# Patient Record
Sex: Female | Born: 1945 | Race: White | Hispanic: No | Smoking: Former smoker
Health system: Southern US, Community
[De-identification: ages and names within clinical notes are randomized; demographics above are authoritative.]

## PROBLEM LIST (undated history)

## (undated) DIAGNOSIS — I219 Acute myocardial infarction, unspecified: Secondary | ICD-10-CM

## (undated) DIAGNOSIS — I4891 Unspecified atrial fibrillation: Secondary | ICD-10-CM

## (undated) DIAGNOSIS — I255 Ischemic cardiomyopathy: Secondary | ICD-10-CM

## (undated) DIAGNOSIS — I739 Peripheral vascular disease, unspecified: Secondary | ICD-10-CM

## (undated) DIAGNOSIS — Z950 Presence of cardiac pacemaker: Secondary | ICD-10-CM

## (undated) DIAGNOSIS — E119 Type 2 diabetes mellitus without complications: Secondary | ICD-10-CM

## (undated) DIAGNOSIS — I5022 Chronic systolic (congestive) heart failure: Secondary | ICD-10-CM

## (undated) DIAGNOSIS — IMO0002 Reserved for concepts with insufficient information to code with codable children: Secondary | ICD-10-CM

## (undated) DIAGNOSIS — I2510 Atherosclerotic heart disease of native coronary artery without angina pectoris: Secondary | ICD-10-CM

## (undated) DIAGNOSIS — I10 Essential (primary) hypertension: Secondary | ICD-10-CM

## (undated) DIAGNOSIS — E785 Hyperlipidemia, unspecified: Secondary | ICD-10-CM

## (undated) DIAGNOSIS — E039 Hypothyroidism, unspecified: Secondary | ICD-10-CM

## (undated) DIAGNOSIS — K635 Polyp of colon: Secondary | ICD-10-CM

## (undated) HISTORY — PX: APPENDECTOMY: SHX54

## (undated) HISTORY — DX: Hypothyroidism, unspecified: E03.9

## (undated) HISTORY — DX: Presence of cardiac pacemaker: Z95.0

## (undated) HISTORY — PX: TONSILLECTOMY: SUR1361

## (undated) HISTORY — DX: Unspecified atrial fibrillation: I48.91

## (undated) HISTORY — PX: CARDIAC DEFIBRILLATOR PLACEMENT: SHX171

## (undated) HISTORY — DX: Atherosclerotic heart disease of native coronary artery without angina pectoris: I25.10

## (undated) HISTORY — DX: Type 2 diabetes mellitus without complications: E11.9

## (undated) HISTORY — DX: Polyp of colon: K63.5

## (undated) HISTORY — DX: Acute myocardial infarction, unspecified: I21.9

## (undated) HISTORY — PX: OTHER SURGICAL HISTORY: SHX169

## (undated) HISTORY — DX: Peripheral vascular disease, unspecified: I73.9

## (undated) HISTORY — DX: Hyperlipidemia, unspecified: E78.5

## (undated) HISTORY — DX: Reserved for concepts with insufficient information to code with codable children: IMO0002

## (undated) HISTORY — DX: Essential (primary) hypertension: I10

## (undated) HISTORY — DX: Chronic systolic (congestive) heart failure: I50.22

## (undated) HISTORY — DX: Ischemic cardiomyopathy: I25.5

---

## 1998-07-19 HISTORY — PX: FLEXIBLE SIGMOIDOSCOPY: SHX1649

## 1999-01-21 ENCOUNTER — Ambulatory Visit (HOSPITAL_COMMUNITY): Admission: RE | Admit: 1999-01-21 | Discharge: 1999-01-22 | Payer: Self-pay | Admitting: *Deleted

## 1999-04-14 HISTORY — PX: CORONARY ARTERY BYPASS GRAFT: SHX141

## 2000-07-26 ENCOUNTER — Encounter: Payer: Self-pay | Admitting: Family Medicine

## 2000-07-26 ENCOUNTER — Ambulatory Visit (HOSPITAL_COMMUNITY): Admission: RE | Admit: 2000-07-26 | Discharge: 2000-07-26 | Payer: Self-pay | Admitting: Family Medicine

## 2000-10-13 ENCOUNTER — Encounter: Payer: Self-pay | Admitting: Family Medicine

## 2000-10-13 ENCOUNTER — Ambulatory Visit (HOSPITAL_COMMUNITY): Admission: RE | Admit: 2000-10-13 | Discharge: 2000-10-13 | Payer: Self-pay | Admitting: Family Medicine

## 2001-09-16 ENCOUNTER — Observation Stay (HOSPITAL_COMMUNITY): Admission: EM | Admit: 2001-09-16 | Discharge: 2001-09-17 | Payer: Self-pay | Admitting: Family Medicine

## 2001-09-16 ENCOUNTER — Encounter: Payer: Self-pay | Admitting: Emergency Medicine

## 2005-02-25 ENCOUNTER — Ambulatory Visit: Payer: Self-pay | Admitting: Orthopedic Surgery

## 2005-03-18 ENCOUNTER — Ambulatory Visit: Payer: Self-pay | Admitting: Orthopedic Surgery

## 2005-05-06 ENCOUNTER — Encounter (HOSPITAL_COMMUNITY): Admission: RE | Admit: 2005-05-06 | Discharge: 2005-06-05 | Payer: Self-pay | Admitting: Endocrinology

## 2006-01-12 ENCOUNTER — Ambulatory Visit (HOSPITAL_COMMUNITY): Admission: RE | Admit: 2006-01-12 | Discharge: 2006-01-12 | Payer: Self-pay | Admitting: Family Medicine

## 2006-02-20 ENCOUNTER — Emergency Department (HOSPITAL_COMMUNITY): Admission: EM | Admit: 2006-02-20 | Discharge: 2006-02-20 | Payer: Self-pay | Admitting: Emergency Medicine

## 2006-03-17 ENCOUNTER — Ambulatory Visit: Payer: Self-pay | Admitting: Cardiology

## 2006-03-17 ENCOUNTER — Inpatient Hospital Stay (HOSPITAL_COMMUNITY): Admission: EM | Admit: 2006-03-17 | Discharge: 2006-03-19 | Payer: Self-pay | Admitting: Emergency Medicine

## 2006-03-23 ENCOUNTER — Ambulatory Visit: Payer: Self-pay | Admitting: Cardiology

## 2006-03-23 ENCOUNTER — Inpatient Hospital Stay (HOSPITAL_BASED_OUTPATIENT_CLINIC_OR_DEPARTMENT_OTHER): Admission: RE | Admit: 2006-03-23 | Discharge: 2006-03-23 | Payer: Self-pay | Admitting: Cardiology

## 2006-03-26 ENCOUNTER — Ambulatory Visit: Payer: Self-pay | Admitting: Cardiology

## 2006-04-01 ENCOUNTER — Ambulatory Visit: Payer: Self-pay | Admitting: Internal Medicine

## 2006-04-27 ENCOUNTER — Ambulatory Visit: Payer: Self-pay | Admitting: Cardiology

## 2006-06-07 ENCOUNTER — Ambulatory Visit: Payer: Self-pay | Admitting: Cardiology

## 2006-06-07 LAB — CONVERTED CEMR LAB
Calcium: 9.7 mg/dL (ref 8.4–10.5)
Chloride: 101 meq/L (ref 96–112)
Creatinine, Ser: 0.6 mg/dL (ref 0.4–1.2)
GFR calc Af Amer: 132 mL/min
Glucose, Bld: 191 mg/dL — ABNORMAL HIGH (ref 70–99)
Potassium: 4.5 meq/L (ref 3.5–5.1)
Sodium: 142 meq/L (ref 135–145)

## 2006-07-01 ENCOUNTER — Encounter: Payer: Self-pay | Admitting: Cardiology

## 2006-07-01 ENCOUNTER — Ambulatory Visit: Payer: Self-pay

## 2006-07-01 ENCOUNTER — Ambulatory Visit: Payer: Self-pay | Admitting: Cardiology

## 2006-07-01 LAB — CONVERTED CEMR LAB
CO2: 30 meq/L (ref 19–32)
Chloride: 100 meq/L (ref 96–112)
GFR calc Af Amer: 110 mL/min
GFR calc non Af Amer: 91 mL/min
Potassium: 4.1 meq/L (ref 3.5–5.1)
Sodium: 137 meq/L (ref 135–145)

## 2006-07-08 ENCOUNTER — Ambulatory Visit: Payer: Self-pay | Admitting: Internal Medicine

## 2006-10-14 ENCOUNTER — Ambulatory Visit: Payer: Self-pay | Admitting: Cardiology

## 2007-05-15 ENCOUNTER — Encounter: Payer: Self-pay | Admitting: Emergency Medicine

## 2007-05-16 ENCOUNTER — Ambulatory Visit: Payer: Self-pay | Admitting: Cardiovascular Disease

## 2007-05-16 ENCOUNTER — Encounter: Payer: Self-pay | Admitting: Cardiology

## 2007-05-16 ENCOUNTER — Inpatient Hospital Stay (HOSPITAL_COMMUNITY): Admission: EM | Admit: 2007-05-16 | Discharge: 2007-05-18 | Payer: Self-pay | Admitting: Cardiology

## 2007-05-25 ENCOUNTER — Ambulatory Visit: Payer: Self-pay

## 2007-06-21 ENCOUNTER — Ambulatory Visit: Payer: Self-pay | Admitting: Cardiology

## 2007-06-22 LAB — CONVERTED CEMR LAB
Basophils Relative: 1.7 % — ABNORMAL HIGH (ref 0.0–1.0)
CO2: 29 meq/L (ref 19–32)
Calcium: 9.5 mg/dL (ref 8.4–10.5)
Chloride: 105 meq/L (ref 96–112)
Eosinophils Absolute: 0.4 10*3/uL (ref 0.0–0.6)
MCV: 90.6 fL (ref 78.0–100.0)
Neutro Abs: 3.5 10*3/uL (ref 1.4–7.7)
Neutrophils Relative %: 53.4 % (ref 43.0–77.0)
Potassium: 4.3 meq/L (ref 3.5–5.1)
RDW: 12.7 % (ref 11.5–14.6)
WBC: 6.7 10*3/uL (ref 4.5–10.5)

## 2007-10-04 ENCOUNTER — Ambulatory Visit: Payer: Self-pay | Admitting: Internal Medicine

## 2007-10-25 ENCOUNTER — Ambulatory Visit: Payer: Self-pay | Admitting: Cardiology

## 2007-10-25 LAB — CONVERTED CEMR LAB
Basophils Relative: 0 % (ref 0.0–1.0)
Calcium: 9.7 mg/dL (ref 8.4–10.5)
Eosinophils Relative: 3.3 % (ref 0.0–5.0)
GFR calc Af Amer: 94 mL/min
HCT: 37.1 % (ref 36.0–46.0)
Lymphocytes Relative: 22 % (ref 12.0–46.0)
MCHC: 34.6 g/dL (ref 30.0–36.0)
MCV: 90.5 fL (ref 78.0–100.0)
Monocytes Relative: 4.7 % (ref 3.0–12.0)
Neutro Abs: 6.2 10*3/uL (ref 1.4–7.7)
Platelets: 279 10*3/uL (ref 150–400)
Pro B Natriuretic peptide (BNP): 124 pg/mL — ABNORMAL HIGH (ref 0.0–100.0)
RDW: 12.8 % (ref 11.5–14.6)

## 2008-01-19 ENCOUNTER — Ambulatory Visit: Payer: Self-pay | Admitting: Cardiology

## 2008-04-04 DIAGNOSIS — I10 Essential (primary) hypertension: Secondary | ICD-10-CM | POA: Insufficient documentation

## 2008-04-04 DIAGNOSIS — E785 Hyperlipidemia, unspecified: Secondary | ICD-10-CM

## 2008-04-04 DIAGNOSIS — E039 Hypothyroidism, unspecified: Secondary | ICD-10-CM

## 2008-04-04 DIAGNOSIS — E1159 Type 2 diabetes mellitus with other circulatory complications: Secondary | ICD-10-CM | POA: Insufficient documentation

## 2008-05-03 ENCOUNTER — Ambulatory Visit: Payer: Self-pay | Admitting: Cardiology

## 2008-05-03 ENCOUNTER — Encounter: Payer: Self-pay | Admitting: Internal Medicine

## 2008-05-14 ENCOUNTER — Ambulatory Visit: Payer: Self-pay | Admitting: Cardiology

## 2008-05-14 ENCOUNTER — Ambulatory Visit: Payer: Self-pay

## 2008-05-14 ENCOUNTER — Encounter: Payer: Self-pay | Admitting: Cardiology

## 2008-05-14 LAB — CONVERTED CEMR LAB
ALT: 17 units/L (ref 0–35)
Albumin: 3.9 g/dL (ref 3.5–5.2)
Basophils Absolute: 0.1 10*3/uL (ref 0.0–0.1)
Basophils Relative: 1.4 % (ref 0.0–3.0)
Bilirubin, Direct: 0.1 mg/dL (ref 0.0–0.3)
CO2: 31 meq/L (ref 19–32)
Cholesterol: 143 mg/dL (ref 0–200)
Creatinine, Ser: 0.7 mg/dL (ref 0.4–1.2)
Eosinophils Relative: 5 % (ref 0.0–5.0)
Glucose, Bld: 143 mg/dL — ABNORMAL HIGH (ref 70–99)
Hgb A1c MFr Bld: 8.8 % — ABNORMAL HIGH (ref 4.6–6.0)
LDL Cholesterol: 68 mg/dL (ref 0–99)
MCV: 91.2 fL (ref 78.0–100.0)
Monocytes Absolute: 0.5 10*3/uL (ref 0.1–1.0)
Platelets: 295 10*3/uL (ref 150–400)
Potassium: 4 meq/L (ref 3.5–5.1)
Pro B Natriuretic peptide (BNP): 193 pg/mL — ABNORMAL HIGH (ref 0.0–100.0)
RDW: 13.7 % (ref 11.5–14.6)
Sodium: 139 meq/L (ref 135–145)
Total CHOL/HDL Ratio: 3.7
Triglycerides: 178 mg/dL — ABNORMAL HIGH (ref 0–149)
WBC: 6.5 10*3/uL (ref 4.5–10.5)

## 2008-05-29 ENCOUNTER — Ambulatory Visit: Payer: Self-pay | Admitting: Endocrinology

## 2008-05-29 DIAGNOSIS — M79609 Pain in unspecified limb: Secondary | ICD-10-CM | POA: Insufficient documentation

## 2008-06-28 ENCOUNTER — Ambulatory Visit: Payer: Self-pay | Admitting: Endocrinology

## 2008-07-17 ENCOUNTER — Encounter (INDEPENDENT_AMBULATORY_CARE_PROVIDER_SITE_OTHER): Payer: Self-pay | Admitting: *Deleted

## 2008-07-31 ENCOUNTER — Ambulatory Visit: Payer: Self-pay | Admitting: Endocrinology

## 2008-07-31 LAB — CONVERTED CEMR LAB
Cholesterol: 142 mg/dL (ref 0–200)
Direct LDL: 60.5 mg/dL
HDL: 31.6 mg/dL — ABNORMAL LOW (ref >39.00)

## 2008-09-17 ENCOUNTER — Ambulatory Visit: Payer: Self-pay | Admitting: Endocrinology

## 2008-10-17 ENCOUNTER — Encounter: Payer: Self-pay | Admitting: Internal Medicine

## 2008-10-17 ENCOUNTER — Ambulatory Visit: Payer: Self-pay | Admitting: Internal Medicine

## 2008-10-17 DIAGNOSIS — I429 Cardiomyopathy, unspecified: Secondary | ICD-10-CM | POA: Insufficient documentation

## 2008-10-20 ENCOUNTER — Emergency Department (HOSPITAL_COMMUNITY): Admission: EM | Admit: 2008-10-20 | Discharge: 2008-10-21 | Payer: Self-pay | Admitting: Emergency Medicine

## 2008-10-23 ENCOUNTER — Encounter: Payer: Self-pay | Admitting: Endocrinology

## 2008-10-25 ENCOUNTER — Encounter (INDEPENDENT_AMBULATORY_CARE_PROVIDER_SITE_OTHER): Payer: Self-pay | Admitting: *Deleted

## 2008-10-26 ENCOUNTER — Encounter: Payer: Self-pay | Admitting: Endocrinology

## 2008-11-13 ENCOUNTER — Encounter: Payer: Self-pay | Admitting: Endocrinology

## 2008-12-05 DIAGNOSIS — I2510 Atherosclerotic heart disease of native coronary artery without angina pectoris: Secondary | ICD-10-CM

## 2008-12-06 ENCOUNTER — Ambulatory Visit: Payer: Self-pay | Admitting: Cardiology

## 2008-12-06 DIAGNOSIS — Z6835 Body mass index (BMI) 35.0-35.9, adult: Secondary | ICD-10-CM

## 2008-12-23 ENCOUNTER — Encounter: Payer: Self-pay | Admitting: Endocrinology

## 2008-12-24 ENCOUNTER — Telehealth (INDEPENDENT_AMBULATORY_CARE_PROVIDER_SITE_OTHER): Payer: Self-pay | Admitting: *Deleted

## 2008-12-24 ENCOUNTER — Encounter: Payer: Self-pay | Admitting: Endocrinology

## 2008-12-27 ENCOUNTER — Ambulatory Visit (HOSPITAL_COMMUNITY): Admission: RE | Admit: 2008-12-27 | Discharge: 2008-12-27 | Payer: Self-pay | Admitting: Family Medicine

## 2009-01-23 ENCOUNTER — Ambulatory Visit: Payer: Self-pay | Admitting: Vascular Surgery

## 2009-03-21 ENCOUNTER — Ambulatory Visit: Payer: Self-pay | Admitting: Cardiology

## 2009-03-21 ENCOUNTER — Encounter: Payer: Self-pay | Admitting: Internal Medicine

## 2009-04-10 ENCOUNTER — Telehealth (INDEPENDENT_AMBULATORY_CARE_PROVIDER_SITE_OTHER): Payer: Self-pay | Admitting: *Deleted

## 2009-06-05 ENCOUNTER — Ambulatory Visit: Payer: Self-pay | Admitting: Cardiology

## 2009-06-05 DIAGNOSIS — Z950 Presence of cardiac pacemaker: Secondary | ICD-10-CM

## 2009-06-11 ENCOUNTER — Telehealth: Payer: Self-pay | Admitting: Cardiology

## 2009-06-24 ENCOUNTER — Ambulatory Visit: Payer: Self-pay | Admitting: Endocrinology

## 2009-06-24 LAB — CONVERTED CEMR LAB: Hgb A1c MFr Bld: 7.5 % — ABNORMAL HIGH (ref 4.6–6.5)

## 2009-06-25 ENCOUNTER — Telehealth: Payer: Self-pay | Admitting: Endocrinology

## 2009-07-02 LAB — CONVERTED CEMR LAB
CO2: 23 meq/L (ref 19–32)
Calcium: 9.7 mg/dL (ref 8.4–10.5)
Creatinine, Ser: 0.86 mg/dL (ref 0.40–1.20)
Glucose, Bld: 220 mg/dL — ABNORMAL HIGH (ref 70–99)
MCV: 94.2 fL (ref 78.0–100.0)
Platelets: 327 10*3/uL (ref 150–400)
RBC: 4.11 M/uL (ref 3.87–5.11)

## 2009-07-04 ENCOUNTER — Encounter (INDEPENDENT_AMBULATORY_CARE_PROVIDER_SITE_OTHER): Payer: Self-pay | Admitting: *Deleted

## 2009-07-29 ENCOUNTER — Telehealth: Payer: Self-pay | Admitting: Endocrinology

## 2009-08-07 ENCOUNTER — Encounter: Payer: Self-pay | Admitting: Internal Medicine

## 2009-08-07 ENCOUNTER — Ambulatory Visit: Payer: Self-pay | Admitting: Cardiology

## 2009-09-15 ENCOUNTER — Encounter: Payer: Self-pay | Admitting: Endocrinology

## 2009-10-08 ENCOUNTER — Telehealth: Payer: Self-pay | Admitting: Endocrinology

## 2009-10-29 ENCOUNTER — Ambulatory Visit: Payer: Self-pay | Admitting: Internal Medicine

## 2009-11-26 ENCOUNTER — Encounter: Payer: Self-pay | Admitting: Endocrinology

## 2009-12-02 ENCOUNTER — Telehealth: Payer: Self-pay | Admitting: Endocrinology

## 2009-12-09 ENCOUNTER — Ambulatory Visit: Payer: Self-pay | Admitting: Endocrinology

## 2010-02-13 ENCOUNTER — Telehealth: Payer: Self-pay | Admitting: Endocrinology

## 2010-05-04 ENCOUNTER — Encounter: Payer: Self-pay | Admitting: Family Medicine

## 2010-05-05 ENCOUNTER — Encounter: Payer: Self-pay | Admitting: Family Medicine

## 2010-05-11 LAB — CONVERTED CEMR LAB
Basophils Absolute: 0.1 10*3/uL (ref 0.0–0.1)
Basophils Relative: 0.9 % (ref 0.0–3.0)
CO2: 31 meq/L (ref 19–32)
Creatinine, Ser: 0.7 mg/dL (ref 0.4–1.2)
Eosinophils Absolute: 0.2 10*3/uL (ref 0.0–0.7)
Eosinophils Relative: 2.8 % (ref 0.0–5.0)
GFR calc non Af Amer: 90.27 mL/min (ref >60)
Lymphocytes Relative: 26.3 % (ref 12.0–46.0)
MCV: 91.2 fL (ref 78.0–100.0)
Potassium: 4.5 meq/L (ref 3.5–5.1)
RBC: 3.92 M/uL (ref 3.87–5.11)
RDW: 13 % (ref 11.5–14.6)

## 2010-05-13 NOTE — Assessment & Plan Note (Signed)
Summary: f59m  Medications Added FENOFIBRATE 160 MG TABS (FENOFIBRATE) take one tablet at bedtime MULTIVITAMINS  TABS (MULTIPLE VITAMIN) take one tablet once daily      Allergies Added: NKDA  Referring Provider:  Dr. Gerda Diss Primary Provider:  Dr. Lubertha South  CC:  follow up 6 months.  Pt feeling pretty well.  Has questions about Naproxen Sodium.  Wants to know if this is safe for her to take.  [name] [name]  History of Present Illness: The patient is 65 years old and return for management of CAD and CHF. She had bypass surgery in 2001 at [facility]. In 2009 she presented with congestive heart failure and was found to have an ejection fraction of 25%. She had catheterization which showed patent grafts. She then underwent implantation of an ICD and biventricular pacemaker. Her ejection fraction improved from 25% to 40-45%.  She has done well over the last 6 months. She's had no chest pain shortness of breath or palpitations.  Her past medical history is significant for insulin insulin-dependent diabetes, hyperlipidemia and excess weight. Her weight was 255 pounds today.    Current Medications (verified): 1)  Furosemide 40 Mg Tabs (Furosemide) .... Take 1 Two Times A Day 2)  Lantus 100 Unit/ml Soln (Insulin Glargine) .... Take 30 Units Qhs 3)  Humalog 100 Unit/ml Soln (Insulin Lispro (Human)) .... As Directed 4)  Synthroid 100 Mcg Tabs (Levothyroxine Sodium) .... Take 1 By Mouth Qd 5)  Coreg 25 Mg Tabs (Carvedilol) .... Take 1 Two Times A Day 6)  Klor-Con M20 20 Meq Cr-Tabs (Potassium Chloride Crys Cr) .... Take 1 Two Times A Day 7)  Enalapril Maleate 10 Mg Tabs (Enalapril Maleate) .... Take 1 Two Times A Day 8)  Simvastatin 80 Mg Tabs (Simvastatin) .... Qhs 9)  Aspirin 81 Mg Tbec (Aspirin) .... Take One Tablet By Mouth Daily 10)  Fish Oil   Oil (Fish Oil) .... Once Daily 11)  Vitamin E 600 Unit  Caps (Vitamin E) .... Once Daily 12)  Flax   Oil (Flaxseed (Linseed)) .... Once Daily 13)   Fenofibrate 160 Mg Tabs (Fenofibrate) .... Take One Tablet At Bedtime 14)  Multivitamins  Tabs (Multiple Vitamin) .... Take One Tablet Once Daily  Allergies (verified): No Known Drug Allergies  Past History:  Past Medical History: Reviewed history from 06/05/2009 and no changes required. Congestive heart failure Diabetes mellitus, type II Hyperlipidemia Hypertension Hypothyroidism Peripheral vascular disease 1. Coronary artery disease, status post coronary artery bypass graft     surgery in 2001 with patent grafts in 2007. 2. Ischemic cardiomyopathy, ejection fraction of 25% improved to 45-     50% following biventricular pacing. 3. Class I-II congestive heart failure, currently euvolemic. 4. Status post biventricular dual-chamber pacemaker and implantable     cardioverter-defibrillator implantation.-[employer] Livian H22 5. Insulin-dependent diabetes. 6. Hyperlipidemia.   Review of Systems       ROS is negative except as outlined in HPI.   Vital Signs:  Patient profile:   65 year old female Height:      65 inches Weight:      257 pounds BMI:     42.92 Pulse rate:   79 / minute Pulse rhythm:   irregular BP sitting:   119 / 57  (left arm) Cuff size:   large  Vitals Entered By: Judithe Modest CMA (June 05, 2009 4:20 PM)  Physical Exam  Additional Exam:  Gen. Well-nourished, in no distress   Neck: JVP 1 cm above the  clavicle, thyroid not enlarged, no carotid bruits Lungs: No tachypnea, clear without rales, rhonchi or wheezes Cardiovascular: Rhythm regular, PMI not displaced,  heart sounds  normal, grade 2/6 systolic murmur at the left sternal edge, trace to 1+ lower extremity edema, pulses normal in all 4 extremities. Abdomen: BS normal, abdomen soft and non-tender without masses or organomegaly, no hepatosplenomegaly. MS: No deformities, no cyanosis or clubbing   Neuro:  No focal sns   Skin:  no lesions     ICD Specifications Following MD:  Lewayne Bunting, MD     ICD Vendor:  [facility] [employer]     ICD Model Number:  H220     ICD Serial Number:  [id number] ICD DOI:  05/16/2007     ICD Implanting MD:  Lewayne Bunting, MD  Lead 1:    Location: RA     DOI: 05/16/2007     Model #: [id number]     Serial #: [id number]     Status: active Lead 2:    Location: RV     DOI: 05/16/2007     Model #: [id number]     Serial #: [id number]     Status: active Lead 3:    Location: LV     DOI: 05/16/2007     Model #: [id number]     Serial #: [id number]     Status: active  Indications::  CHF, ICM   ICD Follow Up ICD Dependent:  Yes      Episodes Coumadin:  No  Brady Parameters Mode 175     Lower Rate Limit:  60     Upper Rate Limit 120 PAV 180      Tachy Zones VF:  240     VT:  200     VT1:  175     Impression & Recommendations:  Problem # 1:  CHRONIC SYSTOLIC HEART FAILURE (ICD-428.22) She has a history of systolic heart failure but her ejection fraction improved from 25% to 40-45% following biventricular pacing. She has mild volume overload today with slight JVD and slight edema. We will increase her Lasix to 80 mg a.m. and 40 mg p.m. for 2 days and then resume 40 mg b.i.d. We will get a BMP. Her updated medication list for this problem includes:    Furosemide 40 Mg Tabs (Furosemide) .[name] [name]... Take 1 two times a day    Coreg 25 Mg Tabs (Carvedilol) .[name] [name]... Take 1 two times a day    Enalapril Maleate 10 Mg Tabs (Enalapril maleate) .[name] [name]... Take 1 two times a day    Aspirin 81 Mg Tbec (Aspirin) .[name] [name]... Take one tablet by mouth daily  Problem # 2:  CAD, AUTOLOGOUS BYPASS GRAFT (ICD-414.02)  She had bypass surgery and 2001 and had patent grafts in 2009. She's had no chest pain his palm appears stable. Her updated medication list for this problem includes:    Coreg 25 Mg Tabs (Carvedilol) .[name] [name]... Take 1 two times a day    Enalapril Maleate 10 Mg Tabs (Enalapril maleate) .[name] [name]... Take 1 two times a day    Aspirin 81 Mg Tbec (Aspirin) .[name] [name]... Take one tablet by mouth daily  Her updated medication list  for this problem includes:    Coreg 25 Mg Tabs (Carvedilol) .[name] [name]... Take 1 two times a day    Enalapril Maleate 10 Mg Tabs (Enalapril maleate) .[name] [name]... Take 1 two times a day    Aspirin 81 Mg Tbec (Aspirin) .[name] [name]... Take one tablet by mouth daily  Orders: EKG  w/ Interpretation (93000)  Problem # 3:  AUTOMATIC IMPLANTABLE CARDIAC DEFIBRILLATOR SITU (ICD-V45.02) She is scheduled to have her pacemaker checked next month by EP.  Problem # 4:  MORBID OBESITY (ICD-278.01) She was considering a lap band surgery but found that Medicaid would not pay for this. She plans to pursue this further. This might be most helpful for her.  Patient Instructions: 1)  Your physician recommends that you have  lab work on the next couple of days: bmet/cbc (428.22;414.01) 2)  Your physician has recommended you make the following change in your medication: 1) Increase lasix to 40mg  two tabs in the morning and one tab in the evening for the next 2 days, then return to your current lasix dose. 3)  Your physician wants you to follow-up in: 6 months.  You will receive a reminder letter in the mail two months in advance. If you don't receive a letter, please call our office to schedule the follow-up appointment.

## 2010-05-13 NOTE — Assessment & Plan Note (Signed)
Summary: f/u appt per pt/#/cd   Vital Signs:  Patient profile:   65 year old female Height:      65 inches (165.10 cm) Weight:      258.38 pounds (117.45 kg) O2 Sat:      97 % on Room air Temp:     97.1 degrees F (36.17 degrees C) oral Pulse rate:   76 / minute BP sitting:   120 / 68  (left arm) Cuff size:   large  Vitals Entered By: Josph Macho RMA (June 24, 2009 2:36 PM)  O2 Flow:  Room air CC: Follow-up visit/ CF Is Patient Diabetic? Yes   Referring Provider:  Dr. Gerda Diss Primary Provider:  Dr. Lubertha South  CC:  Follow-up visit/ CF.  History of Present Illness: pt says she still has hypoglycemia in the mid-morning.  she says this is due to exertion.  it is highest at hs (200's).   pt states she feels well in general.  Current Medications (verified): 1)  Furosemide 40 Mg Tabs (Furosemide) .... Take 1 Two Times A Day 2)  Lantus 100 Unit/ml Soln (Insulin Glargine) .... Take 30 Units Qhs 3)  Humalog 100 Unit/ml Soln (Insulin Lispro (Human)) .... As Directed 4)  Synthroid 100 Mcg Tabs (Levothyroxine Sodium) .... Take 1 By Mouth Qd 5)  Coreg 25 Mg Tabs (Carvedilol) .... Take 1 Two Times A Day 6)  Klor-Con M20 20 Meq Cr-Tabs (Potassium Chloride Crys Cr) .... Take 1 Two Times A Day 7)  Enalapril Maleate 10 Mg Tabs (Enalapril Maleate) .... Take 1 Two Times A Day 8)  Simvastatin 80 Mg Tabs (Simvastatin) .... Qhs 9)  Aspirin 81 Mg Tbec (Aspirin) .... Take One Tablet By Mouth Daily 10)  Fish Oil   Oil (Fish Oil) .... Once Daily 11)  Vitamin E 600 Unit  Caps (Vitamin E) .... Once Daily 12)  Flax   Oil (Flaxseed (Linseed)) .... Once Daily 13)  Fenofibrate 160 Mg Tabs (Fenofibrate) .... Take One Tablet At Bedtime 14)  Multivitamins  Tabs (Multiple Vitamin) .... Take One Tablet Once Daily  Allergies (verified): No Known Drug Allergies  Past History:  Past Medical History: Last updated: 06/05/2009 Congestive heart failure Diabetes mellitus, type  II Hyperlipidemia Hypertension Hypothyroidism Peripheral vascular disease 1. Coronary artery disease, status post coronary artery bypass graft     surgery in 2001 with patent grafts in 2007. 2. Ischemic cardiomyopathy, ejection fraction of 25% improved to 45-     50% following biventricular pacing. 3. Class I-II congestive heart failure, currently euvolemic. 4. Status post biventricular dual-chamber pacemaker and implantable     cardioverter-defibrillator implantation.-[employer] Livian H22 5. Insulin-dependent diabetes. 6. Hyperlipidemia.   Review of Systems  The patient denies syncope.    Physical Exam  General:  morbidly obese.   Pulses:  dorsalis pedis intact bilat.   Extremities:  no deformity.  no ulcer on the feet.  feet are of normal color and temp.  the right great toe is surgically absent.  the left great toe has onychomycosis.  there is a heave callous on the plantar aspect of the right great toe. trace right pedal edema and trace left pedal edema.   Neurologic:  sensation is intact to touch on the feet.  Additional Exam:  a1c=8.8 last month (pt says was 5.5 last week).  today: Hemoglobin A1C= 7.5 %    Impression & Recommendations:  Problem # 1:  DIABETES MELLITUS, TYPE II (ICD-250.00) Assessment Improved needs slightly increased rx  Medications  Added to Medication List This Visit: 1)  Lantus 100 Unit/ml Soln (Insulin glargine) .... Take 20 units qhs 2)  Humalog 100 Unit/ml Soln (Insulin lispro (human)) .... Three times a day (just before each meal) 60-70-90 units  Other Orders: TLB-A1C / Hgb A1C (Glycohemoglobin) (83036-A1C) Est. Patient Level III ([address])  Patient Instructions: 1)  decrease lantus to 20 units at bedtime 2)  increase humalog to (just before each meal) 60-70-90.  if exercise is anticipated before the next meal, subtract 20 units from that injection.   3)  Please schedule a follow-up appointment in 3 months.   4)  check your blood  sugar 2 times a day.  vary the time of day when you check, between before the 3 meals, and at bedtime.  also check if you have symptoms of your blood sugar being too high or too low.  please keep a record of the readings and bring it to your next appointment here.  please call us sooner if you are having low blood sugar episodes. 5)  (update: i left message on phone-tree:  rx as we discussed)

## 2010-05-13 NOTE — Progress Notes (Signed)
Summary: OV due  Phone Note Outgoing Call Call back at [phone]   Call placed by: Brenton Grills MA,  December 02, 2009 4:43 PM Call placed to: Per MD, Patient Summary of Call: Per MD, pt is due for OV left message for pt to callback appt has been scheduled for 12/09/09 at 1pm. Initial call taken by: Daphane Shepherd,  December 03, 2009 11:02 AM  Follow-up for Phone Call        left message for pt to callback office Follow-up by: Brenton Grills MA,  December 03, 2009 11:00 AM

## 2010-05-13 NOTE — Cardiovascular Report (Signed)
Summary: Office Visit   Office Visit   Imported By: Roderic Ovens 08/27/2009 11:31:31  _____________________________________________________________________  External Attachment:    Type:   Image     Comment:   External Document

## 2010-05-13 NOTE — Assessment & Plan Note (Signed)
Summary: follow up per md-lb   Vital Signs:  Patient profile:   65 year old female Height:      65 inches (165.10 cm) Weight:      264 pounds (120.00 kg) BMI:     44.09 O2 Sat:      97 % on Room air Temp:     96.6 degrees F (35.89 degrees C) oral Pulse rate:   64 / minute BP sitting:   132 / 70  (left arm) Cuff size:   large  Vitals Entered By: Brenton Grills MA (December 09, 2009 1:24 PM)  O2 Flow:  Room air CC: F/U appt/aj Is Patient Diabetic? Yes   Referring Provider:  Dr. Gerda Diss Primary Provider:  Dr. Lubertha South  CC:  F/U appt/aj.  History of Present Illness: pt states she feels well in general.  no cbg record, but states cbg's are highest highest at hs, and in am (200's).  it is lower at other times of day.    Current Medications (verified): 1)  Furosemide 40 Mg Tabs (Furosemide) .... Take 1 Two Times A Day 2)  Lantus 100 Unit/ml Soln (Insulin Glargine) .... Take 20 Units Qhs 3)  Humalog 100 Unit/ml Soln (Insulin Lispro (Human)) .... Three Times A Day (Just Before Each Meal) 60-70-90 Units 4)  Synthroid 100 Mcg Tabs (Levothyroxine Sodium) .... Take 1 By Mouth Qd 5)  Coreg 25 Mg Tabs (Carvedilol) .... Take 1 Two Times A Day 6)  Klor-Con M20 20 Meq Cr-Tabs (Potassium Chloride Crys Cr) .... Take 1 Two Times A Day 7)  Enalapril Maleate 10 Mg Tabs (Enalapril Maleate) .... Take 1 Two Times A Day 8)  Simvastatin 80 Mg Tabs (Simvastatin) .... Qhs 9)  Aspirin 81 Mg Tbec (Aspirin) .... Take One Tablet By Mouth Daily 10)  Fish Oil   Oil (Fish Oil) .... Once Daily 11)  Vitamin E 600 Unit  Caps (Vitamin E) .... Once Daily 12)  Fenofibrate 160 Mg Tabs (Fenofibrate) .... Take One Tablet At Bedtime 13)  Multivitamins  Tabs (Multiple Vitamin) .... Take One Tablet Once Daily  Allergies (verified): 1)  * Clopidogrel  Review of Systems  The patient denies hypoglycemia.    Physical Exam  General:  morbidly obese.  no distress  Pulses:  dorsalis pedis intact bilat.     Extremities:  no deformity.  no ulcer on the feet.  feet are of normal color and temp.  the right great toe is surgically absent.  the left great toe has onychomycosis.  there is a heavy callous on the plantar aspect of the right great toe. trace right pedal edema and 2+ left pedal edema.   Neurologic:  sensation is intact to touch on the feet.  Additional Exam:  outside test results are reviewed:  a1c=8.0   Impression & Recommendations:  Problem # 1:  DIABETES MELLITUS, TYPE II (ICD-250.00) needs increased rx  Medications Added to Medication List This Visit: 1)  Lantus 100 Unit/ml Soln (Insulin glargine) .... Take 10 units at bedtime 2)  Humalog 100 Unit/ml Soln (Insulin lispro (human)) .... Three times a day (just before each meal) 60-70-110 units  Other Orders: Est. Patient Level III (95621)  Patient Instructions: 1)  decrease lantus to 10 units at bedtime 2)  increase humalog to (just before each meal) 60-70-110.  if exercise is anticipated before the next meal, subtract 20 units from that injection.   3)  Please schedule a follow-up appointment in 6 weeks.   4)  check your blood sugar 2 times a day.  vary the time of day when you check, between before the 3 meals, and at bedtime.  also check if you have symptoms of your blood sugar being too high or too low.  please keep a record of the readings and bring it to your next appointment here.  please call us sooner if you are having low blood sugar episodes.

## 2010-05-13 NOTE — Letter (Signed)
Summary: Appointment - Missed  [facility] [place]  [address] [place] [address]   Phone: [phone]  Fax: [phone]     July 04, 2009 MRN: [id number]   [name] [name] [address] [place], [place]  [address]   Dear [name],  Our records indicate you missed your appointment on 07/04/09 [facility]                            It is very important that we reach you to reschedule this appointment. We look forward to participating in your health care needs. Please contact us at the number listed above at your earliest convenience to reschedule this appointment.     Sincerely,    Glass blower/designer

## 2010-05-13 NOTE — Cardiovascular Report (Signed)
Summary: Office Visit   Office Visit   Imported By: Roderic Ovens 04/18/2009 15:18:16  _____________________________________________________________________  External Attachment:    Type:   Image     Comment:   External Document

## 2010-05-13 NOTE — Progress Notes (Signed)
Summary: lost order for lab work   Phone Note Call from Patient Call back at [employer] [phone]   Caller: Patient Reason for Call: Talk to Nurse, Lab or Test Results Details for Reason: pt lost order to have lab work drawn in [place] office.would like another rx sent to her.  Initial call taken by: Lorne Skeens,  June 11, 2009 3:46 PM  Follow-up for Phone Call        I called and left the pt a message that her order for labs is coming to her in the mail.  Follow-up by: Sherri Rad, RN, BSN,  June 11, 2009 4:37 PM

## 2010-05-13 NOTE — Medication Information (Signed)
Summary: [facility] care [facility]  [facility]   Imported By: Lester George 09/18/2009 09:55:53  _____________________________________________________________________  External Attachment:    Type:   Image     Comment:   External Document

## 2010-05-13 NOTE — Assessment & Plan Note (Signed)
Summary: 3 mth f/u per checkout on 08/07/09/tg      Allergies Added:   Referring Provider:  Dr. Gerda Diss Primary Provider:  Dr. Lubertha South   History of Present Illness: [name] [name] returns today for followup.  She is a morbidly obese woman with a h/o an ICM s/p MI, s/p CABG in 2001.  The patient developed CHF and LBBB and is s/p BiV ICD.  She has had difficulty keeping off weight and was interested in pursuing bariatric surgery but could not afford the cost.  She denies c/p.  Her CHF is class 2.  Current Medications (verified): 1)  Furosemide 40 Mg Tabs (Furosemide) .... Take 1 Two Times A Day 2)  Lantus 100 Unit/ml Soln (Insulin Glargine) .... Take 20 Units Qhs 3)  Humalog 100 Unit/ml Soln (Insulin Lispro (Human)) .... Three Times A Day (Just Before Each Meal) 60-70-90 Units 4)  Synthroid 100 Mcg Tabs (Levothyroxine Sodium) .... Take 1 By Mouth Qd 5)  Coreg 25 Mg Tabs (Carvedilol) .... Take 1 Two Times A Day 6)  Klor-Con M20 20 Meq Cr-Tabs (Potassium Chloride Crys Cr) .... Take 1 Two Times A Day 7)  Enalapril Maleate 10 Mg Tabs (Enalapril Maleate) .... Take 1 Two Times A Day 8)  Simvastatin 80 Mg Tabs (Simvastatin) .... Qhs 9)  Aspirin 81 Mg Tbec (Aspirin) .... Take One Tablet By Mouth Daily 10)  Fish Oil   Oil (Fish Oil) .... Once Daily 11)  Vitamin E 600 Unit  Caps (Vitamin E) .... Once Daily 12)  Fenofibrate 160 Mg Tabs (Fenofibrate) .... Take One Tablet At Bedtime 13)  Multivitamins  Tabs (Multiple Vitamin) .... Take One Tablet Once Daily  Allergies (verified): 1)  * Clopidogrel  Past History:  Past Medical History: Last updated: 06/05/2009 Congestive heart failure Diabetes mellitus, type II Hyperlipidemia Hypertension Hypothyroidism Peripheral vascular disease 1. Coronary artery disease, status post coronary artery bypass graft     surgery in 2001 with patent grafts in 2007. 2. Ischemic cardiomyopathy, ejection fraction of 25% improved to 45-     50% following  biventricular pacing. 3. Class I-II congestive heart failure, currently euvolemic. 4. Status post biventricular dual-chamber pacemaker and implantable     cardioverter-defibrillator implantation.-[employer] Livian H22 5. Insulin-dependent diabetes. 6. Hyperlipidemia.   Past Surgical History: Last updated: 06/05/2009 four vessel CABG-2001 Appendectomy Status post bilateral tubal ligation AICD-[employer] Livian  Review of Systems  The patient denies chest pain, syncope, dyspnea on exertion, and peripheral edema.    Vital Signs:  Patient profile:   65 year old female Height:      65 inches Weight:      261 pounds BMI:     43.59 Pulse rate:   69 / minute Resp:     16 per minute BP sitting:   106 / 58  (right arm)  Vitals Entered By: [name] Coy, CNA (October 29, 2009 9:05 AM)  Physical Exam  General:  morbidly obese.   Head:  normocephalic and atraumatic Eyes:  PERRLA/EOM intact; conjunctiva and lids normal. Mouth:  Teeth, gums and palate normal. Oral mucosa normal. Neck:  Neck supple, no JVD. No masses, thyromegaly or abnormal cervical nodes. Chest Wall:  Well healed ICD incision. Lungs:  Rales in the bases but no wheezes, rales, or rhonchi.  No increased work of breathing. Heart:  RRR with normal S1 and S2.  PMI is enlarged and laterally displaced.  No murmur. Abdomen:  Bowel sounds positive; abdomen soft and non-tender without masses, organomegaly,  or hernias noted. No hepatosplenomegaly. Morbidly obese. Msk:  Back normal, normal gait. Muscle strength and tone normal. Pulses:  dorsalis pedis intact bilat.   Neurologic:  sensation is intact to touch on the feet. alert and oriented times 3.     ICD Specifications Following MD:  [name] Bunting, MD     ICD Vendor:  [facility] [employer]     ICD Model Number:  H220     ICD Serial Number:  [id number] ICD DOI:  05/16/2007     ICD Implanting MD:  [name] Bunting, MD  Lead 1:    Location: RA     DOI: 05/16/2007     Model #:  4135     Serial #: [id number]     Status: active Lead 2:    Location: RV     DOI: 05/16/2007     Model #: 0157     Serial #: [id number]     Status: active Lead 3:    Location: LV     DOI: 05/16/2007     Model #: 3086     Serial #: [id number]     Status: active  Indications::  CHF, ICM   ICD Follow Up Remote Check?  No Battery Voltage:  2.94 V     Charge Time:  7.4 seconds     Battery Est. Longevity:  75% Underlying rhythm:  dependent ICD Dependent:  Yes       ICD Device Measurements Atrium:  Amplitude: 2.5 mV, Impedance: 535 ohms, Threshold: 1.4 V at 0.4 msec Right Ventricle:  Impedance: 740 ohms, Threshold: 1.0 V at 0.4 msec Left Ventricle:  Impedance: 722 ohms, Threshold: 0.8 V at 0.5 msec Shock Impedance: 44 ohms   Episodes MS Episodes:  0     Percent Mode Switch:  0     Coumadin:  No Shock:  0     ATP:  0     Nonsustained:  0     Atrial Pacing:  29%     Ventricular Pacing:  90%  Brady Parameters Mode 175     Lower Rate Limit:  60     Upper Rate Limit 120 PAV 180      Tachy Zones VF:  240     VT:  200     VT1:  175     Next Cardiology Appt Due:  01/11/2010 Tech Comments:  No parameter  changes. Device function normal.  ROV 3 months [facility] clinic. [name] Harm, LPN  October 29, 2009 9:22 AM  MD Comments:  Agree with above.  Impression & Recommendations:  Problem # 1:  CHRONIC SYSTOLIC HEART FAILURE (ICD-428.22) her symptoms are class 2.  I have asked her to continue her current meds, maintain a low sodium diet and exercise. Her updated medication list for this problem includes:    Furosemide 40 Mg Tabs (Furosemide) .Marland Kitchen... Take 1 two times a day    Coreg 25 Mg Tabs (Carvedilol) .Marland Kitchen... Take 1 two times a day    Enalapril Maleate 10 Mg Tabs (Enalapril maleate) .Marland Kitchen... Take 1 two times a day    Aspirin 81 Mg Tbec (Aspirin) .Marland Kitchen... Take one tablet by mouth daily  Problem # 2:  AUTOMATIC IMPLANTABLE CARDIAC DEFIBRILLATOR SITU (ICD-V45.02) her device is working normally.  Will recheck in several  months.  Problem # 3:  HYPERTENSION (ICD-401.9) Her pressures are well controlled.  She will continue her meds as below. Her updated medication list for this problem includes:    Furosemide 40  Mg Tabs (Furosemide) .Marland Kitchen... Take 1 two times a day    Coreg 25 Mg Tabs (Carvedilol) .Marland Kitchen... Take 1 two times a day    Enalapril Maleate 10 Mg Tabs (Enalapril maleate) .Marland Kitchen... Take 1 two times a day    Aspirin 81 Mg Tbec (Aspirin) .Marland Kitchen... Take one tablet by mouth daily

## 2010-05-13 NOTE — Procedures (Signed)
Summary: PC2      Allergies Added: * CLOPIDOGREL  Current Medications (verified): 1)  Furosemide 40 Mg Tabs (Furosemide) .... Take 1 Two Times A Day 2)  Lantus 100 Unit/ml Soln (Insulin Glargine) .... Take 20 Units Qhs 3)  Humalog 100 Unit/ml Soln (Insulin Lispro (Human)) .... Three Times A Day (Just Before Each Meal) 60-70-90 Units 4)  Synthroid 100 Mcg Tabs (Levothyroxine Sodium) .... Take 1 By Mouth Qd 5)  Coreg 25 Mg Tabs (Carvedilol) .... Take 1 Two Times A Day 6)  Klor-Con M20 20 Meq Cr-Tabs (Potassium Chloride Crys Cr) .... Take 1 Two Times A Day 7)  Enalapril Maleate 10 Mg Tabs (Enalapril Maleate) .... Take 1 Two Times A Day 8)  Simvastatin 80 Mg Tabs (Simvastatin) .... Qhs 9)  Aspirin 81 Mg Tbec (Aspirin) .... Take One Tablet By Mouth Daily 10)  Fish Oil   Oil (Fish Oil) .... Once Daily 11)  Vitamin E 600 Unit  Caps (Vitamin E) .... Once Daily 12)  Fenofibrate 160 Mg Tabs (Fenofibrate) .... Take One Tablet At Bedtime 13)  Multivitamins  Tabs (Multiple Vitamin) .... Take One Tablet Once Daily  Allergies (verified): 1)  * Clopidogrel    ICD Specifications Following MD:  Lewayne Bunting, MD     ICD Vendor:  [facility] [employer]     ICD Model Number:  H220     ICD Serial Number:  [id number] ICD DOI:  05/16/2007     ICD Implanting MD:  Lewayne Bunting, MD  Lead 1:    Location: RA     DOI: 05/16/2007     Model #: [id number]     Serial #: [id number]     Status: active Lead 2:    Location: RV     DOI: 05/16/2007     Model #: [id number]     Serial #: [id number]     Status: active Lead 3:    Location: LV     DOI: 05/16/2007     Model #: [id number]     Serial #: [id number]     Status: active  Indications::  CHF, ICM   ICD Follow Up Remote Check?  No Battery Voltage:  2.92 V     Charge Time:  7.4 seconds     Battery Est. Longevity:  bol Underlying rhythm:  DEPENDENT ICD Dependent:  Yes       ICD Device Measurements Atrium:  Amplitude: 2.1 mV, Impedance: 535 ohms, Threshold: 1.4 V at 0.4 msec Right Ventricle:   Impedance: 699 ohms, Threshold: 0.8 V at 0.4 msec Left Ventricle:  Impedance: 722 ohms, Threshold: 0.8 V at 0.5 msec Shock Impedance: 43 ohms   Episodes MS Episodes:  0     Percent Mode Switch:  0     Coumadin:  No Shock:  0     ATP:  0     Nonsustained:  0     Atrial Pacing:  16%     Ventricular Pacing:  90%  Brady Parameters Mode 175     Lower Rate Limit:  60     Upper Rate Limit 120 PAV 180      Tachy Zones VF:  240     VT:  200     VT1:  175     Next Cardiology Appt Due:  10/11/2009 Tech Comments:  RA reprogrammed 2.6@0 .4.  ROV 3 months with Dr. Ladona Ridgel in [place]. Altha Harm, LPN  August 07, 2009 10:18 AM  MD Comments:  Agree with above.

## 2010-05-13 NOTE — Progress Notes (Signed)
  Phone Note Refill Request Message from:  Fax from Pharmacy on July 29, 2009 12:50 PM  Refills Requested: Medication #1:  SIMVASTATIN 80 MG TABS qhs   Dosage confirmed as above?Dosage Confirmed Initial call taken by: Josph Macho RMA,  July 29, 2009 12:50 PM    Prescriptions: SIMVASTATIN 80 MG TABS (SIMVASTATIN) qhs  #30 x 5   Entered by:   Josph Macho RMA   Authorized by:   Minus Breeding MD   Signed by:   Josph Macho RMA on 07/29/2009   Method used:   Electronically to        [facility]* (retail)       [address]       [place], [place]  [address]       Ph: [phone] or [phone]       Fax: [phone]   RxID:   [id number]

## 2010-05-13 NOTE — Progress Notes (Signed)
Summary: rx refill req  Phone Note Refill Request Message from:  Fax from Pharmacy on February 13, 2010 10:08 AM  Refills Requested: Medication #1:  HUMALOG 100 UNIT/ML SOLN three times a day (just before each meal) 60-70-110 units   Dosage confirmed as above?Dosage Confirmed   Last Refilled: 01/16/2010  Medication #2:  SYNTHROID 100 MCG TABS take 1 by mouth qd   Dosage confirmed as above?Dosage Confirmed   Last Refilled: 12/13/2009  Medication #3:  SIMVASTATIN 80 MG TABS qhs   Dosage confirmed as above?Dosage Confirmed   Last Refilled: 01/16/2010  Method Requested: Electronic Next Appointment Scheduled: none Initial call taken by: Brenton Grills CMA (AAMA),  February 13, 2010 10:09 AM    Prescriptions: SIMVASTATIN 80 MG TABS (SIMVASTATIN) qhs  #30 x 2   Entered by:   Brenton Grills CMA (AAMA)   Authorized by:   Minus Breeding MD   Signed by:   Brenton Grills CMA (AAMA) on 02/13/2010   Method used:   Electronically to        [facility] (retail)       [address]       [place], [place]  [address]       Ph: [phone] or [phone]       Fax: [phone]   RxID:   [id number] SYNTHROID 100 MCG TABS (LEVOTHYROXINE SODIUM) take 1 by mouth qd  #90 x 1   Entered by:   Brenton Grills CMA (AAMA)   Authorized by:   Minus Breeding MD   Signed by:   Brenton Grills CMA (AAMA) on 02/13/2010   Method used:   Electronically to        [facility] (retail)       [address]       [place], [place]  [address]       Ph: [phone] or [phone]       Fax: [phone]   RxID:   [id number] HUMALOG 100 UNIT/ML SOLN (INSULIN LISPRO (HUMAN)) three times a day (just before each meal) 60-70-110 units  #1 month x 2   Entered by:   Brenton Grills CMA (AAMA)   Authorized by:   Minus Breeding MD   Signed by:   Brenton Grills CMA (AAMA) on 02/13/2010   Method used:   Electronically to        [facility]  (retail)       [address]       [place], [place]  [address]       Ph: [phone] or [phone]       Fax: [phone]   RxID:   [id number]

## 2010-05-13 NOTE — Progress Notes (Signed)
Summary: humalog   Phone Note Refill Request Message from:  Fax from Pharmacy  Refills Requested: Medication #1:  HUMALOG 100 UNIT/ML SOLN three times a day (just before each meal) 60-70-90 units    Prescriptions: HUMALOG 100 UNIT/ML SOLN (INSULIN LISPRO (HUMAN)) three times a day (just before each meal) 60-70-90 units  #1 month x 3   Entered by:   Brenton Grills MA   Authorized by:   Minus Breeding MD   Signed by:   Brenton Grills MA on 10/08/2009   Method used:   Faxed to ...       [facility]* (retail)       [address]       [place], [place]  [address]       Ph: [phone] or [phone]       Fax: [phone]   RxID:   [id number]

## 2010-05-13 NOTE — Progress Notes (Signed)
  Phone Note Refill Request Message from:  Fax from Pharmacy on June 25, 2009 10:43 AM  Refills Requested: Medication #1:  SYNTHROID 100 MCG TABS take 1 by mouth qd   Dosage confirmed as above?Dosage Confirmed Initial call taken by: Josph Macho RMA,  June 25, 2009 10:44 AM    Prescriptions: SYNTHROID 100 MCG TABS (LEVOTHYROXINE SODIUM) take 1 by mouth qd  #90 x 2   Entered by:   Josph Macho RMA   Authorized by:   Minus Breeding MD   Signed by:   Josph Macho RMA on 06/25/2009   Method used:   Electronically to        [facility] (retail)       [address]       [place], [place]  [address]       Ph: [phone] or [phone]       Fax: [phone]   RxID:   [id number]

## 2010-06-30 ENCOUNTER — Telehealth: Payer: Self-pay | Admitting: Endocrinology

## 2010-07-04 ENCOUNTER — Encounter: Payer: Self-pay | Admitting: *Deleted

## 2010-07-09 ENCOUNTER — Other Ambulatory Visit (HOSPITAL_COMMUNITY): Payer: Self-pay | Admitting: Family Medicine

## 2010-07-09 DIAGNOSIS — E049 Nontoxic goiter, unspecified: Secondary | ICD-10-CM

## 2010-07-10 NOTE — Progress Notes (Signed)
Summary: rx refill req  Phone Note Refill Request Message from:  Fax from Pharmacy on June 30, 2010 1:22 PM  Refills Requested: Medication #1:  ENALAPRIL MALEATE 10 MG TABS TAKE 1 two times a day   Dosage confirmed as above?Dosage Confirmed   Last Refilled: 05/05/2010  Method Requested: Electronic Next Appointment Scheduled: none Initial call taken by: Brenton Grills CMA (AAMA),  June 30, 2010 1:22 PM    Prescriptions: SIMVASTATIN 80 MG TABS (SIMVASTATIN) qhs  #30 x 3   Entered by:   Brenton Grills CMA (AAMA)   Authorized by:   Minus Breeding MD   Signed by:   Brenton Grills CMA (AAMA) on 06/30/2010   Method used:   Electronically to        [facility] (retail)       [address]       [place], [place]  [address]       Ph: [phone] or [phone]       Fax: [phone]   RxID:   [id number]

## 2010-07-11 ENCOUNTER — Ambulatory Visit (HOSPITAL_COMMUNITY)
Admission: RE | Admit: 2010-07-11 | Discharge: 2010-07-11 | Disposition: A | Discharge status: Home / Self Care | Payer: Medicare Other | Source: Ambulatory Visit | Attending: Family Medicine | Admitting: Family Medicine

## 2010-07-11 DIAGNOSIS — E049 Nontoxic goiter, unspecified: Secondary | ICD-10-CM | POA: Insufficient documentation

## 2010-07-20 LAB — CBC
HCT: 37 % (ref 36.0–46.0)
Hemoglobin: 12.9 g/dL (ref 12.0–15.0)
MCHC: 35.1 g/dL (ref 30.0–36.0)
RBC: 4.08 MIL/uL (ref 3.87–5.11)

## 2010-07-20 LAB — POCT CARDIAC MARKERS
CKMB, poc: <1 ng/mL — ABNORMAL LOW (ref 1.0–8.0)
Myoglobin, poc: 128 ng/mL (ref 12–200)
Troponin i, poc: <0.05 ng/mL (ref 0.00–0.09)

## 2010-07-20 LAB — COMPREHENSIVE METABOLIC PANEL
ALT: 21 U/L (ref 0–35)
AST: 22 U/L (ref 0–37)
Alkaline Phosphatase: 47 U/L (ref 39–117)
Chloride: 99 mEq/L (ref 96–112)
GFR calc Af Amer: >60 mL/min (ref >60)
Total Bilirubin: 0.6 mg/dL (ref 0.3–1.2)

## 2010-07-20 LAB — DIFFERENTIAL
Lymphocytes Relative: 24 % (ref 12–46)
Lymphs Abs: 2.3 10*3/uL (ref 0.7–4.0)
Monocytes Relative: 9 % (ref 3–12)
Neutro Abs: 6 10*3/uL (ref 1.7–7.7)
Neutrophils Relative %: 63 % (ref 43–77)

## 2010-07-28 ENCOUNTER — Encounter: Payer: Self-pay | Admitting: *Deleted

## 2010-08-04 ENCOUNTER — Other Ambulatory Visit: Payer: Self-pay | Admitting: Endocrinology

## 2010-08-04 MED ORDER — INSULIN LISPRO 100 UNIT/ML ~~LOC~~ SOLN: SUBCUTANEOUS | Status: DC

## 2010-08-04 NOTE — Telephone Encounter (Signed)
R'cd fax from [facility] for refill of  pt's Humalog  Last OV-12/09/2009  Last Filled-05/05/2010

## 2010-08-06 ENCOUNTER — Other Ambulatory Visit: Payer: Self-pay | Admitting: Endocrinology

## 2010-08-06 NOTE — Telephone Encounter (Signed)
[facility] sent fax regarding pt's Humalog rx. Insurance will no longer pay for Humalog and the alternative is [employer] (ie Novolog- Please advise

## 2010-08-06 NOTE — Telephone Encounter (Signed)
Ov is due.  Let's address then 

## 2010-08-07 NOTE — Telephone Encounter (Signed)
Unable to reach pt at number on file (disconnected) to schedule OV.

## 2010-08-08 ENCOUNTER — Encounter: Payer: Self-pay | Admitting: Endocrinology

## 2010-08-08 NOTE — Telephone Encounter (Signed)
Unable to reach pt at number on file (disconnected) will generate letter to sent to pt to schedule OV.

## 2010-08-18 ENCOUNTER — Ambulatory Visit (INDEPENDENT_AMBULATORY_CARE_PROVIDER_SITE_OTHER): Payer: Medicare Other | Admitting: *Deleted

## 2010-08-18 DIAGNOSIS — Z95810 Presence of automatic (implantable) cardiac defibrillator: Secondary | ICD-10-CM

## 2010-08-18 DIAGNOSIS — I5022 Chronic systolic (congestive) heart failure: Secondary | ICD-10-CM

## 2010-08-18 DIAGNOSIS — I2589 Other forms of chronic ischemic heart disease: Secondary | ICD-10-CM

## 2010-08-18 DIAGNOSIS — I509 Heart failure, unspecified: Secondary | ICD-10-CM

## 2010-08-26 NOTE — Assessment & Plan Note (Signed)
[facility]                         ELECTROPHYSIOLOGY OFFICE NOTE   NAME:[name] [name], [name] [name]                     MRN:          [id number]  DATE:10/04/2007                            DOB:          12/26/1945    [name] [name] returns today for followup.  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  She returns today for followup.  She denies chest  pain.  She denies shortness of breath.  She had no specific complaints  today.  Her medications include:  1. Potassium 20 twice [name] day.  2. Synthroid 100 daily.  3. Aspirin 81 [name] day.  4. Coreg 25 twice [name] day.  5. Vytorin 10/40.  6. Actos 50 [name] day.  7. Metformin 500 twice daily.  8. Lasix 40 three times daily.  9. Enalapril five twice daily.  10.Xanax 5 nightly.   PHYSICAL EXAMINATION:  GENERAL:  She is [name] pleasant, obese middle-aged  woman in no distress.  VITAL SIGNS:  Blood pressure 126/55, the pulse 74 and regular, the  respirations were 18, the weight was 240 pounds.  NECK:  Revealed no jugular distention.  LUNGS:  Clear bilaterally to auscultation.  No wheezes, rales, or  rhonchi are present.  CARDIOVASCULAR:  Regular rate and rhythm.  Normal S1 and S2.  EXTREMITIES:  No edema.   Interrogation of the defibrillator demonstrates [name] [name] [name] [employer].  P-waves were 2.9.  R-waves were greater than 25.  The LV  wave was 20, the impedance 559 in the atrium; 700 in the ventricle; and  800 in the left ventricle; threshold 0.8 at 0.4 in both the right  ventricle and the left ventricle.  The atrial threshold was 0.2-0.4.  Battery voltage was 3.04 V.  Underlying rhythm was sinus.  She was 6% [name]  paced and 98% V paced.  Today, we increased her PVARP to allow for  blanking of PVCs getting through into the atrium.   IMPRESSION:  1. Ischemic  cardiomyopathy.  2. Congestive heart failure.  3. Syncope.  4. Status post biventricular implantable cardioverter-defibrillator      insertion secondary to all of the above with severe left      ventricular dysfunction, now improved.   DISCUSSION:  [name] [name] is stable.  Her defibrillator is working  normally.  We will plan to see her back in the office in February 2010.     [name] [name]. [name] Ridgel, MD  Electronically Signed    GWT/MedQ  DD: 10/04/2007  DT: 10/05/2007  Job #: [id number]

## 2010-08-26 NOTE — Assessment & Plan Note (Signed)
[facility]                            CARDIOLOGY OFFICE NOTE   NAME:[name] [name], [name] [name]                     MRN:          [id number]  DATE:05/03/2008                            DOB:          27-Jun-1945    PRIMARY CARE PHYSICIAN:  Donna Bernard, MD   CLINICAL HISTORY:  [name] [name] is 65 years old and returned for followup  management of her coronary heart disease and congestive heart failure.  She had bypass surgery in 2001 at [facility] and developed heart  failure and was found to have an ejection fraction of 25%.  Her grafts  were patent at catheterization.  Subsequently she underwent implantation  of a dual-chamber biventricular pacemaker with ICD.  She has done quite  well since that time and her ejection fraction improved from 25% to 45-  50%.  She said she has had no recent chest pain, shortness of breath, or  palpitations.   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   PAST MEDICAL HISTORY:  Significant for diabetes and hyperlipidemia.   CURRENT MEDICATIONS:  1. Lasix 40 mg t.i.d.  2. KCl 20 mEq b.i.d.  3. Levothyroxine.  4. Aspirin.  5. Multivitamins.  6. Coreg 25 b.i.d.  7. Vytorin 10/40 daily.  8. Actos.  9. Metformin.  10.Lantus.  11.Humulin insulin.  12.Enalapril 5 b.i.d.  13.Xanax.  14.Fish oil.  15.Vitamin E.   PHYSICAL EXAMINATION:  VITAL SIGNS:  Blood pressure is 135/68 and pulse  is 76 and regular.  NECK:  There is no venous distention.  The carotid pulses were full.  CHEST:  Clear.  CARDIAC:  Rhythm is regular.  No murmurs or gallops.  ABDOMEN:  Soft and normal bowel sounds.  EXTREMITIES:  There is trace peripheral edema.  Peripheral pulses are  equal.   Electrocardiogram showed atrial tracking and ventricular pacing.   Interrogated pacemaker showed good threshold.  She was pacing the  ventricle all the time.   IMPRESSION:  1. Coronary artery disease, status post coronary artery bypass graft      surgery in 2001 with patent grafts in 2007.  2. Ischemic cardiomyopathy, ejection fraction of 25% improved to 45-      50% following biventricular pacing.  3. Class I-II congestive heart failure, currently euvolemic.  4. Status post biventricular dual-chamber pacemaker and implantable      cardioverter-defibrillator implantation.  5. Insulin-dependent diabetes.  6. Hyperlipidemia.   RECOMMENDATIONS:  I think Ms. Demers is doing well.  She is not on  optimum doses of her ACE inhibitor, and her blood pressure will permit  higher doses, so we will increase her enalapril from 5 b.i.d. to 10  b.i.d.  As she has no evidence of volume overload, and I think we can  cut her Lasix back from 40 t.i.d. to 40 b.i.d.  We will have her come in  for fasting lab work including a BMP, BNP, CBC, lipid, liver, and  hemoglobin A1c.  I will plan to see her back in followup in 6  months.  She plan to see Dr. Everardo All for the help with management of her  diabetes.     Bruce Elvera Lennox Juanda Chance, MD, [facility]  Electronically Signed    BRB/MedQ  DD: 05/03/2008  DT: 05/04/2008  Job #: [id number]   cc:   Gregary Signs A. Everardo All, MD

## 2010-08-26 NOTE — Assessment & Plan Note (Signed)
[facility]                            CARDIOLOGY OFFICE NOTE   NAME:Cashwell, SURAIYA DICKERSON                     MRN:          [id number]  DATE:10/14/2006                            DOB:          08-Jul-1945    PRIMARY CARE PHYSICIAN:  Donna Bernard, M.D.   CLINICAL HISTORY:  Ms. Hedberg is 65 years old and had bypass surgery in  2001 at [facility] and was admitted last September at [facility] with acute pulmonary edema and found to have an ejection  fraction of 25%.  She underwent catheterization in the outpatient  laboratory and was found to have patent grafts and a wedge of 22.   Since that time, she has done much better.  She has had minimal symptoms  of shortness of breath.  No chest pain and no palpitations.  We had  considered her for an ICD, and she saw Dr. Graciela Husbands, but she has decided  against having this done for the present.   She has gone back to work now, and she works at the [employer] for [employer], helping homeless people find homes.   Her past medical history is significant for diabetes and hyperlipidemia.   CURRENT MEDICATIONS:  1. Potassium.  2. Furosemide 40 mg t.i.d.  3. Coreg 25 mg b.i.d.  4. Fish oil.  5. Vytorin.  6. Enalapril 10 mg b.i.d.  7. Insulin, Humalog and Lantus.  8. Actos/Metformin.   PHYSICAL EXAMINATION:  VITAL SIGNS:  Blood pressure 90/59, pulse 65 and  regular.  There is no venous distention.  NECK:  Carotid pulses were full without bruits.  CHEST:  Clear.  CARDIAC:  Rhythm was regular.  There are no murmurs or gallops.  ABDOMEN:  Soft with normal bowel sounds.  There is no  hepatosplenomegaly.  EXTREMITIES:  There is no peripheral edema.  Pedal pulses are equal.   Electrocardiogram showed left bundle branch block.   IMPRESSION:  1. Coronary artery disease, status post coronary artery bypass graft      surgery at [facility] in 2001.  2. Ischemic cardiomyopathy  with an ejection fraction of 25%.  3. Patent grafts at catheterization in December, 2007.  4. Congestive heart failure due to systolic dysfunction, class II.  5. Insulin-dependent diabetes.  6. Hyperlipidemia.   RECOMMENDATIONS:  I think Ms. Craighead is doing well.  She is decided  against the defibrillator, at least for the present.  I plan to get a  BNP and a CBC and a hemoglobin A1C.  I will see her in six months, and  she will see Dr. Gerda Diss in the interim.     Bruce Elvera Lennox Juanda Chance, MD, [facility]  Electronically Signed    BRB/MedQ  DD: 10/14/2006  DT: 10/14/2006  Job #: [id number]

## 2010-08-26 NOTE — Assessment & Plan Note (Signed)
[facility]                            CARDIOLOGY OFFICE NOTE   NAME:[name], [name]                     MRN:          [id number]  DATE:06/21/2007                            DOB:          February 22, 1946    PRIMARY CARE PHYSICIAN:  [name] [name], M.D.   CLINICAL HISTORY:  [name] [name] is  65 years old, and returns for a  followup visit after a recent hospitalization and bi-V pacer ICD  implantation.  She had bypass surgery in 2001 at [facility] and  subsequently developed acute pulmonary edema and underwent  catheterization last year and was found to have patent grafts and an  ejection fraction of 25%.  She declined an ICD at that time, but did  well on medical therapy.   She then developed symptoms of presyncope and was found to have a Mobitz  type 2 second-degree AV block with significant bradycardia.  She was  hospitalized here and underwent implantation of a bi-V DDD pacemaker  with ICD.  She has done quite well since discharge.  She was seen in  pacer clinic and she was back at work within a couple of days of going  home.  Despite her severe cardiac disease, she works at [employer] for [employer], helping homeless people find homes.   PAST MEDICAL HISTORY:  Her past medical history is significant for  diabetes and hyperlipidemia.   CURRENT MEDICATIONS INCLUDE:  1. K-Dur 20 mEq b.i.d.  2. Levothyroxine.  3. Aspirin.  4. Coreg 25 mg b.i.d.  5. Vytorin 10/40 daily.  6. Actos.  7. Metformin.  8. Lantus insulin.  9. Humalog insulin.  10.Lasix 40 mg t.i.d.  11.Enalapril 5 mg b.i.d.  12.Xanax.  13.Fish oil.   EXAMINATION:  The blood pressure was 110/60 and the pulse 71 and  regular.  There was no venous distention.  The carotid pulses were full without  bruits.  Chest was clear.  The cardiac rhythm was regular, no murmurs or gallops.  The abdomen was soft without organomegaly.  Peripheral pulses were full and  there was no peripheral edema.   An electrocardiogram today showed atrial tracking and ventricular  pacing.   IMPRESSION:  1. Coronary artery disease, status post coronary bypass graft surgery      in 2001 with patent grafts in 2007.  2. Ischemic cardiomyopathy, ejection fraction of 25%.  3. Class 1-2 congestive heart failure, related to systolic      dysfunction.  4. Status post bi-V pacer ICD for type 2 second-degree AV block and      systolic congestive heart failure.  5. Insulin-dependent diabetes.  6. Hyperlipidemia.   RECOMMENDATIONS:  I think [name] [name] is doing extremely well,  especially considering the severity of her disease.  We will get a CBC  and BMP on her today.  She has a stitch at the site of her pocket that  we will clip.  I will see her back in four months.     Bruce Elvera Lennox Juanda Chance, MD, [facility]  Electronically Signed    BRB/MedQ  DD: 06/21/2007  DT: 06/22/2007  Job #: [id number]   cc:   [name] [name], M.D.

## 2010-08-26 NOTE — Assessment & Plan Note (Signed)
[facility]                            CARDIOLOGY OFFICE NOTE   NAME:[name] [name], [name] [name]                     MRN:          [id number]  DATE:10/25/2007                            DOB:          [birth year]    PRIMARY CARE PHYSICIAN:  [name] Bernard, MD   CLINICAL HISTORY:  Ms. Heal is a 65 year old who returned for  followup management of her ischemic cardiomyopathy and congestive heart  failure.  She had bypass surgery in 2001 at [facility] and  subsequently developed heart failure and was found to have an ejection  fraction of 25%.  We did a cardiac catheterization.  All of her grafts  were patent.  She initially declined an ICD, but subsequently developed  AV block and underwent implantation of a dual-chamber and biventricular  pacemaker with ICD.  She has done quite well since that time.  She has  had no recent chest pain, shortness breath, or palpitations.  Her last  echocardiogram, which was done in February 2009 showed an improvement in  ejection fraction to 45-50%.   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  She has  still been doing this on a regular basis.   PAST MEDICAL HISTORY:  Significant for diabetes and hyperlipidemia.   CURRENT MEDICATIONS:  Include K-Dur 20 mEq b.i.d., levothyroxine,  aspirin, multivitamins, Coreg 25 mg b.i.d., Vytorin 10/40 daily, Actos,  metformin, Lantus, Humalog, Lasix 40 mg t.i.d., enalapril 5 mg b.i.d.,  and fish oil.   PHYSICAL EXAMINATION:  The blood pressure is 124/68 and pulse 70 and  regular.  There was no venous distension.  The carotid pulses were full  without bruits.  Chest was clear without rales or rhonchi.  Cardiac  rhythm was regular.  No murmurs or gallops.  The abdomen was soft with  normal bowel sounds.  There was no peripheral edema.  Pedal pulses are  equal.   Electrocardiogram showed  atrial tracking and ventricular pacing.   IMPRESSION:  1. Coronary artery disease, status post coronary artery bypass graft      surgery in 2001 with patent grafts in 2007.  2. Ischemic cardiomyopathy, ejection fraction of 25%, improved to 45-      50% following biventricular pacing.  3. Class I- to II systolic congestive heart failure, not euvolemic.  4. Status post biventricular dual-chamber and biventricular      pacer/implantable cardioverter-defibrillator for high-degree      arteriovenous block.  5. Insulin-dependent diabetes.  6. Hyperlipidemia.   RECOMMENDATIONS:  [name] is doing extremely well.  Her LV function  is recovered extremely well followed biventricular pacer.  We will plan  to continue her with same medications.  We will get a CBC, BMP, and BNP on her today.  I will plan to see her  back in followup in 6 months.     Bruce Elvera Lennox Juanda Chance, MD, [facility]  Electronically Signed    BRB/MedQ  DD: 10/25/2007  DT: 10/26/2007  Job #: [id number]

## 2010-08-26 NOTE — Op Note (Signed)
[name] [name], [name] [name] NO.:  [id number]   MEDICAL RECORD NO.:  [id number]          PATIENT TYPE:  INP   LOCATION:  2314                         FACILITY:  [facility]   PHYSICIAN:  Doylene Canning. Ladona Ridgel, MD    DATE OF BIRTH:  1945/07/23   DATE OF PROCEDURE:  05/16/2007  DATE OF DISCHARGE:                               OPERATIVE REPORT   PROCEDURE PERFORMED:  Implantation of a biventricular ICD with  defibrillation threshold testing.   INTRODUCTION:  The patient is a 65 year old woman with a longstanding  ischemic cardiomyopathy, EF of 25%, who presented to the hospital with  near syncope and 2:1 heart block.  She has class II heart failure,  sometimes class III, and is now referred for a BiV ICD insertion  secondary to her ischemic cardiomyopathy, congestive heart failure, left  bundle branch block and 2:1 heart block requiring continuous pacing.   PROCEDURE:  After informed consent was obtained, the patient was taken  to the diagnostic EP lab in the fasting state.  After the usual  preparation and draping, intravenous fentanyl and midazolam was given  for sedation.  Lidocaine 30 mL was infiltrated in the left  infraclavicular clavicular region.  A 9 cm incision was carried out over  this region.  Electrocautery was utilized to dissect down to the fascial  plane.  The left subclavian vein was punctured x3 and the Guidant  Dextrus model 4135 active fixation pacing lead, serial number [id number],  was advanced to the right atrium.  The Guidant model 0157 9 [place]  active fixation defibrillation lead, serial number [id number], was advanced  to the right ventricle and mapping was then carried out.  At the final  site on the RV septum, the R-waves measured 9 and the impedance 546  ohms, threshold 0.6 volts at 0.5 milliseconds.  A 10-volt pacing did not  stimulate the diaphragm.  With the RV lead in satisfactory position,  attention was then turned to placement of the atrial lead which  was  placed in the anterolateral portion of the right atrium where the P-  waves measured 2.5  mV, impedance 537 ohms and threshold 0.8 volts at  0.5 milliseconds once the lead was actively fixed.  With the right  atrial and right ventricular leads in satisfactory position, attention  was then turned to placement of the LV lead which was placed by way of a  guiding catheter which had been advanced into the right atrium.  A 6-  [place] hexapolar EP catheter was utilized to cannulate the coronary  sinus.  Having done so, venography was carried out in the coronary sinus  demonstrating a a small posterior vein which was quite tortuous and a  smaller high lateral vein which was initially selected for LV placement.  The Guidant Acuity Steerable LV lead, serial number [id number], was advanced  into the lateral vein and at a location approximately one third from  base to apex, the lead was deployed.  In this location, the threshold  was 1.2 volts at 0.5 milliseconds and the pacing impedance was 1190  ohms.  A 10-volt  pacing did not stimulate the diaphragm.  With these  satisfactory parameters, the leads were secured to the subpectoralis  fascia with a figure-of-eight silk suture and the sewing sleeve was also  secured with silk suture.  Electrocautery was utilized to make  subcutaneous pocket.  Kanamycin irrigation was utilized to irrigate the  pocket.  Electrocautery was utilized to assure hemostasis.  The [employer] H220 BiV ICD, serial number [id number], was connected to  the RA, RV and LV leads and placed back in the subcutaneous pocket.  Generator was secured with silk suture.  Additional kanamycin was  utilized to irrigate the pocket and defibrillation threshold testing was  carried out.   After the patient was more deeply sedated with fentanyl and Versed, VF  was induced with a T-wave shock and a 14-joule shock was delivered which  terminated VF and restored sinus rhythm.  At this  point, no additional  defibrillation threshold testing was carried out and the incision was  closed with 2-0 Vicryl, followed by layer of 3-0 Vicryl, followed by a  layer of 4-0 Vicryl.  Benzoin was painted on the skin.  Steri-Strips  were applied and a pressure dressing was placed.  The patient was  returned to her room in satisfactory condition.   COMPLICATIONS:  There were no immediate procedure complications.   RESULTS:  This demonstrates successful implantation of a Guidant [employer] biventricular implantable cardioverter defibrillator in a  patient with an ischemic cardiomyopathy, congestive heart failure, left  bundle branch block with ejection fraction 25% and 2:1 heart block.      Doylene Canning. Ladona Ridgel, MD  Electronically Signed     GWT/MEDQ  D:  05/16/2007  T:  05/17/2007  Job:  [id number]   cc:   Everardo Beals. Juanda Chance, MD, Nena Polio, M.D.

## 2010-08-26 NOTE — Discharge Summary (Signed)
[name] [name], [name] [name]              ACCOUNT NO.:  [id number]   MEDICAL RECORD NO.:  [id number]          PATIENT TYPE:  INP   LOCATION:  2020                         FACILITY:  [facility]   PHYSICIAN:  Maisie Fus C. Wall, MD, FACCDATE OF BIRTH:  September 29, 1945   DATE OF ADMISSION:  05/16/2007  DATE OF DISCHARGE:  05/18/2007                               DISCHARGE SUMMARY   PRIMARY CARE PHYSICIAN:  Dr. Gerda Diss.   ALLERGIES:  NO KNOWN DRUG ALLERGIES.   DISCHARGE PROCESS:  Time for this dictation and exam greater than 35  minutes.   FINAL DIAGNOSES:  1. Discharging day #2; status post implant of a [employer] CRT-D system.  2. QRS after implant of cardiac resynchronization.  The system has      gone from 176 to 98 milliseconds.      a.     Admitted with presyncope.      b.     Mobitz II, type 2 heart block.  3. A 2-D echocardiogram May 16, 2007;  ejection fraction 45-50%,      with hypokinesis of the inferior wall and dyssynergy of the IV      septum.  4. Mild decompensation of a New York Heart Association Class II      chronic, systolic congestive heart failure -- responsive to IV      diuretics.   SECONDARY DIAGNOSES:  1. Three-vessel coronary artery disease, status post coronary artery      bypass graft surgery in 2001.      a.     Ischemic cardiomyopathy, with ejection fraction 25% at       echocardiogram March 2008.      b.     Left bundle branch block.  2. New York Heart Association Class II chronic, systolic congestive      heart failure. Symptoms exacerbated by high-grade heart block.  3. Diabetes.  4. Hypertension.  5. Treated hypothyroidism.   PROCEDURES:  1. 2-D echocardiogram, May 15, 2006:  Ejection fraction 45-50%.      Hypokinesis of inferior wall and dyssynergy of the intraventricular      septum.  2. May 16, 2007:  Implant of the [employer] dual-      chamber cardioverter defibrillator, with left ventricular cardiac  resynchronization lead (Dr. Lewayne Bunting).  The patient has had no      postprocedural complications.  No hematoma.  The incision is sore,      but it is treated with oral analgesics.  The patient is discharging      postprocedure day #2, after further addressing her mild congestive      heart failure.   BRIEF HISTORY:  Ms. Lapage is a 65 year old female.  She has coronary  artery disease.  She is status post coronary artery bypass graft  surgery.  She has an ischemic cardiomyopathy.  She has diabetes and a  left bundle branch block.   The patient was at the beach on Saturday, May 14, 2007.  She felt  very dizzy every time she tried to stand up to  do anything.  She had no  frank syncope, but profound presyncope.  On February 2,  she drove home  and again got very dizzy in getting out of the car.  She denies chest  pain or shortness of breath.   She presented to the [facility] Emergency room because of her persistent  dizziness.  An electrocardiogram showed a Mobitz I block.  She was  transferred to [facility], where electrocardiogram now shows a  high-grade heart block, Mobitz II type 2 block.  She is, at time of the  exam, feeling fine and lying in bed.   HOSPITAL COURSE:  The patient was admitted to [facility] in  transfer from [facility] with dizziness and a high-grade heart block.  She underwent implantation of a temporary pacemaker at the right femoral  access, and was seen on May 16, 2007 by Dr. Lewayne Bunting; who  recommended implantation of a Bi-V ICD .  This was done the same day  without complications.  As mentioned above, it was a [employer] manager dual-chamber cardioverter-defibrillator.  The patient has  responded well to IV diuresis.  Her troponin I studies this admission  were 0.02, then 0.03, then 0.16.  TSH was 1.871.  Patient is discharging  on day #2 after implantation of the device.   FOLLOWUP:  She has follow-up appointments  at  1. [facility], [address].  2. [facility]; Wednesday, May 25, 2007 at 9:20.  A basic      metabolic panel will be drawn at that time to check creatinine and      potassium.  3. She sees Dr. Juanda Chance on Tuesday, June 21, 2007 at 4:15.  4. She sees Dr. Ladona Ridgel on Tuesday, Aug 30, 2007 at 2:50.   She is asked to keep her incision dry for the next 6 days, and to sponge  bathe until Monday,  February 9.   MEDICATIONS AT DISCHARGE:  1. Lasix 80 mg in the morning and 40 mg in the evening.  This is a new      arrangement of her dose; it was 40 mg t.i.d. prior to this      admission.  2. Enalapril 10 mg daily.  3. Potassium chloride 20 mEq twice daily.  4. Levothyroxine 100 mcg daily.  5. Enteric-coated aspirin 81 mg daily.  6. Multivitamin daily.  7. Coreg 25 mg twice daily.  8. Vytorin 10/40 daily at bedtime.  9. Actos 15 mg daily.  10.Metformin 500 mg twice daily.  11.Lantus 35 units injected twice daily.  12.Sliding scale as needed.   LABS:  Complete Blood Count (May 18, 2007) -- Hemoglobin 10.1,  hematocrit 29.4, white cells 7.9, platelets 194.  Serum Electrolytes:  (May 18, 2007) -- Sodium 136, potassium 4.3, chloride 102,  bicarbonate 30, BUN 12, creatinine 0.71, glucose 103.      Maple Mirza, PA      Maisie Fus C. Daleen Squibb, MD, [facility]  Electronically Signed    GM/MEDQ  D:  05/18/2007  T:  05/18/2007  Job:  [id number]   cc:   Everardo Beals. Juanda Chance, MD, [facility]  Doylene Canning. Ladona Ridgel, MD  Dr. Gerda Diss

## 2010-08-26 NOTE — H&P (Signed)
NAMEJEWELIA, BOCCHINO NO.:  [id number]   MEDICAL RECORD NO.:  [id number]          PATIENT TYPE:  INP   LOCATION:  2314                         FACILITY:  [facility]   PHYSICIAN:  Christell Faith, MD   DATE OF BIRTH:  Mar 13, 1946   DATE OF ADMISSION:  05/16/2007  DATE OF DISCHARGE:                              HISTORY & PHYSICAL   ADMITTING PHYSICIAN:  Admit to Dr. Charlies Constable with [facility].   PRIMARY CARE PHYSICIAN:  Donna Bernard, M.D.   CHIEF COMPLAINT:  Presyncope.   HISTORY OF PRESENT ILLNESS:  This is a 65 year old white female with  coronary artery disease and history of CABG with ischemic cardiomyopathy  and diabetes who was at the beach yesterday (Saturday) and felt very  dizzy every time she tried to stand up to do anything.  No frank syncope  but profound presyncope.  Today she drove home and again got very dizzy  upon getting out of the car.  She denies chest pain or shortness of  breath.  She went to [facility] emergency room because of  persistent dizziness and the EKG showed sinus rhythm with Mobitz 1  block.  She was transferred to [facility] where EKG now shows sinus  rhythm with Mobitz type 2 block.  As long as she is lying still in bed  she feels fine.   PAST MEDICAL HISTORY:  1. Coronary artery disease with four vessel CABG at [facility]      in 2001.  She was last cathed in December of 2007.  Vein graft to      PDA patent, vein graft to OM patent, vein graft to ramus patent,      LIMA to LAD patent.  The patient's native LAD, RCA, and left      circumflex were occluded and there was an 80% stenosis of the left      mainstem.  2. Ischemic cardiomyopathy with ejection fraction 25% on      echocardiogram from March, 2008.  3. Chronic left bundle branch block.  4. Diabetes mellitus type 2.  5. Hyperlipidemia.  6. Peripheral vascular disease with decreased ankle brachial indexes      from December, 2007.  7.  Hypothyroidism.  8. Status post appendectomy.  9. Status post bilateral tubal ligation.   SOCIAL HISTORY:  Lives in [place] with her son.  She runs a homeless  shelter.  She is a nonsmoker and nondrinker.   FAMILY HISTORY:  Mother died at age 61 and had a CABG surgery earlier in  her life.  Father died at an old age and did have an MI near the time of  his death.  The patient lost a sister at age 62 to coronary disease.   ALLERGIES:  None.   MEDICATIONS:  1. Enalapril 10 mg p.o. daily.  2. Potassium chloride 20 mEq p.o. b.i.d.  3. Lasix 40 mg p.o. t.i.d.  4. Lantus 35 units subcutaneously b.i.d.  5. Aspirin 81 mg p.o. daily.  6. Synthroid 100 mcg p.o. daily.  7. Coreg 25 mg p.o. b.i.d.  8. Multivitamin one p.o. daily.  9. Humalog sliding scale insulin as directed.  10.Vytorin one p.o. daily.  11.Actos 15.  12.Metformin 500 mg p.o. b.i.d.   REVIEW OF SYSTEMS:  Positive for presyncope, otherwise the balance of 14  systems is reviewed and is negative including no stroke, no TIA, no  dyspnea on exertion, no chest pain, no bleeding problems.   PHYSICAL EXAMINATION:  VITAL SIGNS:  Temperature 98.1, pulse at the  outside hospital was initially 61 and it is currently 35.  Respiratory  rate 14.  Blood pressure 128/35.  Saturation 100% on 2L.  GENERAL APPEARANCE:  This is a very pleasant white female in no acute  distress.  Pupils equal, round, reactive.  Sclerae clear.  Dentition is  fair.  Mucous membranes moist.  Oropharynx is clear without exudate or  thrush.  NECK:  Supple without lymphadenopathy, carotid bruits, or JVD.  CARDIAC:  Exam reveals bradycardic rate, regular rhythm.  Soft flow  murmur over the aortic valve.  LUNGS:  Clear to auscultation bilaterally without wheezing or rales.  ABDOMEN:  Soft, nontender, nondistended with normal bowel sounds.  EXTREMITIES:  With 2+ pitting edema bilaterally.  No clubbing or  cyanosis.  No rash.  MUSCULOSKELETAL:  No acute joint  effusions or tenderness.  NEUROLOGICAL:  Awake, alert, oriented x3.  5/5 strength in all four  extremities.   DIAGNOSTIC TESTS:  Electrocardiogram from outside hospital showed a rate  of 60 with sinus rhythm, Mobitz type 1 heart block with a left bundle  branch block.  EKG from [facility] at 2:05 A.M. shows sinus  rhythm with Mobitz type 2 heart block and a left bundle branch block.  Ventricular rate 35 beats per minute; atrial rate 70 beats per minute.   Prior EKGs (years ago) showed first degree AVB and LBBB.   LABORATORY DATA:  White blood cell count 11, hemoglobin 10.8, platelet  count 274,000.  Sodium 138, potassium 4.7, BUN 23, creatinine 0.8,  glucose 76, calcium 9, INR 1.1.  CK 51, CM-MB 1, troponin 0.03.   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  She also has an ejection fraction of 25% and history  of coronary artery bypass grafting surgery.   PLAN:  Admit to the intensive care unit to Dr. Juanda Chance.  The patient  needs a permanent pacing device, and given her left bundle branch block  and ejection fraction this should be a biventricular ICD.  I am worried  about her 2:1 block and wide QRS, and will call Dr. Riley Kill for help  placing a temporary wire emergently.  Will keep her NPO, hold  her beta blocker and give her 0.5 mg of atropine right now.  Will cycle  cardiac enzymes and EKG's and check transthoracic echocardiogram.  Will  control her diabetes with insulin and oral medicines.  Check TSH and  continue Synthroid.  Continue aspirin and Statin.      Christell Faith, MD  Electronically Signed     NDL/MEDQ  D:  05/16/2007  T:  05/16/2007  Job:  [id number]

## 2010-08-26 NOTE — Cardiovascular Report (Signed)
[name] [name], [name] [name]              ACCOUNT NO.:  [id number]   MEDICAL RECORD NO.:  [id number]          PATIENT TYPE:  INP   LOCATION:  2314                         FACILITY:  [facility]   PHYSICIAN:  Arturo Morton. Riley Kill, MD, FACCDATE OF BIRTH:  04-Sep-1945   DATE OF PROCEDURE:  DATE OF DISCHARGE:                            CARDIAC CATHETERIZATION   INDICATIONS:  The patient is a 65 year old who has an apparent history  of ischemic cardiomyopathy and is followed by Dr. Juanda Chance.  She also has  a history of diabetes.  She has a history of left bundle branch block  and also underlying LV dysfunction.  She apparently presented to the  [facility] emergency room  in 2:1 heart block.  She was otherwise stable  and she was presyncopal.  She was brought down for admission and Dr.  Daleen Squibb felt that she needed a temporary transvenous pacer.  As a result,  when attempt was made by Dr. Lalla Brothers to insert a temporary transvenous  pacer, he had some difficulty with access.  I was asked to come in and  assist him with this.  The patient's basic heart rate on nearly 10 mic's  of dopamine was 2:1 AV block with underlying ventricular rate of  approximately 40.  The patient was on Coreg and it was somewhat  difficult to tell when she was in Mobitz 1 or Mobitz 2, but her  underlying rhythm is left bundle branch block with first degree AV  block.  As a result, it was felt that a temporary venous insertion   PROCEDURES:  Insertion of temporary transvenous pacer.   DESCRIPTION OF PROCEDURE:  The patient, when I arrived, was supine on  fluoro table with fluoro in place.  She was prepped and draped in  sterile fashion.  Through an anterior puncture, the femoral vein was  entered.  A 6-French sheath was then placed.  Following this, a 5-French  Daig temporary transvenous pacing wire under fluoroscopic control passed  through the RV apex.  It was tested and found to be appropriate with  thresholds of approximately one.  It  was then set on 40 as the patient  is on dopamine presently.  The pacer was tested again.  I reanalyzed  under fluoroscopy, and then secured into place in the femoral vein.  There were no major complications with the procedure.      Arturo Morton. Riley Kill, MD, [facility]  Electronically Signed     TDS/MEDQ  D:  05/16/2007  T:  05/16/2007  Job:  [id number]

## 2010-08-26 NOTE — Assessment & Plan Note (Signed)
OFFICE VISIT   [name], [name]  DOB:  [birth year]                                       01/23/2009  OZHYQ#:[id number]   CHIEF COMPLAINT:  Pain in legs.   HISTORY OF PRESENT ILLNESS:  The patient has a history of left greater  than right calf pain when walking approximately 1-2 blocks.  This  improves with rest.  She states that the pain has gotten slowly worse  over the years.  Her primary atherosclerotic risk factors include  coronary artery disease and diabetes which are managed medically.  She  has a remote history of tobacco abuse but quit 15 years ago.  She is  also on disability for her heart disease and diabetes.  She denies any  rest pain.  She has no history of ulcerations on the feet.   PAST SURGICAL HISTORY:  Coronary artery bypass grafting in 2003.   MEDICATIONS:  1. Synthroid 100 mg once a day  2. Furosemide 40 mg twice a day.  3. Simvastatin 80 mg q.h.s.  4. Potassium 20 mEq twice a day.  5. Carvedilol 25 mg twice a day.  6. Enalapril 10 mg twice a day.  7. Vitamin E 400 international units once a day.  8. Women's vitamin once a day.  9. Fish oil 1200 mg once a day.  10.Flax oil 1200 mg once a day.  11.Aspirin 81 mg once a day.  12.Lantus insulin 50 units at night.  13.Humalog 70 units 3 times a day.   PAST MEDICAL HISTORY:  Is as mentioned above.  She also has had a  history of congestive heart failure which is also well-controlled.   FAMILY HISTORY:  She has a sister who had vascular disease at age less  than 49.   SOCIAL HISTORY:  She is single, has 1 child.  Smoking history is as  listed above.  She does not consume alcohol regularly.   REVIEW OF SYSTEMS:  She has had recent weight gain.  GI:  She has a history of intermittent constipation.  All other review of systems were negative as per intake referral form.   ALLERGIES:  She has no known drug allergies.   PHYSICAL EXAM:  Vital signs:  Blood pressure is 167/77 in the  left arm,  heart rate 76 and regular, respirations 16.  HEENT:  Normal.  Neck:  Has  2+ carotid pulses without bruit and no cervical adenopathy.  Chest:  Clear to auscultation.  Cardiac:  Regular rate and rhythm without  murmur.  Abdomen:  Is extremely obese, soft, nontender, nondistended.  No masses.  Extremities:  She has 2+ radial, 1+ femoral pulses  bilaterally.  She has absent popliteal and pedal pulses bilaterally.  She has no significant edema in the lower extremities.  She has no  ulcerations on her feet.  She has no significant joint or muscle  deformities.   She had bilateral ABIs which were performed on September 16 at [facility].  These were reviewed today.  This showed an ABI on the  right side of 0.54 and on the left of 0.37.   In summary, the patient has a history consistent with claudication  symptoms.  Her ABIs are decreased bilaterally but she currently is not  experiencing rest pain and has no evidence of tissue loss and is not  currently at risk of limb loss.  She has significant comorbidity of  extreme obesity.  She is not going to be a very good candidate for any  sort of open revascularization procedure.  Secondary to this she may be  a reasonable candidate for percutaneous revascularization if this is  required in the future.  However, I believe the best management for her  initially would be continued risk factor modification also with addition  of Pletal to her medical recommendation 100 mg twice a day.  She will  also start a walking program of 30 minutes daily to assist with weight  loss and also try to improve her claudication symptoms.  She will follow  up with repeat ABIs in 6 months' time.   Janetta Hora. Fields, MD  Electronically Signed   CEF/MEDQ  D:  01/24/2009  T:  01/24/2009  Job:  [id number]   cc:   Donna Bernard, M.D.

## 2010-08-29 NOTE — H&P (Signed)
[name] [name], [name] [name]              ACCOUNT NO.:  [id number]   MEDICAL RECORD NO.:  [id number]          PATIENT TYPE:  INP   LOCATION:  A201                          FACILITY:  [facility]   PHYSICIAN:  Scott A. Gerda Diss, MD    DATE OF BIRTH:  07/12/1945   DATE OF ADMISSION:  03/17/2006  DATE OF DISCHARGE:  LH                              HISTORY & PHYSICAL   CHIEF COMPLAINT:  Shortness of breath.   HISTORY OF PRESENT ILLNESS:  This is a 65 year old white female who just  had a very stressful evening.  Her husband essentially left with a  girlfriend tonight, and it just really stressed her out.  She became  emotionally upset, and she was feeling a little bit short of breath.  That became a little bit worse.  She states that she felt a little bit  of chest tightness, but no chest pain, and she also states that she felt  anxious, and she would rate the discomfort as a 1/10.  She felt quite  short of breath at first.   PAST MEDICAL HISTORY:  1. CHF.  2. CABG.  3. Diabetes.  4. Heart disease.  5. PMH.  6. History of MRSA infection.  7. History of myocardial infarction,   SURGICAL HISTORY:  1. Appendectomy.  2. CABG about 5 years ago.  3. Tonsillectomy.  4. Tubal ligation.   SOCIAL HISTORY:  Does not smoke or drink.  Is married, but certainly  under marital stress currently.   FAMILY HISTORY:  Noncontributory.   MEDICATIONS:  She does not really know the dosage of her medicines:  1. Enalapril.  2. Potassium chloride.  3. Lasix.  4. Humalog sliding scale, though she has not used this in months.  5. Lantus about 50 units once daily.  6. Multivitamin daily.  7. Aspirin 325 mg daily.  8. Synthroid; she does not know the dose of that.   She is under the care of Dr. Lubertha South as her primary care physician.  She is also under the care of Dr. Patrecia Pace for diabetes and thyroid.  She also has seen a cardiologist down in [place], of whom she does  not know the name.   PHYSICAL  EXAMINATION:  GENERAL:  __________.  HEENT:  _________.  CHEST:  Bilateral crackles and rales, bilateral in the bases.  HEART:  Regular.  ABDOMEN:  Soft.  EXTREMITIES:  No edema.   LABORATORY DATA:  Sodium 133, potassium 4.3, bicarbonate 24, glucose  263, BUN 18, creatinine 1.0.  Liver enzymes normal.  Two sets of cardiac  markers negative.  White count normal.  Hemoglobin 12.9.  In addition to  that, she had an O2 saturation of 76% when she first came in; put on 2  liters and it came up to 91, then after some Lasix came up to 98.  It  should be noted that her BNP is slightly elevated at 1900.  Her D-dimer  was slightly elevated, but they went ahead and did a CT scan of her  chest to rule out pulmonary embolism which reportedly did not show any  pulmonary embolus.   ASSESSMENT AND PLAN:  Congestive heart failure.  The patient needs to go  ahead and be treated with Lasix.  Will get an echo and consult  cardiology.  I think we may want to do a Myoview on Friday before  releasing her, although that will be up to them.  Will go ahead and do  cardiac enzymes for 2 more sets, and have a nutritional consult, and  also cover with insulin as before.  She is to followup for ongoing  troubles.      Scott A. Gerda Diss, MD  Electronically Signed     SAL/MEDQ  D:  03/17/2006  T:  03/18/2006  Job:  [id number]

## 2010-08-29 NOTE — Procedures (Signed)
NAMEPANG, ROBERS              ACCOUNT NO.:  [id number]   MEDICAL RECORD NO.:  [id number]          PATIENT TYPE:  INP   LOCATION:  A201                          FACILITY:  [facility]   PHYSICIAN:  Gerrit Friends. Dietrich Pates, MD, FACCDATE OF BIRTH:  1945/08/05   DATE OF PROCEDURE:  03/18/2006  DATE OF DISCHARGE:  03/19/2006                                ECHOCARDIOGRAM   REFERRING PHYSICIAN:  Donna Bernard, M.D.   CLINICAL DATA:  A 65 year old woman with coronary disease and congestive  heart failure.   M-MODE:  Aorta 3.0, left atrium 4.7, septum 1.0, posterior wall 1.2, LV  diastole 6.1, LV systole 5.7.   1. Technically suboptimal but adequate echocardiographic study.  2. Mild left atrial enlargement; normal right atrium and right      ventricle.  3. Mild aortic valvular sclerosis; no normal proximal ascending aorta.  4. Normal mitral valve; mild to moderate annular calcification.  5. Normal pulmonic valve and proximal pulmonary artery.  6. Normal tricuspid valve.  7. Moderate left ventricular dilatation; the septum is thin with      paradoxical motion; there is moderate to severe anterior wall      hypokinesis and apical akinesis.  Overall LV systolic function is      moderately to markedly impaired with an estimated ejection fraction      of 0.25.      Gerrit Friends. Dietrich Pates, MD, [facility]  Electronically Signed     RMR/MEDQ  D:  03/21/2006  T:  03/21/2006  Job:  [id number]

## 2010-08-29 NOTE — Cardiovascular Report (Signed)
[name] [name], [name] [name]              ACCOUNT NO.:  [id number]   MEDICAL RECORD NO.:  [id number]          PATIENT TYPE:  OIB   LOCATION:  NA                           FACILITY:  [facility]   PHYSICIAN:  Bruce R. Juanda Chance, MD, FACCDATE OF BIRTH:  12-24-1945   DATE OF PROCEDURE:  03/23/2006  DATE OF DISCHARGE:                            CARDIAC CATHETERIZATION   MEDICAL HISTORY:  [name] [name] is 65 years old and had bypass surgery 7  years ago at [facility].  She was recently admitted to [facility] with severe congestive heart failure.  She was seen by Dr.  Dietrich Pates and he arranged for her to have evaluation with angiography.  He did an echocardiogram which showed an ejection fraction of 25%.   PROCEDURE:  Left heart catheterization was performed percutaneously via  the right femoral artery using an arterial sheath and 4 French preformed  coronary catheters.  We had some difficulty with access because we could  not feel the right femoral pulse.  We could see heavy calcification in  the femoral artery.  We used this as a guidepost and also used the  Doppler needle and were able to access the vessel.  Right heart  catheterization was performed at the end the procedure via the right  femoral vein using a venous sheath and Swan-Ganz thermodilution  catheter.  We used a right bypass graft catheter for injection of the  vein graft to the right coronary artery.  The 3-D right catheter was  used for injection of the other grafts.  The patient tolerated the  procedure well and left the laboratory in satisfactory condition.   RESULTS:  The right atrial pressure was 10 mean. The pulmonary artery  pressure was 42/23 with mean of 32.  The pulmonary wedge pressure was 22  mean.  Left ventricle pressure is 122/15.  The aortic pressure is 122/69  with a mean of 93.  The cardiac output/cardiac index was 3.8/1.8  liters/minutes/meters square by Fick and 5.5/2.6 liters/minutes/meters  square by  thermodilution.   ANGIOGRAPHIC DATA:  The left main coronary had an 80% stenosis within  the stent in the left main.  The circumflex was occluded at  its origin  and LAD was completely occluded proximally.   THE RIGHT CORONARY artery was completely occluded proximally at the  location of a stent.   The vein graft to the posterior descending branch of the right coronary  artery was patent and functioned normally.  The posterior descending  branch was diffusely irregular with 40% stenosis before, proximal to,  and distal to the graft insertion site.   The vein graft to the marginal and posterolateral branch of the  circumflex artery was patent and functioned normally.   The vein graft to the ramus branch of the circumflex artery was patent  and functioned normally.   The LIMA graft to LAD was patent and functioned normally.   THE LEFT VENTRICULOGRAM showed global hypokinesis with an estimated  ejection fraction of 25%.   A distal aortogram showed patent renal arteries and irregular aorta but  no major obstruction.  There was 40% stenosis near the ostium of the  right iliac.   CONCLUSION:  1. Coronary artery disease status post remote coronary artery bypass      graft surgery at [facility].  2. Severe native vessel disease with 80% stenosis within the stent in      the left main coronary artery and total occlusion of the left      anterior descending artery, circumflex artery, and right coronary      artery.  3. Patent vein graft to the posterior descending branch of the right      coronary, patent vein graft to the obtuse marginal and      posterolateral branch of circumflex artery, patent vein graft to      the ramus branch of the circumflex artery, and patent LIMA graft to      LAD.  4. Severe left ventricular dysfunction with global hypokinesis and an      estimated ejection fraction of 25% with an elevated pulmonary wedge      pressure of 22.   RECOMMENDATIONS:   The patient's grafts are all patent and her  circulation looks good.  The main problem is severe left knee  dysfunction with recent congestive heart failure.  Will plan continued  medical management for this.  Her ejection fraction is low enough that  she should be considered for an ICD.  Her EKG shows a QRS of 108 and, if  this is persistent, she may not be a candidate for CRT.  Will plan to  arrange follow-up in the next week.      Bruce Elvera Lennox Juanda Chance, MD, [facility]  Electronically Signed     BRB/MEDQ  D:  03/23/2006  T:  03/23/2006  Job:  [id number]   cc:   Donna Bernard, M.D.  Gerrit Friends. Dietrich Pates, MD, [facility]
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
## 2010-08-29 NOTE — Procedures (Signed)
[name] [name], [name] [name]              ACCOUNT NO.:  [id number]   MEDICAL RECORD NO.:  [id number]          PATIENT TYPE:  INP   LOCATION:  A201                          FACILITY:  [facility]   PHYSICIAN:  Edward L. Juanetta Gosling, M.D.DATE OF BIRTH:  Aug 03, 1945   DATE OF PROCEDURE:  03/17/2006  DATE OF DISCHARGE:                              EKG INTERPRETATION   First, 1932 March 17, 2006: The rhythm is what appears to be a  supraventricular tachycardia with a rate of about 110.  There is left  bundle branch block.  Abnormal electrocardiogram.      Oneal Deputy. Juanetta Gosling, M.D.  Electronically Signed     ELH/MEDQ  D:  03/19/2006  T:  03/19/2006  Job:  [id number]

## 2010-08-29 NOTE — H&P (Signed)
[facility]  Patient:    [name] [name], SCHILD Visit Number: [id number] MRN: [id number]          Service Type: OBV Location: 2A A226 01 Attending Physician:  Hilario Quarry Dictated by:   Lilyan Punt, M.D. Admit Date:  09/16/2001                           History and Physical  CHIEF COMPLAINT:  Abdominal discomfort, nausea and vomiting.  HISTORY OF PRESENT ILLNESS:  This 65 year old white female with history of diabetes, coronary artery disease, peptic ulcers and CHF presents after having significant abdominal discomfort, nausea and vomiting that began the night before progressive throughout the day. The abdominal discomfort she describes as being generalized in the mid to upper abdomen radiates in both directions and causes her to feel extremely nauseated. She vomits with just about any movements. She states even movement of her eyes triggers vomiting. She denies any chest pressure, tightness, heaviness or shortness of breath. She denies any sweats or diaphoresis. No fevers, chills, dysuria, denies diarrhea. She does state she ate some oyster stew the night before.  PAST MEDICAL HISTORY:  Diabetes mellitus type 2, peptic ulcers with positive H. pylori in 1998, multivessel coronary artery disease, CHF, history of stents.  FAMILY HISTORY:  Diabetes and heart disease.  SOCIAL HISTORY:  Divorced. Does not smoke or drink.  ALLERGIES:  States DAYPRO causes swelling and AVANDIA causes swelling.  MEDICATIONS:  1. Atenolol 50 mg q.d.  2. Altace 10 mg q.d.  3. Plavix 75 mg q.d.  4. Lasix 40 mg 1/2 daily.  5. Vitamin E 400 units q.d.  6. Coated aspirin one daily.  7. Multivitamin q.d.  8. Lipitor 10 mg q.d.  9. Novolin 70/30 60 units b.i.d. 10. Aldactone 25 mg q.d.  LABORATORY DATA:  Normal white count, normal hemoglobin. Sodium 134, potassium 4.4, chloride 102, BUN 10, creatinine 0.6, glucose 297. Liver enzymes negative. Amylase and lipase  negative. Total creatine kinase 131, CK-MB 8.9, troponin 0.26.  EKG shows normal sinus rhythm, slight ST segment depression in lead 1 with flattening in aVL. Q waves are present in II, III, aVF and poor R-wave progression is present in the precordial leads. Abnormal EKG. Ultrasound showed fatty infiltration of the liver.  PHYSICAL EXAMINATION:  HEENT:  Benign.  NECK:  No masses.  CHEST:  CTA, R&L.  HEART:  Regular, chest wall nontender.  VITAL SIGNS:  Pulse normal, blood pressure good.  ABDOMEN:  Soft throughout, no guarding or rebound or discomfort currently. The patient just relates feeling nauseated although there is some slight tenderness in the periumbilical area.  EXTREMITIES:  No edema.  ASSESSMENT/PLAN:  1. Gastroenteritis--Given that she had some oyster stew and she is diabetic     will cover with fluoroquinolone, could well be a viral gastroenteritis.     Doubt gallbladder dysfunction.  2. Borderline cardiac enzymes--will monitor patient closely. Could be an     evolving silent MI, but at this point in time do not find out right     evidence to justify the use of other IV medications or catheterization     at the moment. Will recheck enzymes again to be on the safe side in     about 8-12 hours and place the patient on monitor. She is unable to     tolerate any of her oral medicines currently. Dictated by:   Lilyan Punt, M.D. Attending Physician:  Hilario Quarry DD:  09/17/01 TD:  09/17/01 Job: [id number]

## 2010-08-29 NOTE — Discharge Summary (Signed)
[facility]  Patient:    [name] [name], [name] [name] Visit Number: [id number] MRN: [id number]          Service Type: OBV Location: 2A A226 01 Attending Physician:  Harlow Asa Dictated by:   Lilyan Punt, M.D. Admit Date:  09/16/2001 Discharge Date: 09/17/2001                             Discharge Summary  DISCHARGE DIAGNOSES: 1. Abdominal pain with vomiting. 2. Myocardial infarction. 3. Diabetes.  HOSPITAL COURSE:  Patient was admitted in originally with abdominal discomfort, nausea, and vomiting.  Her enzymes were followed while she was in the hospital and they dramatically went up.  Patient did not experience any chest pressure, tightness, shortness of breath, and her abdominal pain resolved along with the nausea and vomiting.  EKG did show some slight changes.  It was concerning that the patient may have further potential for further cardiac trouble in the near future.  Therefore, it was felt necessary to transfer her to her cardiologists care.  Dr. Sabino Donovan at [facility] accepted her for her cardiologist, Dr. Thayer Ohm.  Patient was transferred in stable condition.  Patient was encouraged to follow up with [place] after her discharge from the hospital. Dictated by:   Lilyan Punt, M.D. Attending Physician:  Harlow Asa DD:  09/18/01 TD:  09/20/01 Job: [id number]

## 2010-08-29 NOTE — Procedures (Signed)
[name] [name], [name] [name]              ACCOUNT NO.:  [id number]   MEDICAL RECORD NO.:  [id number]          PATIENT TYPE:  INP   LOCATION:  A201                          FACILITY:  [facility]   PHYSICIAN:  Edward L. Juanetta Gosling, M.D.DATE OF BIRTH:  1945/11/01   DATE OF PROCEDURE:  03/18/2006  DATE OF DISCHARGE:                              EKG INTERPRETATION   Time 4:39 March 18, 2006:  The rhythm is a sinus rhythm with a rate in  the 90s.  There is first-degree AV block.  There is left bundle branch  block.  Abnormal electrocardiogram.      Oneal Deputy. Juanetta Gosling, M.D.  Electronically Signed     ELH/MEDQ  D:  03/19/2006  T:  03/19/2006  Job:  [id number]

## 2010-08-29 NOTE — Consult Note (Signed)
[name] [name], [name] [name]              ACCOUNT NO.:  [id number]   MEDICAL RECORD NO.:  [id number]          PATIENT TYPE:  INP   LOCATION:  A201                          FACILITY:  [facility]   PHYSICIAN:  Gerrit Friends. Dietrich Pates, MD, FACCDATE OF BIRTH:  07-18-1945   DATE OF CONSULTATION:  03/18/2006  DATE OF DISCHARGE:                                 CONSULTATION   REFERRING PHYSICIAN:  Dr. Lilyan Punt.   PRIMARY CARDIOLOGIST:  Previously Dr. Jens Som.   HISTORY OF PRESENT ILLNESS:  A 65 year old woman who now presents with  the new onset of congestive heart failure.  Ms. Mcleroy cardiac history  dates to approximately seven years ago when she presented with chest  discomfort and a positive stress nuclear study.  Coronary angiography  revealed left main and three-vessel coronary disease with preserved left  ventricular systolic function.  She was referred to Dr. Barry Dienes who  indicated that he did not find her to be an acceptable surgical risk,  presumably due to obesity, diabetes and her coronary anatomy.  She  subsequently sought cardiology follow up in [place] and underwent  percutaneous intervention and stenting.  The details of those procedures  are not available.  Ultimately, she was referred to a surgeon at  [facility] who performed uncomplicated CABG surgery.  She had  been well in the intervening years until the night of admission when she  developed severe dyspnea at rest associated with mild chest tightness,  prompting presentation to the emergency department where congestive  heart failure was apparent by exam and chest x-ray.   Cardiovascular risk factors include longstanding diabetes.  She has not  had significant hypertension.  She has not used tobacco products for  many years.   PAST MEDICAL HISTORY:  1. Otherwise notable for appendectomy.  2. Tubal ligation.  3. Tonsillectomy.   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   FAMILY HISTORY:  Father died at an advanced age due to myocardial  infarction; mother also had coronary disease and had undergone CABG  surgery before death at age 25.  A sister also died at age 18 due to  coronary disease.   REVIEW OF SYSTEMS:  Is notable for GERD symptoms and long history of  excessive weight.  She has had some exertional symptoms consistent with  claudication.  All other systems reviewed and are negative.   MEDICATIONS ON ADMISSION:  1. Included enalapril 1 tablet daily.  2. KCL taken on a b.i.d. basis.  3. Furosemide dose unknown.  4. Humalog per sliding-scale.  5. Lantus insulin 50-60 units daily.  6. Aspirin 325 mg daily.  7. Synthroid, Vytorin, Januvia, Actos, metformin and metoprolol, all      at unknown doses.   Overweight pleasant woman in no acute distress.  The blood pressure is  120/65, heart rate 85 and regular, respirations 18, temperature 97.6, O2  saturation 98% on 2 liters.  HEENT:  Anicteric sclerae; normal lids and conjunctiva.  NECK:  Mild jugular venous  distension; normal carotid upstrokes without  bruits.  ENDOCRINE:  No thyromegaly.  HEMATOPOIETIC:  No adenopathy.  SKIN:  No significant lesions.  LUNGS:  Clear.  CARDIAC:  Normal first and second heart sounds; fourth heart sound  present.  ABDOMEN:  Soft and nontender; no organomegaly.  EXTREMITIES:  No edema; distal pulses intact.   Cardiac markers notable for slightly elevated troponin with negative CPK  and CPK-MB.   EKG:  Normal sinus rhythm and sinus tachycardia; left bundle branch  block.   IMPRESSION:  Ms. Vanes presents seven years after coronary artery  bypass graft surgery with the sudden onset of congestive heart failure.  She does recall that at the time of her surgery she was still having  left ventricular dysfunction.  This will be assessed with  echocardiography.  If she has  normal left ventricular systolic function,  I would be concerned about ischemia as the cause of her sudden onset of  congestive heart failure with mild chest discomfort.  If she has left  ventricular dysfunction, she may have progression of coronary disease or  graft disease causing that dysfunction.  Accordingly, she will certainly  require repeat cardiac catheterization.  Since her cardiologist and  cardiovascular surgeon are both on the staff at [facility], that  would probably be the preferred venue for further testing.  After  initial results are obtained, we will contact those physicians and  arrange for additional testing and follow-up.      Gerrit Friends. Dietrich Pates, MD, [facility]  Electronically Signed     RMR/MEDQ  D:  03/18/2006  T:  03/18/2006  Job:  [id number]

## 2010-08-29 NOTE — Assessment & Plan Note (Signed)
[facility]                            CARDIOLOGY OFFICE NOTE   NAME:[name], [name]                     MRN:          [id number]  DATE:06/07/2006                            DOB:          [birth year]    PRIMARY CARE PHYSICIAN:  [name] [name].   CLINICAL COURSE:  [name] [name] is 65 years old and returns for management  of her ischemic cardiomyopathy. She had bypass surgery in 2001 at  [facility]. She was admitted last December to [facility]  with congestive heart failure and had an ejection fraction of 25% by  echo. She was referred by [name] [name] for outpatient catheterization  and we found that all her grafts were patent and her ejection fraction  was 25% and her wedge was 22. We adjusted her medications and she has  done well since that time. She saw [name] [name] on December 20 for a  possible defibrillator but he told her to hold off for the present time.  We do not have a note from that visit. She says she has had no symptoms  of shortness of breath, palpitations or chest pain.   PAST MEDICAL HISTORY:  Significant for diabetes and hyperlipidemia.   CURRENT MEDICATIONS:  Potassium, furosemide, Coreg, Vytorin, Synthroid,  fish oil, Actos, Lantus insulin, and enalapril.   PHYSICAL EXAMINATION:  VITAL SIGNS:  Blood pressure was 133/69, pulse 75  and regular.  NECK:  There was no vein distention. Carotid pulses were full without  bruits.  CHEST:  Clear.  HEART:  Rhythm was regular. I could hear no murmurs or gallops.  ABDOMEN:  Soft without organomegaly.  EXTREMITIES:  Peripheral pulses were full and there was no peripheral  edema.   Her previous ECGs have shown left bundle branch block.   IMPRESSION:  1. Coronary artery disease status post coronary artery bypass graft      surgery at [facility] 2001.  2. Ischemic cardiomyopathy with an ejection fraction of 25%.  3. Patent grafts at last catheterization December 2007.  4. Class 2 congestive heart failure.  5. Insulin dependent diabetes.  6. Hyperlipidemia.   RECOMMENDATIONS:  I think [name] [name] is doing well. We will plan to  titrate her enalapril from 5 b.i.d. to 10 b.i.d. We will get a BNP today  and another one in a week. I discussed her situation with [name] [name] and  we will plan to repeat her echo in the next 1-2 weeks. If her ejection  fraction remains significantly depressed then I will have her see Dr.  Graciela [name] back for consideration for an ICD. [name] [name] indicated to me that  MADIT-CRT will be closing so she will not be a candidate for that study.  I will discuss with [name] [name] her left bundle branch block and severe  left  ventricular dysfunction whether we should consider CRT therapy at the  time of ICD implantation despite the fact that she has only class II  congestive heart failure.     [name] Elvera Lennox Juanda Chance, MD, [facility]  Electronically Signed    BRB/MedQ  DD: 06/07/2006  DT: 06/07/2006  Job #: [id number]   cc:   [name] [name], M.D.

## 2010-08-29 NOTE — Procedures (Signed)
[facility]  Patient:    [name] [name], [name] [name] Visit Number: [id number] MRN: [id number]          Service Type: OBV Location: 2A A226 01 Attending Physician:  Harlow Asa Dictated by:   Lilyan Punt, M.D. Proc. Date: 09/17/01 Admit Date:  09/16/2001 Discharge Date: 09/17/2001                            EKG Interpretations  Slight ST segment depression in lead 1 along with lead 2.  Septal Qs in the inferior lead.  No acute changes.  Otherwise abnormal EKG. Dictated by:   Lilyan Punt, M.D. Attending Physician:  Harlow Asa DD:  10/16/01 TD:  10/18/01 Job: [id number]

## 2010-08-29 NOTE — Discharge Summary (Signed)
[name], [name]              ACCOUNT NO.:  [id number]   MEDICAL RECORD NO.:  [id number]          PATIENT TYPE:  INP   LOCATION:  A201                          FACILITY:  [facility]   PHYSICIAN:  Donna Bernard, M.D.DATE OF BIRTH:  [birth year]   DATE OF ADMISSION:  03/17/2006  DATE OF DISCHARGE:  12/07/2007LH                               DISCHARGE SUMMARY   FINAL DIAGNOSES:  1. Congestive heart failure.  2. Coronary artery disease.  3. Chest pain.  4. Hypertension.  5. Type 2 diabetes.   DISPOSITION:  1. Patient discharged home.  2. Patient to follow up with cardiologist for potential      catheterization.   DISCHARGE MEDICATIONS:  1. Coreg 6.25 b.i.d.  2. Aldactone  25 mg daily  Other meds as noted in the chart.   Schedule cardiac catheterization [facility] hospital on March 23, 2006.   INITIAL HISTORY AND PHYSICAL:  Please see H&P as dictated.   HOSPITAL COURSE:  This patient is a 65 year old white female with a  history of known coronary artery disease and congestive heart failure  along with type 2 diabetes.  She presented to the office on the date of  admission with acute shortness of breath.  On exam and x-ray, she was  found to have congestive heart failure.  She was seen by the  cardiologist.  Her diuretics were adjusted.  The patient's pain and  discomfort improved.  Cardiac enzymes were negative.  Due to her  significant history, a catheterization was recommended and set up by the  cardiologist.  The day of discharge from the hospital here, the patient  was feeling better and breathing easier.  The patient was discharged  with a diagnosis and disposition as noted above.      Donna Bernard, M.D.  Electronically Signed     WSL/MEDQ  D:  05/03/2006  T:  05/03/2006  Job:  [id number]

## 2010-08-29 NOTE — Assessment & Plan Note (Signed)
[facility]                            CARDIOLOGY OFFICE NOTE   NAME:[name], [name] VECCHIO                     MRN:          [id number]  DATE:04/27/2006                            DOB:          [birth year]    The patient was seen at the [facility] on April 27, 2006, for  Dr. Samule Ohm. This is a patient of Dr. Charlies Constable.   This is a 65 year old white female patient of Dr. Juanda Chance who has a  history of coronary artery disease status post CABG at [place] 7 years  ago. She was admitted recently to [facility] with heart failure and was  found to have an ejection fraction of 25%. Cardiac cath at that time  showed patent grafts. She was placed on carvedilol and titrated up and  she was also evaluated by Dr. Graciela Husbands for possible defibrillator. We do  not have a dictated note from him, but she says he told her that she did  not need the defibrillator at this time. Overall, she has done  remarkably well. She denies any shortness of breath, dyspnea on  exertion, paroxysmal nocturnal dyspnea or orthopnea. She says she can  walk quite a long ways before she gets out of breath. She says it has  taken her a long time, but she is finally taking care of herself and  watching her diet.   CURRENT MEDICATIONS:  1. Klor-Con 20 mEq b.i.d.  2. Furosemide 40 mg t.i.d.  3. Amoxicillin 500 t.i.d.  4. Vitamin E daily.  5. Vytorin 10/20 mg daily.  6. Synthroid 100 mcg daily.  7. Fish oil daily.  8. Multivitamin daily.  9. Aspirin 325 daily.  10.Alprazolam 0.5 nightly.  11.Actos/metformin 850/15 b.i.d.  12.Cyclobenzaprine 10 mg daily.  13.Lantus insulin and Humalog sliding scale daily.  14.Coreg 12.5 mg b.i.d.  15.Enalapril 5 mg b.i.d.   PHYSICAL EXAMINATION:  This is a very pleasant 65 year old white female  in no acute distress. Blood pressure 133/74, pulse 83. Weight is 234.  NECK: Without JVD, HJR, bruit or thyroid enlargement.  LUNGS:  Are clear anterior,  posterior and lateral.  HEART: Regular rate and rhythm at 80 beats per minute. Normal S1, S2  without a 2/6 systolic ejection murmur at the left sternal border. Heart  sounds are distant.  ABDOMEN: Obese. Normoactive bowel sounds are heard throughout.  EXTREMITIES: Without cyanosis, clubbing or edema. She has good distal  pulses.   EKG: Normal sinus rhythm with left bundle branch block.   IMPRESSION:  1. Coronary artery disease, status post coronary artery bypass graft      at [facility] 7 years ago.  2. Ischemic cardiomyopathy, ejection fraction 25% cardiac cath in      December 2007. All grafts patent and severe native vessel disease      with 80% stenosis within the stent in the left main. Medical      therapy recommended.  3. Insulin dependent diabetes mellitus.  4. Hyperlipidemia.  5. Hypothyroidism.   PLAN:  At this time, the patient is doing remarkably well from a cardiac  standpoint. Will titrate her  Coreg up to 25 mg b.i.d. She has an  appointment to see Dr. Juanda Chance back this Monday, but we will reschedule  it for 6 weeks since she is doing so well and he can follow up her  medications at that time.      Jacolyn Reedy, PA-C  Electronically Signed      Salvadore Farber, MD  Electronically Signed   ML/MedQ  DD: 04/27/2006  DT: 04/27/2006  Job #: [id number]

## 2010-08-29 NOTE — Discharge Summary (Signed)
[name] [name], [name] [name]              ACCOUNT NO.:  [id number]   MEDICAL RECORD NO.:  [id number]          PATIENT TYPE:  INP   LOCATION:  A201                          FACILITY:  [facility]   PHYSICIAN:  Donna Bernard, M.D.DATE OF BIRTH:  1945-10-06   DATE OF ADMISSION:  03/17/2006  DATE OF DISCHARGE:  12/07/2007LH                               DISCHARGE SUMMARY   ADDENDUM:  The patient also noted leg pain the day after she presented  to the hospital and pain that was significant for potential ischemia.  We did bilateral Doppler evaluations and the patient indeed had moderate  depression in the resting ankle brachial indices with a value of 0.62  and 0.58 on the left.  The patient was advised she needed to discuss  this with her cardiologist about further assessment and potential  intervention.      Donna Bernard, M.D.  Electronically Signed     WSL/MEDQ  D:  05/03/2006  T:  05/03/2006  Job:  [id number]

## 2010-10-17 ENCOUNTER — Encounter: Payer: Self-pay | Admitting: Internal Medicine

## 2010-10-21 ENCOUNTER — Encounter: Payer: Self-pay | Admitting: Internal Medicine

## 2010-11-13 ENCOUNTER — Encounter: Payer: Self-pay | Admitting: Internal Medicine

## 2010-11-28 ENCOUNTER — Other Ambulatory Visit: Payer: Self-pay | Admitting: Endocrinology

## 2010-12-02 ENCOUNTER — Telehealth: Payer: Self-pay | Admitting: Internal Medicine

## 2010-12-02 ENCOUNTER — Other Ambulatory Visit: Payer: Self-pay

## 2010-12-02 ENCOUNTER — Telehealth: Payer: Self-pay

## 2010-12-02 MED ORDER — ENALAPRIL MALEATE 10 MG PO TABS: 10.0000 mg | ORAL_TABLET | Freq: Two times a day (BID) | ORAL | Status: DC

## 2010-12-02 NOTE — Telephone Encounter (Signed)
..   Requested Prescriptions   Signed Prescriptions Disp Refills  . enalapril (VASOTEC) 10 MG tablet 60 tablet 6    Sig: Take 1 tablet (10 mg total) by mouth 2 (two) times daily.    Authorizing Provider: Lewayne Bunting    Ordering User: Lacie Scotts   Sent back to [facility].

## 2010-12-02 NOTE — Telephone Encounter (Signed)
Pt needs enalapril to be call in to [facility] pharmacy # [phone]

## 2010-12-03 ENCOUNTER — Encounter: Payer: Medicare Other | Admitting: Internal Medicine

## 2011-01-02 LAB — TROPONIN I: Troponin I: 0.03

## 2011-01-02 LAB — CBC
HCT: 27.4 — ABNORMAL LOW
HCT: 29.4 — ABNORMAL LOW
Hemoglobin: 10.8 — ABNORMAL LOW
Hemoglobin: 10.1 — ABNORMAL LOW
Hemoglobin: 11 — ABNORMAL LOW
Hemoglobin: 9.3 — ABNORMAL LOW
MCHC: 33.9
MCHC: 34.2
MCV: 90.6
RBC: 3.49 — ABNORMAL LOW
RBC: 3.02 — ABNORMAL LOW
RBC: 3.25 — ABNORMAL LOW
RBC: 3.55 — ABNORMAL LOW
RDW: 14.1
RDW: 13.9
RDW: 14.1
WBC: 11 — ABNORMAL HIGH
WBC: 11.8 — ABNORMAL HIGH

## 2011-01-02 LAB — DIFFERENTIAL
Basophils Absolute: 0
Eosinophils Relative: 3
Eosinophils Relative: 3
Lymphocytes Relative: 20
Lymphocytes Relative: 25
Monocytes Absolute: 0.8
Monocytes Relative: 10
Neutro Abs: 4.8

## 2011-01-02 LAB — LIPID PANEL
HDL: 29 — ABNORMAL LOW
Total CHOL/HDL Ratio: 3.4

## 2011-01-02 LAB — BASIC METABOLIC PANEL
CO2: 27
CO2: 30
Calcium: 9
Calcium: 8.6
Chloride: 102
GFR calc Af Amer: >60
GFR calc Af Amer: >60
GFR calc Af Amer: >60
GFR calc non Af Amer: >60
GFR calc non Af Amer: >60
Glucose, Bld: 76
Glucose, Bld: 103 — ABNORMAL HIGH
Glucose, Bld: 63 — ABNORMAL LOW
Potassium: 3.8
Potassium: 4.3
Sodium: 138
Sodium: 136
Sodium: 140

## 2011-01-02 LAB — B-NATRIURETIC PEPTIDE (CONVERTED LAB): Pro B Natriuretic peptide (BNP): 802 — ABNORMAL HIGH

## 2011-01-02 LAB — MAGNESIUM: Magnesium: 2.2

## 2011-01-02 LAB — CARDIAC PANEL(CRET KIN+CKTOT+MB+TROPI)
Relative Index: INVALID
Relative Index: INVALID
Relative Index: INVALID
Total CK: 77
Troponin I: 0.02

## 2011-01-02 LAB — COMPREHENSIVE METABOLIC PANEL
BUN: 22
CO2: 24
Calcium: 9.1
Creatinine, Ser: 0.88
GFR calc Af Amer: >60
GFR calc non Af Amer: >60
Glucose, Bld: 172 — ABNORMAL HIGH

## 2011-01-02 LAB — PROTIME-INR
INR: 1.1
Prothrombin Time: 14.1

## 2011-01-02 LAB — APTT: aPTT: 30

## 2011-01-02 LAB — CK TOTAL AND CKMB (NOT AT ARMC)
CK, MB: 1
Total CK: 51

## 2011-01-05 ENCOUNTER — Other Ambulatory Visit: Payer: Self-pay | Admitting: Internal Medicine

## 2011-01-05 ENCOUNTER — Other Ambulatory Visit: Payer: Self-pay | Admitting: Endocrinology

## 2011-01-08 ENCOUNTER — Other Ambulatory Visit: Payer: Self-pay | Admitting: *Deleted

## 2011-01-08 MED ORDER — FUROSEMIDE 40 MG PO TABS: 40.0000 mg | ORAL_TABLET | Freq: Two times a day (BID) | ORAL | Status: DC

## 2011-03-24 ENCOUNTER — Other Ambulatory Visit: Payer: Self-pay

## 2011-03-25 ENCOUNTER — Other Ambulatory Visit: Payer: Self-pay

## 2011-03-25 MED ORDER — POTASSIUM CHLORIDE CRYS ER 20 MEQ PO TBCR: 20.0000 meq | EXTENDED_RELEASE_TABLET | Freq: Every day | ORAL | Status: DC

## 2011-03-25 MED ORDER — CARVEDILOL 25 MG PO TABS: 25.0000 mg | ORAL_TABLET | Freq: Two times a day (BID) | ORAL | Status: DC

## 2011-04-24 ENCOUNTER — Other Ambulatory Visit: Payer: Self-pay | Admitting: *Deleted

## 2011-04-24 MED ORDER — FUROSEMIDE 40 MG PO TABS: 40.0000 mg | ORAL_TABLET | Freq: Two times a day (BID) | ORAL | Status: DC

## 2011-04-27 ENCOUNTER — Other Ambulatory Visit: Payer: Self-pay | Admitting: *Deleted

## 2011-04-27 MED ORDER — POTASSIUM CHLORIDE CRYS ER 20 MEQ PO TBCR: 20.0000 meq | EXTENDED_RELEASE_TABLET | Freq: Every day | ORAL | Status: DC

## 2011-05-13 ENCOUNTER — Ambulatory Visit: Payer: Medicare Other | Admitting: Cardiology

## 2011-05-13 ENCOUNTER — Encounter: Payer: Self-pay | Admitting: Cardiology

## 2011-05-14 DIAGNOSIS — E785 Hyperlipidemia, unspecified: Secondary | ICD-10-CM | POA: Diagnosis not present

## 2011-05-14 DIAGNOSIS — I10 Essential (primary) hypertension: Secondary | ICD-10-CM | POA: Diagnosis not present

## 2011-05-14 DIAGNOSIS — E890 Postprocedural hypothyroidism: Secondary | ICD-10-CM | POA: Diagnosis not present

## 2011-05-14 DIAGNOSIS — E559 Vitamin D deficiency, unspecified: Secondary | ICD-10-CM | POA: Diagnosis not present

## 2011-05-14 DIAGNOSIS — E669 Obesity, unspecified: Secondary | ICD-10-CM | POA: Diagnosis not present

## 2011-05-21 ENCOUNTER — Ambulatory Visit (INDEPENDENT_AMBULATORY_CARE_PROVIDER_SITE_OTHER): Payer: Medicare Other | Admitting: Cardiology

## 2011-05-21 ENCOUNTER — Encounter: Payer: Self-pay | Admitting: Cardiology

## 2011-05-21 VITALS — BP 157/78 | HR 79 | Ht 67.0 in | Wt 255.0 lb

## 2011-05-21 DIAGNOSIS — I429 Cardiomyopathy, unspecified: Secondary | ICD-10-CM

## 2011-05-21 DIAGNOSIS — E782 Mixed hyperlipidemia: Secondary | ICD-10-CM | POA: Diagnosis not present

## 2011-05-21 DIAGNOSIS — I2510 Atherosclerotic heart disease of native coronary artery without angina pectoris: Secondary | ICD-10-CM | POA: Diagnosis not present

## 2011-05-21 DIAGNOSIS — Z95810 Presence of automatic (implantable) cardiac defibrillator: Secondary | ICD-10-CM

## 2011-05-21 DIAGNOSIS — E119 Type 2 diabetes mellitus without complications: Secondary | ICD-10-CM | POA: Diagnosis not present

## 2011-05-21 NOTE — Assessment & Plan Note (Signed)
No active angina on medical therapy. ECG shows paced rhythm. No changes to present regimen. Followup arranged.

## 2011-05-21 NOTE — Assessment & Plan Note (Signed)
For device check with Dr. Ladona Ridgel soon.

## 2011-05-21 NOTE — Assessment & Plan Note (Signed)
Need followup assessment of LVEF, repeat echocardiogram to be obtained. Had improved to 45-50% previously.

## 2011-05-21 NOTE — Patient Instructions (Addendum)
Your physician recommends that you schedule a follow-up appointment in: 6 months  Your physician has recommended you make the following change in your medication:  1 - KCL once daily  2 - Lasix once daily  Your physician has requested that you have an echocardiogram. Echocardiography is a painless test that uses sound waves to create images of your heart. It provides your doctor with information about the size and shape of your heart and how well your heart's chambers and valves are working. This procedure takes approximately one hour. There are no restrictions for this procedure.

## 2011-05-21 NOTE — Progress Notes (Signed)
Addended by: Reather Laurence A on: 05/21/2011 03:56 PM   Modules accepted: Orders

## 2011-05-21 NOTE — Assessment & Plan Note (Signed)
Will request most recent lab work for review. 

## 2011-05-21 NOTE — Progress Notes (Signed)
Clinical Summary Ms. Ewings is a 66 y.o.female presenting for followup, a former patient of Dr. Juanda Chance. This is my first meeting with her. She saw Dr. Juanda Chance back in February of 2011.  She reports no chest pain or progressive dyspnea. She is due to see Dr. Ladona Ridgel for device check soon.  Labwork has been followed with her endocrinologist Dr. Fransico Him, to be requested for review.  She has had no palpitations or syncope. Does not follow a regular exercise plan. Reports stable appetite, no orthopnea or PND.  No Known Allergies  Current Outpatient Prescriptions  Medication Sig Dispense Refill  . ALPRAZolam (XANAX) 1 MG tablet Take 1 mg by mouth at bedtime as needed.        Marland Kitchen aspirin 81 MG tablet Take 81 mg by mouth daily.        . carvedilol (COREG) 25 MG tablet Take 1 tablet (25 mg total) by mouth 2 (two) times daily with a meal.  30 tablet  3  . enalapril (VASOTEC) 10 MG tablet Take 1 tablet (10 mg total) by mouth 2 (two) times daily.  60 tablet  6  . fenofibrate 160 MG tablet Take 160 mg by mouth at bedtime.        . furosemide (LASIX) 40 MG tablet Take 40 mg by mouth daily.      . insulin aspart (NOVOLOG) 100 UNIT/ML injection Inject 100 Units into the skin 3 (three) times daily before meals.      . insulin glargine (LANTUS) 100 UNIT/ML injection Inject 10 Units into the skin at bedtime.        Marland Kitchen levothyroxine (SYNTHROID, LEVOTHROID) 100 MCG tablet Take 100 mcg by mouth daily.        . multivitamin (THERAGRAN) per tablet Take 1 tablet by mouth daily.        . Omega-3-acid Ethyl Esters (LOVAZA PO) Take by mouth daily.        . potassium chloride SA (K-DUR,KLOR-CON) 20 MEQ tablet Take 20 mEq by mouth daily.       . simvastatin (ZOCOR) 80 MG tablet TAKE ONE TABLET DAILY AT BEDTIME  30 tablet  1  . vitamin E 600 UNIT capsule Take 600 Units by mouth daily.          Past Medical History  Diagnosis Date  . Chronic systolic heart failure   . Diabetes mellitus, type 2   . Hyperlipidemia   .  Essential hypertension, benign   . Hypothyroidism   . PVD (peripheral vascular disease)   . Coronary atherosclerosis of native coronary artery     Multivessel, patent grafts 2007  . Ischemic cardiomyopathy     Ejection Fraction of 25% improved to 45-50% following biventicular pacing, Status post biventricular dual-chamber ICD - [employer]  . Hypothyroidism     Past Surgical History  Procedure Date  . Coronary artery bypass graft 2001    [facility] - LIMA to LAD, SVG to ramus, SVG to OM/PLB, SVG to RCA   . Appendectomy   . Bilateral tubal ligation   . Cardiac defibrillator placement     [facility]    Family History  Problem Relation Age of Onset  . Diabetes Mother   . Diabetes Sister   . Diabetes Father   . Heart attack Father   . Coronary artery disease Sister 50    Social History Ms. Fabio reports that she has never smoked. She has never used smokeless tobacco. Ms. Abundis reports that  she does not drink alcohol.  Review of Systems No bleeding problems. Otherwise negative except as outlined.  Physical Examination Filed Vitals:   05/21/11 1446  BP: 157/78  Pulse: 79   Obese woman in NAD. HEENT: Conjunctiva and lids normal, oropharynx clear with moist mucosa. Neck: Supple, increased girth, no carotid bruits, no thyromegaly. Lungs: Clear to auscultation, nonlabored breathing at rest. Cardiac: Regular rate and rhythm, no S3, no pericardial rub. Abdomen: Soft, nontender, bowel sounds present, no guarding or rebound. Extremities: No pitting edema, distal pulses 2+. Skin: Warm and dry. Musculoskeletal: No kyphosis. Neuropsychiatric: Alert and oriented x3, affect grossly appropriate.   ECG Paced rhythm with PVC.   Problem List and Plan

## 2011-05-21 NOTE — Assessment & Plan Note (Signed)
Followed by Dr. Nida. 

## 2011-05-27 ENCOUNTER — Ambulatory Visit (HOSPITAL_COMMUNITY)
Admission: RE | Admit: 2011-05-27 | Discharge: 2011-05-27 | Disposition: A | Discharge status: Home / Self Care | Payer: Medicare Other | Source: Ambulatory Visit | Attending: Cardiology | Admitting: Cardiology

## 2011-05-27 ENCOUNTER — Other Ambulatory Visit: Payer: Self-pay | Admitting: Endocrinology

## 2011-05-27 DIAGNOSIS — E119 Type 2 diabetes mellitus without complications: Secondary | ICD-10-CM | POA: Insufficient documentation

## 2011-05-27 DIAGNOSIS — R0609 Other forms of dyspnea: Secondary | ICD-10-CM | POA: Insufficient documentation

## 2011-05-27 DIAGNOSIS — E782 Mixed hyperlipidemia: Secondary | ICD-10-CM

## 2011-05-27 DIAGNOSIS — R0989 Other specified symptoms and signs involving the circulatory and respiratory systems: Secondary | ICD-10-CM | POA: Insufficient documentation

## 2011-05-27 DIAGNOSIS — Z95810 Presence of automatic (implantable) cardiac defibrillator: Secondary | ICD-10-CM | POA: Diagnosis not present

## 2011-05-27 DIAGNOSIS — I2510 Atherosclerotic heart disease of native coronary artery without angina pectoris: Secondary | ICD-10-CM

## 2011-05-27 DIAGNOSIS — I10 Essential (primary) hypertension: Secondary | ICD-10-CM | POA: Diagnosis not present

## 2011-05-27 DIAGNOSIS — I429 Cardiomyopathy, unspecified: Secondary | ICD-10-CM

## 2011-05-27 DIAGNOSIS — I359 Nonrheumatic aortic valve disorder, unspecified: Secondary | ICD-10-CM | POA: Diagnosis not present

## 2011-05-27 NOTE — Progress Notes (Signed)
*  PRELIMINARY RESULTS* Echocardiogram 2D Echocardiogram has been performed.  [name] [name] 05/27/2011, 9:11 AM

## 2011-05-28 ENCOUNTER — Other Ambulatory Visit: Payer: Self-pay

## 2011-05-28 MED ORDER — POTASSIUM CHLORIDE CRYS ER 20 MEQ PO TBCR: 20.0000 meq | EXTENDED_RELEASE_TABLET | Freq: Every day | ORAL | Status: DC

## 2011-06-03 DIAGNOSIS — G8929 Other chronic pain: Secondary | ICD-10-CM | POA: Diagnosis not present

## 2011-06-03 DIAGNOSIS — E785 Hyperlipidemia, unspecified: Secondary | ICD-10-CM | POA: Diagnosis not present

## 2011-06-03 DIAGNOSIS — I259 Chronic ischemic heart disease, unspecified: Secondary | ICD-10-CM | POA: Diagnosis not present

## 2011-06-03 DIAGNOSIS — I10 Essential (primary) hypertension: Secondary | ICD-10-CM | POA: Diagnosis not present

## 2011-06-04 ENCOUNTER — Encounter: Payer: Self-pay | Admitting: Internal Medicine

## 2011-06-04 ENCOUNTER — Ambulatory Visit (INDEPENDENT_AMBULATORY_CARE_PROVIDER_SITE_OTHER): Payer: Medicare Other | Admitting: Internal Medicine

## 2011-06-04 DIAGNOSIS — I10 Essential (primary) hypertension: Secondary | ICD-10-CM | POA: Diagnosis not present

## 2011-06-04 DIAGNOSIS — I2510 Atherosclerotic heart disease of native coronary artery without angina pectoris: Secondary | ICD-10-CM | POA: Diagnosis not present

## 2011-06-04 DIAGNOSIS — I429 Cardiomyopathy, unspecified: Secondary | ICD-10-CM

## 2011-06-04 DIAGNOSIS — Z95810 Presence of automatic (implantable) cardiac defibrillator: Secondary | ICD-10-CM

## 2011-06-04 LAB — ICD DEVICE OBSERVATION
AL AMPLITUDE: 2.1 mv
AL IMPEDENCE ICD: 523 Ohm
AL THRESHOLD: 1.2 V
BATTERY VOLTAGE: 2.9 V
RV LEAD THRESHOLD: 0.8 V

## 2011-06-04 NOTE — Assessment & Plan Note (Signed)
Her blood pressure is slightly elevated. I have asked the patient to reduce her salt intake in addition to weight loss and continued medical therapy.

## 2011-06-04 NOTE — Assessment & Plan Note (Signed)
She denies anginal symptoms. I have encouraged the patient to increase her physical activity as she continues to gain weight.

## 2011-06-04 NOTE — Patient Instructions (Signed)
Your physician wants you to follow-up in: 3 months with Gunnar Fusi for a device check. You will receive a reminder letter in the mail two months in advance. If you don't receive a letter, please call our office to schedule the follow-up appointment.  Your physician wants you to follow-up in: 1 year with Dr. Ladona Ridgel. You will receive a reminder letter in the mail two months in advance. If you don't receive a letter, please call our office to schedule the follow-up appointment.

## 2011-06-04 NOTE — Progress Notes (Signed)
HPI [name] [name] returns today for followup after a long absence from our arrhythmia clinic. She is a very pleasant 67 year old woman with an ischemic cardiomyopathy, status post myocardial infarction, with chronic systolic heart failure. She has had no ICD shocks. She denies syncope, chest pain, or peripheral edema.  No Known Allergies   Current Outpatient Prescriptions  Medication Sig Dispense Refill  . ALPRAZolam (XANAX) 1 MG tablet Take 1 mg by mouth at bedtime as needed.        Marland Kitchen aspirin 81 MG tablet Take 81 mg by mouth daily.        . carvedilol (COREG) 25 MG tablet Take 1 tablet (25 mg total) by mouth 2 (two) times daily with a meal.  30 tablet  3  . enalapril (VASOTEC) 10 MG tablet Take 1 tablet (10 mg total) by mouth 2 (two) times daily.  60 tablet  6  . fenofibrate 160 MG tablet Take 160 mg by mouth at bedtime.        . furosemide (LASIX) 40 MG tablet Take 40 mg by mouth daily.      . insulin aspart (NOVOLOG) 100 UNIT/ML injection Inject into the skin 3 (three) times daily before meals. Sliding Scale      . insulin glargine (LANTUS) 100 UNIT/ML injection Inject 70 Units into the skin at bedtime.       Marland Kitchen levothyroxine (SYNTHROID, LEVOTHROID) 100 MCG tablet Take 100 mcg by mouth daily.        . Liraglutide (VICTOZA Thayer) Inject into the skin as directed.      . multivitamin (THERAGRAN) per tablet Take 1 tablet by mouth daily.        . Omega-3-acid Ethyl Esters (LOVAZA PO) Take by mouth daily.        . potassium chloride SA (K-DUR,KLOR-CON) 20 MEQ tablet Take 1 tablet (20 mEq total) by mouth daily.  30 tablet  6  . simvastatin (ZOCOR) 80 MG tablet TAKE ONE TABLET DAILY AT BEDTIME  30 tablet  1  . vitamin E 600 UNIT capsule Take 600 Units by mouth daily.           Past Medical History  Diagnosis Date  . Chronic systolic heart failure   . Diabetes mellitus, type 2   . Hyperlipidemia   . Essential hypertension, benign   . Hypothyroidism   . PVD (peripheral vascular disease)   .  Coronary atherosclerosis of native coronary artery     Multivessel, patent grafts 2007  . Ischemic cardiomyopathy     Ejection Fraction of 25% improved to 45-50% following biventicular pacing, Status post biventricular dual-chamber ICD - [employer]  . Hypothyroidism     ROS:   All systems reviewed and negative except as noted in the HPI.   Past Surgical History  Procedure Date  . Coronary artery bypass graft 2001    [facility] - LIMA to LAD, SVG to ramus, SVG to OM/PLB, SVG to RCA   . Appendectomy   . Bilateral tubal ligation   . Cardiac defibrillator placement     [facility]     Family History  Problem Relation Age of Onset  . Diabetes Mother   . Diabetes Sister   . Diabetes Father   . Heart attack Father   . Coronary artery disease Sister 90     History   Social History  . Marital Status: Divorced    Spouse Name: N/A    Number of Children: N/A  .  Years of Education: N/A   Occupational History  . Full Time: [employer]   . Disabled    Social History Main Topics  . Smoking status: Never Smoker   . Smokeless tobacco: Never Used  . Alcohol Use: No  . Drug Use: No  . Sexually Active: Not on file   Other Topics Concern  . Not on file   Social History Narrative  . No narrative on file     BP 143/67  Pulse 73  Resp 18  Ht 5\' 7"  (1.702 m)  Wt 115.667 kg (255 lb)  BMI 39.94 kg/m2  Physical Exam:  Well appearing obese, middle-age woman, NAD HEENT: Unremarkable Neck:  No JVD, no thyromegally Lungs:  Clear with no wheezes, rales, or rhonchi. HEART:  Regular rate rhythm, no murmurs, no rubs, no clicks Abd:  Obese, soft, positive bowel sounds, no organomegally, no rebound, no guarding Ext:  2 plus pulses, no edema, no cyanosis, no clubbing Skin:  No rashes no nodules Neuro:  CN II through XII intact, motor grossly intact  DEVICE  Normal device function.  See PaceArt for details.   Assess/Plan:

## 2011-06-04 NOTE — Assessment & Plan Note (Signed)
Her device is working normally. She has had no therapy. We'll plan to recheck in several months.

## 2011-06-10 ENCOUNTER — Other Ambulatory Visit: Payer: Self-pay | Admitting: Family Medicine

## 2011-06-10 DIAGNOSIS — Z139 Encounter for screening, unspecified: Secondary | ICD-10-CM

## 2011-06-10 DIAGNOSIS — Z0000 Encounter for general adult medical examination without abnormal findings: Secondary | ICD-10-CM | POA: Diagnosis not present

## 2011-06-16 ENCOUNTER — Ambulatory Visit (HOSPITAL_COMMUNITY)
Admission: RE | Admit: 2011-06-16 | Discharge: 2011-06-16 | Disposition: A | Discharge status: Home / Self Care | Payer: Medicare Other | Source: Ambulatory Visit | Attending: Family Medicine | Admitting: Family Medicine

## 2011-06-16 DIAGNOSIS — Z1231 Encounter for screening mammogram for malignant neoplasm of breast: Secondary | ICD-10-CM | POA: Diagnosis not present

## 2011-06-16 DIAGNOSIS — Z139 Encounter for screening, unspecified: Secondary | ICD-10-CM

## 2011-07-09 ENCOUNTER — Other Ambulatory Visit: Payer: Self-pay | Admitting: Endocrinology

## 2011-08-04 ENCOUNTER — Telehealth: Payer: Self-pay

## 2011-08-05 ENCOUNTER — Other Ambulatory Visit: Payer: Self-pay

## 2011-08-05 DIAGNOSIS — Z139 Encounter for screening, unspecified: Secondary | ICD-10-CM

## 2011-08-05 NOTE — Telephone Encounter (Signed)
PT NEEDS OPV PRIOR TO TCS. HAS MULTIPLE MEDICAL PROBLEMS. GET APPT ASAP.

## 2011-08-05 NOTE — Telephone Encounter (Signed)
Gastroenterology Pre-Procedure Form  Pt was referred by Dr. Brett Canales Luking/ She called/ no paper referral  PT HAS A PACEMAKER AND A DEFIBRILATOR   Request Date: 08/04/2011     Requesting Physician: Eber Jones Hoskins/ Dr. Lubertha South     PATIENT INFORMATION:  [name] [name] is a 66 y.o., female (DOB=1945/10/17).  PROCEDURE: Procedure(s) requested: colonoscopy Procedure Reason: screening for colon cancer  PATIENT REVIEW QUESTIONS: The patient reports the following:   1. Diabetes Melitis: yes  2. Joint replacements in the past 12 months: no 3. Major health problems in the past 3 months: no 4. Has an artificial valve or MVP:no 5. Has been advised in past to take antibiotics in advance of a procedure like teeth cleaning: no}    MEDICATIONS & ALLERGIES:    Patient reports the following regarding taking any blood thinners:   Plavix? no Aspirin?yes  Coumadin?  no  Patient confirms/reports the following medications:  Current Outpatient Prescriptions  Medication Sig Dispense Refill  . ALPRAZolam (XANAX) 1 MG tablet Take 1 mg by mouth at bedtime as needed. Pt said she only takes one or two weekly at bedtime      . aspirin 81 MG tablet Take 81 mg by mouth daily.        . carvedilol (COREG) 25 MG tablet Take 1 tablet (25 mg total) by mouth 2 (two) times daily with a meal.  30 tablet  3  . enalapril (VASOTEC) 10 MG tablet Take 1 tablet (10 mg total) by mouth 2 (two) times daily.  60 tablet  6  . fenofibrate 160 MG tablet Take 160 mg by mouth at bedtime.        . furosemide (LASIX) 40 MG tablet Take 40 mg by mouth daily.      . insulin aspart (NOVOLOG) 100 UNIT/ML injection Inject into the skin 3 (three) times daily before meals. Sliding Scale      . insulin glargine (LANTUS) 100 UNIT/ML injection Inject 70 Units into the skin 2 (two) times daily.       Marland Kitchen levothyroxine (SYNTHROID, LEVOTHROID) 100 MCG tablet Take 100 mcg by mouth daily.        . Liraglutide (VICTOZA Gratz) Inject into the skin as  directed.      . multivitamin (THERAGRAN) per tablet Take 1 tablet by mouth daily.        . Omega-3-acid Ethyl Esters (LOVAZA PO) Take by mouth daily.        . potassium chloride SA (K-DUR,KLOR-CON) 20 MEQ tablet Take 1 tablet (20 mEq total) by mouth daily.  30 tablet  6  . simvastatin (ZOCOR) 80 MG tablet TAKE ONE TABLET DAILY AT BEDTIME  30 tablet  0  . vitamin E 600 UNIT capsule Take 600 Units by mouth daily.          Patient confirms/reports the following allergies:  No Known Allergies  Patient is appropriate to schedule for requested procedure(s): yes  AUTHORIZATION INFORMATION Primary Insurance:   ID #:   Group #:  Pre-Cert / Auth required:  Pre-Cert / Auth #:   Secondary Insurance:   ID #:  Group #:  Pre-Cert / Auth required:  Pre-Cert / Auth #:   No orders of the defined types were placed in this encounter.    SCHEDULE INFORMATION: Procedure has been scheduled as follows:  Date: 09/09/2011    Time: 8:30 AM  Location: [facility] Short Stay  This Gastroenterology Pre-Precedure Form is being routed to the following  provider(s) for review: Jonette Eva, MD

## 2011-08-05 NOTE — Telephone Encounter (Signed)
Pt is scheduled for an OV on 08/10/2011 at 9:30 AM with Tana Coast, PA.

## 2011-08-05 NOTE — Telephone Encounter (Signed)
LMOM to call.

## 2011-08-07 ENCOUNTER — Encounter: Payer: Self-pay | Admitting: Gastroenterology

## 2011-08-10 ENCOUNTER — Ambulatory Visit: Payer: Medicare Other | Admitting: Gastroenterology

## 2011-08-12 ENCOUNTER — Other Ambulatory Visit: Payer: Self-pay | Admitting: Internal Medicine

## 2011-08-12 ENCOUNTER — Other Ambulatory Visit: Payer: Self-pay | Admitting: Endocrinology

## 2011-08-12 ENCOUNTER — Other Ambulatory Visit: Payer: Self-pay

## 2011-08-12 MED ORDER — CARVEDILOL 25 MG PO TABS: 25.0000 mg | ORAL_TABLET | Freq: Two times a day (BID) | ORAL | Status: DC

## 2011-08-13 ENCOUNTER — Other Ambulatory Visit: Payer: Self-pay

## 2011-08-13 ENCOUNTER — Telehealth: Payer: Self-pay | Admitting: Gastroenterology

## 2011-08-13 ENCOUNTER — Ambulatory Visit: Payer: Medicare Other | Admitting: Gastroenterology

## 2011-08-13 MED ORDER — ENALAPRIL MALEATE 10 MG PO TABS: 10.0000 mg | ORAL_TABLET | Freq: Two times a day (BID) | ORAL | Status: DC

## 2011-08-13 NOTE — Telephone Encounter (Signed)
Pt was a no show

## 2011-08-13 NOTE — Telephone Encounter (Signed)
Please offer reschedule. 

## 2011-08-13 NOTE — Telephone Encounter (Signed)
..   Requested Prescriptions   Pending Prescriptions Disp Refills  . enalapril (VASOTEC) 10 MG tablet 60 tablet 10    Sig: Take 1 tablet (10 mg total) by mouth 2 (two) times daily.

## 2011-08-14 MED ORDER — CARVEDILOL 25 MG PO TABS: 25.0000 mg | ORAL_TABLET | Freq: Two times a day (BID) | ORAL | Status: DC

## 2011-08-14 NOTE — Telephone Encounter (Signed)
Pt saw dr Ladona Ridgel in [place] in feb Please resend refill for coreg

## 2011-08-19 ENCOUNTER — Encounter: Payer: Self-pay | Admitting: Gastroenterology

## 2011-08-19 ENCOUNTER — Other Ambulatory Visit: Payer: Self-pay | Admitting: *Deleted

## 2011-08-19 MED ORDER — FUROSEMIDE 40 MG PO TABS: 40.0000 mg | ORAL_TABLET | Freq: Every day | ORAL | Status: DC

## 2011-08-19 NOTE — Telephone Encounter (Signed)
Mailed letter to patient to call to set up OV °

## 2011-08-24 ENCOUNTER — Telehealth: Payer: Self-pay | Admitting: Internal Medicine

## 2011-08-24 NOTE — Telephone Encounter (Signed)
New Problem:     Pharmacy called wondering if her dose of furosemide (LASIX) 40 MG tablet was changed. Please call back.

## 2011-08-25 NOTE — Telephone Encounter (Signed)
Called and spoke to pharmacist and the patient has been getting the lasix filled for 40 mg bid, but this script was sent in for once a day.  I confirmed that this dose was changed per note in patients chart in Feb. 2013 by Dr. Ladona Ridgel.  Pharmacist aware.  Vista Mink, CMA

## 2011-09-08 ENCOUNTER — Telehealth: Payer: Self-pay

## 2011-09-08 DIAGNOSIS — E559 Vitamin D deficiency, unspecified: Secondary | ICD-10-CM | POA: Diagnosis not present

## 2011-09-08 DIAGNOSIS — E785 Hyperlipidemia, unspecified: Secondary | ICD-10-CM | POA: Diagnosis not present

## 2011-09-08 DIAGNOSIS — IMO0001 Reserved for inherently not codable concepts without codable children: Secondary | ICD-10-CM | POA: Diagnosis not present

## 2011-09-08 DIAGNOSIS — E890 Postprocedural hypothyroidism: Secondary | ICD-10-CM | POA: Diagnosis not present

## 2011-09-08 DIAGNOSIS — E669 Obesity, unspecified: Secondary | ICD-10-CM | POA: Diagnosis not present

## 2011-09-08 NOTE — Telephone Encounter (Signed)
Pt called this morning want to cancel her TCS for 23-Sep-2011. She had a death in the family. I called Kim to cancel her TCS and she has an office visit on June 5 at 2:00, to get rescheduled for her TCS.

## 2011-09-08 NOTE — Telephone Encounter (Signed)
When pt was first triaged, the appt for colonoscopy was scheduled for 09/09/2011. Dr. Darrick Penna said pt muct be seen first so the appt was cancelled. Pt cancelled OV appt on 08/10/2011 and no showed on 08/13/2011.

## 2011-09-09 ENCOUNTER — Ambulatory Visit: Admit: 2011-09-09 | Payer: Self-pay | Admitting: Gastroenterology

## 2011-09-09 SURGERY — COLONOSCOPY
Anesthesia: Moderate Sedation

## 2011-09-14 ENCOUNTER — Emergency Department (HOSPITAL_COMMUNITY)
Admission: EM | Admit: 2011-09-14 | Discharge: 2011-09-14 | Disposition: A | Discharge status: Home / Self Care | Payer: Medicare Other | Attending: Emergency Medicine | Admitting: Emergency Medicine

## 2011-09-14 ENCOUNTER — Encounter (HOSPITAL_COMMUNITY): Payer: Self-pay | Admitting: *Deleted

## 2011-09-14 ENCOUNTER — Emergency Department (HOSPITAL_COMMUNITY): Payer: Medicare Other

## 2011-09-14 DIAGNOSIS — R002 Palpitations: Secondary | ICD-10-CM | POA: Insufficient documentation

## 2011-09-14 DIAGNOSIS — R42 Dizziness and giddiness: Secondary | ICD-10-CM | POA: Insufficient documentation

## 2011-09-14 DIAGNOSIS — I10 Essential (primary) hypertension: Secondary | ICD-10-CM | POA: Insufficient documentation

## 2011-09-14 DIAGNOSIS — R5381 Other malaise: Secondary | ICD-10-CM | POA: Diagnosis not present

## 2011-09-14 DIAGNOSIS — Z95810 Presence of automatic (implantable) cardiac defibrillator: Secondary | ICD-10-CM | POA: Diagnosis not present

## 2011-09-14 DIAGNOSIS — Z951 Presence of aortocoronary bypass graft: Secondary | ICD-10-CM | POA: Diagnosis not present

## 2011-09-14 DIAGNOSIS — R51 Headache: Secondary | ICD-10-CM | POA: Diagnosis not present

## 2011-09-14 DIAGNOSIS — I959 Hypotension, unspecified: Secondary | ICD-10-CM | POA: Diagnosis not present

## 2011-09-14 DIAGNOSIS — Z794 Long term (current) use of insulin: Secondary | ICD-10-CM | POA: Diagnosis not present

## 2011-09-14 DIAGNOSIS — E119 Type 2 diabetes mellitus without complications: Secondary | ICD-10-CM | POA: Diagnosis not present

## 2011-09-14 DIAGNOSIS — I509 Heart failure, unspecified: Secondary | ICD-10-CM | POA: Diagnosis not present

## 2011-09-14 DIAGNOSIS — R5383 Other fatigue: Secondary | ICD-10-CM | POA: Insufficient documentation

## 2011-09-14 LAB — DIFFERENTIAL
Basophils Absolute: 0.1 10*3/uL (ref 0.0–0.1)
Eosinophils Absolute: 0.3 10*3/uL (ref 0.0–0.7)
Eosinophils Relative: 5 % (ref 0–5)
Lymphocytes Relative: 31 % (ref 12–46)
Lymphs Abs: 2.3 10*3/uL (ref 0.7–4.0)
Monocytes Absolute: 0.7 10*3/uL (ref 0.1–1.0)

## 2011-09-14 LAB — CBC
HCT: 39.4 % (ref 36.0–46.0)
MCH: 30.3 pg (ref 26.0–34.0)
MCHC: 33 g/dL (ref 30.0–36.0)
MCV: 91.8 fL (ref 78.0–100.0)
Platelets: 310 10*3/uL (ref 150–400)
RDW: 13.2 % (ref 11.5–15.5)
WBC: 7.3 10*3/uL (ref 4.0–10.5)

## 2011-09-14 LAB — COMPREHENSIVE METABOLIC PANEL
ALT: 25 U/L (ref 0–35)
BUN: 16 mg/dL (ref 6–23)
CO2: 28 mEq/L (ref 19–32)
Calcium: 10.5 mg/dL (ref 8.4–10.5)
Creatinine, Ser: 0.71 mg/dL (ref 0.50–1.10)
GFR calc Af Amer: >90 mL/min (ref >90)
GFR calc non Af Amer: 89 mL/min — ABNORMAL LOW (ref >90)
Glucose, Bld: 152 mg/dL — ABNORMAL HIGH (ref 70–99)
Sodium: 141 mEq/L (ref 135–145)
Total Bilirubin: 0.4 mg/dL (ref 0.3–1.2)
Total Protein: 7.7 g/dL (ref 6.0–8.3)

## 2011-09-14 LAB — TROPONIN I: Troponin I: <0.3 ng/mL (ref <0.30)

## 2011-09-14 NOTE — ED Notes (Signed)
Pt in xray

## 2011-09-14 NOTE — ED Provider Notes (Addendum)
History   This chart was scribed for [name] Lennert, MD by Shari Heritage. The patient was seen in room APA12/APA12. Patient's care was started at 1456.     CSN: [id number]  Arrival date & time 09/14/11  1456   First MD Initiated Contact with Patient 09/14/11 1539      Chief Complaint  Patient presents with  . Hypotension    (Consider location/radiation/quality/duration/timing/severity/associated sxs/prior treatment) Patient is a 66 y.o. female presenting with weakness. The history is provided by the patient. No language interpreter was used.  Weakness The primary symptoms include headaches and dizziness. Primary symptoms do not include seizures. Primary symptoms comment: Hypotension and blood pressure fluctuation The symptoms began 6 to 12 hours ago. The symptoms are improving.  The headache began today. The headache developed suddenly. Headache is a new problem. The headache is present rarely. Associated with: hypotension, blood pressure fluctuation. The headache is associated with weakness.  The dizziness began today. The dizziness has been resolved since its onset. Dizziness also occurs with weakness.  Additional symptoms include weakness. Additional symptoms do not include hallucinations.   [name] [name] is a 66 y.o. female who presents to the Emergency Department complaining of sudden, hypotension onset this morning associated with HA and dizziness. Patient says that she felt her heart "flutter" earlier this morning with onset of symptoms. Patient reports she usually checks her blood pressure at least once a week. Patient has a pacemaker and defibrillator. Patient denies SOB or chest pain. Patient with h/o diabetes mellitus, hyperlipidemia, HTN, hypothyroidism, atherosclerosis, ischemic cardiomyopathy. Patient with h/o of CABG x5 and bilateral tubal ligation.  Patient also complains of tenderness in her chest.  Past Medical History  Diagnosis Date  . Chronic systolic heart  failure   . Diabetes mellitus, type 2   . Hyperlipidemia   . Essential hypertension, benign   . Hypothyroidism   . PVD (peripheral vascular disease)   . Coronary atherosclerosis of native coronary artery     Multivessel, patent grafts 2007  . Ischemic cardiomyopathy     Ejection Fraction of 25% improved to 45-50% following biventicular pacing, Status post biventricular dual-chamber ICD - [employer]  . Hypothyroidism   . Pacemaker   . Cardiac defibrillator in place     Past Surgical History  Procedure Date  . Coronary artery bypass graft 2001    [facility] - LIMA to LAD, SVG to ramus, SVG to OM/PLB, SVG to RCA   . Appendectomy   . Bilateral tubal ligation   . Cardiac defibrillator placement     [facility]  . Flexible sigmoidoscopy 07/19/1998    Dr. Karilyn Cota  . Tonsillectomy     Family History  Problem Relation Age of Onset  . Diabetes Mother   . Diabetes Sister   . Diabetes Father   . Heart attack Father   . Coronary artery disease Sister 59    History  Substance Use Topics  . Smoking status: Never Smoker   . Smokeless tobacco: Never Used  . Alcohol Use: No    OB History    Grav Para Term Preterm Abortions TAB SAB Ect Mult Living                  Review of Systems  Constitutional: Negative for fatigue.  HENT: Negative for congestion, sinus pressure and ear discharge.   Eyes: Negative for discharge.  Respiratory: Negative for cough.   Cardiovascular: Negative for chest pain.  Gastrointestinal: Negative for abdominal  pain and diarrhea.  Genitourinary: Negative for frequency and hematuria.  Musculoskeletal: Negative for back pain.  Skin: Negative for rash.  Neurological: Positive for dizziness, weakness and headaches. Negative for seizures.  Hematological: Negative.   Psychiatric/Behavioral: Negative for hallucinations.    Allergies  Review of patient's allergies indicates no known allergies.  Home Medications   Current  Outpatient Rx  Name Route Sig Dispense Refill  . ALPRAZOLAM 1 MG PO TABS Oral Take 1 mg by mouth at bedtime as needed. Pt said she only takes one or two weekly at bedtime    . ASPIRIN 81 MG PO TABS Oral Take 81 mg by mouth daily.      Marland Kitchen CARVEDILOL 25 MG PO TABS Oral Take 1 tablet (25 mg total) by mouth 2 (two) times daily with a meal. 60 tablet 10    Patient needs to make appointment  . ENALAPRIL MALEATE 10 MG PO TABS Oral Take 1 tablet (10 mg total) by mouth 2 (two) times daily. 60 tablet 10  . FENOFIBRATE 160 MG PO TABS Oral Take 160 mg by mouth at bedtime.      . FUROSEMIDE 40 MG PO TABS Oral Take 1 tablet (40 mg total) by mouth daily. 30 tablet 5  . INSULIN ASPART 100 UNIT/ML [place] SOLN Subcutaneous Inject into the skin 3 (three) times daily before meals. Sliding Scale    . INSULIN GLARGINE 100 UNIT/ML [place] SOLN Subcutaneous Inject 70 Units into the skin 2 (two) times daily.     Marland Kitchen LEVOTHYROXINE SODIUM 100 MCG PO TABS Oral Take 100 mcg by mouth daily.      Marland Kitchen VICTOZA [place] Subcutaneous Inject into the skin as directed.    . MULTIVITAMINS PO TABS Oral Take 1 tablet by mouth daily.      Marland Kitchen LOVAZA PO Oral Take by mouth daily.      Marland Kitchen POTASSIUM CHLORIDE CRYS ER 20 MEQ PO TBCR Oral Take 1 tablet (20 mEq total) by mouth daily. 30 tablet 6  . SIMVASTATIN 80 MG PO TABS  TAKE ONE TABLET DAILY AT BEDTIME 30 tablet 0    No additional refills. Pt needs an appointment wit ...  . VITAMIN E 600 UNITS PO CAPS Oral Take 600 Units by mouth daily.        BP 151/65  Pulse 82  Temp(Src) 98.1 F (36.7 C) (Oral)  Resp 20  Ht 5\' 7"  (1.702 m)  Wt 233 lb (105.688 kg)  BMI 36.49 kg/m2  SpO2 96%  Physical Exam  Constitutional: She is oriented to person, place, and time. She appears well-developed.  HENT:  Head: Normocephalic and atraumatic.  Eyes: Conjunctivae and EOM are normal. No scleral icterus.  Neck: Neck supple. No thyromegaly present.  Cardiovascular: Regular rhythm.  Exam reveals no gallop and no friction  rub.   No murmur heard.      Irregular heart beat  Pulmonary/Chest: No stridor. She has no wheezes. She has no rales. She exhibits tenderness (Left chest).  Abdominal: She exhibits no distension. There is no tenderness. There is no rebound.  Musculoskeletal: Normal range of motion. She exhibits no edema.       Dorsalis pedis pulse is +1  Lymphadenopathy:    She has no cervical adenopathy.  Neurological: She is oriented to person, place, and time. Coordination normal.  Skin: No rash noted. No erythema.       Healed incision to chest  Psychiatric: She has a normal mood and affect. Her behavior is normal.  ED Course  Procedures (including critical care time) DIAGNOSTIC STUDIES: Oxygen Saturation is 96% on room air, adequate by my interpretation.    COORDINATION OF CARE: 3:47PM - Patient informed of current plan for treatment and evaluation and agrees with plan at this time.     Labs Reviewed  COMPREHENSIVE METABOLIC PANEL - Abnormal; Notable for the following:    Glucose, Bld 152 (*)    GFR calc non Af Amer 89 (*)    All other components within normal limits  CBC  DIFFERENTIAL  TROPONIN I   Dg Chest 2 View  09/14/2011  *RADIOLOGY REPORT*  Clinical Data: Low blood pressure  CHEST - 2 VIEW  Comparison: 05/17/2007  Findings: The heart is mildly enlarged.  Mild central basilar interstitial edema.  Left subclavian AICD device and leads are stable.  No pneumothorax.  Chronic rib deformities.  IMPRESSION: Mild CHF.  Original Report Authenticated By: Donavan Burnet, M.D.     No diagnosis found.    MDM      Date: 10/22/2011  Rate: 93  Rhythm: paced  QRS Axis: normal  Intervals: normal  ST/T Wave abnormalities: nonspecific ST changes  Conduction Disutrbances:left bundle branch block  Narrative Interpretation:   Old EKG Reviewed: none available      The chart was scribed for me under my direct supervision.  I personally performed the history, physical, and medical  decision making and all procedures in the evaluation of this patient.[name] Lennert, MD 09/14/11 1719  [name] Lennert, MD 10/22/11 [phone]

## 2011-09-14 NOTE — ED Notes (Signed)
Bp was low at home, and felt fluttering in chest today, Has a defibrillator and pacemakeer.   Had dizziness earlier,

## 2011-09-14 NOTE — Discharge Instructions (Signed)
Follow up with your md this week. °

## 2011-09-15 ENCOUNTER — Other Ambulatory Visit (HOSPITAL_COMMUNITY): Payer: Self-pay | Admitting: "Endocrinology

## 2011-09-15 DIAGNOSIS — E049 Nontoxic goiter, unspecified: Secondary | ICD-10-CM

## 2011-09-15 DIAGNOSIS — I10 Essential (primary) hypertension: Secondary | ICD-10-CM | POA: Diagnosis not present

## 2011-09-15 DIAGNOSIS — E039 Hypothyroidism, unspecified: Secondary | ICD-10-CM | POA: Diagnosis not present

## 2011-09-15 DIAGNOSIS — E785 Hyperlipidemia, unspecified: Secondary | ICD-10-CM | POA: Diagnosis not present

## 2011-09-16 ENCOUNTER — Other Ambulatory Visit: Payer: Self-pay | Admitting: Gastroenterology

## 2011-09-16 ENCOUNTER — Ambulatory Visit (INDEPENDENT_AMBULATORY_CARE_PROVIDER_SITE_OTHER): Payer: Medicare Other | Admitting: Urgent Care

## 2011-09-16 ENCOUNTER — Encounter: Payer: Self-pay | Admitting: Urgent Care

## 2011-09-16 ENCOUNTER — Ambulatory Visit (HOSPITAL_COMMUNITY)
Admission: RE | Admit: 2011-09-16 | Discharge: 2011-09-16 | Disposition: A | Discharge status: Home / Self Care | Payer: Medicare Other | Source: Ambulatory Visit | Attending: "Endocrinology | Admitting: "Endocrinology

## 2011-09-16 DIAGNOSIS — E049 Nontoxic goiter, unspecified: Secondary | ICD-10-CM | POA: Diagnosis not present

## 2011-09-16 DIAGNOSIS — Z1211 Encounter for screening for malignant neoplasm of colon: Secondary | ICD-10-CM

## 2011-09-16 DIAGNOSIS — E039 Hypothyroidism, unspecified: Secondary | ICD-10-CM | POA: Diagnosis not present

## 2011-09-16 DIAGNOSIS — Z95810 Presence of automatic (implantable) cardiac defibrillator: Secondary | ICD-10-CM

## 2011-09-16 DIAGNOSIS — E119 Type 2 diabetes mellitus without complications: Secondary | ICD-10-CM | POA: Diagnosis not present

## 2011-09-16 MED ORDER — PEG 3350-KCL-NA BICARB-NACL 420 G PO SOLR: ORAL | Status: AC

## 2011-09-16 NOTE — Assessment & Plan Note (Signed)
Hold your slide scale insulin the night before and the morning of your procedure Take Lantus (half your usual dose) 35 units the night before your procedure. Hold your Lantus the morning of the procedure. Follow blood sugars, call us or your PCP if any problems.

## 2011-09-16 NOTE — Patient Instructions (Addendum)
You'll need colonoscopy with Dr. Darrick Penna Hold your slide scale insulin the night before and the morning of your procedure Take Lantus (half your usual dose) 35 units the night before your procedure. Hold your Lantus the morning of the procedure. Follow blood sugars, call us or your PCP if any problems. Be sure to talk with your cardiologist prior to your procedure since she's been having episodes of PVCs

## 2011-09-16 NOTE — Progress Notes (Signed)
Primary Care Physician:  Harlow Asa, MD, MD Primary Gastroenterologist:  Dr. Darrick Penna  Chief Complaint  Patient presents with  . Colonoscopy    Defibrillator, diabetes mellitus, multiple medications    HPI:  [name] [name] is a 66 y.o. female here as a new patient for screening colonoscopy. She denies any complaints at this time. She does have lifelong history of intermittent constipation. Denies any rectal bleeding or melena. She was triaged for colonoscopy and asked to come in for an office visit given her multiple medications, chronic medical problems, defibrillator, and diabetes. She has been to the emergency department recently and tells me she has had frequent PVCs and palpitations. She is going to talk to her cardiologist tomorrow about this. Denies any upper GI symptoms including heartburn, indigestion, nausea, vomiting, dysphagia, odynophagia or anorexia.  Past Medical History  Diagnosis Date  . Chronic systolic heart failure     Dr Diona Browner  . Diabetes mellitus, type 2   . Hyperlipidemia   . Essential hypertension, benign     Pt denies  . Hypothyroidism   . PVD (peripheral vascular disease)   . Coronary atherosclerosis of native coronary artery     Multivessel, patent grafts 2007  . Ischemic cardiomyopathy     Ejection Fraction of 25% improved to 45-50% following biventicular pacing, Status post biventricular dual-chamber ICD - [employer]  . Hypothyroidism   . Pacemaker   . Cardiac defibrillator in place   . Constipation   . Colon polyps     20 yrs ago    Past Surgical History  Procedure Date  . Coronary artery bypass graft 2001    [facility] - LIMA to LAD, SVG to ramus, SVG to OM/PLB, SVG to RCA   . Appendectomy   . Bilateral tubal ligation   . Cardiac defibrillator placement     [facility]  . Flexible sigmoidoscopy 07/19/1998    Dr. Karilyn Cota  . Tonsillectomy   . Cesarean section     Current Outpatient Prescriptions  Medication Sig  Dispense Refill  . ALPRAZolam (XANAX) 1 MG tablet Take 1 mg by mouth at bedtime as needed. Pt said she only takes one or two weekly at bedtime      . aspirin 81 MG tablet Take 81 mg by mouth daily.        . carvedilol (COREG) 25 MG tablet Take 1 tablet (25 mg total) by mouth 2 (two) times daily with a meal.  60 tablet  10  . enalapril (VASOTEC) 10 MG tablet Take 1 tablet (10 mg total) by mouth 2 (two) times daily.  60 tablet  10  . fenofibrate 160 MG tablet Take 160 mg by mouth at bedtime.        . furosemide (LASIX) 40 MG tablet Take 1 tablet (40 mg total) by mouth daily.  30 tablet  5  . insulin aspart (NOVOLOG) 100 UNIT/ML injection Inject into the skin 3 (three) times daily before meals. Sliding Scale      . insulin glargine (LANTUS) 100 UNIT/ML injection Inject 70 Units into the skin 2 (two) times daily.       Marland Kitchen levothyroxine (SYNTHROID, LEVOTHROID) 100 MCG tablet Take 100 mcg by mouth daily.        . Liraglutide (VICTOZA [place]) Inject into the skin as directed.      . multivitamin (THERAGRAN) per tablet Take 1 tablet by mouth daily.        . Omega-3-acid Ethyl Esters (LOVAZA PO)  Take by mouth daily.        . potassium chloride SA (K-DUR,KLOR-CON) 20 MEQ tablet Take 1 tablet (20 mEq total) by mouth daily.  30 tablet  6  . simvastatin (ZOCOR) 80 MG tablet TAKE ONE TABLET DAILY AT BEDTIME  30 tablet  0  . vitamin E 600 UNIT capsule Take 600 Units by mouth daily.          Allergies as of 09/16/2011  . (No Known Allergies)    Family History:There is no known family history of colorectal carcinoma , liver disease, or inflammatory bowel disease.  Problem Relation Age of Onset  . Diabetes Mother   . Diabetes Sister   . Diabetes Father   . Heart attack Father   . Coronary artery disease Sister 30  . Ulcers Mother     History   Social History  . Marital Status: Divorced    Spouse Name: N/A    Number of Children: 1  . Years of Education: N/A   Occupational History  . Full Time: [employer]   . Disabled   .     Social History Main Topics  . Smoking status: Former Smoker    Quit date: 09/16/1991  . Smokeless tobacco: Never Used  . Alcohol Use: No  . Drug Use: No  . Sexually Active: Not on file  Review of Systems: Gen: Denies any fever, chills, sweats, anorexia, fatigue, weakness, malaise, weight loss, and sleep disorder CV: See history of present illness Resp: Denies dyspnea at rest, dyspnea with exercise, cough, sputum, wheezing, coughing up blood, and pleurisy. GI: Denies vomiting blood, jaundice, and fecal incontinence.  GU : Denies urinary burning, blood in urine, urinary frequency, urinary hesitancy, nocturnal urination, and urinary incontinence. MS: Denies joint pain, limitation of movement, and swelling, stiffness, low back pain, extremity pain. Denies muscle weakness, cramps, atrophy.  Derm: Denies rash, itching, dry skin, hives, moles, warts, or unhealing ulcers.  Psych: Denies depression, anxiety, memory loss, suicidal ideation, hallucinations, paranoia, and confusion. Heme: Denies bruising, bleeding, and enlarged lymph nodes. Neuro:  Denies any headaches, dizziness, paresthesias. Endo:  Denies any problems with DM, thyroid, adrenal function.  Physical Exam: BP 108/50  Pulse 69  Temp(Src) 98.9 F (37.2 C) (Temporal)  Ht 5\' 7"  (1.702 m)  Wt 253 lb (114.76 kg)  BMI 39.63 kg/m2 General:   Alert,  Well-developed, obese, pleasant and cooperative in NAD Head:  Normocephalic and atraumatic. Eyes:  Sclera clear, no icterus.   Conjunctiva pink. Ears:  Normal auditory acuity. Nose:  No deformity, discharge, or lesions. Mouth:  No deformity or lesions,oropharynx pink & moist. Neck:  Supple; no masses or thyromegaly. Lungs:  Clear throughout to auscultation.   No wheezes, crackles, or rhonchi. No acute distress. Heart:  Defibrillator left upper anterior chest. Regular rate and rhythm; no murmurs, clicks, rubs,  or gallops. Abdomen:  Protuberant.  Normal bowel sounds.  No bruits.  Soft, non-tender and non-distended without masses, hepatosplenomegaly or hernias noted.  No guarding or rebound tenderness.  Exam limited given patient's body habitus. Rectal:  Deferred. Msk:  Symmetrical without gross deformities. Normal posture. Pulses:  Normal pulses noted. Extremities:  Trace lower pretibial edema bilaterally. Neurologic:  Alert and  oriented x4;  grossly normal neurologically. Skin:  Intact without significant lesions or rashes. Lymph Nodes:  No significant cervical adenopathy. Psych:  Alert and cooperative. Normal mood and affect.

## 2011-09-16 NOTE — Assessment & Plan Note (Addendum)
[name] [name] is a pleasant 66 y.o. female who has multiple medical conditions including morbid obesity, type 2 diabetes mellitus, coronary artery disease, heart failure, ischemic cardiomyopathy, with defibrillator. She has had recent PVCs and is asked to followup with her cardiologist  prior to colonoscopy for clearance.  I have discussed risks & benefits which include, but are not limited to, bleeding, infection, perforation & drug reaction.  The patient agrees with this plan & written consent will be obtained.    Endo to be made aware of patient's defibrillator

## 2011-09-17 ENCOUNTER — Ambulatory Visit (INDEPENDENT_AMBULATORY_CARE_PROVIDER_SITE_OTHER): Payer: Medicare Other | Admitting: Adult Health

## 2011-09-17 ENCOUNTER — Encounter: Payer: Self-pay | Admitting: Adult Health

## 2011-09-17 VITALS — BP 132/64 | HR 70 | Resp 18 | Ht 67.0 in | Wt 253.0 lb

## 2011-09-17 DIAGNOSIS — I2510 Atherosclerotic heart disease of native coronary artery without angina pectoris: Secondary | ICD-10-CM | POA: Diagnosis not present

## 2011-09-17 DIAGNOSIS — Z95810 Presence of automatic (implantable) cardiac defibrillator: Secondary | ICD-10-CM | POA: Diagnosis not present

## 2011-09-17 DIAGNOSIS — I10 Essential (primary) hypertension: Secondary | ICD-10-CM | POA: Diagnosis not present

## 2011-09-17 NOTE — Progress Notes (Signed)
Faxed to PCP

## 2011-09-17 NOTE — Assessment & Plan Note (Signed)
She is stable at present. No plans to proceed with diagnostic testing unless she become more symptomatic with specific cardiac complaints. Continue medications as prescribed.

## 2011-09-17 NOTE — Assessment & Plan Note (Signed)
She is to follow-up with the device clinic when scheduled for annual interrogation of ICD.

## 2011-09-17 NOTE — Assessment & Plan Note (Signed)
Excellent BP control. 

## 2011-09-17 NOTE — Patient Instructions (Signed)
Your physician recommends that you schedule a follow-up appointment in: 6 MONTHS/  DR Ladona Ridgel IN [place]

## 2011-09-17 NOTE — Progress Notes (Signed)
HPI:  [name] [name] is a 66 y/o patient of Dr. Diona Browner and Dr. Ladona Ridgel we are following for ongoing assessment and treatment of  Ischemic cardiomyopathy, s/p MI, chronic systolic CHF, s/p ICD BiV pacemaker,-[employer] [place], hypertension. She was recently seen at [facility] for complaints of palpitations and weakness. She was ruled out for cardiac etiology. She did not have any ICD checks. All labs were normal. She was diagnosed with mild anxiety. She states that she has been under a lot of stress with financial and personal issues. She felt that it all caught up with her. She is medically compliant.   No Known Allergies  Current Outpatient Prescriptions  Medication Sig Dispense Refill  . ALPRAZolam (XANAX) 1 MG tablet Take 1 mg by mouth at bedtime as needed. Pt said she only takes one or two weekly at bedtime      . aspirin 81 MG tablet Take 81 mg by mouth daily.        . carvedilol (COREG) 25 MG tablet Take 1 tablet (25 mg total) by mouth 2 (two) times daily with a meal.  60 tablet  10  . enalapril (VASOTEC) 10 MG tablet Take 1 tablet (10 mg total) by mouth 2 (two) times daily.  60 tablet  10  . fenofibrate 160 MG tablet Take 160 mg by mouth at bedtime.        . furosemide (LASIX) 40 MG tablet Take 40 mg by mouth 2 (two) times daily.      . insulin aspart (NOVOLOG) 100 UNIT/ML injection Inject into the skin 3 (three) times daily before meals. Sliding Scale      . insulin glargine (LANTUS) 100 UNIT/ML injection Inject 70 Units into the skin 2 (two) times daily.       Marland Kitchen levothyroxine (SYNTHROID, LEVOTHROID) 100 MCG tablet Take 100 mcg by mouth daily.        . Liraglutide (VICTOZA [place]) Inject into the skin as directed.      . multivitamin (THERAGRAN) per tablet Take 1 tablet by mouth daily.        . Omega-3-acid Ethyl Esters (LOVAZA PO) Take by mouth daily.        . polyethylene glycol-electrolytes (TRILYTE) 420 G solution Use as directed Also buy 1 fleet enema & 4 dulcolax tablets to use as  directed  4000 mL  0  . potassium chloride SA (K-DUR,KLOR-CON) 20 MEQ tablet Take 1 tablet (20 mEq total) by mouth daily.  30 tablet  6  . simvastatin (ZOCOR) 80 MG tablet TAKE ONE TABLET DAILY AT BEDTIME  30 tablet  0  . vitamin E 600 UNIT capsule Take 600 Units by mouth daily.        Marland Kitchen DISCONTD: furosemide (LASIX) 40 MG tablet Take 1 tablet (40 mg total) by mouth daily.  30 tablet  5    Past Medical History  Diagnosis Date  . Chronic systolic heart failure     Dr Diona Browner  . Diabetes mellitus, type 2   . Hyperlipidemia   . Essential hypertension, benign     Pt denies  . Hypothyroidism   . PVD (peripheral vascular disease)   . Coronary atherosclerosis of native coronary artery     Multivessel, patent grafts 2007  . Ischemic cardiomyopathy     Ejection Fraction of 25% improved to 45-50% following biventicular pacing, Status post biventricular dual-chamber ICD - [employer]  . Hypothyroidism   . Pacemaker   . Cardiac defibrillator in place   .  Constipation   . Colon polyps     20 yrs ago    Past Surgical History  Procedure Date  . Coronary artery bypass graft 2001    [facility] - LIMA to LAD, SVG to ramus, SVG to OM/PLB, SVG to RCA   . Appendectomy   . Bilateral tubal ligation   . Cardiac defibrillator placement     [employer]  . Flexible sigmoidoscopy 07/19/1998    Dr. Karilyn Cota  . Tonsillectomy   . Cesarean section     WGN:FAOZHY of systems complete and found to be negative unless listed above  PHYSICAL EXAM BP 132/64  Pulse 70  Resp 18  Ht 5\' 7"  (1.702 m)  Wt 253 lb (114.76 kg)  BMI 39.63 kg/m2  General: Well developed, well nourished, in no acute distress, emotional, tearful Head: Eyes PERRLA, No xanthomas.   Normal cephalic and atramatic  Lungs: Clear bilaterally to auscultation and percussion. Mild bibasilar crackles. No wheezes.  Heart: HRRR S1 S2, distant heart sounds. Pulses are 2+ & equal. No carotid bruit. No JVD.  No  abdominal bruits. No femoral bruits. Abdomen: Bowel sounds are positive, abdomen soft and non-tender without masses or  Hernia's noted. Msk:  Back normal, normal gait. Normal strength and tone for age. Extremities: No clubbing, cyanosis or edema.  DP +1 Neuro: Alert and oriented X 3. Psych:  Sad and tearful affect, responds appropriately    ASSESSMENT AND PLAN

## 2011-09-22 ENCOUNTER — Telehealth: Payer: Self-pay | Admitting: Gastroenterology

## 2011-09-22 NOTE — Telephone Encounter (Signed)
Pt called to cancel her procedure for 6/21 with SF. She is scared and has talked herself out of having it done. She said she just wasn't ready for this. I LMOM for Kim Faint to take off her schedule.

## 2011-09-22 NOTE — Telephone Encounter (Signed)
Tried to f/u with the patient and she will call us back when she is ready to schedule

## 2011-10-02 ENCOUNTER — Encounter (HOSPITAL_COMMUNITY): Admission: RE | Payer: Self-pay | Source: Ambulatory Visit

## 2011-10-02 ENCOUNTER — Ambulatory Visit (HOSPITAL_COMMUNITY): Admission: RE | Admit: 2011-10-02 | Payer: Medicare Other | Source: Ambulatory Visit | Admitting: Gastroenterology

## 2011-10-02 SURGERY — COLONOSCOPY
Anesthesia: Moderate Sedation

## 2011-10-17 ENCOUNTER — Other Ambulatory Visit: Payer: Self-pay | Admitting: Internal Medicine

## 2011-10-17 ENCOUNTER — Other Ambulatory Visit: Payer: Self-pay | Admitting: Endocrinology

## 2011-11-12 ENCOUNTER — Encounter: Payer: Self-pay | Admitting: Internal Medicine

## 2011-11-12 ENCOUNTER — Ambulatory Visit (INDEPENDENT_AMBULATORY_CARE_PROVIDER_SITE_OTHER): Payer: Medicare Other | Admitting: *Deleted

## 2011-11-12 DIAGNOSIS — I429 Cardiomyopathy, unspecified: Secondary | ICD-10-CM

## 2011-11-12 DIAGNOSIS — A5211 Tabes dorsalis: Secondary | ICD-10-CM | POA: Diagnosis not present

## 2011-11-12 DIAGNOSIS — M19079 Primary osteoarthritis, unspecified ankle and foot: Secondary | ICD-10-CM | POA: Diagnosis not present

## 2011-11-12 DIAGNOSIS — M79609 Pain in unspecified limb: Secondary | ICD-10-CM | POA: Diagnosis not present

## 2011-11-12 LAB — ICD DEVICE OBSERVATION
AL AMPLITUDE: 1.8 mv
AL IMPEDENCE ICD: 500 Ohm
ATRIAL PACING ICD: 4 pct
DEVICE MODEL ICD: 512614
LV LEAD THRESHOLD: 1 V
RV LEAD THRESHOLD: 1 V

## 2011-11-12 NOTE — Progress Notes (Signed)
PT CANCELLED PROCEDURE-SHE'S ANXIOUS.

## 2011-11-12 NOTE — Progress Notes (Signed)
ICD check 

## 2011-11-18 ENCOUNTER — Other Ambulatory Visit: Payer: Self-pay | Admitting: Internal Medicine

## 2011-11-19 ENCOUNTER — Other Ambulatory Visit: Payer: Self-pay | Admitting: Cardiology

## 2011-11-19 MED ORDER — ENALAPRIL MALEATE 10 MG PO TABS: 10.0000 mg | ORAL_TABLET | Freq: Two times a day (BID) | ORAL | Status: DC

## 2011-12-08 DIAGNOSIS — E669 Obesity, unspecified: Secondary | ICD-10-CM | POA: Diagnosis not present

## 2011-12-08 DIAGNOSIS — E890 Postprocedural hypothyroidism: Secondary | ICD-10-CM | POA: Diagnosis not present

## 2011-12-08 DIAGNOSIS — E785 Hyperlipidemia, unspecified: Secondary | ICD-10-CM | POA: Diagnosis not present

## 2011-12-08 DIAGNOSIS — E1101 Type 2 diabetes mellitus with hyperosmolarity with coma: Secondary | ICD-10-CM | POA: Diagnosis not present

## 2011-12-08 DIAGNOSIS — E559 Vitamin D deficiency, unspecified: Secondary | ICD-10-CM | POA: Diagnosis not present

## 2011-12-15 DIAGNOSIS — E669 Obesity, unspecified: Secondary | ICD-10-CM | POA: Diagnosis not present

## 2011-12-15 DIAGNOSIS — E785 Hyperlipidemia, unspecified: Secondary | ICD-10-CM | POA: Diagnosis not present

## 2011-12-15 DIAGNOSIS — E039 Hypothyroidism, unspecified: Secondary | ICD-10-CM | POA: Diagnosis not present

## 2011-12-15 DIAGNOSIS — E049 Nontoxic goiter, unspecified: Secondary | ICD-10-CM | POA: Diagnosis not present

## 2011-12-29 DIAGNOSIS — I10 Essential (primary) hypertension: Secondary | ICD-10-CM | POA: Diagnosis not present

## 2011-12-29 DIAGNOSIS — E785 Hyperlipidemia, unspecified: Secondary | ICD-10-CM | POA: Diagnosis not present

## 2011-12-29 DIAGNOSIS — G5790 Unspecified mononeuropathy of unspecified lower limb: Secondary | ICD-10-CM | POA: Diagnosis not present

## 2011-12-29 DIAGNOSIS — Z23 Encounter for immunization: Secondary | ICD-10-CM | POA: Diagnosis not present

## 2011-12-29 DIAGNOSIS — I259 Chronic ischemic heart disease, unspecified: Secondary | ICD-10-CM | POA: Diagnosis not present

## 2012-02-10 ENCOUNTER — Encounter: Payer: Self-pay | Admitting: *Deleted

## 2012-02-15 ENCOUNTER — Encounter: Payer: Self-pay | Admitting: Internal Medicine

## 2012-02-15 ENCOUNTER — Ambulatory Visit (INDEPENDENT_AMBULATORY_CARE_PROVIDER_SITE_OTHER): Payer: Medicare Other | Admitting: Internal Medicine

## 2012-02-15 VITALS — BP 139/60 | HR 75 | Ht 67.0 in | Wt 256.1 lb

## 2012-02-15 DIAGNOSIS — I429 Cardiomyopathy, unspecified: Secondary | ICD-10-CM | POA: Diagnosis not present

## 2012-02-15 DIAGNOSIS — Z95810 Presence of automatic (implantable) cardiac defibrillator: Secondary | ICD-10-CM

## 2012-02-15 DIAGNOSIS — I10 Essential (primary) hypertension: Secondary | ICD-10-CM | POA: Diagnosis not present

## 2012-02-15 LAB — ICD DEVICE OBSERVATION
AL AMPLITUDE: 2 mv
ATRIAL PACING ICD: 10 pct
HV IMPEDENCE: 43 Ohm
LV LEAD THRESHOLD: 1 V
RV LEAD THRESHOLD: 1 V
VENTRICULAR PACING ICD: 94 pct

## 2012-02-15 NOTE — Assessment & Plan Note (Signed)
Her biventricular ICD is working normally. Plan recheck in several months.

## 2012-02-15 NOTE — Assessment & Plan Note (Signed)
Her blood pressure is minimally elevated today. I have encouraged her to increase her physical activity, reduce her salt intake, and continue her current medical therapy.

## 2012-02-15 NOTE — Patient Instructions (Signed)
Your physician recommends that you schedule a follow-up appointment in: 1 year with Dr Taylor and 3 months with Paula  

## 2012-02-15 NOTE — Progress Notes (Signed)
HPI [name] [name] returns today for followup. She is very pleasant obese woman with coronary disease, status post myocardial infarction, chronic systolic heart failure, hypertension, and diabetes. In the interim, she has done well from a heart failure perspective. Unfortunately, she is anxious, and takes Xanax at night. Her anxiety stems around her neighbors. She denies chest pain, syncope, or ICD shock. No Known Allergies   Current Outpatient Prescriptions  Medication Sig Dispense Refill  . ALPRAZolam (XANAX) 1 MG tablet Take 1 mg by mouth at bedtime as needed. Pt said she only takes one or two weekly at bedtime      . aspirin 81 MG tablet Take 81 mg by mouth daily.        . carvedilol (COREG) 25 MG tablet Take 1 tablet (25 mg total) by mouth 2 (two) times daily with a meal.  60 tablet  10  . enalapril (VASOTEC) 10 MG tablet Take 1 tablet (10 mg total) by mouth 2 (two) times daily.  60 tablet  10  . fenofibrate 160 MG tablet Take 160 mg by mouth at bedtime.        . furosemide (LASIX) 40 MG tablet Take 40 mg by mouth 2 (two) times daily.      . insulin aspart (NOVOLOG) 100 UNIT/ML injection Inject into the skin 3 (three) times daily before meals. Sliding Scale      . insulin glargine (LANTUS) 100 UNIT/ML injection Inject 70 Units into the skin 2 (two) times daily.       Marland Kitchen levothyroxine (SYNTHROID, LEVOTHROID) 100 MCG tablet Take 100 mcg by mouth daily.        . Liraglutide (VICTOZA [place]) Inject into the skin as directed.      . multivitamin (THERAGRAN) per tablet Take 1 tablet by mouth daily.        . Omega-3-acid Ethyl Esters (LOVAZA PO) Take by mouth daily.        . potassium chloride SA (KLOR-CON M20) 20 MEQ tablet       . simvastatin (ZOCOR) 80 MG tablet TAKE ONE TABLET DAILY AT BEDTIME  30 tablet  0  . vitamin E 600 UNIT capsule Take 600 Units by mouth daily.        . [DISCONTINUED] KLOR-CON M20 20 MEQ tablet TAKE ONE (1) TABLET EACH DAY  60 tablet  6  . [DISCONTINUED] carvedilol (COREG) 25  MG tablet TAKE ONE TABLET TWICE DAILY  60 tablet  6  . [DISCONTINUED] potassium chloride SA (K-DUR,KLOR-CON) 20 MEQ tablet Take 1 tablet (20 mEq total) by mouth daily.  30 tablet  6     Past Medical History  Diagnosis Date  . Chronic systolic heart failure     Dr Diona Browner  . Diabetes mellitus, type 2   . Hyperlipidemia   . Essential hypertension, benign     Pt denies  . Hypothyroidism   . PVD (peripheral vascular disease)   . Coronary atherosclerosis of native coronary artery     Multivessel, patent grafts 2007  . Ischemic cardiomyopathy     Ejection Fraction of 25% improved to 45-50% following biventicular pacing, Status post biventricular dual-chamber ICD - [employer]  . Hypothyroidism   . Pacemaker   . Cardiac defibrillator in place   . Constipation   . Colon polyps     20 yrs ago    ROS:   All systems reviewed and negative except as noted in the HPI.   Past Surgical History  Procedure Date  .  Coronary artery bypass graft 2001    [facility] - LIMA to LAD, SVG to ramus, SVG to OM/PLB, SVG to RCA   . Appendectomy   . Bilateral tubal ligation   . Cardiac defibrillator placement     [facility]  . Flexible sigmoidoscopy 07/19/1998    Dr. Karilyn Cota  . Tonsillectomy   . Cesarean section      Family History  Problem Relation Age of Onset  . Diabetes Mother   . Diabetes Sister   . Diabetes Father   . Heart attack Father   . Coronary artery disease Sister 71  . Ulcers Mother      History   Social History  . Marital Status: Divorced    Spouse Name: N/A    Number of Children: 1  . Years of Education: N/A   Occupational History  . Full Time: [employer]   . Disabled   .     Social History Main Topics  . Smoking status: Former Smoker    Quit date: 09/16/1991  . Smokeless tobacco: Never Used  . Alcohol Use: No  . Drug Use: No  . Sexually Active: Not on file   Other Topics Concern  . Not on file   Social History  Narrative  . No narrative on file     BP 139/60  Pulse 75  Ht 5\' 7"  (1.702 m)  Wt 256 lb 1.9 oz (116.175 kg)  BMI 40.11 kg/m2  Physical Exam:  Well appearing 66 year old woman, NAD HEENT: Unremarkable Neck:  No JVD, no thyromegally Lungs:  Clear with no wheezes, rales, or rhonchi.well-healed ICD incision. HEART:  Regular rate rhythm, no murmurs, no rubs, no clicks Abd:  soft, positive bowel sounds, no organomegally, no rebound, no guarding Ext:  2 plus pulses, no edema, no cyanosis, no clubbing Skin:  No rashes no nodules Neuro:  CN II through XII intact, motor grossly intact  DEVICE  Normal device function.  See PaceArt for details.   Assess/Plan:

## 2012-02-15 NOTE — Assessment & Plan Note (Signed)
We discussed the importance of weight loss which utilizes diet and exercise.

## 2012-02-22 ENCOUNTER — Other Ambulatory Visit: Payer: Self-pay | Admitting: *Deleted

## 2012-02-22 MED ORDER — SIMVASTATIN 80 MG PO TABS: 80.0000 mg | ORAL_TABLET | Freq: Every day | ORAL | Status: DC

## 2012-02-25 ENCOUNTER — Observation Stay (HOSPITAL_COMMUNITY)
Admission: EM | Admit: 2012-02-25 | Discharge: 2012-02-26 | Disposition: A | Discharge status: Home / Self Care | Payer: Medicare Other | Attending: Internal Medicine | Admitting: Internal Medicine

## 2012-02-25 ENCOUNTER — Emergency Department (HOSPITAL_COMMUNITY): Payer: Medicare Other

## 2012-02-25 ENCOUNTER — Encounter (HOSPITAL_COMMUNITY): Payer: Self-pay | Admitting: Emergency Medicine

## 2012-02-25 DIAGNOSIS — E785 Hyperlipidemia, unspecified: Secondary | ICD-10-CM | POA: Diagnosis present

## 2012-02-25 DIAGNOSIS — S2249XA Multiple fractures of ribs, unspecified side, initial encounter for closed fracture: Secondary | ICD-10-CM | POA: Diagnosis not present

## 2012-02-25 DIAGNOSIS — E119 Type 2 diabetes mellitus without complications: Secondary | ICD-10-CM

## 2012-02-25 DIAGNOSIS — R079 Chest pain, unspecified: Secondary | ICD-10-CM | POA: Insufficient documentation

## 2012-02-25 DIAGNOSIS — I5022 Chronic systolic (congestive) heart failure: Secondary | ICD-10-CM | POA: Insufficient documentation

## 2012-02-25 DIAGNOSIS — S2232XK Fracture of one rib, left side, subsequent encounter for fracture with nonunion: Secondary | ICD-10-CM | POA: Diagnosis present

## 2012-02-25 DIAGNOSIS — Z7982 Long term (current) use of aspirin: Secondary | ICD-10-CM | POA: Diagnosis not present

## 2012-02-25 DIAGNOSIS — I429 Cardiomyopathy, unspecified: Secondary | ICD-10-CM | POA: Diagnosis present

## 2012-02-25 DIAGNOSIS — Z794 Long term (current) use of insulin: Secondary | ICD-10-CM | POA: Insufficient documentation

## 2012-02-25 DIAGNOSIS — E1159 Type 2 diabetes mellitus with other circulatory complications: Secondary | ICD-10-CM | POA: Diagnosis present

## 2012-02-25 DIAGNOSIS — S2239XA Fracture of one rib, unspecified side, initial encounter for closed fracture: Secondary | ICD-10-CM

## 2012-02-25 DIAGNOSIS — I2510 Atherosclerotic heart disease of native coronary artery without angina pectoris: Secondary | ICD-10-CM | POA: Diagnosis present

## 2012-02-25 DIAGNOSIS — W19XXXA Unspecified fall, initial encounter: Secondary | ICD-10-CM | POA: Insufficient documentation

## 2012-02-25 DIAGNOSIS — T1490XA Injury, unspecified, initial encounter: Secondary | ICD-10-CM | POA: Diagnosis not present

## 2012-02-25 DIAGNOSIS — Z6835 Body mass index (BMI) 35.0-35.9, adult: Secondary | ICD-10-CM | POA: Diagnosis present

## 2012-02-25 DIAGNOSIS — E039 Hypothyroidism, unspecified: Secondary | ICD-10-CM | POA: Diagnosis present

## 2012-02-25 DIAGNOSIS — I10 Essential (primary) hypertension: Secondary | ICD-10-CM | POA: Diagnosis not present

## 2012-02-25 DIAGNOSIS — R6889 Other general symptoms and signs: Secondary | ICD-10-CM | POA: Diagnosis not present

## 2012-02-25 DIAGNOSIS — Z95810 Presence of automatic (implantable) cardiac defibrillator: Secondary | ICD-10-CM

## 2012-02-25 DIAGNOSIS — Z950 Presence of cardiac pacemaker: Secondary | ICD-10-CM | POA: Diagnosis present

## 2012-02-25 DIAGNOSIS — R109 Unspecified abdominal pain: Secondary | ICD-10-CM | POA: Diagnosis not present

## 2012-02-25 LAB — POCT I-STAT, CHEM 8
BUN: 22 mg/dL (ref 6–23)
Chloride: 102 mEq/L (ref 96–112)
Creatinine, Ser: 0.9 mg/dL (ref 0.50–1.10)
Glucose, Bld: 259 mg/dL — ABNORMAL HIGH (ref 70–99)
Hemoglobin: 12.6 g/dL (ref 12.0–15.0)
Potassium: 4.5 mEq/L (ref 3.5–5.1)
Sodium: 138 mEq/L (ref 135–145)

## 2012-02-25 LAB — GLUCOSE, CAPILLARY: Glucose-Capillary: 247 mg/dL — ABNORMAL HIGH (ref 70–99)

## 2012-02-25 MED ORDER — BISACODYL 10 MG RE SUPP: 10.0000 mg | Freq: Every day | RECTAL | Status: DC | PRN

## 2012-02-25 MED ORDER — ONDANSETRON HCL 4 MG/2ML IJ SOLN
4.0000 mg | Freq: Once | INTRAMUSCULAR | Status: AC
  Administered 2012-02-25: 4 mg via INTRAVENOUS

## 2012-02-25 MED ORDER — POTASSIUM CHLORIDE CRYS ER 20 MEQ PO TBCR
20.0000 meq | EXTENDED_RELEASE_TABLET | Freq: Two times a day (BID) | ORAL | Status: DC
  Administered 2012-02-25 – 2012-02-26 (×2): 20 meq via ORAL
  Filled 2012-02-25 (×2): qty 1

## 2012-02-25 MED ORDER — ONDANSETRON HCL 4 MG PO TABS: 4.0000 mg | ORAL_TABLET | Freq: Four times a day (QID) | ORAL | Status: DC | PRN

## 2012-02-25 MED ORDER — ONDANSETRON HCL 4 MG/2ML IJ SOLN: 4.0000 mg | Freq: Three times a day (TID) | INTRAMUSCULAR | Status: DC | PRN

## 2012-02-25 MED ORDER — ATORVASTATIN CALCIUM 40 MG PO TABS: 40.0000 mg | ORAL_TABLET | Freq: Every day | ORAL | Status: DC

## 2012-02-25 MED ORDER — TRAZODONE HCL 50 MG PO TABS: 25.0000 mg | ORAL_TABLET | Freq: Every evening | ORAL | Status: DC | PRN

## 2012-02-25 MED ORDER — LEVOTHYROXINE SODIUM 100 MCG PO TABS
100.0000 ug | ORAL_TABLET | Freq: Every day | ORAL | Status: DC
  Administered 2012-02-26: 100 ug via ORAL
  Filled 2012-02-25: qty 1

## 2012-02-25 MED ORDER — FENOFIBRATE 160 MG PO TABS
160.0000 mg | ORAL_TABLET | Freq: Every day | ORAL | Status: DC
  Administered 2012-02-26: 160 mg via ORAL
  Filled 2012-02-25: qty 1

## 2012-02-25 MED ORDER — HYDROMORPHONE HCL PF 1 MG/ML IJ SOLN
1.0000 mg | Freq: Once | INTRAMUSCULAR | Status: AC
  Administered 2012-02-25: 1 mg via INTRAVENOUS

## 2012-02-25 MED ORDER — IBUPROFEN 800 MG PO TABS: 400.0000 mg | ORAL_TABLET | Freq: Four times a day (QID) | ORAL | Status: DC | PRN

## 2012-02-25 MED ORDER — INSULIN ASPART 100 UNIT/ML ~~LOC~~ SOLN
0.0000 [IU] | Freq: Every day | SUBCUTANEOUS | Status: DC
  Administered 2012-02-25: 2 [IU] via SUBCUTANEOUS

## 2012-02-25 MED ORDER — HYDROMORPHONE HCL PF 1 MG/ML IJ SOLN: 1.0000 mg | INTRAMUSCULAR | Status: DC | PRN

## 2012-02-25 MED ORDER — TRAMADOL HCL 50 MG PO TABS: 50.0000 mg | ORAL_TABLET | Freq: Four times a day (QID) | ORAL | Status: DC | PRN

## 2012-02-25 MED ORDER — FUROSEMIDE 40 MG PO TABS
40.0000 mg | ORAL_TABLET | Freq: Two times a day (BID) | ORAL | Status: DC
  Administered 2012-02-26: 40 mg via ORAL
  Filled 2012-02-25: qty 1

## 2012-02-25 MED ORDER — HYDROMORPHONE HCL PF 1 MG/ML IJ SOLN
0.5000 mg | INTRAMUSCULAR | Status: DC | PRN
  Administered 2012-02-25 – 2012-02-26 (×2): 0.5 mg via INTRAVENOUS
  Filled 2012-02-25 (×2): qty 1

## 2012-02-25 MED ORDER — ONDANSETRON HCL 4 MG/2ML IJ SOLN: 4.0000 mg | INTRAMUSCULAR | Status: DC | PRN

## 2012-02-25 MED ORDER — INSULIN ASPART 100 UNIT/ML ~~LOC~~ SOLN
0.0000 [IU] | Freq: Three times a day (TID) | SUBCUTANEOUS | Status: DC
  Administered 2012-02-26: 4 [IU] via SUBCUTANEOUS
  Administered 2012-02-26: 7 [IU] via SUBCUTANEOUS

## 2012-02-25 MED ORDER — ACETAMINOPHEN 325 MG PO TABS: 650.0000 mg | ORAL_TABLET | ORAL | Status: DC | PRN

## 2012-02-25 MED ORDER — INSULIN GLARGINE 100 UNIT/ML ~~LOC~~ SOLN
70.0000 [IU] | Freq: Two times a day (BID) | SUBCUTANEOUS | Status: DC
  Administered 2012-02-25 – 2012-02-26 (×2): 70 [IU] via SUBCUTANEOUS

## 2012-02-25 MED ORDER — ENALAPRIL MALEATE 5 MG PO TABS
10.0000 mg | ORAL_TABLET | Freq: Two times a day (BID) | ORAL | Status: DC
  Administered 2012-02-25 – 2012-02-26 (×2): 10 mg via ORAL
  Filled 2012-02-25 (×2): qty 2

## 2012-02-25 MED ORDER — CARVEDILOL 12.5 MG PO TABS
25.0000 mg | ORAL_TABLET | Freq: Two times a day (BID) | ORAL | Status: DC
  Administered 2012-02-26: 25 mg via ORAL
  Filled 2012-02-25: qty 2

## 2012-02-25 MED ORDER — OXYCODONE-ACETAMINOPHEN 5-325 MG PO TABS: 1.0000 | ORAL_TABLET | ORAL | Status: DC | PRN

## 2012-02-25 MED ORDER — PANTOPRAZOLE SODIUM 40 MG PO TBEC
40.0000 mg | DELAYED_RELEASE_TABLET | Freq: Every day | ORAL | Status: DC
  Administered 2012-02-26: 40 mg via ORAL
  Filled 2012-02-25: qty 1

## 2012-02-25 MED ORDER — OXYCODONE-ACETAMINOPHEN 5-325 MG PO TABS
1.0000 | ORAL_TABLET | Freq: Once | ORAL | Status: AC
  Administered 2012-02-25: 1 via ORAL
  Filled 2012-02-25: qty 1

## 2012-02-25 MED ORDER — FLEET ENEMA 7-19 GM/118ML RE ENEM: 1.0000 | ENEMA | Freq: Once | RECTAL | Status: AC | PRN

## 2012-02-25 MED ORDER — ASPIRIN EC 81 MG PO TBEC
81.0000 mg | DELAYED_RELEASE_TABLET | Freq: Every day | ORAL | Status: DC
  Administered 2012-02-26: 81 mg via ORAL
  Filled 2012-02-25: qty 1

## 2012-02-25 MED ORDER — OMEGA-3-ACID ETHYL ESTERS 1 G PO CAPS
2.0000 g | ORAL_CAPSULE | Freq: Every day | ORAL | Status: DC
  Administered 2012-02-26: 2 g via ORAL
  Filled 2012-02-25: qty 2

## 2012-02-25 NOTE — ED Notes (Signed)
Transported to unit

## 2012-02-25 NOTE — ED Notes (Signed)
Report attempted. Was asked to please wait until after shift change due to influx of recent admissions.

## 2012-02-25 NOTE — ED Notes (Signed)
Pt was able to finally get out of bed, stand and walk. Pt then stated she felt like she needed to be admitted.

## 2012-02-25 NOTE — ED Notes (Signed)
Pt

## 2012-02-25 NOTE — ED Notes (Signed)
Dr Orvan Falconer in to examine patient

## 2012-02-25 NOTE — ED Notes (Signed)
Pt c/o slipping and falling this am. Pain to left back ribcage. Denies hitting head/loc. nad noted.

## 2012-02-25 NOTE — ED Notes (Signed)
Additional orders entered to be completed prior to transport to room #318 I stat chem 8 drawn right wrist

## 2012-02-25 NOTE — ED Notes (Signed)
Patient standing and ambulating at bedside.

## 2012-02-25 NOTE — ED Notes (Signed)
RT paged for IS 

## 2012-02-25 NOTE — H&P (Signed)
[facility] History and Physical  MIRIELLE [name]  ZOX:[id number]  DOB: 03-18-1946   DOA: 02/25/2012   PCP:   Harlow Asa, MD   Chief Complaint:  Mechanical fall and rib fracture today  HPI: [name] [name] is an 66 y.o. female.   Obese Caucasian lady who lives alone, has  diabetic peripheral neuropathy, and Charcot joint left ankle with difficulty ambulating, had a mechanical fall today fell and hit the posterior left side of her chest, was brought into the emergency room and x-rays confirmed fractures of the left eighth and ninth ribs.  Patient has severe pains with movement but not with breathing, received narcotic medications and was very dizzy and unsteady when she got up to be discharged. Hospitalist service was called to assist with management.  Omission was delayed because of attempts to get a labs drawn.  Is no history of fever cough or cold chest pains nausea or vomiting. She does have a history of she may cardiomyopathy with systolic heart failure, but no other contraindication to NSAIDS. He has no history of kidney disease.  Rewiew of Systems:   All systems negative except as marked bold or noted in the HPI;  Constitutional: Negative for malaise, fever and chills. ;  Eyes: Negative for eye pain, redness and discharge. ;  ENMT: Negative for ear pain, hoarseness, nasal congestion, sinus pressure and sore throat. ;  Cardiovascular: Negative for chest pain, palpitations, diaphoresis, dyspnea and peripheral edema. ;  Respiratory: Negative for cough, hemoptysis, wheezing and stridor. ;  Gastrointestinal: Negative for nausea, vomiting, diarrhea, constipation, abdominal pain, melena, blood in stool, hematemesis, jaundice and rectal bleeding. unusual weight loss..   Genitourinary: Negative for frequency, dysuria, incontinence,flank pain and hematuria; Musculoskeletal: Negative for back pain and neck pain. Negative for swelling and trauma.;  Skin: . Negative for pruritus,  rash, abrasions, bruising and skin lesion.; ulcerations Neuro: Negative for headache, land neck stiffness. Negative for weakness, altered level of consciousness , altered mental status, involuntary movement, seizure and syncope.  Psych: negative for anxiety, depression, insomnia, tearfulness, panic attacks, hallucinations, paranoia, suicidal or homicidal ideation   Past Medical History  Diagnosis Date  . Chronic systolic heart failure     Dr Diona Browner  . Diabetes mellitus, type 2   . Hyperlipidemia   . Essential hypertension, benign     Pt denies  . Hypothyroidism   . PVD (peripheral vascular disease)   . Coronary atherosclerosis of native coronary artery     Multivessel, patent grafts 2007  . Ischemic cardiomyopathy     Ejection Fraction of 25% improved to 45-50% following biventicular pacing, Status post biventricular dual-chamber ICD - [employer]  . Hypothyroidism   . Pacemaker   . Cardiac defibrillator in place   . Constipation   . Colon polyps     20 yrs ago    Past Surgical History  Procedure Date  . Coronary artery bypass graft 2001    [facility] - LIMA to LAD, SVG to ramus, SVG to OM/PLB, SVG to RCA   . Appendectomy   . Bilateral tubal ligation   . Cardiac defibrillator placement     [facility]  . Flexible sigmoidoscopy 07/19/1998    Dr. Karilyn Cota  . Tonsillectomy   . Cesarean section     Medications:  HOME MEDS: Prior to Admission medications   Medication Sig Start Date End Date Taking? Authorizing Provider  ALPRAZolam Prudy Feeler) 1 MG tablet Take 1 mg by mouth at bedtime as needed.  Pt said she only takes one or two weekly at bedtime (anxiety)   Yes Historical Provider, MD  aspirin EC 81 MG tablet Take 81 mg by mouth daily.   Yes Historical Provider, MD  carvedilol (COREG) 25 MG tablet Take 1 tablet (25 mg total) by mouth 2 (two) times daily with a meal. 08/14/11  Yes Marinus Maw, MD  enalapril (VASOTEC) 10 MG tablet Take 1 tablet (10 mg  total) by mouth 2 (two) times daily. 11/19/11  Yes Marinus Maw, MD  fenofibrate 160 MG tablet Take 160 mg by mouth at bedtime.     Yes Historical Provider, MD  furosemide (LASIX) 40 MG tablet Take 40 mg by mouth 2 (two) times daily. 08/19/11  Yes Marinus Maw, MD  insulin aspart (NOVOLOG) 100 UNIT/ML injection Inject 15-40 Units into the skin 3 (three) times daily before meals. Sliding Scale   Yes Historical Provider, MD  insulin glargine (LANTUS) 100 UNIT/ML injection Inject 70 Units into the skin 2 (two) times daily.    Yes Historical Provider, MD  levothyroxine (SYNTHROID, LEVOTHROID) 100 MCG tablet Take 100 mcg by mouth daily.     Yes Historical Provider, MD  Liraglutide (VICTOZA [place]) Inject 1.8 Units into the skin as directed.    Yes Historical Provider, MD  multivitamin [facility]) per tablet Take 1 tablet by mouth daily.     Yes Historical Provider, MD  Omega-3 Fatty Acids (FISH OIL) 1000 MG CAPS Take 1,000 mg by mouth daily.   Yes Historical Provider, MD  potassium chloride SA (K-DUR,KLOR-CON) 20 MEQ tablet Take 20 mEq by mouth 2 (two) times daily.   Yes Historical Provider, MD  simvastatin (ZOCOR) 80 MG tablet Take 1 tablet (80 mg total) by mouth at bedtime. 02/22/12  Yes Romero Belling, MD  vitamin E 600 UNIT capsule Take 600 Units by mouth daily.     Yes Historical Provider, MD  oxyCODONE-acetaminophen (ROXICET) 5-325 MG per tablet Take 1 tablet by mouth every 4 (four) hours as needed for pain. 02/25/12   Hilario Quarry, MD     Allergies:  No Known Allergies  Social History:   reports that she quit smoking about 20 years ago. She has never used smokeless tobacco. She reports that she does not drink alcohol or use illicit drugs.  Family History: Family History  Problem Relation Age of Onset  . Diabetes Mother   . Diabetes Sister   . Diabetes Father   . Heart attack Father   . Coronary artery disease Sister 52  . Ulcers Mother      Physical Exam: Filed Vitals:   02/25/12  1333 02/25/12 1808 02/25/12 2119  BP: 180/64 152/57 126/55  Pulse: 79 73 85  Temp: 98.2 F (36.8 C)  98.1 F (36.7 C)  TempSrc: Oral  Oral  Resp: 20 16 20   Height: 5\' 6"  (1.676 m)    Weight: 114.76 kg (253 lb)    SpO2: 100% 93% 93%   Blood pressure 126/55, pulse 85, temperature 98.1 F (36.7 C), temperature source Oral, resp. rate 20, height 5\' 6"  (1.676 m), weight 114.76 kg (253 lb), SpO2 93.00%.  GEN:  Pleasant obese middle-aged Caucasian lady lying in the stretcher in painful distress when she moves; cooperative with exam PSYCH:  alert and oriented x4; does not appear anxious or depressed; affect is appropriate. HEENT: Mucous membranes pink and anicteric; PERRLA; EOM intact; no cervical lymphadenopathy,no carotid bruit; no JVD; thick neck Breasts:: Not examined CHEST WALL: No tenderness  CHEST: Normal respiration, clear to auscultation bilaterally HEART: Regular rate and rhythm; no murmurs rubs or gallops BACK: Bruising left flank ABDOMEN: Obese, soft non-tender; no masses, no organomegaly, normal abdominal bowel sounds; no pannus; no intertriginous candida. Rectal Exam: Not done EXTREMITIES:  age-appropriate arthropathy of the hands and knees; marked deformity of left ankle no edema; no ulcerations. Genitalia: not examined PULSES: 2+ and symmetric SKIN: Normal hydration no rash or ulceration CNS: Cranial nerves 2-12 grossly intact no focal lateralizing neurologic deficit   Labs on Admission:  Basic Metabolic Panel:  Lab 02/25/12 2130  NA 138  K 4.5  CL 102  CO2 --  GLUCOSE 259*  BUN 22  CREATININE 0.90  CALCIUM --  MG --  PHOS --   Liver Function Tests: No results found for this basename: AST:5,ALT:5,ALKPHOS:5,BILITOT:5,PROT:5,ALBUMIN:5 in the last 168 hours No results found for this basename: LIPASE:5,AMYLASE:5 in the last 168 hours No results found for this basename: AMMONIA:5 in the last 168 hours CBC:  Lab 02/25/12 2118  WBC --  NEUTROABS --  HGB 12.6    HCT 37.0  MCV --  PLT --   Cardiac Enzymes: No results found for this basename: CKTOTAL:5,CKMB:5,CKMBINDEX:5,TROPONINI:5 in the last 168 hours BNP: No components found with this basename: POCBNP:5 D-dimer: No components found with this basename: D-DIMER:5 CBG: No results found for this basename: GLUCAP:5 in the last 168 hours  Radiological Exams on Admission: Dg Ribs Unilateral W/chest Left  02/25/2012  *RADIOLOGY REPORT*  Clinical Data: Fall, chest pain  LEFT RIBS AND CHEST - 3+ VIEW  Comparison: 09/14/2011  Findings: Coronary bypass changes noted as well as a left subclavian defibrillator.  Stable heart size and vascularity. Minimal basilar atelectasis versus scarring.  Negative for CHF, pneumonia, effusion or pneumothorax.  No significant interval change. Fractures of the left eighth and likely the ninth ribs noted laterally.  IMPRESSION: Lateral left eighth and likely ninth rib fractures.  Low volume exam with basilar atelectasis  No effusion or pneumothorax.   Original Report Authenticated By: Judie Petit. Miles Costain, M.D.      Assessment/Plan Present on Admission:  . Closed rib fracture . HYPOTHYROIDISM . DIABETES MELLITUS, TYPE II . HYPERLIPIDEMIA . Morbid obesity . Coronary atherosclerosis of native coronary artery . ICD-[employer] . Secondary cardiomyopathy, unspecified   PLAN: We'll bring this lady in on observation to initiate pain management; will use cautiously use NSAIDS with PPI coverage, and Tylenol. Will use tramadol if necessary.  We'll need regular renal function tests, and daily weights, if NSAIDS continued  Consult social worker because she may need some home aides to make living at home comfortable such as bedside commode.  Incentive spirometry  Other plans as per orders.  Code Status: FULL CODE   Disposition Plan: Home later today after arrangements made for bedside commode such   Ixel Boehning Nocturnist [facility] Pager [phone]    02/25/2012, 10:06 PM

## 2012-02-25 NOTE — ED Provider Notes (Signed)
History  TCSN: [id number]  Arrival date & time 02/25/12  1333   First MD Initiated Contact with Patient 02/25/12 [phone]     Chief Complaint  Patient presents with  . Fall  . Chest Pain   Patient is a 66 y.o. female presenting with fall and chest pain. The history is provided by the patient. No language interpreter was used.  Fall Pertinent negatives include no fever, no nausea and no vomiting.  Chest Pain Pertinent negatives for primary symptoms include no fever, no shortness of breath, no nausea and no vomiting.  Pertinent negatives for associated symptoms include no weakness.    Auria A Streett is a 66 y.o. female who presents to the Emergency Department complaining of chest wall pain.   Patient complaining of left upper back pain after fall.  She states she slipped and landed on step stool.  She was able to get up on own.  Then heard popping sound in back.  Patient has not try any interventions for pain.  Pain is located in left mid back sharp severe without radiation.  Patient did not hit head or injure any other part of her body.  She has been ambulatory sine.   Past Medical History  Diagnosis Date  . Chronic systolic heart failure     Dr Diona Browner  . Diabetes mellitus, type 2   . Hyperlipidemia   . Essential hypertension, benign     Pt denies  . Hypothyroidism   . PVD (peripheral vascular disease)   . Coronary atherosclerosis of native coronary artery     Multivessel, patent grafts 2007  . Ischemic cardiomyopathy     Ejection Fraction of 25% improved to 45-50% following biventicular pacing, Status post biventricular dual-chamber ICD - [employer]  . Hypothyroidism   . Pacemaker   . Cardiac defibrillator in place   . Constipation   . Colon polyps     20 yrs ago    Past Surgical History  Procedure Date  . Coronary artery bypass graft 2001    [facility] - LIMA to LAD, SVG to ramus, SVG to OM/PLB, SVG to RCA   . Appendectomy   . Bilateral tubal ligation    . Cardiac defibrillator placement     [employer]  . Flexible sigmoidoscopy 07/19/1998    Dr. Karilyn Cota  . Tonsillectomy   . Cesarean section     Family History  Problem Relation Age of Onset  . Diabetes Mother   . Diabetes Sister   . Diabetes Father   . Heart attack Father   . Coronary artery disease Sister 5  . Ulcers Mother     History  Substance Use Topics  . Smoking status: Former Smoker    Quit date: 09/16/1991  . Smokeless tobacco: Never Used  . Alcohol Use: No    OB History    Grav Para Term Preterm Abortions TAB SAB Ect Mult Living                  Review of Systems  Constitutional: Negative for fever and chills.  HENT: Negative for neck pain.   Respiratory: Negative for shortness of breath.   Cardiovascular: Positive for chest pain.  Gastrointestinal: Negative for nausea and vomiting.  Musculoskeletal: Positive for back pain.  Neurological: Negative for weakness.  All other systems reviewed and are negative.    Allergies  Review of patient's allergies indicates no known allergies.  Home Medications   Current Outpatient Rx  Name  Route  Sig  Dispense  Refill  . ALPRAZOLAM 1 MG PO TABS   Oral   Take 1 mg by mouth at bedtime as needed. Pt said she only takes one or two weekly at bedtime (anxiety)         . ASPIRIN EC 81 MG PO TBEC   Oral   Take 81 mg by mouth daily.         Marland Kitchen CARVEDILOL 25 MG PO TABS   Oral   Take 1 tablet (25 mg total) by mouth 2 (two) times daily with a meal.   60 tablet   10     Patient needs to make appointment   . ENALAPRIL MALEATE 10 MG PO TABS   Oral   Take 1 tablet (10 mg total) by mouth 2 (two) times daily.   60 tablet   10     Send future refill request to the [facility] ...   . FENOFIBRATE 160 MG PO TABS   Oral   Take 160 mg by mouth at bedtime.           . FUROSEMIDE 40 MG PO TABS   Oral   Take 40 mg by mouth 2 (two) times daily.         . INSULIN ASPART 100 UNIT/ML [place]  SOLN   Subcutaneous   Inject 15-40 Units into the skin 3 (three) times daily before meals. Sliding Scale         . INSULIN GLARGINE 100 UNIT/ML [place] SOLN   Subcutaneous   Inject 70 Units into the skin 2 (two) times daily.          Marland Kitchen LEVOTHYROXINE SODIUM 100 MCG PO TABS   Oral   Take 100 mcg by mouth daily.           Marland Kitchen VICTOZA [place]   Subcutaneous   Inject 1.8 Units into the skin as directed.          . MULTIVITAMINS PO TABS   Oral   Take 1 tablet by mouth daily.           Marland Kitchen FISH OIL 1000 MG PO CAPS   Oral   Take 1,000 mg by mouth daily.         Marland Kitchen POTASSIUM CHLORIDE [place] ER 20 MEQ PO TBCR   Oral   Take 20 mEq by mouth 2 (two) times daily.         Marland Kitchen SIMVASTATIN 80 MG PO TABS   Oral   Take 1 tablet (80 mg total) by mouth at bedtime.   30 tablet   0     NO MORE REFILLS. PT NEEDS TO SCHEDULE APPT   . VITAMIN E 600 UNITS PO CAPS   Oral   Take 600 Units by mouth daily.             Triage Vitals: BP 180/64  Pulse 79  Temp 98.2 F (36.8 C) (Oral)  Resp 20  Ht 5\' 6"  (1.676 m)  Wt 253 lb (114.76 kg)  BMI 40.84 kg/m2  SpO2 100%  Physical Exam  Nursing note and vitals reviewed. Constitutional: She is oriented to person, place, and time. She appears well-developed and well-nourished. No distress.  HENT:  Head: Normocephalic and atraumatic.  Eyes: EOM are normal.  Neck: Neck supple. No tracheal deviation present.  Cardiovascular: Normal rate.   Pulmonary/Chest: Effort normal. No respiratory distress.  Musculoskeletal:       Back:  Contusion left mid back lateral  Neurological: She is alert and oriented to person, place, and time.  Skin: Skin is warm and dry.  Psychiatric: She has a normal mood and affect. Her behavior is normal.    ED Course  Procedures (including critical care time) DIAGNOSTIC STUDIES: Oxygen Saturation is 100% on room air, normal by my interpretation.    COORDINATION OF CARE: At 1616 Discussed treatment plan with patient  which includes  See below. Patient agrees.   Labs Reviewed - No data to display Dg Ribs Unilateral W/chest Left  02/25/2012  *RADIOLOGY REPORT*  Clinical Data: Fall, chest pain  LEFT RIBS AND CHEST - 3+ VIEW  Comparison: 09/14/2011  Findings: Coronary bypass changes noted as well as a left subclavian defibrillator.  Stable heart size and vascularity. Minimal basilar atelectasis versus scarring.  Negative for CHF, pneumonia, effusion or pneumothorax.  No significant interval change. Fractures of the left eighth and likely the ninth ribs noted laterally.  IMPRESSION: Lateral left eighth and likely ninth rib fractures.  Low volume exam with basilar atelectasis  No effusion or pneumothorax.   Original Report Authenticated By: Judie Petit. Miles Costain, M.D.      No diagnosis found.    MDM  4:16 PM Discussed radiograph results with patient and she feels much improved.  She states she has family members to stay with her and assist.  We discussed the need to move around and deep breathing.  Patient voices understanding.  We will ambulate her and get is prior to discharge.  Patient requests bedside commode for use at home.     4:55 PM Patient being instructed on is use.   5:41 PM Patient ambulating and request walker.   Hilario Quarry, MD 02/25/12 [phone]

## 2012-02-26 DIAGNOSIS — S2239XA Fracture of one rib, unspecified side, initial encounter for closed fracture: Secondary | ICD-10-CM | POA: Diagnosis not present

## 2012-02-26 DIAGNOSIS — I10 Essential (primary) hypertension: Secondary | ICD-10-CM | POA: Diagnosis not present

## 2012-02-26 DIAGNOSIS — E119 Type 2 diabetes mellitus without complications: Secondary | ICD-10-CM | POA: Diagnosis not present

## 2012-02-26 LAB — HEPATIC FUNCTION PANEL
ALT: 35 U/L (ref 0–35)
Albumin: 3.7 g/dL (ref 3.5–5.2)
Alkaline Phosphatase: 45 U/L (ref 39–117)
Total Protein: 6.8 g/dL (ref 6.0–8.3)

## 2012-02-26 LAB — BASIC METABOLIC PANEL
Chloride: 101 mEq/L (ref 96–112)
GFR calc Af Amer: >90 mL/min (ref >90)
GFR calc non Af Amer: 88 mL/min — ABNORMAL LOW (ref >90)
Potassium: 4.3 mEq/L (ref 3.5–5.1)
Sodium: 137 mEq/L (ref 135–145)

## 2012-02-26 LAB — CBC
HCT: 37.8 % (ref 36.0–46.0)
Hemoglobin: 12.5 g/dL (ref 12.0–15.0)
WBC: 8.7 10*3/uL (ref 4.0–10.5)

## 2012-02-26 LAB — URINALYSIS, ROUTINE W REFLEX MICROSCOPIC
Leukocytes, UA: NEGATIVE
Nitrite: NEGATIVE
Specific Gravity, Urine: 1.025 (ref 1.005–1.030)
pH: 5.5 (ref 5.0–8.0)

## 2012-02-26 LAB — GLUCOSE, CAPILLARY: Glucose-Capillary: 166 mg/dL — ABNORMAL HIGH (ref 70–99)

## 2012-02-26 MED ORDER — FENOFIBRATE 145 MG PO TABS
ORAL_TABLET | ORAL | Status: AC
  Filled 2012-02-26: qty 1

## 2012-02-26 MED ORDER — SODIUM CHLORIDE 0.9 % IV SOLN
250.0000 mL | INTRAVENOUS | Status: DC | PRN
  Administered 2012-02-26: 250 mL via INTRAVENOUS

## 2012-02-26 MED ORDER — SODIUM CHLORIDE 0.9 % IJ SOLN: 3.0000 mL | INTRAMUSCULAR | Status: DC | PRN

## 2012-02-26 MED ORDER — OXYCODONE-ACETAMINOPHEN 5-325 MG PO TABS: 1.0000 | ORAL_TABLET | ORAL | Status: DC | PRN

## 2012-02-26 MED ORDER — TRAMADOL HCL 50 MG PO TABS: 50.0000 mg | ORAL_TABLET | Freq: Four times a day (QID) | ORAL | Status: DC | PRN

## 2012-02-26 MED ORDER — SODIUM CHLORIDE 0.9 % IJ SOLN: 3.0000 mL | Freq: Two times a day (BID) | INTRAMUSCULAR | Status: DC

## 2012-02-26 NOTE — Care Management Note (Signed)
    Page 1 of 2   02/26/2012     11:10:29 AM   CARE MANAGEMENT NOTE 02/26/2012  Patient:  [name] [name], [name] [name]   Account Number:  [id number]  Date Initiated:  02/26/2012  Documentation initiated by:  Rosemary Holms  Subjective/Objective Assessment:   Pt admitted from home where she lives alone but has family and church friends to assist her. She slipped going to the bathroom and fractured ribs when she fell. Pt wishes to return home with [facility] and DME     Action/Plan:   DME HH   Anticipated DC Date:  02/26/2012   Anticipated DC Plan:  HOME W HOME HEALTH SERVICES      DC Planning Services  CM consult      PAC Choice  DURABLE MEDICAL EQUIPMENT  HOME HEALTH   Choice offered to / List presented to:  C-1 Patient   DME arranged  BEDSIDE COMMODE  WALKER - ROLLING      DME agency  [facility]     HH arranged  HH-1 RN  HH-2 PT  HH-4 NURSE'S AIDE      HH agency  [facility]   Status of service:  Completed, signed off Medicare Important Message given?   (If response is "NO", the following Medicare IM given date fields will be blank) Date Medicare IM given:   Date Additional Medicare IM given:    Discharge Disposition:  HOME W HOME HEALTH SERVICES  Per UR Regulation:    If discussed at Long Length of Stay Meetings, dates discussed:    Comments:  02/26/12 1030 Aryonna Gunnerson RN BSN Spoke to pt who states she wishes to go home with [facility]. Pt and MD believe RW and BSC would benefit recovery. [facility] delivered both to hospital room for DC.

## 2012-02-26 NOTE — Discharge Summary (Signed)
Physician Discharge Summary  [name] [name] ZOX:[id number] DOB: Aug 30, 1945 DOA: 02/25/2012  PCP: Harlow Asa, MD  Admit date: 02/25/2012 Discharge date: 02/26/2012  Time spent: Greater than 30 minutes  Recommendations for Outpatient Follow-up:  1. Home health physical therapy and DME supplies. 2. Follow with primary care physician.  Discharge Diagnoses:  1. Left rib fractures eighth and ninth ribs. Mechanical fall. 2. Type 2 diabetes mellitus. 3. Hyperlipidemia. 4. Morbid obesity.   Discharge Condition: Stable.  Diet recommendation: Carbohydrate modified diet.  Filed Weights   02/25/12 1333 02/25/12 2233  Weight: 114.76 kg (253 lb) 115.1 kg (253 lb 12 oz)    History of present illness:  This very pleasant 66 year old lady was admitted yesterday with left-sided chest pain. Please see initial history as outlined below: HPI:  [name] [name] is an 66 y.o. female. Obese Caucasian lady who lives alone, has diabetic peripheral neuropathy, and Charcot joint left ankle with difficulty ambulating, had a mechanical fall today fell and hit the posterior left side of her chest, was brought into the emergency room and x-rays confirmed fractures of the left eighth and ninth ribs.  Patient has severe pains with movement but not with breathing, received narcotic medications and was very dizzy and unsteady when she got up to be discharged. Hospitalist service was called to assist with management.  Omission was delayed because of attempts to get a labs drawn.  Is no history of fever cough or cold chest pains nausea or vomiting.  She does have a history of she may cardiomyopathy with systolic heart failure, but no other contraindication to NSAIDS.  He has no history of kidney disease.  Hospital Course:  The patient has done reasonably well overnight and her pain is reasonably well controlled with analgesics. There is no evidence of pneumonia or pneumothorax on chest x-ray or clinically. She  will require home health physical therapy as well as some DME supplies which we will order. Mainly she will need pain control for the next few weeks until her fracture is healed.  Procedures:  None.   Consultations:  None.  Discharge Exam: Filed Vitals:   02/25/12 2233 02/26/12 0259 02/26/12 0603 02/26/12 0606  BP: 152/70 104/63 121/76 156/89  Pulse: 94 83 94 83  Temp: 98 F (36.7 C) 98 F (36.7 C) 98.2 F (36.8 C) 98.1 F (36.7 C)  TempSrc: Oral Oral Oral Oral  Resp: 20 20 20 20   Height: 5\' 6"  (1.676 m)     Weight: 115.1 kg (253 lb 12 oz)     SpO2:  94% 94% 91%    General: She looks systemically well. She does not appear to be in any acute pain at the present time. Cardiovascular: Heart sounds are present and normal. Respiratory: Lung fields are entirely clear. There is no clinical evidence of pneumothorax or pneumonia. She is alert and orientated.  Discharge Instructions  Discharge Orders    Future Appointments: Provider: Department: Dept Phone: Center:   03/21/2012 3:20 PM Jonelle Sidle, MD [facility] [place] [phone] [facility]   05/27/2012 9:00 AM [facility] [place] [phone] [facility]     Future Orders Please Complete By Expires   Walker rolling      DME Bedside commode      Diet - low sodium heart healthy      Increase activity slowly          Medication List     As of 02/26/2012  9:35 AM  TAKE these medications         ALPRAZolam 1 MG tablet   Commonly known as: XANAX   Take 1 mg by mouth at bedtime as needed. Pt said she only takes one or two weekly at bedtime (anxiety)      aspirin EC 81 MG tablet   Take 81 mg by mouth daily.      carvedilol 25 MG tablet   Commonly known as: COREG   Take 1 tablet (25 mg total) by mouth 2 (two) times daily with a meal.      enalapril 10 MG tablet   Commonly known as: VASOTEC   Take 1 tablet (10 mg total) by mouth 2 (two) times daily.       fenofibrate 160 MG tablet   Take 160 mg by mouth at bedtime.      Fish Oil 1000 MG Caps   Take 1,000 mg by mouth daily.      furosemide 40 MG tablet   Commonly known as: LASIX   Take 40 mg by mouth 2 (two) times daily.      insulin aspart 100 UNIT/ML injection   Commonly known as: novoLOG   Inject 15-40 Units into the skin 3 (three) times daily before meals. Sliding Scale      LANTUS 100 UNIT/ML injection   Generic drug: insulin glargine   Inject 70 Units into the skin 2 (two) times daily.      levothyroxine 100 MCG tablet   Commonly known as: SYNTHROID, LEVOTHROID   Take 100 mcg by mouth daily.      multivitamin per tablet   Take 1 tablet by mouth daily.      oxyCODONE-acetaminophen 5-325 MG per tablet   Commonly known as: PERCOCET/ROXICET   Take 1 tablet by mouth every 4 (four) hours as needed for pain.      potassium chloride SA 20 MEQ tablet   Commonly known as: K-DUR,KLOR-CON   Take 20 mEq by mouth 2 (two) times daily.      simvastatin 80 MG tablet   Commonly known as: ZOCOR   Take 1 tablet (80 mg total) by mouth at bedtime.      traMADol 50 MG tablet   Commonly known as: ULTRAM   Take 1 tablet (50 mg total) by mouth every 6 (six) hours as needed.      VICTOZA [place]   Inject 1.8 Units into the skin as directed.      vitamin E 600 UNIT capsule   Take 600 Units by mouth daily.          The results of significant diagnostics from this hospitalization (including imaging, microbiology, ancillary and laboratory) are listed below for reference.    Significant Diagnostic Studies: Dg Ribs Unilateral W/chest Left  02/25/2012  *RADIOLOGY REPORT*  Clinical Data: Fall, chest pain  LEFT RIBS AND CHEST - 3+ VIEW  Comparison: 09/14/2011  Findings: Coronary bypass changes noted as well as a left subclavian defibrillator.  Stable heart size and vascularity. Minimal basilar atelectasis versus scarring.  Negative for CHF, pneumonia, effusion or pneumothorax.  No significant  interval change. Fractures of the left eighth and likely the ninth ribs noted laterally.  IMPRESSION: Lateral left eighth and likely ninth rib fractures.  Low volume exam with basilar atelectasis  No effusion or pneumothorax.   Original Report Authenticated By: Judie Petit. Miles Costain, M.D.         Labs: Basic Metabolic Panel:  Lab 02/26/12 1610 02/25/12 2118  NA 137  138  K 4.3 4.5  CL 101 102  CO2 28 --  GLUCOSE 177* 259*  BUN 18 22  CREATININE 0.73 0.90  CALCIUM 9.4 --  MG -- --  PHOS -- --   Liver Function Tests:  Lab 02/26/12 0521  AST 31  ALT 35  ALKPHOS 45  BILITOT 0.5  PROT 6.8  ALBUMIN 3.7     CBC:  Lab 02/26/12 0521 02/25/12 2118  WBC 8.7 --  NEUTROABS -- --  HGB 12.5 12.6  HCT 37.8 37.0  MCV 93.3 --  PLT 314 --     CBG:  Lab 02/26/12 0741 02/25/12 2245  GLUCAP 166* 247*       Signed:  Alasha Mcguinness C  [facility] 02/26/2012, 9:35 AM

## 2012-02-26 NOTE — Plan of Care (Signed)
Problem: Discharge Progression Outcomes Goal: Activity appropriate for discharge plan Outcome: Completed/Met Date Met:  02/26/12 Ambulated in room prior to discharge Pain was most severe when changing form lying to sitting position Goal: Other Discharge Outcomes/Goals Outcome: Completed/Met Date Met:  02/26/12 Discharged to home with home health to see  Potty chair and walker.  Family with pt

## 2012-02-26 NOTE — Progress Notes (Signed)
UR Chart Review Completed  

## 2012-02-26 NOTE — Clinical Social Work Note (Signed)
CSW received referral for home equipment. CM aware and to assist.   Derenda Fennel, LCSW [phone]

## 2012-03-17 DIAGNOSIS — L97509 Non-pressure chronic ulcer of other part of unspecified foot with unspecified severity: Secondary | ICD-10-CM | POA: Diagnosis not present

## 2012-03-18 DIAGNOSIS — I739 Peripheral vascular disease, unspecified: Secondary | ICD-10-CM | POA: Diagnosis not present

## 2012-03-18 DIAGNOSIS — IMO0001 Reserved for inherently not codable concepts without codable children: Secondary | ICD-10-CM | POA: Diagnosis not present

## 2012-03-18 DIAGNOSIS — L97509 Non-pressure chronic ulcer of other part of unspecified foot with unspecified severity: Secondary | ICD-10-CM | POA: Diagnosis not present

## 2012-03-18 DIAGNOSIS — L03039 Cellulitis of unspecified toe: Secondary | ICD-10-CM | POA: Diagnosis not present

## 2012-03-18 DIAGNOSIS — E1149 Type 2 diabetes mellitus with other diabetic neurological complication: Secondary | ICD-10-CM | POA: Diagnosis not present

## 2012-03-18 DIAGNOSIS — L02619 Cutaneous abscess of unspecified foot: Secondary | ICD-10-CM | POA: Diagnosis not present

## 2012-03-19 DIAGNOSIS — L97509 Non-pressure chronic ulcer of other part of unspecified foot with unspecified severity: Secondary | ICD-10-CM | POA: Diagnosis not present

## 2012-03-19 DIAGNOSIS — IMO0001 Reserved for inherently not codable concepts without codable children: Secondary | ICD-10-CM | POA: Diagnosis not present

## 2012-03-19 DIAGNOSIS — E1149 Type 2 diabetes mellitus with other diabetic neurological complication: Secondary | ICD-10-CM | POA: Diagnosis not present

## 2012-03-19 DIAGNOSIS — I739 Peripheral vascular disease, unspecified: Secondary | ICD-10-CM | POA: Diagnosis not present

## 2012-03-19 DIAGNOSIS — L02619 Cutaneous abscess of unspecified foot: Secondary | ICD-10-CM | POA: Diagnosis not present

## 2012-03-21 ENCOUNTER — Encounter: Payer: Medicare Other | Admitting: Cardiology

## 2012-03-21 ENCOUNTER — Encounter: Payer: Self-pay | Admitting: Cardiology

## 2012-03-21 DIAGNOSIS — I739 Peripheral vascular disease, unspecified: Secondary | ICD-10-CM | POA: Diagnosis not present

## 2012-03-21 DIAGNOSIS — L97509 Non-pressure chronic ulcer of other part of unspecified foot with unspecified severity: Secondary | ICD-10-CM | POA: Diagnosis not present

## 2012-03-21 DIAGNOSIS — E1149 Type 2 diabetes mellitus with other diabetic neurological complication: Secondary | ICD-10-CM | POA: Diagnosis not present

## 2012-03-21 DIAGNOSIS — L03039 Cellulitis of unspecified toe: Secondary | ICD-10-CM | POA: Diagnosis not present

## 2012-03-21 DIAGNOSIS — IMO0001 Reserved for inherently not codable concepts without codable children: Secondary | ICD-10-CM | POA: Diagnosis not present

## 2012-03-21 NOTE — Progress Notes (Signed)
No show  This encounter was created in error - please disregard.

## 2012-03-23 DIAGNOSIS — L02619 Cutaneous abscess of unspecified foot: Secondary | ICD-10-CM | POA: Diagnosis not present

## 2012-03-23 DIAGNOSIS — L97509 Non-pressure chronic ulcer of other part of unspecified foot with unspecified severity: Secondary | ICD-10-CM | POA: Diagnosis not present

## 2012-03-23 DIAGNOSIS — IMO0001 Reserved for inherently not codable concepts without codable children: Secondary | ICD-10-CM | POA: Diagnosis not present

## 2012-03-23 DIAGNOSIS — I739 Peripheral vascular disease, unspecified: Secondary | ICD-10-CM | POA: Diagnosis not present

## 2012-03-23 DIAGNOSIS — E1149 Type 2 diabetes mellitus with other diabetic neurological complication: Secondary | ICD-10-CM | POA: Diagnosis not present

## 2012-03-25 DIAGNOSIS — I739 Peripheral vascular disease, unspecified: Secondary | ICD-10-CM | POA: Diagnosis not present

## 2012-03-25 DIAGNOSIS — IMO0001 Reserved for inherently not codable concepts without codable children: Secondary | ICD-10-CM | POA: Diagnosis not present

## 2012-03-25 DIAGNOSIS — L03039 Cellulitis of unspecified toe: Secondary | ICD-10-CM | POA: Diagnosis not present

## 2012-03-25 DIAGNOSIS — E1149 Type 2 diabetes mellitus with other diabetic neurological complication: Secondary | ICD-10-CM | POA: Diagnosis not present

## 2012-03-25 DIAGNOSIS — L02619 Cutaneous abscess of unspecified foot: Secondary | ICD-10-CM | POA: Diagnosis not present

## 2012-03-25 DIAGNOSIS — L97509 Non-pressure chronic ulcer of other part of unspecified foot with unspecified severity: Secondary | ICD-10-CM | POA: Diagnosis not present

## 2012-03-28 ENCOUNTER — Other Ambulatory Visit: Payer: Self-pay

## 2012-03-30 DIAGNOSIS — E1149 Type 2 diabetes mellitus with other diabetic neurological complication: Secondary | ICD-10-CM | POA: Diagnosis not present

## 2012-03-30 DIAGNOSIS — L97509 Non-pressure chronic ulcer of other part of unspecified foot with unspecified severity: Secondary | ICD-10-CM | POA: Diagnosis not present

## 2012-03-30 DIAGNOSIS — IMO0001 Reserved for inherently not codable concepts without codable children: Secondary | ICD-10-CM | POA: Diagnosis not present

## 2012-03-30 DIAGNOSIS — I739 Peripheral vascular disease, unspecified: Secondary | ICD-10-CM | POA: Diagnosis not present

## 2012-03-30 DIAGNOSIS — L03039 Cellulitis of unspecified toe: Secondary | ICD-10-CM | POA: Diagnosis not present

## 2012-03-31 DIAGNOSIS — E1149 Type 2 diabetes mellitus with other diabetic neurological complication: Secondary | ICD-10-CM | POA: Diagnosis not present

## 2012-03-31 DIAGNOSIS — M86179 Other acute osteomyelitis, unspecified ankle and foot: Secondary | ICD-10-CM | POA: Diagnosis not present

## 2012-03-31 DIAGNOSIS — L97509 Non-pressure chronic ulcer of other part of unspecified foot with unspecified severity: Secondary | ICD-10-CM | POA: Diagnosis not present

## 2012-04-01 DIAGNOSIS — E1169 Type 2 diabetes mellitus with other specified complication: Secondary | ICD-10-CM | POA: Diagnosis not present

## 2012-04-01 DIAGNOSIS — E1142 Type 2 diabetes mellitus with diabetic polyneuropathy: Secondary | ICD-10-CM | POA: Diagnosis not present

## 2012-04-01 DIAGNOSIS — M869 Osteomyelitis, unspecified: Secondary | ICD-10-CM | POA: Diagnosis not present

## 2012-04-01 DIAGNOSIS — L97509 Non-pressure chronic ulcer of other part of unspecified foot with unspecified severity: Secondary | ICD-10-CM | POA: Diagnosis not present

## 2012-04-01 DIAGNOSIS — M86179 Other acute osteomyelitis, unspecified ankle and foot: Secondary | ICD-10-CM | POA: Diagnosis not present

## 2012-04-01 DIAGNOSIS — Z95810 Presence of automatic (implantable) cardiac defibrillator: Secondary | ICD-10-CM | POA: Diagnosis not present

## 2012-04-01 DIAGNOSIS — M205X9 Other deformities of toe(s) (acquired), unspecified foot: Secondary | ICD-10-CM | POA: Diagnosis not present

## 2012-04-01 DIAGNOSIS — Z794 Long term (current) use of insulin: Secondary | ICD-10-CM | POA: Diagnosis not present

## 2012-04-01 DIAGNOSIS — L089 Local infection of the skin and subcutaneous tissue, unspecified: Secondary | ICD-10-CM | POA: Diagnosis not present

## 2012-04-01 DIAGNOSIS — E1149 Type 2 diabetes mellitus with other diabetic neurological complication: Secondary | ICD-10-CM | POA: Diagnosis not present

## 2012-04-01 DIAGNOSIS — Z951 Presence of aortocoronary bypass graft: Secondary | ICD-10-CM | POA: Diagnosis not present

## 2012-04-01 DIAGNOSIS — M9080 Osteopathy in diseases classified elsewhere, unspecified site: Secondary | ICD-10-CM | POA: Diagnosis not present

## 2012-04-01 DIAGNOSIS — L98499 Non-pressure chronic ulcer of skin of other sites with unspecified severity: Secondary | ICD-10-CM | POA: Diagnosis not present

## 2012-04-05 DIAGNOSIS — IMO0001 Reserved for inherently not codable concepts without codable children: Secondary | ICD-10-CM | POA: Diagnosis not present

## 2012-04-05 DIAGNOSIS — I739 Peripheral vascular disease, unspecified: Secondary | ICD-10-CM | POA: Diagnosis not present

## 2012-04-05 DIAGNOSIS — E1149 Type 2 diabetes mellitus with other diabetic neurological complication: Secondary | ICD-10-CM | POA: Diagnosis not present

## 2012-04-05 DIAGNOSIS — L03039 Cellulitis of unspecified toe: Secondary | ICD-10-CM | POA: Diagnosis not present

## 2012-04-05 DIAGNOSIS — L97509 Non-pressure chronic ulcer of other part of unspecified foot with unspecified severity: Secondary | ICD-10-CM | POA: Diagnosis not present

## 2012-04-11 DIAGNOSIS — I739 Peripheral vascular disease, unspecified: Secondary | ICD-10-CM | POA: Diagnosis not present

## 2012-04-11 DIAGNOSIS — IMO0001 Reserved for inherently not codable concepts without codable children: Secondary | ICD-10-CM | POA: Diagnosis not present

## 2012-04-11 DIAGNOSIS — E039 Hypothyroidism, unspecified: Secondary | ICD-10-CM | POA: Diagnosis not present

## 2012-04-11 DIAGNOSIS — E785 Hyperlipidemia, unspecified: Secondary | ICD-10-CM | POA: Diagnosis not present

## 2012-04-11 DIAGNOSIS — E119 Type 2 diabetes mellitus without complications: Secondary | ICD-10-CM | POA: Diagnosis not present

## 2012-04-11 DIAGNOSIS — L02619 Cutaneous abscess of unspecified foot: Secondary | ICD-10-CM | POA: Diagnosis not present

## 2012-04-11 DIAGNOSIS — L97509 Non-pressure chronic ulcer of other part of unspecified foot with unspecified severity: Secondary | ICD-10-CM | POA: Diagnosis not present

## 2012-04-11 DIAGNOSIS — E1149 Type 2 diabetes mellitus with other diabetic neurological complication: Secondary | ICD-10-CM | POA: Diagnosis not present

## 2012-04-18 DIAGNOSIS — E669 Obesity, unspecified: Secondary | ICD-10-CM | POA: Diagnosis not present

## 2012-04-18 DIAGNOSIS — E039 Hypothyroidism, unspecified: Secondary | ICD-10-CM | POA: Diagnosis not present

## 2012-04-18 DIAGNOSIS — E785 Hyperlipidemia, unspecified: Secondary | ICD-10-CM | POA: Diagnosis not present

## 2012-04-19 DIAGNOSIS — E1149 Type 2 diabetes mellitus with other diabetic neurological complication: Secondary | ICD-10-CM | POA: Diagnosis not present

## 2012-04-19 DIAGNOSIS — L03039 Cellulitis of unspecified toe: Secondary | ICD-10-CM | POA: Diagnosis not present

## 2012-04-19 DIAGNOSIS — I739 Peripheral vascular disease, unspecified: Secondary | ICD-10-CM | POA: Diagnosis not present

## 2012-04-19 DIAGNOSIS — L97509 Non-pressure chronic ulcer of other part of unspecified foot with unspecified severity: Secondary | ICD-10-CM | POA: Diagnosis not present

## 2012-04-19 DIAGNOSIS — IMO0001 Reserved for inherently not codable concepts without codable children: Secondary | ICD-10-CM | POA: Diagnosis not present

## 2012-04-20 DIAGNOSIS — E1149 Type 2 diabetes mellitus with other diabetic neurological complication: Secondary | ICD-10-CM | POA: Diagnosis not present

## 2012-04-20 DIAGNOSIS — L97509 Non-pressure chronic ulcer of other part of unspecified foot with unspecified severity: Secondary | ICD-10-CM | POA: Diagnosis not present

## 2012-04-20 DIAGNOSIS — L03039 Cellulitis of unspecified toe: Secondary | ICD-10-CM | POA: Diagnosis not present

## 2012-04-20 DIAGNOSIS — IMO0001 Reserved for inherently not codable concepts without codable children: Secondary | ICD-10-CM | POA: Diagnosis not present

## 2012-04-20 DIAGNOSIS — I739 Peripheral vascular disease, unspecified: Secondary | ICD-10-CM | POA: Diagnosis not present

## 2012-04-23 DIAGNOSIS — L03039 Cellulitis of unspecified toe: Secondary | ICD-10-CM | POA: Diagnosis not present

## 2012-04-23 DIAGNOSIS — L97509 Non-pressure chronic ulcer of other part of unspecified foot with unspecified severity: Secondary | ICD-10-CM | POA: Diagnosis not present

## 2012-04-23 DIAGNOSIS — I739 Peripheral vascular disease, unspecified: Secondary | ICD-10-CM | POA: Diagnosis not present

## 2012-04-23 DIAGNOSIS — IMO0001 Reserved for inherently not codable concepts without codable children: Secondary | ICD-10-CM | POA: Diagnosis not present

## 2012-04-23 DIAGNOSIS — E1149 Type 2 diabetes mellitus with other diabetic neurological complication: Secondary | ICD-10-CM | POA: Diagnosis not present

## 2012-04-25 DIAGNOSIS — I739 Peripheral vascular disease, unspecified: Secondary | ICD-10-CM | POA: Diagnosis not present

## 2012-04-25 DIAGNOSIS — E1149 Type 2 diabetes mellitus with other diabetic neurological complication: Secondary | ICD-10-CM | POA: Diagnosis not present

## 2012-04-25 DIAGNOSIS — L02619 Cutaneous abscess of unspecified foot: Secondary | ICD-10-CM | POA: Diagnosis not present

## 2012-04-25 DIAGNOSIS — L97509 Non-pressure chronic ulcer of other part of unspecified foot with unspecified severity: Secondary | ICD-10-CM | POA: Diagnosis not present

## 2012-04-25 DIAGNOSIS — IMO0001 Reserved for inherently not codable concepts without codable children: Secondary | ICD-10-CM | POA: Diagnosis not present

## 2012-04-28 DIAGNOSIS — L97509 Non-pressure chronic ulcer of other part of unspecified foot with unspecified severity: Secondary | ICD-10-CM | POA: Diagnosis not present

## 2012-04-29 DIAGNOSIS — L03039 Cellulitis of unspecified toe: Secondary | ICD-10-CM | POA: Diagnosis not present

## 2012-04-29 DIAGNOSIS — I739 Peripheral vascular disease, unspecified: Secondary | ICD-10-CM | POA: Diagnosis not present

## 2012-04-29 DIAGNOSIS — L97509 Non-pressure chronic ulcer of other part of unspecified foot with unspecified severity: Secondary | ICD-10-CM | POA: Diagnosis not present

## 2012-04-29 DIAGNOSIS — IMO0001 Reserved for inherently not codable concepts without codable children: Secondary | ICD-10-CM | POA: Diagnosis not present

## 2012-04-29 DIAGNOSIS — E1149 Type 2 diabetes mellitus with other diabetic neurological complication: Secondary | ICD-10-CM | POA: Diagnosis not present

## 2012-05-03 ENCOUNTER — Telehealth: Payer: Self-pay | Admitting: Cardiology

## 2012-05-03 ENCOUNTER — Other Ambulatory Visit: Payer: Self-pay | Admitting: Cardiology

## 2012-05-03 MED ORDER — FUROSEMIDE 40 MG PO TABS: 40.0000 mg | ORAL_TABLET | Freq: Two times a day (BID) | ORAL | Status: DC

## 2012-05-03 NOTE — Telephone Encounter (Signed)
Confirmed with pharmacy that pt takes lasix 40 mg bid; verfied that they give her a qty of 60 .

## 2012-05-04 DIAGNOSIS — I739 Peripheral vascular disease, unspecified: Secondary | ICD-10-CM | POA: Diagnosis not present

## 2012-05-04 DIAGNOSIS — IMO0001 Reserved for inherently not codable concepts without codable children: Secondary | ICD-10-CM | POA: Diagnosis not present

## 2012-05-04 DIAGNOSIS — L02619 Cutaneous abscess of unspecified foot: Secondary | ICD-10-CM | POA: Diagnosis not present

## 2012-05-04 DIAGNOSIS — E1149 Type 2 diabetes mellitus with other diabetic neurological complication: Secondary | ICD-10-CM | POA: Diagnosis not present

## 2012-05-04 DIAGNOSIS — L97509 Non-pressure chronic ulcer of other part of unspecified foot with unspecified severity: Secondary | ICD-10-CM | POA: Diagnosis not present

## 2012-05-12 DIAGNOSIS — E1149 Type 2 diabetes mellitus with other diabetic neurological complication: Secondary | ICD-10-CM | POA: Diagnosis not present

## 2012-05-12 DIAGNOSIS — L02619 Cutaneous abscess of unspecified foot: Secondary | ICD-10-CM | POA: Diagnosis not present

## 2012-05-12 DIAGNOSIS — L97509 Non-pressure chronic ulcer of other part of unspecified foot with unspecified severity: Secondary | ICD-10-CM | POA: Diagnosis not present

## 2012-05-12 DIAGNOSIS — IMO0001 Reserved for inherently not codable concepts without codable children: Secondary | ICD-10-CM | POA: Diagnosis not present

## 2012-05-12 DIAGNOSIS — I739 Peripheral vascular disease, unspecified: Secondary | ICD-10-CM | POA: Diagnosis not present

## 2012-05-12 DIAGNOSIS — L03039 Cellulitis of unspecified toe: Secondary | ICD-10-CM | POA: Diagnosis not present

## 2012-06-09 DIAGNOSIS — L97509 Non-pressure chronic ulcer of other part of unspecified foot with unspecified severity: Secondary | ICD-10-CM | POA: Diagnosis not present

## 2012-07-07 DIAGNOSIS — L851 Acquired keratosis [keratoderma] palmaris et plantaris: Secondary | ICD-10-CM | POA: Diagnosis not present

## 2012-07-07 DIAGNOSIS — E1149 Type 2 diabetes mellitus with other diabetic neurological complication: Secondary | ICD-10-CM | POA: Diagnosis not present

## 2012-07-07 DIAGNOSIS — B351 Tinea unguium: Secondary | ICD-10-CM | POA: Diagnosis not present

## 2012-07-12 DIAGNOSIS — E039 Hypothyroidism, unspecified: Secondary | ICD-10-CM | POA: Diagnosis not present

## 2012-07-12 DIAGNOSIS — E785 Hyperlipidemia, unspecified: Secondary | ICD-10-CM | POA: Diagnosis not present

## 2012-07-22 ENCOUNTER — Other Ambulatory Visit: Payer: Self-pay | Admitting: Family Medicine

## 2012-07-25 NOTE — Telephone Encounter (Signed)
Ok with 5 refills. May fill monthly

## 2012-07-25 NOTE — Telephone Encounter (Signed)
Wait for md approval

## 2012-07-25 NOTE — Telephone Encounter (Signed)
Ok with five monthly refills

## 2012-07-25 NOTE — Telephone Encounter (Signed)
Request for xanax 1mg , last seen 12/29/11 for chronic visit.

## 2012-07-27 ENCOUNTER — Ambulatory Visit (INDEPENDENT_AMBULATORY_CARE_PROVIDER_SITE_OTHER): Payer: Medicare Other | Admitting: Cardiology

## 2012-07-27 ENCOUNTER — Encounter: Payer: Self-pay | Admitting: Cardiology

## 2012-07-27 VITALS — BP 104/60 | HR 72 | Ht 67.0 in | Wt 253.1 lb

## 2012-07-27 DIAGNOSIS — I10 Essential (primary) hypertension: Secondary | ICD-10-CM

## 2012-07-27 DIAGNOSIS — E785 Hyperlipidemia, unspecified: Secondary | ICD-10-CM | POA: Diagnosis not present

## 2012-07-27 DIAGNOSIS — I2510 Atherosclerotic heart disease of native coronary artery without angina pectoris: Secondary | ICD-10-CM

## 2012-07-27 DIAGNOSIS — I429 Cardiomyopathy, unspecified: Secondary | ICD-10-CM

## 2012-07-27 NOTE — Assessment & Plan Note (Signed)
Status post biventricular ICD, LVEF approximately 35%. She is clinically stable at this time, no change in weight, no edema. Continue current regimen.

## 2012-07-27 NOTE — Assessment & Plan Note (Signed)
She continues on statin therapy, followed by Dr. Gerda Diss.

## 2012-07-27 NOTE — Patient Instructions (Addendum)
Your physician recommends that you schedule a follow-up appointment in: 6 months  

## 2012-07-27 NOTE — Progress Notes (Signed)
Clinical Summary [name] is a 67 y.o.female last seen by Ms. Lawrence NP in June 2013. Interval visit with Dr. Ladona [name] noted in November 2013.  Echocardiogram from February 2013 revealed moderately dilated LV with mild LVH, LVEF 35%, very mild aortic stenosis, moderate left atrial enlargement, MAC. Her weight is stable compared to previous visit. She reports NYHA class II dyspnea, no exertional chest pain, no device discharges. She states that she has been compliant with her medications.  ECG today shows ventricular paced rhythm with atrial tracking.  States that she tries to stay active, does some volunteering, also ADLs and housework.  No Known Allergies  Current Outpatient Prescriptions  Medication Sig Dispense Refill  . ALPRAZolam (XANAX) 1 MG tablet Take 1 mg by mouth at bedtime as needed. Pt said she only takes one or two weekly at bedtime (anxiety)      . aspirin EC 81 MG tablet Take 81 mg by mouth daily.      . carvedilol (COREG) 25 MG tablet Take 1 tablet (25 mg total) by mouth 2 (two) times daily with a meal.  60 tablet  10  . enalapril (VASOTEC) 10 MG tablet Take 1 tablet (10 mg total) by mouth 2 (two) times daily.  60 tablet  10  . fenofibrate 160 MG tablet Take 160 mg by mouth at bedtime.        . furosemide (LASIX) 40 MG tablet Take 1 tablet (40 mg total) by mouth 2 (two) times daily.  30 tablet  6  . insulin aspart (NOVOLOG) 100 UNIT/ML injection Inject 15-40 Units into the skin 3 (three) times daily before meals. Sliding Scale      . insulin glargine (LANTUS) 100 UNIT/ML injection Inject 70 Units into the skin 2 (two) times daily.       Marland Kitchen levothyroxine (SYNTHROID, LEVOTHROID) 100 MCG tablet Take 100 mcg by mouth daily.        . Liraglutide (VICTOZA [name] [name]) Inject 1.8 Units into the skin as directed.       . multivitamin (THERAGRAN) per tablet Take 1 tablet by mouth daily.        . Omega-3 Fatty Acids (FISH OIL) 1000 MG CAPS Take 1,000 mg by mouth daily.      . potassium  chloride SA (K-DUR,KLOR-CON) 20 MEQ tablet Take 20 mEq by mouth 2 (two) times daily.      . simvastatin (ZOCOR) 80 MG tablet Take 1 tablet (80 mg total) by mouth at bedtime.  30 tablet  0  . vitamin E 600 UNIT capsule Take 600 Units by mouth daily.         No current facility-administered medications for this visit.    Past Medical History  Diagnosis Date  . Chronic systolic heart failure   . Diabetes mellitus, type 2   . Hyperlipidemia   . Essential hypertension, benign   . Hypothyroidism   . PVD (peripheral vascular disease)   . Coronary atherosclerosis of native coronary artery     Multivessel, patent grafts 2007  . Ischemic cardiomyopathy     Ejection Fraction of 25% improved to 45-50% following biventicular pacing, Status post biventricular dual-chamber ICD - [employer]  . Hypothyroidism   . Cardiac defibrillator in place   . Colon polyps     20 yrs ago    Past Surgical History  Procedure Laterality Date  . Coronary artery bypass graft  2001    [facility] - LIMA to LAD, SVG to ramus, SVG  to OM/PLB, SVG to RCA   . Appendectomy    . Bilateral tubal ligation    . Cardiac defibrillator placement      [employer] [facility]  . Flexible sigmoidoscopy  07/19/1998    Dr. Karilyn Cota  . Tonsillectomy    . Cesarean section      Social History [name] reports that she quit smoking about 20 years ago. She has never used smokeless tobacco. [name] reports that she does not drink alcohol.  Review of Systems Had no palpitations or syncope. Occasionally has belching episodes with epigastric discomfort after meals. No melena or hematochezia. Otherwise negative.  Physical Examination Filed Vitals:   07/27/12 0805  BP: 104/60  Pulse: 72   Filed Weights   07/27/12 0805  Weight: 253 lb 1.9 oz (114.814 kg)    Obese woman in NAD.  HEENT: Conjunctiva and lids normal, oropharynx clear with moist mucosa.  Neck: Supple, increased girth, no carotid bruits, no  thyromegaly.  Lungs: Clear to auscultation, nonlabored breathing at rest.  Cardiac: Regular rate and rhythm, no S3, no pericardial rub.  Abdomen: Soft, nontender, bowel sounds present, no guarding or rebound.  Extremities: No pitting edema, distal pulses 2+.  Skin: Warm and dry.  Musculoskeletal: No kyphosis.  Neuropsychiatric: Alert and oriented x3, affect grossly appropriate.   Problem List and Plan   Coronary atherosclerosis of native coronary artery No active angina symptoms. Plan to continue medical therapy and observation.  Secondary cardiomyopathy, unspecified Status post biventricular ICD, LVEF approximately 35%. She is clinically stable at this time, no change in weight, no edema. Continue current regimen.  Essential hypertension, benign Blood pressure very well controlled today.  HYPERLIPIDEMIA She continues on statin therapy, followed by Dr. Gerda [name].    [name] [name], M.D., F.A.C.C.

## 2012-07-27 NOTE — Assessment & Plan Note (Signed)
No active angina symptoms. Plan to continue medical therapy and observation. 

## 2012-07-27 NOTE — Assessment & Plan Note (Signed)
Blood pressure very well controlled today. 

## 2012-07-28 DIAGNOSIS — E669 Obesity, unspecified: Secondary | ICD-10-CM | POA: Diagnosis not present

## 2012-07-28 DIAGNOSIS — E890 Postprocedural hypothyroidism: Secondary | ICD-10-CM | POA: Diagnosis not present

## 2012-07-28 DIAGNOSIS — I10 Essential (primary) hypertension: Secondary | ICD-10-CM | POA: Diagnosis not present

## 2012-07-28 DIAGNOSIS — E785 Hyperlipidemia, unspecified: Secondary | ICD-10-CM | POA: Diagnosis not present

## 2012-07-29 ENCOUNTER — Encounter: Payer: Self-pay | Admitting: *Deleted

## 2012-08-02 ENCOUNTER — Ambulatory Visit (INDEPENDENT_AMBULATORY_CARE_PROVIDER_SITE_OTHER): Payer: Medicare Other | Admitting: Family Medicine

## 2012-08-02 ENCOUNTER — Encounter: Payer: Self-pay | Admitting: Family Medicine

## 2012-08-02 VITALS — BP 132/67 | Ht 67.0 in | Wt 252.3 lb

## 2012-08-02 DIAGNOSIS — E119 Type 2 diabetes mellitus without complications: Secondary | ICD-10-CM | POA: Diagnosis not present

## 2012-08-02 DIAGNOSIS — I2510 Atherosclerotic heart disease of native coronary artery without angina pectoris: Secondary | ICD-10-CM | POA: Diagnosis not present

## 2012-08-02 DIAGNOSIS — E785 Hyperlipidemia, unspecified: Secondary | ICD-10-CM

## 2012-08-02 DIAGNOSIS — I10 Essential (primary) hypertension: Secondary | ICD-10-CM | POA: Diagnosis not present

## 2012-08-02 MED ORDER — PNEUMOCOCCAL VAC POLYVALENT 25 MCG/0.5ML IJ INJ
0.5000 mL | INJECTION | Freq: Once | INTRAMUSCULAR | Status: AC
  Administered 2012-08-02: 0.5 mL via INTRAMUSCULAR

## 2012-08-02 NOTE — Progress Notes (Signed)
  Subjective:    Patient ID: [name] [name], female    DOB: 02-10-1946, 67 y.o.   MRN: [id number]  Diabetes Pertinent negatives for diabetes include no chest pain.  Hypertension The current episode started more than 1 year ago. The problem is unchanged. The problem is uncontrolled. Associated symptoms include malaise/fatigue. Pertinent negatives include no anxiety or chest pain. There are no associated agents to hypertension. Risk factors for coronary artery disease include diabetes mellitus, dyslipidemia and sedentary lifestyle. Past treatments include ACE inhibitors and beta blockers.   In an in patient notes chronic back pain. Primarily musculoskeletal. Patient on her medication for hyperlipidemia. Claims compliance. Trying to watch her diet. Not exercising much. Known history of coronary artery disease. See his cardiologist regularly.   Review of Systems  Constitutional: Positive for malaise/fatigue.  Cardiovascular: Negative for chest pain.   ROS otherwise negative.     Objective:   Physical Exam  Alert significant obesity noted. Vitals reviewed. HEENT normal. Lungs clear. Heart regular in rhythm. Abdomen soft. Ankles trace edema. Diminished sensation in the feet diffusely. Status post partial amputation of one toe.      Assessment & Plan:  Impression #1 hyperlipidemia control decent discussed with patient. #2 hypertension good control. #3 chronic intermittent back pain discussed. #4 history of CHF clinically stable. Plan diet exercise discussed. Medications refilled. Maintain visits with specialist. Pain medicines written. WSL

## 2012-08-02 NOTE — Patient Instructions (Addendum)
Keep exercising and watch your diet.

## 2012-09-07 ENCOUNTER — Encounter: Payer: Medicare Other | Admitting: Nurse Practitioner

## 2012-09-09 ENCOUNTER — Other Ambulatory Visit: Payer: Self-pay | Admitting: Family Medicine

## 2012-09-09 ENCOUNTER — Other Ambulatory Visit: Payer: Self-pay | Admitting: *Deleted

## 2012-09-09 MED ORDER — SIMVASTATIN 80 MG PO TABS: 80.0000 mg | ORAL_TABLET | Freq: Every day | ORAL | Status: DC

## 2012-09-12 ENCOUNTER — Encounter: Payer: Self-pay | Admitting: Internal Medicine

## 2012-09-12 ENCOUNTER — Ambulatory Visit (INDEPENDENT_AMBULATORY_CARE_PROVIDER_SITE_OTHER): Payer: Medicare Other | Admitting: Internal Medicine

## 2012-09-12 ENCOUNTER — Encounter: Payer: Medicare Other | Admitting: Internal Medicine

## 2012-09-12 VITALS — BP 132/76 | HR 69 | Wt 255.0 lb

## 2012-09-12 DIAGNOSIS — I10 Essential (primary) hypertension: Secondary | ICD-10-CM | POA: Diagnosis not present

## 2012-09-12 DIAGNOSIS — Z95810 Presence of automatic (implantable) cardiac defibrillator: Secondary | ICD-10-CM | POA: Diagnosis not present

## 2012-09-12 DIAGNOSIS — I4891 Unspecified atrial fibrillation: Secondary | ICD-10-CM | POA: Diagnosis not present

## 2012-09-12 DIAGNOSIS — I482 Chronic atrial fibrillation, unspecified: Secondary | ICD-10-CM | POA: Insufficient documentation

## 2012-09-12 DIAGNOSIS — I429 Cardiomyopathy, unspecified: Secondary | ICD-10-CM

## 2012-09-12 LAB — ICD DEVICE OBSERVATION
AL AMPLITUDE: 0.3 mv
AL IMPEDENCE ICD: 479 Ohm
LV LEAD IMPEDENCE ICD: 680 Ohm
LV LEAD THRESHOLD: 1.2 V
RV LEAD THRESHOLD: 1.2 V
TZON-0003FASTVT: 300 ms

## 2012-09-12 LAB — BASIC METABOLIC PANEL
BUN: 15 mg/dL (ref 6–23)
Calcium: 8.9 mg/dL (ref 8.4–10.5)
Creat: 0.78 mg/dL (ref 0.50–1.10)
Glucose, Bld: 199 mg/dL — ABNORMAL HIGH (ref 70–99)
Potassium: 4.7 mEq/L (ref 3.5–5.3)

## 2012-09-12 LAB — T4, FREE: Free T4: 1 ng/dL (ref 0.80–1.80)

## 2012-09-12 MED ORDER — APIXABAN 5 MG PO TABS: 5.0000 mg | ORAL_TABLET | Freq: Two times a day (BID) | ORAL | Status: DC

## 2012-09-12 NOTE — Assessment & Plan Note (Signed)
This is a new problem. I have discussed the treatment in detail. She is an acceptable candidate for anti-coagulation and after discussing the pro's and cons of coumadin and Eliquis, she wishes to try the latter. She has transportation difficulty and would prefer not to have to come in to get her INR checked. Will check labs to confirm her renal function is ok and no thyroid dysfunction. Also, because of her underlying CHB, I would not pursue rhythm control as she continue to BiV pace 96%.

## 2012-09-12 NOTE — Assessment & Plan Note (Signed)
Her blood pressure is

## 2012-09-12 NOTE — Progress Notes (Signed)
HPI [name] [name] returns today for followup. She is a pleasant 67 yo woman with a h/o ICM, chronic systolic CHF, LBBB, and now high grade heart block,  s/p ICD implant. In the interim, She feels well. She denies chest pain, sob, or syncope. No ICD shocks. No palpitations. She has been frustrated by her inability to lose weight. Allergies  Allergen Reactions  . Celebrex (Celecoxib)   . Cilostazol      Current Outpatient Prescriptions  Medication Sig Dispense Refill  . ALPRAZolam (XANAX) 1 MG tablet Take 1 mg by mouth at bedtime as needed. Pt said she only takes one or two weekly at bedtime (anxiety)      . aspirin EC 81 MG tablet Take 81 mg by mouth daily.      . carvedilol (COREG) 25 MG tablet Take 1 tablet (25 mg total) by mouth 2 (two) times daily with a meal.  60 tablet  10  . enalapril (VASOTEC) 10 MG tablet Take 1 tablet (10 mg total) by mouth 2 (two) times daily.  60 tablet  10  . fenofibrate 160 MG tablet TAKE ONE TABLET DAILY AT BEDTIME  30 tablet  1  . furosemide (LASIX) 40 MG tablet Take 1 tablet (40 mg total) by mouth 2 (two) times daily.  30 tablet  6  . insulin aspart (NOVOLOG) 100 UNIT/ML injection Inject 15-40 Units into the skin 3 (three) times daily before meals. Sliding Scale      . insulin glargine (LANTUS) 100 UNIT/ML injection Inject 70 Units into the skin 2 (two) times daily.       Marland Kitchen levothyroxine (SYNTHROID, LEVOTHROID) 100 MCG tablet Take 100 mcg by mouth daily.        . Liraglutide (VICTOZA [place]) Inject 1.8 Units into the skin as directed.       . multivitamin (THERAGRAN) per tablet Take 1 tablet by mouth daily.        . Omega-3 Fatty Acids (FISH OIL) 1000 MG CAPS Take 1,000 mg by mouth daily.      . potassium chloride SA (K-DUR,KLOR-CON) 20 MEQ tablet Take 20 mEq by mouth 2 (two) times daily.      . simvastatin (ZOCOR) 80 MG tablet Take 1 tablet (80 mg total) by mouth at bedtime.  30 tablet  0  . vitamin E 600 UNIT capsule Take 600 Units by mouth daily.         No  current facility-administered medications for this visit.     Past Medical History  Diagnosis Date  . Chronic systolic heart failure   . Diabetes mellitus, type 2   . Hyperlipidemia   . Essential hypertension, benign   . Hypothyroidism   . PVD (peripheral vascular disease)   . Coronary atherosclerosis of native coronary artery     Multivessel, patent grafts 2007  . Ischemic cardiomyopathy     Ejection Fraction of 25% improved to 45-50% following biventicular pacing, Status post biventricular dual-chamber ICD - [employer]  . Hypothyroidism   . Cardiac defibrillator in place   . Colon polyps     20 yrs ago  . Ulcer     ROS:   All systems reviewed and negative except as noted in the HPI.   Past Surgical History  Procedure Laterality Date  . Coronary artery bypass graft  2001    [facility] - LIMA to LAD, SVG to ramus, SVG to OM/PLB, SVG to RCA   . Appendectomy    . Bilateral tubal  ligation    . Cardiac defibrillator placement      [employer]  . Flexible sigmoidoscopy  07/19/1998    Dr. Karilyn Cota  . Tonsillectomy    . Cesarean section       Family History  Problem Relation Age of Onset  . Diabetes Mother   . Diabetes Sister   . Diabetes Father   . Heart attack Father   . Coronary artery disease Sister 79  . Ulcers Mother      History   Social History  . Marital Status: Divorced    Spouse Name: N/A    Number of Children: 1  . Years of Education: N/A   Occupational History  . Full Time: [employer]   . Disabled   .     Social History Main Topics  . Smoking status: Former Smoker    Quit date: 09/16/1991  . Smokeless tobacco: Never Used  . Alcohol Use: No  . Drug Use: No  . Sexually Active: No   Other Topics Concern  . Not on file   Social History Narrative  . No narrative on file     BP - 132/76, P - 69, weight 255 lbs  Physical Exam:  Obese appearing 67 yo woman NAD HEENT: Unremarkable Neck:  No JVD,  no thyromegally, I do not appreciate a gouter Back:  No CVA tenderness Lungs:  Clear with no wheezes. HEART:  Regular rate rhythm, no murmurs, no rubs, no clicks Abd:  Soft,obese,  positive bowel sounds, no organomegally, no rebound, no guarding Ext:  2 plus pulses, no edema, no cyanosis, no clubbing Skin:  No rashes no nodules Neuro:  CN II through XII intact, motor grossly intact   DEVICE  Normal device function.  See PaceArt for details. Patient has reverted to atrial fibrillation.  Assess/Plan:

## 2012-09-12 NOTE — Patient Instructions (Addendum)
Your physician has recommended you make the following change in your medication: START Eliquis 5mg  take one by mouth twice a day, STOP Aspirin  Your physician recommends that you have lab work today: TSH, Free T4 and BMP  Your physician recommends that you schedule a follow-up appointment in: 6 WEEKS with Misty Stanley for Anticoagulation meeting since starting Eliquis--pt will need BMP and CBC per Dr Ladona Ridgel  Your physician recommends that you schedule a follow-up appointment in: 3 MONTHS with Gunnar Fusi for device check  Your physician wants you to follow-up in: 1 YEAR with Dr Ladona Ridgel. You will receive a reminder letter in the mail two months in advance. If you don't receive a letter, please call our office to schedule the follow-up appointment.

## 2012-09-12 NOTE — Assessment & Plan Note (Signed)
Her [employer] device is working normally. Will recheck in several months.

## 2012-10-07 DIAGNOSIS — E11359 Type 2 diabetes mellitus with proliferative diabetic retinopathy without macular edema: Secondary | ICD-10-CM | POA: Diagnosis not present

## 2012-10-07 DIAGNOSIS — E11311 Type 2 diabetes mellitus with unspecified diabetic retinopathy with macular edema: Secondary | ICD-10-CM | POA: Diagnosis not present

## 2012-10-11 ENCOUNTER — Other Ambulatory Visit: Payer: Self-pay | Admitting: *Deleted

## 2012-10-11 MED ORDER — SIMVASTATIN 80 MG PO TABS: 80.0000 mg | ORAL_TABLET | Freq: Every day | ORAL | Status: DC

## 2012-11-01 DIAGNOSIS — E559 Vitamin D deficiency, unspecified: Secondary | ICD-10-CM | POA: Diagnosis not present

## 2012-11-01 DIAGNOSIS — E039 Hypothyroidism, unspecified: Secondary | ICD-10-CM | POA: Diagnosis not present

## 2012-11-08 DIAGNOSIS — L97509 Non-pressure chronic ulcer of other part of unspecified foot with unspecified severity: Secondary | ICD-10-CM | POA: Diagnosis not present

## 2012-11-08 DIAGNOSIS — I10 Essential (primary) hypertension: Secondary | ICD-10-CM | POA: Diagnosis not present

## 2012-11-08 DIAGNOSIS — E039 Hypothyroidism, unspecified: Secondary | ICD-10-CM | POA: Diagnosis not present

## 2012-11-08 DIAGNOSIS — E785 Hyperlipidemia, unspecified: Secondary | ICD-10-CM | POA: Diagnosis not present

## 2012-11-08 DIAGNOSIS — E042 Nontoxic multinodular goiter: Secondary | ICD-10-CM | POA: Diagnosis not present

## 2012-11-09 ENCOUNTER — Ambulatory Visit (INDEPENDENT_AMBULATORY_CARE_PROVIDER_SITE_OTHER): Payer: Medicare Other | Admitting: *Deleted

## 2012-11-09 DIAGNOSIS — I4891 Unspecified atrial fibrillation: Secondary | ICD-10-CM | POA: Diagnosis not present

## 2012-11-17 DIAGNOSIS — L97509 Non-pressure chronic ulcer of other part of unspecified foot with unspecified severity: Secondary | ICD-10-CM | POA: Diagnosis not present

## 2012-11-19 ENCOUNTER — Other Ambulatory Visit: Payer: Self-pay | Admitting: Family Medicine

## 2012-11-22 ENCOUNTER — Other Ambulatory Visit: Payer: Self-pay | Admitting: Cardiology

## 2012-11-22 MED ORDER — ENALAPRIL MALEATE 10 MG PO TABS: 10.0000 mg | ORAL_TABLET | Freq: Two times a day (BID) | ORAL | Status: DC

## 2012-11-22 MED ORDER — CARVEDILOL 25 MG PO TABS: 25.0000 mg | ORAL_TABLET | Freq: Two times a day (BID) | ORAL | Status: DC

## 2012-11-22 MED ORDER — POTASSIUM CHLORIDE CRYS ER 20 MEQ PO TBCR: 20.0000 meq | EXTENDED_RELEASE_TABLET | Freq: Two times a day (BID) | ORAL | Status: DC

## 2012-11-22 MED ORDER — SIMVASTATIN 80 MG PO TABS: 80.0000 mg | ORAL_TABLET | Freq: Every day | ORAL | Status: DC

## 2012-12-01 DIAGNOSIS — L97509 Non-pressure chronic ulcer of other part of unspecified foot with unspecified severity: Secondary | ICD-10-CM | POA: Diagnosis not present

## 2012-12-13 ENCOUNTER — Other Ambulatory Visit: Payer: Self-pay | Admitting: Internal Medicine

## 2012-12-13 DIAGNOSIS — I4891 Unspecified atrial fibrillation: Secondary | ICD-10-CM | POA: Diagnosis not present

## 2012-12-13 LAB — CBC
Hemoglobin: 11.7 g/dL — ABNORMAL LOW (ref 12.0–15.0)
MCH: 29.5 pg (ref 26.0–34.0)
MCHC: 33.1 g/dL (ref 30.0–36.0)
RDW: 13.5 % (ref 11.5–15.5)

## 2012-12-16 ENCOUNTER — Telehealth: Payer: Self-pay | Admitting: *Deleted

## 2012-12-16 NOTE — Telephone Encounter (Signed)
Pt decided to stay with the Eliquis and will call the office if she decides to change, pt made aware she needs to stay on the Eliquis to reduce her risk of stroke, pt understood and will Pt will call back to our office with any further concerns if necessary

## 2012-12-16 NOTE — Telephone Encounter (Signed)
Patient states that she has started taking Eliquis and it is giving her headaches so she does not want to take it.  tgs

## 2012-12-16 NOTE — Telephone Encounter (Signed)
Called pt to confirm current signs and symptoms, pt noted she started Eliquis in June and noted the headaches, pt stopped taking on her own in July and was advised by coumadin nurse to re-start per noted protected against strokes, pt started taking again last week, denied headache but more like a feeling of fatigue and lack of energy all day every day however this has gotten better, pt was concerned per when she first started last week she had a feeling of a no energy at all and BP was noted 138/66 HR 68 but pt also notes she is scared of all the side effects of this medications, pt also concerned because when she visited her Dr, Johnnette Litter (retinopothy specialist) in may she noted her eyes looked wonderful with no weak spots or bleeds and really pleased per also noted CBG are down however after she started taking the eliquis she noticed floaters for a few days in her eyes, this has stopped the past 2 days, pt denies chest pain/SOB/swelling/headaches/dizzyness BP 120/70's range, pt advised she had the very same sxs when she was allergic to another medication before, pt could not clarify if the medication was the celebrex listed or not, please advise

## 2012-12-16 NOTE — Telephone Encounter (Signed)
It is important that she take the Eliquis as directed. Alternative would be to take coumadin.There is a note from Dr.Taylor that documents his discussion with her about both Eliquis and coumadin. She chose Eliquis. If she cannot tolerate, she will need to start coumadin. She will have to decide which she would like to use. If she chooses coumadin, please make an appt with our coumadin clinic but stay on Eliquis until she switches.

## 2012-12-22 ENCOUNTER — Encounter (HOSPITAL_BASED_OUTPATIENT_CLINIC_OR_DEPARTMENT_OTHER): Payer: Medicare Other | Attending: Internal Medicine

## 2012-12-22 DIAGNOSIS — L97509 Non-pressure chronic ulcer of other part of unspecified foot with unspecified severity: Secondary | ICD-10-CM | POA: Insufficient documentation

## 2012-12-22 DIAGNOSIS — Z794 Long term (current) use of insulin: Secondary | ICD-10-CM | POA: Diagnosis not present

## 2012-12-22 DIAGNOSIS — E1169 Type 2 diabetes mellitus with other specified complication: Secondary | ICD-10-CM | POA: Diagnosis not present

## 2012-12-22 DIAGNOSIS — E039 Hypothyroidism, unspecified: Secondary | ICD-10-CM | POA: Diagnosis not present

## 2012-12-22 DIAGNOSIS — I10 Essential (primary) hypertension: Secondary | ICD-10-CM | POA: Insufficient documentation

## 2012-12-23 ENCOUNTER — Other Ambulatory Visit: Payer: Self-pay | Admitting: Family Medicine

## 2012-12-23 NOTE — Progress Notes (Signed)
[facility]  NAME:  [name] [name], IMHOFF NO.:  [id number]  MEDICAL RECORD NO.:  [id number]      DATE OF BIRTH:  [birth year]  PHYSICIAN:  Maxwell Caul, M.D. VISIT DATE:  12/22/2012                                  OFFICE VISIT   LOCATION:  [facility].  HISTORY:  [name] is a 67 year old, type 2 diabetic, on insulin, who arrived for our review of a wound on her left lateral foot just at the base of her left fifth toe.  The history that the patient gives is that she has had a left second toe amputation in late December 2013. She describes a wrapped that was put on too tight with the wounds on her feet.  The progression of this is really not clear; however, she developed a callus in the area of the current wound.  This eventually opened.  She has been watching this with an aseptic solution and applying Silvadene cream for several months without real improvement. She is here for our review.  She is in a [place] sandal.  PAST MEDICAL HISTORY: 1. Type 2 diabetes. 2. Hypothyroidism. 3. Congestive heart failure. 4. Hypertension.  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  MEDICATIONS: 1. Victoza 1.8 daily. 2. Lantus 70 units at night.  She is on sliding scale. 3. She takes fish oil a 1000 daily. 4. Carvedilol 50 twice a day. 5. Synthroid 100 daily. 6. Lasix 40 twice a day. 7. K-Dur 20 twice a day. 8. Simvastatin 80 daily. 9. Enalapril twice daily.  PHYSICAL EXAMINATION:  The patient's temperature was 97.9, pulse 70, respirations 18, blood pressure 157/70.  Her CBG was 159.  Vascular assessment in this clinic revealed an ABI that was not able to be calculated.  She did have absent vibration sense.  The wound was over the left lateral foot just at the base of left fifth toe.  This measured 0.4 x 0.3 x 0.3.  There was undermining of 0.4 cm right around the circumferential wound.  All of the undermining was opened up  using a #15 blade.  We used direct pressure and silver nitrate to achieve hemostasis.  After debridement, the base of the wound appeared healthy.  There was no evidence of soft tissue infection.  No cultures were done and no antibiotics were prescribed.  IMPRESSION:  Wegner's grade 2 wounds at the base of left fifth toe lateral aspect.  We dressed it with Santyl, Hydrogel, and foam based dressing.  We will leave this in place and see her in a week's time.          ______________________________ Maxwell Caul, M.D.     MGR/MEDQ  D:  12/22/2012  T:  12/23/2012  Job:  [id number]

## 2012-12-29 DIAGNOSIS — I10 Essential (primary) hypertension: Secondary | ICD-10-CM | POA: Diagnosis not present

## 2012-12-29 DIAGNOSIS — L97509 Non-pressure chronic ulcer of other part of unspecified foot with unspecified severity: Secondary | ICD-10-CM | POA: Diagnosis not present

## 2012-12-29 DIAGNOSIS — E039 Hypothyroidism, unspecified: Secondary | ICD-10-CM | POA: Diagnosis not present

## 2012-12-29 DIAGNOSIS — E1169 Type 2 diabetes mellitus with other specified complication: Secondary | ICD-10-CM | POA: Diagnosis not present

## 2013-01-02 ENCOUNTER — Other Ambulatory Visit (HOSPITAL_BASED_OUTPATIENT_CLINIC_OR_DEPARTMENT_OTHER): Payer: Self-pay | Admitting: Internal Medicine

## 2013-01-02 ENCOUNTER — Ambulatory Visit (HOSPITAL_COMMUNITY)
Admission: RE | Admit: 2013-01-02 | Discharge: 2013-01-02 | Disposition: A | Discharge status: Home / Self Care | Payer: Medicare Other | Source: Ambulatory Visit | Attending: Internal Medicine | Admitting: Internal Medicine

## 2013-01-02 DIAGNOSIS — R937 Abnormal findings on diagnostic imaging of other parts of musculoskeletal system: Secondary | ICD-10-CM | POA: Diagnosis not present

## 2013-01-02 DIAGNOSIS — M869 Osteomyelitis, unspecified: Secondary | ICD-10-CM

## 2013-01-02 DIAGNOSIS — L97509 Non-pressure chronic ulcer of other part of unspecified foot with unspecified severity: Secondary | ICD-10-CM | POA: Diagnosis not present

## 2013-01-02 DIAGNOSIS — S92309A Fracture of unspecified metatarsal bone(s), unspecified foot, initial encounter for closed fracture: Secondary | ICD-10-CM | POA: Diagnosis not present

## 2013-01-02 NOTE — Patient Instructions (Signed)
Pt in today for Eliquis F/U.  She was started on Eliquis 5mg  BID by Dr Ladona Ridgel for Atrial Fib. Pt denies complaints since starting Eliquis..  No bleeding, increased bruising or GI upset.   Reviewed patients medication list.  Pt is not currently on any combined P-gp and strong CYP3A4 inhibitors/inducers (ketoconazole, traconazole, ritonavir, carbamazepine, phenytoin, rifampin, St. John's wort).  Reviewed labs from 11/01/12  SCr 0.97, Weight 11/09/12  255, CrCl- 105.58.  Dose  Appropriate based on SrCrl.   Hgb and HCT  11.7/35.3  A full discussion of the nature of anticoagulants has been carried out.  A benefit/risk analysis has been presented to the patient, so that they understand the justification for choosing anticoagulation with Eliquis at this time.  The need for compliance is stressed.  Pt is aware to take the medication twice daily.  Side effects of potential bleeding are discussed, including unusual colored urine or stools, coughing up blood or coffee ground emesis, nose bleeds or serious fall or head trauma.  Discussed signs and symptoms of stroke. The patient should avoid any OTC items containing aspirin or ibuprofen.  Avoid alcohol consumption.   Call if any signs of abnormal bleeding.  Discussed financial obligations and resolved any difficulty in obtaining medication.  Next lab test test in 3-4 months.

## 2013-01-03 ENCOUNTER — Ambulatory Visit (INDEPENDENT_AMBULATORY_CARE_PROVIDER_SITE_OTHER): Payer: Medicare Other | Admitting: *Deleted

## 2013-01-03 DIAGNOSIS — I4891 Unspecified atrial fibrillation: Secondary | ICD-10-CM | POA: Diagnosis not present

## 2013-01-03 DIAGNOSIS — I429 Cardiomyopathy, unspecified: Secondary | ICD-10-CM | POA: Diagnosis not present

## 2013-01-03 LAB — ICD DEVICE OBSERVATION
AL IMPEDENCE ICD: 500 Ohm
LV LEAD IMPEDENCE ICD: 767 Ohm
LV LEAD THRESHOLD: 1.4 V
RV LEAD IMPEDENCE ICD: 751 Ohm
TZON-0003SLOWVT: 342.8 ms
VENTRICULAR PACING ICD: 100 pct

## 2013-01-03 NOTE — Progress Notes (Signed)
ICD check in office. 

## 2013-01-05 DIAGNOSIS — E1169 Type 2 diabetes mellitus with other specified complication: Secondary | ICD-10-CM | POA: Diagnosis not present

## 2013-01-05 DIAGNOSIS — I10 Essential (primary) hypertension: Secondary | ICD-10-CM | POA: Diagnosis not present

## 2013-01-05 DIAGNOSIS — L97509 Non-pressure chronic ulcer of other part of unspecified foot with unspecified severity: Secondary | ICD-10-CM | POA: Diagnosis not present

## 2013-01-05 DIAGNOSIS — E039 Hypothyroidism, unspecified: Secondary | ICD-10-CM | POA: Diagnosis not present

## 2013-01-10 ENCOUNTER — Encounter: Payer: Self-pay | Admitting: Infectious Disease

## 2013-01-10 ENCOUNTER — Ambulatory Visit (INDEPENDENT_AMBULATORY_CARE_PROVIDER_SITE_OTHER): Payer: Medicare Other | Admitting: Infectious Disease

## 2013-01-10 ENCOUNTER — Other Ambulatory Visit (HOSPITAL_COMMUNITY): Payer: Self-pay | Admitting: "Endocrinology

## 2013-01-10 VITALS — BP 130/75 | HR 68 | Temp 98.4°F | Ht 67.0 in | Wt 253.0 lb

## 2013-01-10 DIAGNOSIS — Z95810 Presence of automatic (implantable) cardiac defibrillator: Secondary | ICD-10-CM

## 2013-01-10 DIAGNOSIS — L97509 Non-pressure chronic ulcer of other part of unspecified foot with unspecified severity: Secondary | ICD-10-CM

## 2013-01-10 DIAGNOSIS — F329 Major depressive disorder, single episode, unspecified: Secondary | ICD-10-CM | POA: Diagnosis not present

## 2013-01-10 DIAGNOSIS — I5041 Acute combined systolic (congestive) and diastolic (congestive) heart failure: Secondary | ICD-10-CM | POA: Diagnosis not present

## 2013-01-10 DIAGNOSIS — R4589 Other symptoms and signs involving emotional state: Secondary | ICD-10-CM

## 2013-01-10 DIAGNOSIS — E11621 Type 2 diabetes mellitus with foot ulcer: Secondary | ICD-10-CM | POA: Insufficient documentation

## 2013-01-10 DIAGNOSIS — A4901 Methicillin susceptible Staphylococcus aureus infection, unspecified site: Secondary | ICD-10-CM | POA: Diagnosis not present

## 2013-01-10 DIAGNOSIS — M9080 Osteopathy in diseases classified elsewhere, unspecified site: Secondary | ICD-10-CM

## 2013-01-10 DIAGNOSIS — E042 Nontoxic multinodular goiter: Secondary | ICD-10-CM

## 2013-01-10 DIAGNOSIS — Z23 Encounter for immunization: Secondary | ICD-10-CM

## 2013-01-10 DIAGNOSIS — M869 Osteomyelitis, unspecified: Secondary | ICD-10-CM

## 2013-01-10 DIAGNOSIS — I509 Heart failure, unspecified: Secondary | ICD-10-CM

## 2013-01-10 DIAGNOSIS — E1169 Type 2 diabetes mellitus with other specified complication: Secondary | ICD-10-CM

## 2013-01-10 DIAGNOSIS — R454 Irritability and anger: Secondary | ICD-10-CM

## 2013-01-10 LAB — CBC WITH DIFFERENTIAL/PLATELET
Lymphocytes Relative: 30 % (ref 12–46)
Lymphs Abs: 2.7 10*3/uL (ref 0.7–4.0)
MCH: 29.4 pg (ref 26.0–34.0)
MCHC: 33.4 g/dL (ref 30.0–36.0)
MCV: 88 fL (ref 78.0–100.0)
Monocytes Absolute: 0.8 10*3/uL (ref 0.1–1.0)
Monocytes Relative: 9 % (ref 3–12)
Neutro Abs: 5 10*3/uL (ref 1.7–7.7)
Neutrophils Relative %: 57 % (ref 43–77)

## 2013-01-10 NOTE — Patient Instructions (Signed)
Please overbook for 15 minute slot in 1 month

## 2013-01-10 NOTE — Progress Notes (Signed)
Subjective:    Patient ID: [name] [name], female    DOB: May 16, 1945, 67 y.o.   MRN: [id number]  HPI  67 year old Caucasian lady with past medical history significant for diabetes mellitus coronary artery disease status post coronary artery bypass grafting x5 vessels, AF, HTN,  ICD implantation, who had an ulceration override to develop this past December of 2013.-A callused area that she picked at which then subsequently became infected. She underwent amputation of this toe by Dr. Ulice Brilliant. She relates that then as the wound was being tightly dressed that she noticed a month into postoperative care that her left fifth toe seem to be regressing and coming smaller in size. She claims that her podiatrist did not assess this at the time. Ultimately she developed ulceration on the lateral aspect of the fifth metatarsal. This was later debridement by her podiatrist Dr. Ulice Brilliant. She then was referred to Dr. Leanord Hawking with the wound care clinic who is performed debridement and is applying Santyl and wound care. Plain films were obtained of this foot and these revealed:   1. Deformity and sclerosis of the 5th metatarsal, consistent with  subacute fracture and possible chronic osteomyelitis.  2. Periosteal thickening of the 4th metatarsal, consistent with  stress fracture or possible chronic osteomyelitis.  3. Interval amputation of the 2nd digit.   She has been referred to [place] for evaluation and rx of this chronic osteomyelitis. She notes that she was visited by is a staph aureus colonized patient and believes she may have suffered from a staphylococcal infection of her toe nail many years ago that was also treated by Dr. Ulice Brilliant.  I emphasized to her that she would need further surgery to control this infection and this would likely require amputations of the fifth not fourth toes or even potentially more proximal amputations.  Patient was very tearful and upset during the visit and was very upset and angry at her  podiatrist and she blamed for the development of the ulceration adjacent to her fifth metatarsal and for the fracture.    Review of Systems  Constitutional: Negative for fever, chills, diaphoresis, activity change, appetite change, fatigue and unexpected weight change.  HENT: Negative for congestion, sore throat, rhinorrhea, sneezing, trouble swallowing and sinus pressure.   Eyes: Negative for photophobia and visual disturbance.  Respiratory: Negative for cough, chest tightness, shortness of breath, wheezing and stridor.   Cardiovascular: Negative for chest pain, palpitations and leg swelling.  Gastrointestinal: Negative for nausea, vomiting, abdominal pain, diarrhea, constipation, blood in stool, abdominal distention and anal bleeding.  Genitourinary: Negative for dysuria, hematuria, flank pain and difficulty urinating.  Musculoskeletal: Negative for myalgias, back pain, joint swelling, arthralgias and gait problem.  Skin: Positive for wound. Negative for color change, pallor and rash.  Neurological: Negative for dizziness, tremors, weakness and light-headedness.  Hematological: Negative for adenopathy. Does not bruise/bleed easily.  Psychiatric/Behavioral: Positive for dysphoric mood. Negative for behavioral problems, confusion, sleep disturbance, decreased concentration and agitation.       Objective:   Physical Exam  Constitutional: She is oriented to person, place, and time. She appears well-developed and well-nourished. No distress.  HENT:  Head: Normocephalic and atraumatic.  Mouth/Throat: Oropharynx is clear and moist. No oropharyngeal exudate.  Eyes: Conjunctivae and EOM are normal. No scleral icterus.  Neck: Normal range of motion. Neck supple.  Cardiovascular: Normal rate.   Pulmonary/Chest: Effort normal. No respiratory distress. She has no wheezes.  Abdominal: She exhibits no distension.  Musculoskeletal: She exhibits edema. She exhibits  no tenderness.        Feet:  Neurological: She is alert and oriented to person, place, and time. She exhibits normal muscle tone. Coordination normal.  Skin: Skin is warm and dry. She is not diaphoretic. No erythema. No pallor.  Psychiatric: Her behavior is normal. Thought content normal. Her mood appears anxious. She exhibits a depressed mood.  Tearful was angry at Dr. Ulice Brilliant. Was threatening to sue him and even made remark of wishing harm to him but after I questioned her she stated that she would never do such a thing.          Assessment & Plan:   #1 Osteomyelitis of 5th MT with fracture and 4th MT:  --refer to Dr. Toni Arthurs, MD --check ESr, crp, cmp, cbc --will consider CT with contrast if this would help elucidate what surgery will need to be done --HOLD OFF ALL ABX UNTIL WE HAVE CULTURES OF BONE IN THEOR TO GUIDE POST OPERATIVE COURSE OF IV ABX WHICH MAY BE 2 IF NOT 4-6 WEEKSO F IV ABX --wound care per Dr. Leanord Hawking --I am not an enthusiast for the hyperbaric chamber but will defer to Dr. Leanord Hawking I spent greater than 60 minutes with the patient including greater than 50% of time in face to face counsel of the patient and in coordination of their care.   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  #4 CHF: she was to get TTE and CXR pre hyperbaric chamber  #5 Depression, anger: she is upset with Dr. Ulice Brilliant whom she blames for her current situation --at least in part. I will send note to Dr. Ulice Brilliant and seek records from him I will inform her pt is upset. She denies she would seek to physically harm him and is contracted for safety.  #6 Need for flu vaccine: flu vax

## 2013-01-11 ENCOUNTER — Ambulatory Visit (HOSPITAL_COMMUNITY)
Admission: RE | Admit: 2013-01-11 | Discharge: 2013-01-11 | Disposition: A | Discharge status: Home / Self Care | Payer: Medicare Other | Source: Ambulatory Visit | Attending: Internal Medicine | Admitting: Internal Medicine

## 2013-01-11 ENCOUNTER — Other Ambulatory Visit (HOSPITAL_BASED_OUTPATIENT_CLINIC_OR_DEPARTMENT_OTHER): Payer: Self-pay | Admitting: Internal Medicine

## 2013-01-11 DIAGNOSIS — R0989 Other specified symptoms and signs involving the circulatory and respiratory systems: Secondary | ICD-10-CM | POA: Diagnosis not present

## 2013-01-11 DIAGNOSIS — Z6839 Body mass index (BMI) 39.0-39.9, adult: Secondary | ICD-10-CM | POA: Insufficient documentation

## 2013-01-11 DIAGNOSIS — I447 Left bundle-branch block, unspecified: Secondary | ICD-10-CM | POA: Insufficient documentation

## 2013-01-11 DIAGNOSIS — Z139 Encounter for screening, unspecified: Secondary | ICD-10-CM

## 2013-01-11 DIAGNOSIS — E119 Type 2 diabetes mellitus without complications: Secondary | ICD-10-CM | POA: Insufficient documentation

## 2013-01-11 DIAGNOSIS — I10 Essential (primary) hypertension: Secondary | ICD-10-CM | POA: Diagnosis not present

## 2013-01-11 DIAGNOSIS — Z87891 Personal history of nicotine dependence: Secondary | ICD-10-CM | POA: Insufficient documentation

## 2013-01-11 DIAGNOSIS — I509 Heart failure, unspecified: Secondary | ICD-10-CM | POA: Insufficient documentation

## 2013-01-11 DIAGNOSIS — Z01818 Encounter for other preprocedural examination: Secondary | ICD-10-CM | POA: Diagnosis not present

## 2013-01-11 DIAGNOSIS — I4891 Unspecified atrial fibrillation: Secondary | ICD-10-CM | POA: Insufficient documentation

## 2013-01-11 DIAGNOSIS — I059 Rheumatic mitral valve disease, unspecified: Secondary | ICD-10-CM

## 2013-01-11 LAB — BASIC METABOLIC PANEL WITH GFR
BUN: 20 mg/dL (ref 6–23)
CO2: 29 mEq/L (ref 19–32)
Chloride: 101 mEq/L (ref 96–112)
Glucose, Bld: 191 mg/dL — ABNORMAL HIGH (ref 70–99)
Potassium: 4.8 mEq/L (ref 3.5–5.3)
Sodium: 138 mEq/L (ref 135–145)

## 2013-01-11 NOTE — Progress Notes (Signed)
*  PRELIMINARY RESULTS* Echocardiogram 2D Echocardiogram has been performed.  [name] [name] 01/11/2013, 12:25 PM

## 2013-01-12 ENCOUNTER — Encounter (HOSPITAL_BASED_OUTPATIENT_CLINIC_OR_DEPARTMENT_OTHER): Payer: Medicare Other | Attending: Internal Medicine

## 2013-01-12 DIAGNOSIS — E1169 Type 2 diabetes mellitus with other specified complication: Secondary | ICD-10-CM | POA: Insufficient documentation

## 2013-01-12 DIAGNOSIS — L97509 Non-pressure chronic ulcer of other part of unspecified foot with unspecified severity: Secondary | ICD-10-CM | POA: Diagnosis not present

## 2013-01-19 DIAGNOSIS — L97509 Non-pressure chronic ulcer of other part of unspecified foot with unspecified severity: Secondary | ICD-10-CM | POA: Diagnosis not present

## 2013-01-19 DIAGNOSIS — E1169 Type 2 diabetes mellitus with other specified complication: Secondary | ICD-10-CM | POA: Diagnosis not present

## 2013-01-20 ENCOUNTER — Encounter: Payer: Self-pay | Admitting: *Deleted

## 2013-01-26 ENCOUNTER — Other Ambulatory Visit: Payer: Self-pay

## 2013-01-26 ENCOUNTER — Other Ambulatory Visit: Payer: Self-pay | Admitting: Family Medicine

## 2013-01-26 DIAGNOSIS — E1169 Type 2 diabetes mellitus with other specified complication: Secondary | ICD-10-CM | POA: Diagnosis not present

## 2013-01-26 DIAGNOSIS — L97509 Non-pressure chronic ulcer of other part of unspecified foot with unspecified severity: Secondary | ICD-10-CM | POA: Diagnosis not present

## 2013-01-26 MED ORDER — FUROSEMIDE 40 MG PO TABS: 40.0000 mg | ORAL_TABLET | Freq: Two times a day (BID) | ORAL | Status: DC

## 2013-01-26 NOTE — Telephone Encounter (Signed)
Ok 5 ref monthly

## 2013-01-26 NOTE — Telephone Encounter (Signed)
Not seen since Epic. 

## 2013-01-30 ENCOUNTER — Ambulatory Visit (HOSPITAL_COMMUNITY)
Admission: RE | Admit: 2013-01-30 | Discharge: 2013-01-30 | Disposition: A | Discharge status: Home / Self Care | Payer: Medicare Other | Source: Ambulatory Visit | Attending: "Endocrinology | Admitting: "Endocrinology

## 2013-01-30 DIAGNOSIS — E1169 Type 2 diabetes mellitus with other specified complication: Secondary | ICD-10-CM | POA: Diagnosis not present

## 2013-01-30 DIAGNOSIS — E042 Nontoxic multinodular goiter: Secondary | ICD-10-CM | POA: Insufficient documentation

## 2013-01-30 DIAGNOSIS — L97509 Non-pressure chronic ulcer of other part of unspecified foot with unspecified severity: Secondary | ICD-10-CM | POA: Diagnosis not present

## 2013-01-31 DIAGNOSIS — E1169 Type 2 diabetes mellitus with other specified complication: Secondary | ICD-10-CM | POA: Diagnosis not present

## 2013-01-31 DIAGNOSIS — L97509 Non-pressure chronic ulcer of other part of unspecified foot with unspecified severity: Secondary | ICD-10-CM | POA: Diagnosis not present

## 2013-01-31 LAB — GLUCOSE, CAPILLARY
Glucose-Capillary: 190 mg/dL — ABNORMAL HIGH (ref 70–99)
Glucose-Capillary: 128 mg/dL — ABNORMAL HIGH (ref 70–99)
Glucose-Capillary: 172 mg/dL — ABNORMAL HIGH (ref 70–99)

## 2013-02-01 DIAGNOSIS — E1169 Type 2 diabetes mellitus with other specified complication: Secondary | ICD-10-CM | POA: Diagnosis not present

## 2013-02-01 DIAGNOSIS — L97509 Non-pressure chronic ulcer of other part of unspecified foot with unspecified severity: Secondary | ICD-10-CM | POA: Diagnosis not present

## 2013-02-01 LAB — GLUCOSE, CAPILLARY: Glucose-Capillary: 92 mg/dL (ref 70–99)

## 2013-02-02 DIAGNOSIS — E1169 Type 2 diabetes mellitus with other specified complication: Secondary | ICD-10-CM | POA: Diagnosis not present

## 2013-02-02 DIAGNOSIS — L97509 Non-pressure chronic ulcer of other part of unspecified foot with unspecified severity: Secondary | ICD-10-CM | POA: Diagnosis not present

## 2013-02-02 LAB — GLUCOSE, CAPILLARY: Glucose-Capillary: 223 mg/dL — ABNORMAL HIGH (ref 70–99)

## 2013-02-03 DIAGNOSIS — E1169 Type 2 diabetes mellitus with other specified complication: Secondary | ICD-10-CM | POA: Diagnosis not present

## 2013-02-03 DIAGNOSIS — E042 Nontoxic multinodular goiter: Secondary | ICD-10-CM | POA: Diagnosis not present

## 2013-02-03 DIAGNOSIS — E785 Hyperlipidemia, unspecified: Secondary | ICD-10-CM | POA: Diagnosis not present

## 2013-02-03 DIAGNOSIS — IMO0001 Reserved for inherently not codable concepts without codable children: Secondary | ICD-10-CM | POA: Diagnosis not present

## 2013-02-03 DIAGNOSIS — E039 Hypothyroidism, unspecified: Secondary | ICD-10-CM | POA: Diagnosis not present

## 2013-02-03 DIAGNOSIS — L97509 Non-pressure chronic ulcer of other part of unspecified foot with unspecified severity: Secondary | ICD-10-CM | POA: Diagnosis not present

## 2013-02-03 DIAGNOSIS — I10 Essential (primary) hypertension: Secondary | ICD-10-CM | POA: Diagnosis not present

## 2013-02-03 LAB — GLUCOSE, CAPILLARY
Glucose-Capillary: 263 mg/dL — ABNORMAL HIGH (ref 70–99)
Glucose-Capillary: 209 mg/dL — ABNORMAL HIGH (ref 70–99)

## 2013-02-06 DIAGNOSIS — E1169 Type 2 diabetes mellitus with other specified complication: Secondary | ICD-10-CM | POA: Diagnosis not present

## 2013-02-06 DIAGNOSIS — L97509 Non-pressure chronic ulcer of other part of unspecified foot with unspecified severity: Secondary | ICD-10-CM | POA: Diagnosis not present

## 2013-02-06 LAB — GLUCOSE, CAPILLARY
Glucose-Capillary: 127 mg/dL — ABNORMAL HIGH (ref 70–99)
Glucose-Capillary: 131 mg/dL — ABNORMAL HIGH (ref 70–99)

## 2013-02-07 DIAGNOSIS — E1169 Type 2 diabetes mellitus with other specified complication: Secondary | ICD-10-CM | POA: Diagnosis not present

## 2013-02-07 DIAGNOSIS — L97509 Non-pressure chronic ulcer of other part of unspecified foot with unspecified severity: Secondary | ICD-10-CM | POA: Diagnosis not present

## 2013-02-07 LAB — GLUCOSE, CAPILLARY
Glucose-Capillary: 221 mg/dL — ABNORMAL HIGH (ref 70–99)
Glucose-Capillary: 170 mg/dL — ABNORMAL HIGH (ref 70–99)

## 2013-02-08 ENCOUNTER — Telehealth: Payer: Self-pay | Admitting: *Deleted

## 2013-02-08 ENCOUNTER — Ambulatory Visit: Payer: Medicare Other | Admitting: Infectious Disease

## 2013-02-08 DIAGNOSIS — L97509 Non-pressure chronic ulcer of other part of unspecified foot with unspecified severity: Secondary | ICD-10-CM | POA: Diagnosis not present

## 2013-02-08 DIAGNOSIS — E1169 Type 2 diabetes mellitus with other specified complication: Secondary | ICD-10-CM | POA: Diagnosis not present

## 2013-02-08 LAB — GLUCOSE, CAPILLARY: Glucose-Capillary: 121 mg/dL — ABNORMAL HIGH (ref 70–99)

## 2013-02-08 NOTE — Telephone Encounter (Signed)
Left message informing patient of missed appointment, gave contact information to reschedule her appointment. Andree Coss, RN

## 2013-02-09 DIAGNOSIS — E1169 Type 2 diabetes mellitus with other specified complication: Secondary | ICD-10-CM | POA: Diagnosis not present

## 2013-02-09 DIAGNOSIS — L97509 Non-pressure chronic ulcer of other part of unspecified foot with unspecified severity: Secondary | ICD-10-CM | POA: Diagnosis not present

## 2013-02-09 LAB — GLUCOSE, CAPILLARY: Glucose-Capillary: 122 mg/dL — ABNORMAL HIGH (ref 70–99)

## 2013-02-13 ENCOUNTER — Encounter (HOSPITAL_BASED_OUTPATIENT_CLINIC_OR_DEPARTMENT_OTHER): Payer: Medicare Other | Attending: Internal Medicine

## 2013-02-13 DIAGNOSIS — L97509 Non-pressure chronic ulcer of other part of unspecified foot with unspecified severity: Secondary | ICD-10-CM | POA: Diagnosis not present

## 2013-02-13 DIAGNOSIS — E1169 Type 2 diabetes mellitus with other specified complication: Secondary | ICD-10-CM | POA: Insufficient documentation

## 2013-02-13 DIAGNOSIS — M869 Osteomyelitis, unspecified: Secondary | ICD-10-CM | POA: Diagnosis not present

## 2013-02-14 DIAGNOSIS — L97509 Non-pressure chronic ulcer of other part of unspecified foot with unspecified severity: Secondary | ICD-10-CM | POA: Diagnosis not present

## 2013-02-14 DIAGNOSIS — M869 Osteomyelitis, unspecified: Secondary | ICD-10-CM | POA: Diagnosis not present

## 2013-02-14 DIAGNOSIS — E1169 Type 2 diabetes mellitus with other specified complication: Secondary | ICD-10-CM | POA: Diagnosis not present

## 2013-02-14 LAB — GLUCOSE, CAPILLARY
Glucose-Capillary: 189 mg/dL — ABNORMAL HIGH (ref 70–99)
Glucose-Capillary: 131 mg/dL — ABNORMAL HIGH (ref 70–99)

## 2013-02-15 DIAGNOSIS — M869 Osteomyelitis, unspecified: Secondary | ICD-10-CM | POA: Diagnosis not present

## 2013-02-15 DIAGNOSIS — E1169 Type 2 diabetes mellitus with other specified complication: Secondary | ICD-10-CM | POA: Diagnosis not present

## 2013-02-15 DIAGNOSIS — L97509 Non-pressure chronic ulcer of other part of unspecified foot with unspecified severity: Secondary | ICD-10-CM | POA: Diagnosis not present

## 2013-02-15 LAB — GLUCOSE, CAPILLARY
Glucose-Capillary: 135 mg/dL — ABNORMAL HIGH (ref 70–99)
Glucose-Capillary: 92 mg/dL (ref 70–99)

## 2013-02-16 DIAGNOSIS — E669 Obesity, unspecified: Secondary | ICD-10-CM | POA: Diagnosis not present

## 2013-02-16 DIAGNOSIS — E785 Hyperlipidemia, unspecified: Secondary | ICD-10-CM | POA: Diagnosis not present

## 2013-02-16 DIAGNOSIS — L97509 Non-pressure chronic ulcer of other part of unspecified foot with unspecified severity: Secondary | ICD-10-CM | POA: Diagnosis not present

## 2013-02-16 DIAGNOSIS — E039 Hypothyroidism, unspecified: Secondary | ICD-10-CM | POA: Diagnosis not present

## 2013-02-16 DIAGNOSIS — M869 Osteomyelitis, unspecified: Secondary | ICD-10-CM | POA: Diagnosis not present

## 2013-02-16 DIAGNOSIS — I10 Essential (primary) hypertension: Secondary | ICD-10-CM | POA: Diagnosis not present

## 2013-02-16 DIAGNOSIS — E1169 Type 2 diabetes mellitus with other specified complication: Secondary | ICD-10-CM | POA: Diagnosis not present

## 2013-02-16 LAB — GLUCOSE, CAPILLARY: Glucose-Capillary: 136 mg/dL — ABNORMAL HIGH (ref 70–99)

## 2013-02-17 DIAGNOSIS — L97509 Non-pressure chronic ulcer of other part of unspecified foot with unspecified severity: Secondary | ICD-10-CM | POA: Diagnosis not present

## 2013-02-17 DIAGNOSIS — E1169 Type 2 diabetes mellitus with other specified complication: Secondary | ICD-10-CM | POA: Diagnosis not present

## 2013-02-17 DIAGNOSIS — M869 Osteomyelitis, unspecified: Secondary | ICD-10-CM | POA: Diagnosis not present

## 2013-02-17 LAB — GLUCOSE, CAPILLARY: Glucose-Capillary: 193 mg/dL — ABNORMAL HIGH (ref 70–99)

## 2013-02-20 DIAGNOSIS — L97509 Non-pressure chronic ulcer of other part of unspecified foot with unspecified severity: Secondary | ICD-10-CM | POA: Diagnosis not present

## 2013-02-20 DIAGNOSIS — E1169 Type 2 diabetes mellitus with other specified complication: Secondary | ICD-10-CM | POA: Diagnosis not present

## 2013-02-20 DIAGNOSIS — M869 Osteomyelitis, unspecified: Secondary | ICD-10-CM | POA: Diagnosis not present

## 2013-02-20 LAB — GLUCOSE, CAPILLARY: Glucose-Capillary: 179 mg/dL — ABNORMAL HIGH (ref 70–99)

## 2013-02-21 DIAGNOSIS — M869 Osteomyelitis, unspecified: Secondary | ICD-10-CM | POA: Diagnosis not present

## 2013-02-21 DIAGNOSIS — L97509 Non-pressure chronic ulcer of other part of unspecified foot with unspecified severity: Secondary | ICD-10-CM | POA: Diagnosis not present

## 2013-02-21 DIAGNOSIS — E1169 Type 2 diabetes mellitus with other specified complication: Secondary | ICD-10-CM | POA: Diagnosis not present

## 2013-02-22 DIAGNOSIS — M869 Osteomyelitis, unspecified: Secondary | ICD-10-CM | POA: Diagnosis not present

## 2013-02-22 DIAGNOSIS — E1169 Type 2 diabetes mellitus with other specified complication: Secondary | ICD-10-CM | POA: Diagnosis not present

## 2013-02-22 DIAGNOSIS — L97509 Non-pressure chronic ulcer of other part of unspecified foot with unspecified severity: Secondary | ICD-10-CM | POA: Diagnosis not present

## 2013-02-22 LAB — GLUCOSE, CAPILLARY
Glucose-Capillary: 108 mg/dL — ABNORMAL HIGH (ref 70–99)
Glucose-Capillary: 119 mg/dL — ABNORMAL HIGH (ref 70–99)

## 2013-02-23 DIAGNOSIS — E119 Type 2 diabetes mellitus without complications: Secondary | ICD-10-CM | POA: Diagnosis not present

## 2013-02-23 DIAGNOSIS — M869 Osteomyelitis, unspecified: Secondary | ICD-10-CM | POA: Diagnosis not present

## 2013-02-23 DIAGNOSIS — L97509 Non-pressure chronic ulcer of other part of unspecified foot with unspecified severity: Secondary | ICD-10-CM | POA: Diagnosis not present

## 2013-02-23 DIAGNOSIS — E1169 Type 2 diabetes mellitus with other specified complication: Secondary | ICD-10-CM | POA: Diagnosis not present

## 2013-02-23 LAB — GLUCOSE, CAPILLARY
Glucose-Capillary: 228 mg/dL — ABNORMAL HIGH (ref 70–99)
Glucose-Capillary: 193 mg/dL — ABNORMAL HIGH (ref 70–99)

## 2013-02-24 DIAGNOSIS — M869 Osteomyelitis, unspecified: Secondary | ICD-10-CM | POA: Diagnosis not present

## 2013-02-24 DIAGNOSIS — E1169 Type 2 diabetes mellitus with other specified complication: Secondary | ICD-10-CM | POA: Diagnosis not present

## 2013-02-24 DIAGNOSIS — L97509 Non-pressure chronic ulcer of other part of unspecified foot with unspecified severity: Secondary | ICD-10-CM | POA: Diagnosis not present

## 2013-02-28 DIAGNOSIS — E1169 Type 2 diabetes mellitus with other specified complication: Secondary | ICD-10-CM | POA: Diagnosis not present

## 2013-02-28 DIAGNOSIS — M869 Osteomyelitis, unspecified: Secondary | ICD-10-CM | POA: Diagnosis not present

## 2013-02-28 DIAGNOSIS — L97509 Non-pressure chronic ulcer of other part of unspecified foot with unspecified severity: Secondary | ICD-10-CM | POA: Diagnosis not present

## 2013-02-28 LAB — GLUCOSE, CAPILLARY: Glucose-Capillary: 166 mg/dL — ABNORMAL HIGH (ref 70–99)

## 2013-03-01 ENCOUNTER — Encounter: Payer: Self-pay | Admitting: *Deleted

## 2013-03-01 DIAGNOSIS — L97509 Non-pressure chronic ulcer of other part of unspecified foot with unspecified severity: Secondary | ICD-10-CM | POA: Diagnosis not present

## 2013-03-01 DIAGNOSIS — E1169 Type 2 diabetes mellitus with other specified complication: Secondary | ICD-10-CM | POA: Diagnosis not present

## 2013-03-01 DIAGNOSIS — M869 Osteomyelitis, unspecified: Secondary | ICD-10-CM | POA: Diagnosis not present

## 2013-03-01 LAB — GLUCOSE, CAPILLARY
Glucose-Capillary: 155 mg/dL — ABNORMAL HIGH (ref 70–99)
Glucose-Capillary: 174 mg/dL — ABNORMAL HIGH (ref 70–99)

## 2013-03-02 DIAGNOSIS — E1169 Type 2 diabetes mellitus with other specified complication: Secondary | ICD-10-CM | POA: Diagnosis not present

## 2013-03-02 DIAGNOSIS — L97509 Non-pressure chronic ulcer of other part of unspecified foot with unspecified severity: Secondary | ICD-10-CM | POA: Diagnosis not present

## 2013-03-02 DIAGNOSIS — M869 Osteomyelitis, unspecified: Secondary | ICD-10-CM | POA: Diagnosis not present

## 2013-03-02 LAB — GLUCOSE, CAPILLARY
Glucose-Capillary: 139 mg/dL — ABNORMAL HIGH (ref 70–99)
Glucose-Capillary: 109 mg/dL — ABNORMAL HIGH (ref 70–99)

## 2013-03-03 DIAGNOSIS — E1169 Type 2 diabetes mellitus with other specified complication: Secondary | ICD-10-CM | POA: Diagnosis not present

## 2013-03-03 DIAGNOSIS — M869 Osteomyelitis, unspecified: Secondary | ICD-10-CM | POA: Diagnosis not present

## 2013-03-03 DIAGNOSIS — L97509 Non-pressure chronic ulcer of other part of unspecified foot with unspecified severity: Secondary | ICD-10-CM | POA: Diagnosis not present

## 2013-03-03 LAB — GLUCOSE, CAPILLARY: Glucose-Capillary: 123 mg/dL — ABNORMAL HIGH (ref 70–99)

## 2013-03-03 NOTE — Progress Notes (Signed)
[facility]  NAME:  [name] [name], [name] [name] NO.:  [id number]  MEDICAL RECORD NO.:  [id number]      DATE OF BIRTH:  1945-09-06  PHYSICIAN:  Maxwell Caul, M.D.      VISIT DATE:                                  OFFICE VISIT   [name] is a 67 year old type 2 diabetic, who has a wound over the left lateral foot at the base of her left 5th toe over the metatarsal head.  This is a wound that has been present for just about a year.  She has underlying osteomyelitis.  X-ray of the foot showed periosteal thickening of the 4th metatarsal consistent with chronic osteomyelitis. She also had deformity and sclerosis of the fifth metatarsal consistent with chronic osteomyelitis.  We have been treating her with a combination of hyperbaric oxygen, and oral antibiotics as the patient steadfastly refused to consider a surgical option.  We have been making improvement in the overall condition of the area considerably less swelling and deformity than when we 1st started, however, the wound continues to be a difficult management problem largely due to adherent surface eschar.  PHYSICAL EXAMINATION:  There was no major change in the condition of the wound.  Periwound swelling which at one point had involved the left 5th toe and the surrounding area has considerably improved.  We are continuing a Santyl based dressing to a point where a healthy wound base is obtained.  Then, I would like to use an advanced treatment option either Oasis or an Apligraf.  IMPRESSION:  Wegner's grade 3 diabetic foot ulcer.  She continues on a combination of Levaquin and Septra with overall improvement.  I would like to continue to have her in hyperbaric treatment for another 30-day certification.  As mentioned, we are making progress here as that applies to the infection although the wound issue is still problematic.          ______________________________ Maxwell Caul,  M.D.     MGR/MEDQ  D:  03/02/2013  T:  03/03/2013  Job:  [id number]

## 2013-03-06 DIAGNOSIS — M869 Osteomyelitis, unspecified: Secondary | ICD-10-CM | POA: Diagnosis not present

## 2013-03-06 DIAGNOSIS — E1169 Type 2 diabetes mellitus with other specified complication: Secondary | ICD-10-CM | POA: Diagnosis not present

## 2013-03-06 DIAGNOSIS — L97509 Non-pressure chronic ulcer of other part of unspecified foot with unspecified severity: Secondary | ICD-10-CM | POA: Diagnosis not present

## 2013-03-06 LAB — GLUCOSE, CAPILLARY
Glucose-Capillary: 176 mg/dL — ABNORMAL HIGH (ref 70–99)
Glucose-Capillary: 164 mg/dL — ABNORMAL HIGH (ref 70–99)

## 2013-03-07 DIAGNOSIS — M869 Osteomyelitis, unspecified: Secondary | ICD-10-CM | POA: Diagnosis not present

## 2013-03-07 DIAGNOSIS — E1169 Type 2 diabetes mellitus with other specified complication: Secondary | ICD-10-CM | POA: Diagnosis not present

## 2013-03-07 DIAGNOSIS — L97509 Non-pressure chronic ulcer of other part of unspecified foot with unspecified severity: Secondary | ICD-10-CM | POA: Diagnosis not present

## 2013-03-08 ENCOUNTER — Other Ambulatory Visit: Payer: Self-pay | Admitting: *Deleted

## 2013-03-08 ENCOUNTER — Other Ambulatory Visit: Payer: Self-pay | Admitting: Family Medicine

## 2013-03-08 DIAGNOSIS — E1169 Type 2 diabetes mellitus with other specified complication: Secondary | ICD-10-CM | POA: Diagnosis not present

## 2013-03-08 DIAGNOSIS — M869 Osteomyelitis, unspecified: Secondary | ICD-10-CM | POA: Diagnosis not present

## 2013-03-08 DIAGNOSIS — L97509 Non-pressure chronic ulcer of other part of unspecified foot with unspecified severity: Secondary | ICD-10-CM | POA: Diagnosis not present

## 2013-03-08 LAB — GLUCOSE, CAPILLARY: Glucose-Capillary: 151 mg/dL — ABNORMAL HIGH (ref 70–99)

## 2013-03-08 MED ORDER — SIMVASTATIN 80 MG PO TABS: 80.0000 mg | ORAL_TABLET | Freq: Every day | ORAL | Status: DC

## 2013-03-10 ENCOUNTER — Other Ambulatory Visit: Payer: Self-pay

## 2013-03-10 MED ORDER — CARVEDILOL 25 MG PO TABS: 25.0000 mg | ORAL_TABLET | Freq: Two times a day (BID) | ORAL | Status: DC

## 2013-03-13 ENCOUNTER — Encounter (HOSPITAL_BASED_OUTPATIENT_CLINIC_OR_DEPARTMENT_OTHER): Payer: Medicare Other | Attending: Internal Medicine

## 2013-03-13 DIAGNOSIS — E1169 Type 2 diabetes mellitus with other specified complication: Secondary | ICD-10-CM | POA: Insufficient documentation

## 2013-03-13 DIAGNOSIS — M869 Osteomyelitis, unspecified: Secondary | ICD-10-CM | POA: Diagnosis not present

## 2013-03-13 DIAGNOSIS — Z794 Long term (current) use of insulin: Secondary | ICD-10-CM | POA: Diagnosis not present

## 2013-03-13 DIAGNOSIS — M9080 Osteopathy in diseases classified elsewhere, unspecified site: Secondary | ICD-10-CM | POA: Insufficient documentation

## 2013-03-13 DIAGNOSIS — L97509 Non-pressure chronic ulcer of other part of unspecified foot with unspecified severity: Secondary | ICD-10-CM | POA: Diagnosis not present

## 2013-03-13 LAB — GLUCOSE, CAPILLARY: Glucose-Capillary: 156 mg/dL — ABNORMAL HIGH (ref 70–99)

## 2013-03-14 DIAGNOSIS — E1169 Type 2 diabetes mellitus with other specified complication: Secondary | ICD-10-CM | POA: Diagnosis not present

## 2013-03-14 DIAGNOSIS — L97509 Non-pressure chronic ulcer of other part of unspecified foot with unspecified severity: Secondary | ICD-10-CM | POA: Diagnosis not present

## 2013-03-14 DIAGNOSIS — M869 Osteomyelitis, unspecified: Secondary | ICD-10-CM | POA: Diagnosis not present

## 2013-03-14 DIAGNOSIS — M9080 Osteopathy in diseases classified elsewhere, unspecified site: Secondary | ICD-10-CM | POA: Diagnosis not present

## 2013-03-15 DIAGNOSIS — M9080 Osteopathy in diseases classified elsewhere, unspecified site: Secondary | ICD-10-CM | POA: Diagnosis not present

## 2013-03-15 DIAGNOSIS — E1169 Type 2 diabetes mellitus with other specified complication: Secondary | ICD-10-CM | POA: Diagnosis not present

## 2013-03-15 DIAGNOSIS — M869 Osteomyelitis, unspecified: Secondary | ICD-10-CM | POA: Diagnosis not present

## 2013-03-15 DIAGNOSIS — L97509 Non-pressure chronic ulcer of other part of unspecified foot with unspecified severity: Secondary | ICD-10-CM | POA: Diagnosis not present

## 2013-03-15 LAB — GLUCOSE, CAPILLARY
Glucose-Capillary: 178 mg/dL — ABNORMAL HIGH (ref 70–99)
Glucose-Capillary: 194 mg/dL — ABNORMAL HIGH (ref 70–99)

## 2013-03-17 DIAGNOSIS — M9080 Osteopathy in diseases classified elsewhere, unspecified site: Secondary | ICD-10-CM | POA: Diagnosis not present

## 2013-03-17 DIAGNOSIS — L97509 Non-pressure chronic ulcer of other part of unspecified foot with unspecified severity: Secondary | ICD-10-CM | POA: Diagnosis not present

## 2013-03-17 DIAGNOSIS — M869 Osteomyelitis, unspecified: Secondary | ICD-10-CM | POA: Diagnosis not present

## 2013-03-17 DIAGNOSIS — E1169 Type 2 diabetes mellitus with other specified complication: Secondary | ICD-10-CM | POA: Diagnosis not present

## 2013-03-17 LAB — GLUCOSE, CAPILLARY: Glucose-Capillary: 221 mg/dL — ABNORMAL HIGH (ref 70–99)

## 2013-03-20 DIAGNOSIS — L97509 Non-pressure chronic ulcer of other part of unspecified foot with unspecified severity: Secondary | ICD-10-CM | POA: Diagnosis not present

## 2013-03-20 DIAGNOSIS — M9080 Osteopathy in diseases classified elsewhere, unspecified site: Secondary | ICD-10-CM | POA: Diagnosis not present

## 2013-03-20 DIAGNOSIS — M869 Osteomyelitis, unspecified: Secondary | ICD-10-CM | POA: Diagnosis not present

## 2013-03-20 DIAGNOSIS — E1169 Type 2 diabetes mellitus with other specified complication: Secondary | ICD-10-CM | POA: Diagnosis not present

## 2013-03-20 LAB — GLUCOSE, CAPILLARY
Glucose-Capillary: 255 mg/dL — ABNORMAL HIGH (ref 70–99)
Glucose-Capillary: 222 mg/dL — ABNORMAL HIGH (ref 70–99)

## 2013-03-21 DIAGNOSIS — M869 Osteomyelitis, unspecified: Secondary | ICD-10-CM | POA: Diagnosis not present

## 2013-03-21 DIAGNOSIS — L97509 Non-pressure chronic ulcer of other part of unspecified foot with unspecified severity: Secondary | ICD-10-CM | POA: Diagnosis not present

## 2013-03-21 DIAGNOSIS — E1169 Type 2 diabetes mellitus with other specified complication: Secondary | ICD-10-CM | POA: Diagnosis not present

## 2013-03-21 DIAGNOSIS — M9080 Osteopathy in diseases classified elsewhere, unspecified site: Secondary | ICD-10-CM | POA: Diagnosis not present

## 2013-03-21 LAB — GLUCOSE, CAPILLARY: Glucose-Capillary: 228 mg/dL — ABNORMAL HIGH (ref 70–99)

## 2013-03-22 ENCOUNTER — Ambulatory Visit: Payer: Medicare Other | Admitting: *Deleted

## 2013-03-22 DIAGNOSIS — I4891 Unspecified atrial fibrillation: Secondary | ICD-10-CM

## 2013-03-22 LAB — CBC
MCH: 29.4 pg (ref 26.0–34.0)
MCHC: 33.1 g/dL (ref 30.0–36.0)
MCV: 88.8 fL (ref 78.0–100.0)
RBC: 4.02 MIL/uL (ref 3.87–5.11)
RDW: 14.1 % (ref 11.5–15.5)
WBC: 6.7 10*3/uL (ref 4.0–10.5)

## 2013-03-23 ENCOUNTER — Encounter: Payer: Self-pay | Admitting: *Deleted

## 2013-03-23 DIAGNOSIS — M869 Osteomyelitis, unspecified: Secondary | ICD-10-CM | POA: Diagnosis not present

## 2013-03-23 DIAGNOSIS — L97509 Non-pressure chronic ulcer of other part of unspecified foot with unspecified severity: Secondary | ICD-10-CM | POA: Diagnosis not present

## 2013-03-23 DIAGNOSIS — M9080 Osteopathy in diseases classified elsewhere, unspecified site: Secondary | ICD-10-CM | POA: Diagnosis not present

## 2013-03-23 DIAGNOSIS — E1169 Type 2 diabetes mellitus with other specified complication: Secondary | ICD-10-CM | POA: Diagnosis not present

## 2013-03-23 LAB — BASIC METABOLIC PANEL
BUN: 17 mg/dL (ref 6–23)
CO2: 26 mEq/L (ref 19–32)
Calcium: 9.9 mg/dL (ref 8.4–10.5)
Creat: 1.09 mg/dL (ref 0.50–1.10)
Glucose, Bld: 110 mg/dL — ABNORMAL HIGH (ref 70–99)
Potassium: 4.5 mEq/L (ref 3.5–5.3)
Sodium: 138 mEq/L (ref 135–145)

## 2013-03-24 ENCOUNTER — Encounter: Payer: Self-pay | Admitting: *Deleted

## 2013-03-30 DIAGNOSIS — M869 Osteomyelitis, unspecified: Secondary | ICD-10-CM | POA: Diagnosis not present

## 2013-03-30 DIAGNOSIS — L97509 Non-pressure chronic ulcer of other part of unspecified foot with unspecified severity: Secondary | ICD-10-CM | POA: Diagnosis not present

## 2013-03-30 DIAGNOSIS — M9080 Osteopathy in diseases classified elsewhere, unspecified site: Secondary | ICD-10-CM | POA: Diagnosis not present

## 2013-03-30 DIAGNOSIS — E1169 Type 2 diabetes mellitus with other specified complication: Secondary | ICD-10-CM | POA: Diagnosis not present

## 2013-04-17 ENCOUNTER — Ambulatory Visit: Payer: Self-pay | Admitting: *Deleted

## 2013-04-17 NOTE — Patient Instructions (Signed)
Pt here here on 03/22/13 for Eliquis follow up.    Wt 252.4   Labs from 03/22/13 showed Serum Cr. 1.09, Hgb11.8, HCT 35.7.  CrCl 93.  Pt is tolerating Eliquis well.  Denies bleeding, abnormal bruising or GI upset.  Will see pt again in 6 months.

## 2013-04-20 ENCOUNTER — Encounter (HOSPITAL_BASED_OUTPATIENT_CLINIC_OR_DEPARTMENT_OTHER): Payer: Medicare Other | Attending: Internal Medicine

## 2013-04-20 DIAGNOSIS — L97509 Non-pressure chronic ulcer of other part of unspecified foot with unspecified severity: Secondary | ICD-10-CM | POA: Insufficient documentation

## 2013-04-20 DIAGNOSIS — M9080 Osteopathy in diseases classified elsewhere, unspecified site: Secondary | ICD-10-CM | POA: Insufficient documentation

## 2013-04-20 DIAGNOSIS — E1169 Type 2 diabetes mellitus with other specified complication: Secondary | ICD-10-CM | POA: Insufficient documentation

## 2013-04-20 DIAGNOSIS — M869 Osteomyelitis, unspecified: Secondary | ICD-10-CM | POA: Insufficient documentation

## 2013-04-25 ENCOUNTER — Encounter: Payer: Self-pay | Admitting: Cardiology

## 2013-04-25 ENCOUNTER — Encounter: Payer: Medicare Other | Admitting: Cardiology

## 2013-04-25 NOTE — Progress Notes (Signed)
No show  This encounter was created in error - please disregard.

## 2013-04-27 ENCOUNTER — Ambulatory Visit (INDEPENDENT_AMBULATORY_CARE_PROVIDER_SITE_OTHER): Payer: Medicare Other | Admitting: *Deleted

## 2013-04-27 DIAGNOSIS — I429 Cardiomyopathy, unspecified: Secondary | ICD-10-CM

## 2013-04-27 DIAGNOSIS — L97509 Non-pressure chronic ulcer of other part of unspecified foot with unspecified severity: Secondary | ICD-10-CM | POA: Diagnosis not present

## 2013-04-27 DIAGNOSIS — E1169 Type 2 diabetes mellitus with other specified complication: Secondary | ICD-10-CM | POA: Diagnosis not present

## 2013-04-27 DIAGNOSIS — M869 Osteomyelitis, unspecified: Secondary | ICD-10-CM | POA: Diagnosis not present

## 2013-04-27 DIAGNOSIS — M9080 Osteopathy in diseases classified elsewhere, unspecified site: Secondary | ICD-10-CM | POA: Diagnosis not present

## 2013-04-27 LAB — MDC_IDC_ENUM_SESS_TYPE_INCLINIC
Implantable Pulse Generator Serial Number: 512614
Lead Channel Impedance Value: 744 Ohm
Lead Channel Pacing Threshold Amplitude: 1 V
Lead Channel Pacing Threshold Pulse Width: 0.4 ms
Lead Channel Pacing Threshold Pulse Width: 0.5 ms
Lead Channel Sensing Intrinsic Amplitude: 0.4 mV
MDC IDC MSMT LEADCHNL RA IMPEDANCE VALUE: 484 Ohm
MDC IDC MSMT LEADCHNL RV IMPEDANCE VALUE: 709 Ohm
MDC IDC MSMT LEADCHNL RV PACING THRESHOLD AMPLITUDE: 1 V
MDC IDC SET ZONE DETECTION INTERVAL: 342.8 ms
Zone Setting Detection Interval: 250 ms
Zone Setting Detection Interval: 300 ms

## 2013-04-27 NOTE — Progress Notes (Signed)
CRT-D device check in office. Thresholds and sensing consistent with previous device measurements. Lead impedance trends stable over time. 86% A-fib, + eliquis.  No ventricular arrhythmia episodes recorded. Patient bi-ventricularly pacing >99% of the time. Device programmed with appropriate safety margins. Heart failure diagnostics reviewed and trends are stable for patient. Audible/vibratory alerts demonstrated for patient. No changes made this session. Estimated longevity MOL 2.   Patient education completed including shock plan.  ROV 3 months with the device clinic in [place].

## 2013-04-28 ENCOUNTER — Ambulatory Visit (INDEPENDENT_AMBULATORY_CARE_PROVIDER_SITE_OTHER): Payer: Medicare Other | Admitting: Cardiology

## 2013-04-28 ENCOUNTER — Encounter: Payer: Self-pay | Admitting: Cardiology

## 2013-04-28 VITALS — BP 171/77 | HR 69 | Ht 67.0 in | Wt 249.5 lb

## 2013-04-28 DIAGNOSIS — I429 Cardiomyopathy, unspecified: Secondary | ICD-10-CM | POA: Diagnosis not present

## 2013-04-28 DIAGNOSIS — Z95810 Presence of automatic (implantable) cardiac defibrillator: Secondary | ICD-10-CM | POA: Diagnosis not present

## 2013-04-28 DIAGNOSIS — I4891 Unspecified atrial fibrillation: Secondary | ICD-10-CM

## 2013-04-28 DIAGNOSIS — I2510 Atherosclerotic heart disease of native coronary artery without angina pectoris: Secondary | ICD-10-CM | POA: Diagnosis not present

## 2013-04-28 NOTE — Progress Notes (Signed)
Clinical Summary [name] [name] is a 68 y.o.female last seen in April 2014. She had interval followup with Dr. Ladona Ridgel in June 2014 and was diagnosed with atrial fibrillation at that time with device interrogation. She was started on Eliquis is followed through the anticoagulation clinic.  Lab work in December showed potassium 4.5, BUN 17, creatinine 1.0, hemoglobin 11.8, platelets 340.  Echocardiogram from October 2014 showed improvement in LVEF to the range of 40-45%, increased left atrial pressure, mildly thickened aortic leaflets, mild mitral regurgitation, severe left atrial enlargement, moderately dilated right ventricle with device wire in place.  She denies any palpitations, has had stable dyspnea on exertion, no chest pain symptoms. States she has lost 5 pounds.   Allergies  Allergen Reactions  . Celebrex [Celecoxib]     Current Outpatient Prescriptions  Medication Sig Dispense Refill  . ALPRAZolam (XANAX) 1 MG tablet TAKE 1 TABLET UPTO 2 TIMES A DAY  60 tablet  5  . apixaban (ELIQUIS) 5 MG TABS tablet Take 1 tablet (5 mg total) by mouth 2 (two) times daily.  60 tablet  6  . carvedilol (COREG) 25 MG tablet Take 1 tablet (25 mg total) by mouth 2 (two) times daily with a meal.  60 tablet  4  . enalapril (VASOTEC) 10 MG tablet Take 1 tablet (10 mg total) by mouth 2 (two) times daily.  60 tablet  4  . fenofibrate 160 MG tablet TAKE ONE (1) TABLET AT BEDTIME  30 tablet  2  . furosemide (LASIX) 40 MG tablet Take 1 tablet (40 mg total) by mouth 2 (two) times daily.  60 tablet  6  . insulin aspart (NOVOLOG) 100 UNIT/ML injection Inject 15-40 Units into the skin 3 (three) times daily before meals. Sliding Scale      . insulin glargine (LANTUS) 100 UNIT/ML injection Inject into the skin 2 (two) times daily. 50 units in the am and 60 units in the pm      . levothyroxine (SYNTHROID, LEVOTHROID) 100 MCG tablet Take 100 mcg by mouth daily.        . Liraglutide (VICTOZA [place]) Inject 1.8 Units  into the skin as directed.       . multivitamin (THERAGRAN) per tablet Take 1 tablet by mouth daily.        . Omega-3 Fatty Acids (FISH OIL) 1000 MG CAPS Take 1,000 mg by mouth daily.      . potassium chloride SA (K-DUR,KLOR-CON) 20 MEQ tablet Take 1 tablet (20 mEq total) by mouth 2 (two) times daily.  60 tablet  4  . SANTYL ointment       . simvastatin (ZOCOR) 80 MG tablet Take 1 tablet (80 mg total) by mouth at bedtime.  30 tablet  6  . vitamin E 600 UNIT capsule Take 600 Units by mouth daily.         No current facility-administered medications for this visit.    Past Medical History  Diagnosis Date  . Chronic systolic heart failure   . Diabetes mellitus, type 2   . Hyperlipidemia   . Essential hypertension, benign   . Hypothyroidism   . PVD (peripheral vascular disease)   . Coronary atherosclerosis of native coronary artery     Multivessel, patent grafts 2007  . Ischemic cardiomyopathy     Ejection Fraction of 25% improved to 45-50% following biventicular pacing, Status post biventricular dual-chamber ICD - [employer]  . Hypothyroidism   . Cardiac defibrillator in place   .  Colon polyps     20 yrs ago  . Ulcer     Past Surgical History  Procedure Laterality Date  . Coronary artery bypass graft  2001    [facility] - LIMA to LAD, SVG to ramus, SVG to OM/PLB, SVG to RCA   . Appendectomy    . Bilateral tubal ligation    . Cardiac defibrillator placement      [employer] [facility]  . Flexible sigmoidoscopy  07/19/1998    Dr. Karilyn Cotaehman  . Tonsillectomy    . Cesarean section      Social History Ms. Bellin reports that she quit smoking about 21 years ago. She has never used smokeless tobacco. Ms. Berke reports that she does not drink alcohol.  Review of Systems No palpitations, no device discharges, no reported bleeding episodes. Did have toe amputation on left foot and has had chronic problems with healing on that side - had hyperbaric treatment.  Otherwise negative.  Physical Examination Filed Vitals:   04/28/13 1559  BP: 171/77  Pulse: 69   Filed Weights   04/28/13 1559  Weight: 249 lb 8 oz (113.172 kg)    Obese woman in NAD.  HEENT: Conjunctiva and lids normal, oropharynx clear with moist mucosa.  Neck: Supple, increased girth, no carotid bruits, no thyromegaly.  Lungs: Clear to auscultation, nonlabored breathing at rest.  Cardiac: Regular rate and rhythm, no S3, no pericardial rub.  Abdomen: Soft, nontender, bowel sounds present, no guarding or rebound.  Extremities: No pitting edema, distal pulses 1-2+. Orthopedic support shoe on left. Skin: Warm and dry.  Musculoskeletal: No kyphosis.  Neuropsychiatric: Alert and oriented x3, affect grossly appropriate.   Problem List and Plan   Coronary atherosclerosis of native coronary artery Multivessel disease status post previous CABG in 2001. Patient has been symptomatically stable without progressive angina.  Secondary cardiomyopathy, unspecified LVEF improved the range of 45-50% by most recent assessment, status post biventricular ICD.  ICD-[employer] Followed by Dr. Ladona Ridgelaylor.  Atrial fibrillation Diagnosed by Dr. Ladona Ridgelaylor at device interrogation, now on Eliquis. Recent lab work reviewed. She is followed in the anticoagulation clinic.    Jonelle SidleSamuel G. McDowell, M.D., F.A.C.C.

## 2013-04-28 NOTE — Patient Instructions (Addendum)
Your physician wants you to follow-up in: 6 MONTHS You will receive a reminder letter in the mail two months in advance. If you don't receive a letter, please call our office to schedule the follow-up appointment. 

## 2013-04-28 NOTE — Assessment & Plan Note (Signed)
Followed by Dr. Taylor 

## 2013-04-28 NOTE — Assessment & Plan Note (Signed)
LVEF improved the range of 45-50% by most recent assessment, status post biventricular ICD.

## 2013-04-28 NOTE — Assessment & Plan Note (Signed)
Multivessel disease status post previous CABG in 2001. Patient has been symptomatically stable without progressive angina.

## 2013-04-28 NOTE — Assessment & Plan Note (Signed)
Diagnosed by Dr. Ladona Ridgelaylor at device interrogation, now on Eliquis. Recent lab work reviewed. She is followed in the anticoagulation clinic.

## 2013-05-04 DIAGNOSIS — M9080 Osteopathy in diseases classified elsewhere, unspecified site: Secondary | ICD-10-CM | POA: Diagnosis not present

## 2013-05-04 DIAGNOSIS — L97509 Non-pressure chronic ulcer of other part of unspecified foot with unspecified severity: Secondary | ICD-10-CM | POA: Diagnosis not present

## 2013-05-04 DIAGNOSIS — E1169 Type 2 diabetes mellitus with other specified complication: Secondary | ICD-10-CM | POA: Diagnosis not present

## 2013-05-04 DIAGNOSIS — M869 Osteomyelitis, unspecified: Secondary | ICD-10-CM | POA: Diagnosis not present

## 2013-05-12 ENCOUNTER — Encounter: Payer: Self-pay | Admitting: Internal Medicine

## 2013-05-18 ENCOUNTER — Encounter (HOSPITAL_BASED_OUTPATIENT_CLINIC_OR_DEPARTMENT_OTHER): Payer: Medicare Other | Attending: Internal Medicine

## 2013-05-18 DIAGNOSIS — L97509 Non-pressure chronic ulcer of other part of unspecified foot with unspecified severity: Secondary | ICD-10-CM | POA: Diagnosis not present

## 2013-05-18 DIAGNOSIS — M869 Osteomyelitis, unspecified: Secondary | ICD-10-CM | POA: Insufficient documentation

## 2013-05-18 DIAGNOSIS — E1169 Type 2 diabetes mellitus with other specified complication: Secondary | ICD-10-CM | POA: Insufficient documentation

## 2013-05-25 DIAGNOSIS — E1169 Type 2 diabetes mellitus with other specified complication: Secondary | ICD-10-CM | POA: Diagnosis not present

## 2013-05-25 DIAGNOSIS — M869 Osteomyelitis, unspecified: Secondary | ICD-10-CM | POA: Diagnosis not present

## 2013-05-25 DIAGNOSIS — L97509 Non-pressure chronic ulcer of other part of unspecified foot with unspecified severity: Secondary | ICD-10-CM | POA: Diagnosis not present

## 2013-06-07 DIAGNOSIS — E559 Vitamin D deficiency, unspecified: Secondary | ICD-10-CM | POA: Diagnosis not present

## 2013-06-07 DIAGNOSIS — E669 Obesity, unspecified: Secondary | ICD-10-CM | POA: Diagnosis not present

## 2013-06-07 DIAGNOSIS — E785 Hyperlipidemia, unspecified: Secondary | ICD-10-CM | POA: Diagnosis not present

## 2013-06-07 DIAGNOSIS — E039 Hypothyroidism, unspecified: Secondary | ICD-10-CM | POA: Diagnosis not present

## 2013-06-07 DIAGNOSIS — E1165 Type 2 diabetes mellitus with hyperglycemia: Secondary | ICD-10-CM | POA: Diagnosis not present

## 2013-06-07 DIAGNOSIS — I10 Essential (primary) hypertension: Secondary | ICD-10-CM | POA: Diagnosis not present

## 2013-06-07 DIAGNOSIS — E1129 Type 2 diabetes mellitus with other diabetic kidney complication: Secondary | ICD-10-CM | POA: Diagnosis not present

## 2013-06-14 DIAGNOSIS — IMO0001 Reserved for inherently not codable concepts without codable children: Secondary | ICD-10-CM | POA: Diagnosis not present

## 2013-06-14 DIAGNOSIS — E669 Obesity, unspecified: Secondary | ICD-10-CM | POA: Diagnosis not present

## 2013-06-14 DIAGNOSIS — I10 Essential (primary) hypertension: Secondary | ICD-10-CM | POA: Diagnosis not present

## 2013-06-14 DIAGNOSIS — E785 Hyperlipidemia, unspecified: Secondary | ICD-10-CM | POA: Diagnosis not present

## 2013-06-14 DIAGNOSIS — E039 Hypothyroidism, unspecified: Secondary | ICD-10-CM | POA: Diagnosis not present

## 2013-06-15 ENCOUNTER — Encounter (HOSPITAL_BASED_OUTPATIENT_CLINIC_OR_DEPARTMENT_OTHER): Payer: Medicare Other | Attending: Internal Medicine

## 2013-06-15 DIAGNOSIS — I70219 Atherosclerosis of native arteries of extremities with intermittent claudication, unspecified extremity: Secondary | ICD-10-CM | POA: Insufficient documentation

## 2013-06-15 DIAGNOSIS — E1169 Type 2 diabetes mellitus with other specified complication: Secondary | ICD-10-CM | POA: Insufficient documentation

## 2013-06-15 DIAGNOSIS — L97509 Non-pressure chronic ulcer of other part of unspecified foot with unspecified severity: Secondary | ICD-10-CM | POA: Insufficient documentation

## 2013-06-15 DIAGNOSIS — L03119 Cellulitis of unspecified part of limb: Secondary | ICD-10-CM

## 2013-06-15 DIAGNOSIS — M86679 Other chronic osteomyelitis, unspecified ankle and foot: Secondary | ICD-10-CM | POA: Insufficient documentation

## 2013-06-15 DIAGNOSIS — M9080 Osteopathy in diseases classified elsewhere, unspecified site: Secondary | ICD-10-CM | POA: Insufficient documentation

## 2013-06-15 DIAGNOSIS — L02619 Cutaneous abscess of unspecified foot: Secondary | ICD-10-CM | POA: Insufficient documentation

## 2013-06-22 DIAGNOSIS — E1169 Type 2 diabetes mellitus with other specified complication: Secondary | ICD-10-CM | POA: Diagnosis not present

## 2013-06-22 DIAGNOSIS — I70219 Atherosclerosis of native arteries of extremities with intermittent claudication, unspecified extremity: Secondary | ICD-10-CM | POA: Diagnosis not present

## 2013-06-22 DIAGNOSIS — L97509 Non-pressure chronic ulcer of other part of unspecified foot with unspecified severity: Secondary | ICD-10-CM | POA: Diagnosis not present

## 2013-06-22 DIAGNOSIS — M9080 Osteopathy in diseases classified elsewhere, unspecified site: Secondary | ICD-10-CM | POA: Diagnosis not present

## 2013-06-22 DIAGNOSIS — M86679 Other chronic osteomyelitis, unspecified ankle and foot: Secondary | ICD-10-CM | POA: Diagnosis not present

## 2013-06-22 DIAGNOSIS — L02619 Cutaneous abscess of unspecified foot: Secondary | ICD-10-CM | POA: Diagnosis not present

## 2013-06-29 DIAGNOSIS — M9080 Osteopathy in diseases classified elsewhere, unspecified site: Secondary | ICD-10-CM | POA: Diagnosis not present

## 2013-06-29 DIAGNOSIS — L97509 Non-pressure chronic ulcer of other part of unspecified foot with unspecified severity: Secondary | ICD-10-CM | POA: Diagnosis not present

## 2013-06-29 DIAGNOSIS — E1169 Type 2 diabetes mellitus with other specified complication: Secondary | ICD-10-CM | POA: Diagnosis not present

## 2013-06-29 DIAGNOSIS — I70219 Atherosclerosis of native arteries of extremities with intermittent claudication, unspecified extremity: Secondary | ICD-10-CM | POA: Diagnosis not present

## 2013-07-05 ENCOUNTER — Other Ambulatory Visit: Payer: Self-pay | Admitting: *Deleted

## 2013-07-05 ENCOUNTER — Ambulatory Visit (HOSPITAL_COMMUNITY)
Admission: RE | Admit: 2013-07-05 | Discharge: 2013-07-05 | Disposition: A | Discharge status: Home / Self Care | Payer: Medicare Other | Source: Ambulatory Visit | Attending: Vascular Surgery | Admitting: Vascular Surgery

## 2013-07-05 ENCOUNTER — Other Ambulatory Visit (HOSPITAL_BASED_OUTPATIENT_CLINIC_OR_DEPARTMENT_OTHER): Payer: Self-pay | Admitting: Internal Medicine

## 2013-07-05 DIAGNOSIS — I739 Peripheral vascular disease, unspecified: Secondary | ICD-10-CM

## 2013-07-05 DIAGNOSIS — L97509 Non-pressure chronic ulcer of other part of unspecified foot with unspecified severity: Secondary | ICD-10-CM | POA: Diagnosis not present

## 2013-07-05 DIAGNOSIS — I70219 Atherosclerosis of native arteries of extremities with intermittent claudication, unspecified extremity: Secondary | ICD-10-CM | POA: Diagnosis not present

## 2013-07-05 DIAGNOSIS — L98499 Non-pressure chronic ulcer of skin of other sites with unspecified severity: Principal | ICD-10-CM

## 2013-07-05 DIAGNOSIS — M9080 Osteopathy in diseases classified elsewhere, unspecified site: Secondary | ICD-10-CM | POA: Diagnosis not present

## 2013-07-05 DIAGNOSIS — E1169 Type 2 diabetes mellitus with other specified complication: Secondary | ICD-10-CM | POA: Diagnosis not present

## 2013-07-10 ENCOUNTER — Encounter: Payer: Self-pay | Admitting: Vascular Surgery

## 2013-07-11 ENCOUNTER — Encounter (INDEPENDENT_AMBULATORY_CARE_PROVIDER_SITE_OTHER): Payer: Self-pay

## 2013-07-11 ENCOUNTER — Other Ambulatory Visit: Payer: Self-pay | Admitting: *Deleted

## 2013-07-11 ENCOUNTER — Ambulatory Visit (INDEPENDENT_AMBULATORY_CARE_PROVIDER_SITE_OTHER): Payer: Medicare Other | Admitting: Vascular Surgery

## 2013-07-11 ENCOUNTER — Encounter: Payer: Self-pay | Admitting: Vascular Surgery

## 2013-07-11 ENCOUNTER — Ambulatory Visit (HOSPITAL_COMMUNITY)
Admission: RE | Admit: 2013-07-11 | Discharge: 2013-07-11 | Disposition: A | Discharge status: Home / Self Care | Payer: Medicare Other | Source: Ambulatory Visit | Attending: Vascular Surgery | Admitting: Vascular Surgery

## 2013-07-11 VITALS — BP 154/55 | HR 70 | Ht 67.0 in | Wt 252.0 lb

## 2013-07-11 DIAGNOSIS — I739 Peripheral vascular disease, unspecified: Secondary | ICD-10-CM | POA: Diagnosis not present

## 2013-07-11 DIAGNOSIS — I7025 Atherosclerosis of native arteries of other extremities with ulceration: Secondary | ICD-10-CM | POA: Insufficient documentation

## 2013-07-11 DIAGNOSIS — L98499 Non-pressure chronic ulcer of skin of other sites with unspecified severity: Secondary | ICD-10-CM | POA: Diagnosis not present

## 2013-07-11 NOTE — Progress Notes (Signed)
Subjective:     Patient ID: [name] [name], female   DOB: October 17, 1945, 68 y.o.   MRN: [id number]  HPI this 68 year old female who was previously evaluated by Dr. Leonette Mostharles fields  and 2010 4 severe claudication in both legs but had no limb threatening ischemia. Last fall she had her second toe amputated by Dr. Ulice Brilliantrake and is healed nicely but the compression dressing according to the patient caused an ulceration on the lateral aspect of her left foot. This has not healed but she has been attending the wound center for the past 6 months. This causes difficulty in her ambulation. She denies any ulcerations in the contralateral right leg.  Past Medical History  Diagnosis Date  . Chronic systolic heart failure   . Diabetes mellitus, type 2   . Hyperlipidemia   . Essential hypertension, benign   . Hypothyroidism   . PVD (peripheral vascular disease)   . Coronary atherosclerosis of native coronary artery     Multivessel, patent grafts 2007  . Ischemic cardiomyopathy     Ejection Fraction of 25% improved to 45-50% following biventicular pacing, Status post biventricular dual-chamber ICD - [employer] Livian H22  . Hypothyroidism   . Cardiac defibrillator in place   . Colon polyps     20 yrs ago  . Ulcer   . CHF (congestive heart failure)   . CAD (coronary artery disease)   . Myocardial infarction     History  Substance Use Topics  . Smoking status: Former Smoker    Quit date: 09/16/1991  . Smokeless tobacco: Never Used  . Alcohol Use: No    Family History  Problem Relation Age of Onset  . Diabetes Mother   . Ulcers Mother   . Heart disease Mother     before age 66  . Hyperlipidemia Mother   . Hypertension Mother   . Diabetes Sister   . Heart disease Sister     before age 66  . Heart attack Sister   . Diabetes Father   . Heart attack Father   . Hyperlipidemia Father   . Hypertension Father   . Heart disease Father     before age 66  . Coronary artery disease Sister [id number]     Allergies  Allergen Reactions  . Celebrex [Celecoxib]     Current outpatient prescriptions:ALPRAZolam (XANAX) 1 MG tablet, TAKE 1 TABLET UPTO 2 TIMES A DAY, Disp: 60 tablet, Rfl: 5;  apixaban (ELIQUIS) 5 MG TABS tablet, Take 1 tablet (5 mg total) by mouth 2 (two) times daily., Disp: 60 tablet, Rfl: 6;  carvedilol (COREG) 25 MG tablet, Take 1 tablet (25 mg total) by mouth 2 (two) times daily with a meal., Disp: 60 tablet, Rfl: 4 enalapril (VASOTEC) 10 MG tablet, Take 1 tablet (10 mg total) by mouth 2 (two) times daily., Disp: 60 tablet, Rfl: 4;  fenofibrate 160 MG tablet, TAKE ONE (1) TABLET AT BEDTIME, Disp: 30 tablet, Rfl: 2;  furosemide (LASIX) 40 MG tablet, Take 1 tablet (40 mg total) by mouth 2 (two) times daily., Disp: 60 tablet, Rfl: 6 insulin aspart (NOVOLOG) 100 UNIT/ML injection, Inject 15-40 Units into the skin 3 (three) times daily before meals. Sliding Scale, Disp: , Rfl: ;  insulin glargine (LANTUS) 100 UNIT/ML injection, Inject into the skin 2 (two) times daily. 50 units in the am and 60 units in the pm, Disp: , Rfl: ;  levothyroxine (SYNTHROID, LEVOTHROID) 100 MCG tablet, Take 100 mcg by mouth daily.  , Disp: ,  Rfl:  Liraglutide (VICTOZA [place]), Inject 1.8 Units into the skin as directed. , Disp: , Rfl: ;  multivitamin (THERAGRAN) per tablet, Take 1 tablet by mouth daily.  , Disp: , Rfl: ;  Omega-3 Fatty Acids (FISH OIL) 1000 MG CAPS, Take 1,000 mg by mouth daily., Disp: , Rfl: ;  potassium chloride SA (K-DUR,KLOR-CON) 20 MEQ tablet, Take 1 tablet (20 mEq total) by mouth 2 (two) times daily., Disp: 60 tablet, Rfl: 4;  SANTYL ointment, , Disp: , Rfl:  simvastatin (ZOCOR) 80 MG tablet, Take 1 tablet (80 mg total) by mouth at bedtime., Disp: 30 tablet, Rfl: 6;  vitamin E 600 UNIT capsule, Take 600 Units by mouth daily.  , Disp: , Rfl:   BP 154/55  Pulse 70  Ht 5\' 7"  (1.702 m)  Wt 252 lb (114.306 kg)  BMI 39.46 kg/m2  SpO2 98%  Body mass index is 39.46  kg/(m^2).           Review of Systems patient has morbid obesity, does not ambulate long distances, has chronic edema, and previous coronary artery disease and coronary artery bypass grafting in 2009. Denies chest pain, dyspnea on exertion, PND, orthopnea, hemoptysis, other systems negative and complete review of systems    Objective:   Physical Exam BP 154/55  Pulse 70  Ht 5\' 7"  (1.702 m)  Wt 252 lb (114.306 kg)  BMI 39.46 kg/m2  SpO2 98%  Gen.-alert and oriented x3 in no apparent distress-morbidly obese HEENT normal for age Lungs no rhonchi or wheezing Cardiovascular regular rhythm no murmurs carotid pulses 3+ palpable no bruits audible Abdomen soft nontender no palpable masses-morbidly obese-large panniculus Musculoskeletal free of  major deformities Skin clear -no rashes Neurologic normal Lower extremities 3+ femoral pulses bilaterally. Patient has large panniculus and inguinal region bilaterally. No popliteal or distal pulses palpable.  Left foot with ulceration over the metatarsal bone-fifth measuring 1 cm in diameter. Second toe amputation well healed all other toes intact.  Today I ordered lower extremity arterial Doppler exam which are viewed and interpret. Patient has an ABI of 0.58 on the right and 0.44 on the left which is very similar to that obtained by Dr. fields in 2010.        Assessment:     Nonhealing ulcer left foot in patient with diabetes mellitus and lower extremity occlusive disease with ABI of 0.44 Patient has morbid obesity Patient has an implantable pacemaker and is on Eliquis daily    Plan:     Plan aortobifemoral angiography with possible intervention or bypass by Dr. Darrick Pennafields on Friday, April 10-will hold Eliquis after April 4 dose Patient has likely had left saphenous vein removed for coronary artery bypass grafting

## 2013-07-13 ENCOUNTER — Encounter (HOSPITAL_BASED_OUTPATIENT_CLINIC_OR_DEPARTMENT_OTHER): Payer: Medicare Other | Attending: Internal Medicine

## 2013-07-13 DIAGNOSIS — E1169 Type 2 diabetes mellitus with other specified complication: Secondary | ICD-10-CM | POA: Diagnosis not present

## 2013-07-13 DIAGNOSIS — L97509 Non-pressure chronic ulcer of other part of unspecified foot with unspecified severity: Secondary | ICD-10-CM | POA: Insufficient documentation

## 2013-07-14 ENCOUNTER — Encounter (HOSPITAL_COMMUNITY): Payer: Self-pay | Admitting: Pharmacy Technician

## 2013-07-15 ENCOUNTER — Other Ambulatory Visit: Payer: Self-pay | Admitting: Cardiology

## 2013-07-15 ENCOUNTER — Other Ambulatory Visit: Payer: Self-pay | Admitting: Internal Medicine

## 2013-08-03 DIAGNOSIS — E1169 Type 2 diabetes mellitus with other specified complication: Secondary | ICD-10-CM | POA: Diagnosis not present

## 2013-08-03 DIAGNOSIS — L97509 Non-pressure chronic ulcer of other part of unspecified foot with unspecified severity: Secondary | ICD-10-CM | POA: Diagnosis not present

## 2013-08-04 ENCOUNTER — Telehealth: Payer: Self-pay | Admitting: Vascular Surgery

## 2013-08-04 ENCOUNTER — Other Ambulatory Visit: Payer: Self-pay | Admitting: *Deleted

## 2013-08-04 ENCOUNTER — Ambulatory Visit (HOSPITAL_COMMUNITY)
Admission: RE | Admit: 2013-08-04 | Discharge: 2013-08-04 | Disposition: A | Discharge status: Home / Self Care | Payer: Medicare Other | Source: Ambulatory Visit | Attending: Vascular Surgery | Admitting: Vascular Surgery

## 2013-08-04 ENCOUNTER — Encounter (HOSPITAL_COMMUNITY)
Admission: RE | Disposition: A | Discharge status: Home / Self Care | Payer: Self-pay | Source: Ambulatory Visit | Attending: Vascular Surgery

## 2013-08-04 DIAGNOSIS — I10 Essential (primary) hypertension: Secondary | ICD-10-CM | POA: Insufficient documentation

## 2013-08-04 DIAGNOSIS — Z01810 Encounter for preprocedural cardiovascular examination: Secondary | ICD-10-CM

## 2013-08-04 DIAGNOSIS — I2589 Other forms of chronic ischemic heart disease: Secondary | ICD-10-CM | POA: Diagnosis not present

## 2013-08-04 DIAGNOSIS — Z794 Long term (current) use of insulin: Secondary | ICD-10-CM | POA: Insufficient documentation

## 2013-08-04 DIAGNOSIS — Z6839 Body mass index (BMI) 39.0-39.9, adult: Secondary | ICD-10-CM | POA: Insufficient documentation

## 2013-08-04 DIAGNOSIS — Z860100 Personal history of colon polyps, unspecified: Secondary | ICD-10-CM | POA: Insufficient documentation

## 2013-08-04 DIAGNOSIS — I5022 Chronic systolic (congestive) heart failure: Secondary | ICD-10-CM | POA: Insufficient documentation

## 2013-08-04 DIAGNOSIS — I2510 Atherosclerotic heart disease of native coronary artery without angina pectoris: Secondary | ICD-10-CM | POA: Diagnosis not present

## 2013-08-04 DIAGNOSIS — I739 Peripheral vascular disease, unspecified: Secondary | ICD-10-CM

## 2013-08-04 DIAGNOSIS — E119 Type 2 diabetes mellitus without complications: Secondary | ICD-10-CM | POA: Diagnosis not present

## 2013-08-04 DIAGNOSIS — I252 Old myocardial infarction: Secondary | ICD-10-CM | POA: Diagnosis not present

## 2013-08-04 DIAGNOSIS — L97509 Non-pressure chronic ulcer of other part of unspecified foot with unspecified severity: Secondary | ICD-10-CM | POA: Diagnosis present

## 2013-08-04 DIAGNOSIS — E785 Hyperlipidemia, unspecified: Secondary | ICD-10-CM | POA: Diagnosis not present

## 2013-08-04 DIAGNOSIS — Z7901 Long term (current) use of anticoagulants: Secondary | ICD-10-CM | POA: Diagnosis not present

## 2013-08-04 DIAGNOSIS — I708 Atherosclerosis of other arteries: Secondary | ICD-10-CM | POA: Insufficient documentation

## 2013-08-04 DIAGNOSIS — E039 Hypothyroidism, unspecified: Secondary | ICD-10-CM | POA: Insufficient documentation

## 2013-08-04 DIAGNOSIS — L98499 Non-pressure chronic ulcer of skin of other sites with unspecified severity: Secondary | ICD-10-CM | POA: Insufficient documentation

## 2013-08-04 DIAGNOSIS — Z95810 Presence of automatic (implantable) cardiac defibrillator: Secondary | ICD-10-CM | POA: Insufficient documentation

## 2013-08-04 DIAGNOSIS — I509 Heart failure, unspecified: Secondary | ICD-10-CM | POA: Insufficient documentation

## 2013-08-04 DIAGNOSIS — Z87891 Personal history of nicotine dependence: Secondary | ICD-10-CM | POA: Insufficient documentation

## 2013-08-04 HISTORY — PX: ABDOMINAL AORTAGRAM: SHX5454

## 2013-08-04 LAB — POCT I-STAT, CHEM 8
BUN: 20 mg/dL (ref 6–23)
CHLORIDE: 104 meq/L (ref 96–112)
CREATININE: 0.8 mg/dL (ref 0.50–1.10)
Calcium, Ion: 1.23 mmol/L (ref 1.13–1.30)
Glucose, Bld: 114 mg/dL — ABNORMAL HIGH (ref 70–99)
HEMATOCRIT: 39 % (ref 36.0–46.0)
HEMOGLOBIN: 13.3 g/dL (ref 12.0–15.0)
POTASSIUM: 4 meq/L (ref 3.7–5.3)
Sodium: 143 mEq/L (ref 137–147)
TCO2: 23 mmol/L (ref 0–100)

## 2013-08-04 LAB — POCT ACTIVATED CLOTTING TIME
Activated Clotting Time: 160 seconds
Activated Clotting Time: 193 seconds

## 2013-08-04 LAB — GLUCOSE, CAPILLARY
GLUCOSE-CAPILLARY: 131 mg/dL — AB (ref 70–99)
GLUCOSE-CAPILLARY: 145 mg/dL — AB (ref 70–99)
Glucose-Capillary: 107 mg/dL — ABNORMAL HIGH (ref 70–99)

## 2013-08-04 SURGERY — ABDOMINAL AORTAGRAM
Anesthesia: LOCAL

## 2013-08-04 MED ORDER — LIDOCAINE HCL (PF) 1 % IJ SOLN
INTRAMUSCULAR | Status: AC
  Filled 2013-08-04: qty 30

## 2013-08-04 MED ORDER — SODIUM CHLORIDE 0.9 % IV SOLN: INTRAVENOUS | Status: DC

## 2013-08-04 MED ORDER — ACETAMINOPHEN 325 MG RE SUPP: 325.0000 mg | RECTAL | Status: DC | PRN

## 2013-08-04 MED ORDER — HEPARIN SODIUM (PORCINE) 1000 UNIT/ML IJ SOLN
INTRAMUSCULAR | Status: AC
  Filled 2013-08-04: qty 1

## 2013-08-04 MED ORDER — DOCUSATE SODIUM 100 MG PO CAPS: 100.0000 mg | ORAL_CAPSULE | Freq: Every day | ORAL | Status: DC

## 2013-08-04 MED ORDER — ASPIRIN 325 MG PO TABS: 325.0000 mg | ORAL_TABLET | Freq: Every day | ORAL | Status: DC

## 2013-08-04 MED ORDER — ASPIRIN 81 MG PO CHEW
CHEWABLE_TABLET | ORAL | Status: AC
  Filled 2013-08-04: qty 1

## 2013-08-04 MED ORDER — HYDRALAZINE HCL 20 MG/ML IJ SOLN: 10.0000 mg | INTRAMUSCULAR | Status: DC | PRN

## 2013-08-04 MED ORDER — HEPARIN (PORCINE) IN NACL 2-0.9 UNIT/ML-% IJ SOLN
INTRAMUSCULAR | Status: AC
  Filled 2013-08-04: qty 1000

## 2013-08-04 MED ORDER — MORPHINE SULFATE 10 MG/ML IJ SOLN: 2.0000 mg | INTRAMUSCULAR | Status: DC | PRN

## 2013-08-04 MED ORDER — ONDANSETRON HCL 4 MG/2ML IJ SOLN: 4.0000 mg | Freq: Four times a day (QID) | INTRAMUSCULAR | Status: DC | PRN

## 2013-08-04 MED ORDER — ACETAMINOPHEN 325 MG PO TABS: 325.0000 mg | ORAL_TABLET | ORAL | Status: DC | PRN

## 2013-08-04 MED ORDER — SODIUM CHLORIDE 0.45 % IV SOLN
INTRAVENOUS | Status: DC
  Administered 2013-08-04: 09:00:00 via INTRAVENOUS

## 2013-08-04 MED ORDER — LABETALOL HCL 5 MG/ML IV SOLN: 10.0000 mg | INTRAVENOUS | Status: DC | PRN

## 2013-08-04 MED ORDER — GUAIFENESIN-DM 100-10 MG/5ML PO SYRP: 15.0000 mL | ORAL_SOLUTION | ORAL | Status: DC | PRN

## 2013-08-04 MED ORDER — METOPROLOL TARTRATE 1 MG/ML IV SOLN: 2.0000 mg | INTRAVENOUS | Status: DC | PRN

## 2013-08-04 MED ORDER — OXYCODONE HCL 5 MG PO TABS: 5.0000 mg | ORAL_TABLET | ORAL | Status: DC | PRN

## 2013-08-04 MED ORDER — ASPIRIN 81 MG PO CHEW
81.0000 mg | CHEWABLE_TABLET | Freq: Once | ORAL | Status: AC
  Administered 2013-08-04: 81 mg via ORAL

## 2013-08-04 MED ORDER — PHENOL 1.4 % MT LIQD: 1.0000 | OROMUCOSAL | Status: DC | PRN

## 2013-08-04 MED ORDER — ASPIRIN EC 81 MG PO TBEC: 81.0000 mg | DELAYED_RELEASE_TABLET | Freq: Every day | ORAL | Status: DC

## 2013-08-04 NOTE — Interval H&P Note (Signed)
History and Physical Interval Note:  08/04/2013 7:38 AM  [name] [name]  has presented today for surgery, with the diagnosis of non healing ulcer left leg  The various methods of treatment have been discussed with the patient and family. After consideration of risks, benefits and other options for treatment, the patient has consented to  Procedure(s): ABDOMINAL AORTAGRAM (N/A) as a surgical intervention .  The patient's history has been reviewed, patient examined, no change in status, stable for surgery.  I have reviewed the patient's chart and labs.  Questions were answered to the patient's satisfaction.     Sherren Kernsharles E Christoper Bushey

## 2013-08-04 NOTE — Telephone Encounter (Addendum)
Message copied by ROUX, BONNIE A on Fri Aug 04, 2013 12:13 PM ------ [name] [name]     Message from: Sharee PimpleMCCHESNEY, MARILYN K      Created: Fri Aug 04, 2013  9:53 AM      Regarding: Schedule                   ----- Message -----         From: Sherren Kernsharles E Fields, MD         Sent: 08/04/2013   9:18 AM           To: [facility] Charge Pool            Aortogram with bilat runoff      First order cath left iliac      Left iliac stent            She needs a follow up visit with me in one month.  At that time she needs bilateral ABIs with toe pressure.  She also needs left leg vein mapping.            Fabienne Brunsharles Fields ------  notified patient of fu appt. with dr. Darrick Pennafields on 09-14-13 at 1pm

## 2013-08-04 NOTE — H&P (View-Only) (Signed)
Subjective:     Patient ID: [name] [name], female   DOB: 07/14/1945, 68 y.o.   MRN: [id number]  HPI this 68-year-old female who was previously evaluated by Dr. Charles fields  and 2010 4 severe claudication in both legs but had no limb threatening ischemia. Last fall she had her second toe amputated by Dr. Drake and is healed nicely but the compression dressing according to the patient caused an ulceration on the lateral aspect of her left foot. This has not healed but she has been attending the wound center for the past 6 months. This causes difficulty in her ambulation. She denies any ulcerations in the contralateral right leg.  Past Medical History  Diagnosis Date  . Chronic systolic heart failure   . Diabetes mellitus, type 2   . Hyperlipidemia   . Essential hypertension, benign   . Hypothyroidism   . PVD (peripheral vascular disease)   . Coronary atherosclerosis of native coronary artery     Multivessel, patent grafts 2007  . Ischemic cardiomyopathy     Ejection Fraction of 25% improved to 45-50% following biventicular pacing, Status post biventricular dual-chamber ICD - [employer] Livian H22  . Hypothyroidism   . Cardiac defibrillator in place   . Colon polyps     20 yrs ago  . Ulcer   . CHF (congestive heart failure)   . CAD (coronary artery disease)   . Myocardial infarction     History  Substance Use Topics  . Smoking status: Former Smoker    Quit date: 09/16/1991  . Smokeless tobacco: Never Used  . Alcohol Use: No    Family History  Problem Relation Age of Onset  . Diabetes Mother   . Ulcers Mother   . Heart disease Mother     before age 60  . Hyperlipidemia Mother   . Hypertension Mother   . Diabetes Sister   . Heart disease Sister     before age 60  . Heart attack Sister   . Diabetes Father   . Heart attack Father   . Hyperlipidemia Father   . Hypertension Father   . Heart disease Father     before age 60  . Coronary artery disease Sister 50     Allergies  Allergen Reactions  . Celebrex [Celecoxib]     Current outpatient prescriptions:ALPRAZolam (XANAX) 1 MG tablet, TAKE 1 TABLET UPTO 2 TIMES A DAY, Disp: 60 tablet, Rfl: 5;  apixaban (ELIQUIS) 5 MG TABS tablet, Take 1 tablet (5 mg total) by mouth 2 (two) times daily., Disp: 60 tablet, Rfl: 6;  carvedilol (COREG) 25 MG tablet, Take 1 tablet (25 mg total) by mouth 2 (two) times daily with a meal., Disp: 60 tablet, Rfl: 4 enalapril (VASOTEC) 10 MG tablet, Take 1 tablet (10 mg total) by mouth 2 (two) times daily., Disp: 60 tablet, Rfl: 4;  fenofibrate 160 MG tablet, TAKE ONE (1) TABLET AT BEDTIME, Disp: 30 tablet, Rfl: 2;  furosemide (LASIX) 40 MG tablet, Take 1 tablet (40 mg total) by mouth 2 (two) times daily., Disp: 60 tablet, Rfl: 6 insulin aspart (NOVOLOG) 100 UNIT/ML injection, Inject 15-40 Units into the skin 3 (three) times daily before meals. Sliding Scale, Disp: , Rfl: ;  insulin glargine (LANTUS) 100 UNIT/ML injection, Inject into the skin 2 (two) times daily. 50 units in the am and 60 units in the pm, Disp: , Rfl: ;  levothyroxine (SYNTHROID, LEVOTHROID) 100 MCG tablet, Take 100 mcg by mouth daily.  , Disp: ,   Rfl:  Liraglutide (VICTOZA [place]), Inject 1.8 Units into the skin as directed. , Disp: , Rfl: ;  multivitamin (THERAGRAN) per tablet, Take 1 tablet by mouth daily.  , Disp: , Rfl: ;  Omega-3 Fatty Acids (FISH OIL) 1000 MG CAPS, Take 1,000 mg by mouth daily., Disp: , Rfl: ;  potassium chloride SA (K-DUR,KLOR-CON) 20 MEQ tablet, Take 1 tablet (20 mEq total) by mouth 2 (two) times daily., Disp: 60 tablet, Rfl: 4;  SANTYL ointment, , Disp: , Rfl:  simvastatin (ZOCOR) 80 MG tablet, Take 1 tablet (80 mg total) by mouth at bedtime., Disp: 30 tablet, Rfl: 6;  vitamin E 600 UNIT capsule, Take 600 Units by mouth daily.  , Disp: , Rfl:   BP 154/55  Pulse 70  Ht 5\' 7"  (1.702 m)  Wt 252 lb (114.306 kg)  BMI 39.46 kg/m2  SpO2 98%  Body mass index is 39.46  kg/(m^2).           Review of Systems patient has morbid obesity, does not ambulate long distances, has chronic edema, and previous coronary artery disease and coronary artery bypass grafting in 2009. Denies chest pain, dyspnea on exertion, PND, orthopnea, hemoptysis, other systems negative and complete review of systems    Objective:   Physical Exam BP 154/55  Pulse 70  Ht 5\' 7"  (1.702 m)  Wt 252 lb (114.306 kg)  BMI 39.46 kg/m2  SpO2 98%  Gen.-alert and oriented x3 in no apparent distress-morbidly obese HEENT normal for age Lungs no rhonchi or wheezing Cardiovascular regular rhythm no murmurs carotid pulses 3+ palpable no bruits audible Abdomen soft nontender no palpable masses-morbidly obese-large panniculus Musculoskeletal free of  major deformities Skin clear -no rashes Neurologic normal Lower extremities 3+ femoral pulses bilaterally. Patient has large panniculus and inguinal region bilaterally. No popliteal or distal pulses palpable.  Left foot with ulceration over the metatarsal bone-fifth measuring 1 cm in diameter. Second toe amputation well healed all other toes intact.  Today I ordered lower extremity arterial Doppler exam which are viewed and interpret. Patient has an ABI of 0.58 on the right and 0.44 on the left which is very similar to that obtained by Dr. fields in 2010.        Assessment:     Nonhealing ulcer left foot in patient with diabetes mellitus and lower extremity occlusive disease with ABI of 0.44 Patient has morbid obesity Patient has an implantable pacemaker and is on Eliquis daily    Plan:     Plan aortobifemoral angiography with possible intervention or bypass by Dr. Darrick Pennafields on Friday, April 10-will hold Eliquis after April 4 dose Patient has likely had left saphenous vein removed for coronary artery bypass grafting

## 2013-08-04 NOTE — Op Note (Signed)
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 The placed in supine position on the Angio table. Both groins were prepped and draped in usual sterile fashion. Do to the patient's obesity fluoroscopy was used to identify the right femoral head. Local anesthesia was infiltrated over this location area. An introducer needle was then used to cannulate the right common femoral artery under fluoroscopy using the femoral head AND calcification of the vessel as anatomic landmarks.  An 035 versacore wire was threaded into the abdominal aorta under fluoroscopic guidance. A 5 French sheath was then placed over the guidewire the right common femoral artery. This was thoroughly flushed with heparinized saline.  Next the pigtail catheter was placed over the guidewire and the abdominal aorta. Abdominal aortogram was obtained in AP projection. There are single left and right renal arteries which are patent. The inferior mesenteric artery has a 70% origin stenosis. The infrarenal abdominal aorta is patent. The left common iliac artery has a shelflike stenosis of 70-80%.. The right common iliac artery is patent. The left internal iliac artery is occluded. The right internal iliac artery is patent.  The external iliac arteries are patent bilaterally. Next the pigtail catheter was pulled down to the aortic bifurcation and oblique views and pelvis were performed to confirm the above findings.   Next lower extremity runoff views were obtained through the pigtail catheter. In the right lower extremity, the right common femoral artery is patent. The right profunda femoris artery is patent. The right superficial femoral artery is occluded. The popliteal artery reconstitutes below the knee via profunda collaterals. There is two-vessel runoff via the anterior tibial and peroneal arteries. The popliteal artery is small.  In the left lower extremity, the left common femoral artery has a 50% stenosis. The profunda femoris is patent. The left superficial femoral artery is occluded at its origin. The below-knee popliteal artery constitutes via profunda collaterals. The anterior tibial artery is the dominant runoff vessel to the foot. The peroneal artery is patent but small. The peroneal artery does reconstitute the posterior tibial artery at the level of the ankle filling a complete plantar arch.  At this point it was decided to intervene on the left common iliac artery stenosis. The pigtail catheter was removed over the versacore wire. A 5 French sheath was exchanged for a 7 [place] sheath. The patient was given 7000 units of intravenous heparin. ACT was 194. 5 French crossover catheter was used to selectively catheterize the left common iliac artery. An 17035  angled Glidewire was then advanced across the stenosis down into the distal left external iliac artery. The crossover catheter was then advanced over the wire into the external iliac artery. The Glidewire was then replaced with an 035 Rosen wire. The crossover catheter was removed over the Rosen wire. The dilator for the sheath was replaced and the sheath and dilator were advanced up and over the aortic bifurcation. Contrast angiogram was performed to  determine the precise location of the stenosis. A 9 x 24 balloon expandable stent was selected. This was advanced up to the level of the stenosis. He was then deployed to 9 atmospheres for 30 seconds. At completion arteriogram was obtained which showed the stent was widely patent with no residual stenosis and no evidence of dissection. The dilator was placed back in the sheath and this was pulled back in the right pelvis over the guidewire.  The sheath was left in place to be pulled in the holding area.  The patient tolerated the procedure well and there were no complications. The patient was taken to the holding area in stable condition.  Management: The patient will be given a few more weeks to see if her left foot will heal with improved iliac flow. If her foot has not healed at that time consideration will be given for a left femoral to below-knee popliteal bypass. However, this would be a high-risk procedure due to the patient's obesity. Also the patient most likely lacks venous conduit. She will need vein mapping at her next office visit.  Fabienne Brunsharles Jadence Kinlaw, MD [facility] Office: [phone] Pager: [phone]

## 2013-08-04 NOTE — Discharge Instructions (Signed)
PLEASE TAKE ASPIRIN 81 MG ONCE DAILY FOR 6 WEEKS STARTING TOMORROW  DO NOT TAKE YOUR ELIQUIS UNTIL Sunday 4/26 THEN RESUME AT YOUR USUAL DOSE  Arteriogram Care After These instructions give you information on caring for yourself after your procedure. Your doctor may also give you more specific instructions. Call your doctor if you have any problems or questions after your procedure. HOME CARE  Stay in bed the rest of the day.   Keep your leg straight for at least 6 hours.   Do not lift anything heavier than 5 pounds (about a gallon of milk) for 2 days.   Do not walk a lot, run, or drive for 2 days.   Return to normal activities in 2 days or as told by your doctor.   Drink plenty of fluids today water is the best  Avoid caffeine beverages GET HELP RIGHT AWAY IF:   You have fever of 102 F (38.9 C) or higher.   You have more pain in your leg.   The leg that was cut is:   Bleeding.   Puffy (swollen) or red.   Cold.   Pale or changes color.   Weak.   Tingly or numb.  If you go to the Emergency Room, tell your nurse that you have had an arteriogram. Take this paper with you to show the nurse. MAKE SURE YOU:  Understand these instructions.   Will watch your condition.   Will get help right away if you are not doing well or get worse.

## 2013-08-10 DIAGNOSIS — L97509 Non-pressure chronic ulcer of other part of unspecified foot with unspecified severity: Secondary | ICD-10-CM | POA: Diagnosis not present

## 2013-08-10 DIAGNOSIS — E1169 Type 2 diabetes mellitus with other specified complication: Secondary | ICD-10-CM | POA: Diagnosis not present

## 2013-08-17 ENCOUNTER — Encounter (HOSPITAL_BASED_OUTPATIENT_CLINIC_OR_DEPARTMENT_OTHER): Payer: Medicare Other | Attending: Internal Medicine

## 2013-08-17 DIAGNOSIS — E1169 Type 2 diabetes mellitus with other specified complication: Secondary | ICD-10-CM | POA: Insufficient documentation

## 2013-08-17 DIAGNOSIS — L97509 Non-pressure chronic ulcer of other part of unspecified foot with unspecified severity: Secondary | ICD-10-CM | POA: Diagnosis not present

## 2013-08-24 ENCOUNTER — Other Ambulatory Visit: Payer: Self-pay | Admitting: Family Medicine

## 2013-08-24 ENCOUNTER — Other Ambulatory Visit: Payer: Self-pay | Admitting: Cardiology

## 2013-08-24 NOTE — Telephone Encounter (Signed)
Ok plus 5 monthly ref 

## 2013-08-24 NOTE — Telephone Encounter (Signed)
Seen 08/02/12

## 2013-08-31 DIAGNOSIS — E1169 Type 2 diabetes mellitus with other specified complication: Secondary | ICD-10-CM | POA: Diagnosis not present

## 2013-08-31 DIAGNOSIS — L97509 Non-pressure chronic ulcer of other part of unspecified foot with unspecified severity: Secondary | ICD-10-CM | POA: Diagnosis not present

## 2013-09-07 DIAGNOSIS — E1169 Type 2 diabetes mellitus with other specified complication: Secondary | ICD-10-CM | POA: Diagnosis not present

## 2013-09-07 DIAGNOSIS — L97509 Non-pressure chronic ulcer of other part of unspecified foot with unspecified severity: Secondary | ICD-10-CM | POA: Diagnosis not present

## 2013-09-11 ENCOUNTER — Encounter: Payer: Self-pay | Admitting: Internal Medicine

## 2013-09-11 ENCOUNTER — Encounter: Payer: Medicare Other | Admitting: Internal Medicine

## 2013-09-13 ENCOUNTER — Encounter: Payer: Self-pay | Admitting: Vascular Surgery

## 2013-09-14 ENCOUNTER — Encounter (HOSPITAL_BASED_OUTPATIENT_CLINIC_OR_DEPARTMENT_OTHER): Payer: Medicare Other | Attending: Internal Medicine

## 2013-09-14 ENCOUNTER — Ambulatory Visit (INDEPENDENT_AMBULATORY_CARE_PROVIDER_SITE_OTHER): Payer: Medicare Other | Admitting: Vascular Surgery

## 2013-09-14 ENCOUNTER — Encounter: Payer: Self-pay | Admitting: Vascular Surgery

## 2013-09-14 ENCOUNTER — Ambulatory Visit (HOSPITAL_COMMUNITY)
Admission: RE | Admit: 2013-09-14 | Discharge: 2013-09-14 | Disposition: A | Discharge status: Home / Self Care | Payer: Medicare Other | Source: Ambulatory Visit | Attending: Vascular Surgery | Admitting: Vascular Surgery

## 2013-09-14 ENCOUNTER — Ambulatory Visit (INDEPENDENT_AMBULATORY_CARE_PROVIDER_SITE_OTHER)
Admit: 2013-09-14 | Discharge: 2013-09-14 | Disposition: A | Discharge status: Home / Self Care | Payer: Medicare Other | Attending: Vascular Surgery | Admitting: Vascular Surgery

## 2013-09-14 VITALS — BP 143/55 | HR 69 | Ht 67.0 in | Wt 250.3 lb

## 2013-09-14 DIAGNOSIS — Z01810 Encounter for preprocedural cardiovascular examination: Secondary | ICD-10-CM

## 2013-09-14 DIAGNOSIS — I739 Peripheral vascular disease, unspecified: Secondary | ICD-10-CM | POA: Insufficient documentation

## 2013-09-14 DIAGNOSIS — E1169 Type 2 diabetes mellitus with other specified complication: Secondary | ICD-10-CM | POA: Insufficient documentation

## 2013-09-14 DIAGNOSIS — L97509 Non-pressure chronic ulcer of other part of unspecified foot with unspecified severity: Secondary | ICD-10-CM | POA: Diagnosis not present

## 2013-09-14 DIAGNOSIS — L98499 Non-pressure chronic ulcer of skin of other sites with unspecified severity: Principal | ICD-10-CM

## 2013-09-14 MED ORDER — LEVOFLOXACIN 500 MG PO TABS: 500.0000 mg | ORAL_TABLET | Freq: Every day | ORAL | Status: DC

## 2013-09-14 NOTE — Progress Notes (Signed)
HPI this 68 year old female who returns for followup after recent arteriogram with left iliac artery stenting. 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 The patient has an ulceration on the lateral aspect of her left foot. She is currently being followed by Dr. Leanord Hawking at the wound center. She states that he thought the wound had improved since stenting. This causes difficulty in her ambulation. She denies any ulcerations in the contralateral right leg.    Past Medical History   Diagnosis  Date   .  Chronic systolic heart failure     .  Diabetes mellitus, type 2     .  Hyperlipidemia     .  Essential hypertension, benign     .  Hypothyroidism     .  PVD (peripheral vascular disease)     .  Coronary atherosclerosis of native coronary artery         Multivessel, patent grafts 2007   .  Ischemic cardiomyopathy         Ejection Fraction of 25% improved to 45-50% following biventicular pacing, Status post biventricular dual-chamber ICD - [employer]   .  Hypothyroidism     .  Cardiac defibrillator in place     .  Colon polyps         20 yrs ago   .  Ulcer     .  CHF (congestive heart failure)     .  CAD (coronary artery disease)     .  Myocardial infarction         History   Substance Use Topics   .  Smoking status:  Former Smoker       Quit date:  09/16/1991   .  Smokeless tobacco:  Never Used   .  Alcohol Use:  No       Family History   Problem  Relation  Age of Onset   .  Diabetes  Mother     .  Ulcers  Mother     .  Heart disease  Mother         before age 77   .  Hyperlipidemia  Mother     .  Hypertension  Mother     .  Diabetes  Sister     .  Heart disease  Sister         before age 35   .  Heart attack  Sister     .  Diabetes  Father     .  Heart attack  Father     .  Hyperlipidemia  Father     .  Hypertension  Father     .  Heart disease  Father      before age 66   .  Coronary artery disease  Sister  79       Allergies   Allergen  Reactions   .  Celebrex [Celecoxib]       Current outpatient prescriptions:ALPRAZolam (XANAX) 1 MG tablet, TAKE 1 TABLET UPTO 2 TIMES A DAY, Disp: 60 tablet, Rfl: 5;  apixaban (ELIQUIS) 5 MG TABS tablet, Take 1 tablet (5 mg total) by mouth 2 (two) times daily., Disp: 60 tablet, Rfl: 6;  carvedilol (COREG) 25 MG tablet, Take 1 tablet (25 mg total) by mouth 2 (two) times daily with a meal., Disp: 60 tablet, Rfl: 4 enalapril (VASOTEC) 10 MG tablet, Take 1 tablet (10  mg total) by mouth 2 (two) times daily., Disp: 60 tablet, Rfl: 4;  fenofibrate 160 MG tablet, TAKE ONE (1) TABLET AT BEDTIME, Disp: 30 tablet, Rfl: 2;  furosemide (LASIX) 40 MG tablet, Take 1 tablet (40 mg total) by mouth 2 (two) times daily., Disp: 60 tablet, Rfl: 6 insulin aspart (NOVOLOG) 100 UNIT/ML injection, Inject 15-40 Units into the skin 3 (three) times daily before meals. Sliding Scale, Disp: , Rfl: ;  insulin glargine (LANTUS) 100 UNIT/ML injection, Inject into the skin 2 (two) times daily. 50 units in the am and 60 units in the pm, Disp: , Rfl: ;  levothyroxine (SYNTHROID, LEVOTHROID) 100 MCG tablet, Take 100 mcg by mouth daily.  , Disp: , Rfl:   Liraglutide (VICTOZA [place]), Inject 1.8 Units into the skin as directed. , Disp: , Rfl: ;  multivitamin (THERAGRAN) per tablet, Take 1 tablet by mouth daily.  , Disp: , Rfl: ;  Omega-3 Fatty Acids (FISH OIL) 1000 MG CAPS, Take 1,000 mg by mouth daily., Disp: , Rfl: ;  potassium chloride SA (K-DUR,KLOR-CON) 20 MEQ tablet, Take 1 tablet (20 mEq total) by mouth 2 (two) times daily., Disp: 60 tablet, Rfl: 4;  SANTYL ointment, , Disp: , Rfl:   simvastatin (ZOCOR) 80 MG tablet, Take 1 tablet (80 mg total) by mouth at bedtime., Disp: 30 tablet, Rfl: 6;  vitamin E 600 UNIT capsule, Take 600 Units by mouth daily.  , Disp: , Rfl:    Review of Systems patient has morbid obesity, does not ambulate long distances, has  chronic edema, and previous coronary artery disease and coronary artery bypass grafting in 2009. Denies chest pain, dyspnea on exertion, PND, orthopnea, hemoptysis, other systems negative and complete review of systems     Objective:    Physical Exam   Filed Vitals:   09/14/13 1438  BP: 143/55  Pulse: 69  Height: 5\' 7"  (1.702 m)  Weight: 250 lb 4.8 oz (113.535 kg)  SpO2: 100%   Gen.-alert and oriented x3 in no apparent distress-morbidly obese HEENT normal for age Lungs no rhonchi or wheezing Cardiovascular regular rhythm no murmurs carotid pulses 3+ palpable no bruits audible Abdomen soft nontender no palpable masses-morbidly obese-large panniculus Musculoskeletal free of  major deformities Skin clear -no rashes Neurologic normal Lower extremities 3+ femoral pulses bilaterally. Patient has large panniculus and inguinal region bilaterally. No popliteal or distal pulses palpable. Left foot with ulceration over the metatarsal bone-fifth measuring 1 mm in diameter. There is some surrounding erythema and a small amount of drainage is expressible Second toe amputation well healed all other toes intact.  Data: ABI today on the left side was 0.45 with monophasic flow. Vein mapping ultrasound shows the left greater saphenous vein has been completely harvested. The right greater saphenous vein is of reasonable quality 3-4 mm in diameter.  Assessment:      Nonhealing ulcer left foot in patient with diabetes mellitus possible osteomyelitis left fifth metatarsal head. Patient has morbid obesity which will make a bypass operation more difficult due to significant risk of wound complications. She would also need vein harvested from the contralateral leg. Patient has an implantable pacemaker and is on Eliquis daily     Plan:      I placed the patient on Levaquin today for a 6 week total course. If the wound deteriorates or she has not completely resolved the drainage in her left foot in 6 weeks we  will need to consider left femoropopliteal bypass with contralateral vein as  well as most likely removal of the left fifth metatarsal head. Hopefully this will heal with conservative measures.  Fabienne Brunsharles Anuel Sitter, MD [facility] Office: [phone] Pager: [phone]

## 2013-09-15 ENCOUNTER — Encounter: Payer: Self-pay | Admitting: *Deleted

## 2013-09-21 ENCOUNTER — Ambulatory Visit (HOSPITAL_COMMUNITY)
Admission: RE | Admit: 2013-09-21 | Discharge: 2013-09-21 | Disposition: A | Discharge status: Home / Self Care | Payer: Medicare Other | Source: Ambulatory Visit | Attending: Internal Medicine | Admitting: Internal Medicine

## 2013-09-21 ENCOUNTER — Other Ambulatory Visit (HOSPITAL_BASED_OUTPATIENT_CLINIC_OR_DEPARTMENT_OTHER): Payer: Self-pay | Admitting: Internal Medicine

## 2013-09-21 DIAGNOSIS — L97509 Non-pressure chronic ulcer of other part of unspecified foot with unspecified severity: Secondary | ICD-10-CM | POA: Diagnosis not present

## 2013-09-21 DIAGNOSIS — S99929A Unspecified injury of unspecified foot, initial encounter: Principal | ICD-10-CM

## 2013-09-21 DIAGNOSIS — E1169 Type 2 diabetes mellitus with other specified complication: Secondary | ICD-10-CM | POA: Diagnosis not present

## 2013-09-21 DIAGNOSIS — M795 Residual foreign body in soft tissue: Secondary | ICD-10-CM | POA: Diagnosis not present

## 2013-09-21 DIAGNOSIS — M869 Osteomyelitis, unspecified: Secondary | ICD-10-CM

## 2013-09-21 DIAGNOSIS — X58XXXA Exposure to other specified factors, initial encounter: Secondary | ICD-10-CM | POA: Insufficient documentation

## 2013-09-21 DIAGNOSIS — S8990XA Unspecified injury of unspecified lower leg, initial encounter: Secondary | ICD-10-CM | POA: Insufficient documentation

## 2013-09-21 DIAGNOSIS — M79609 Pain in unspecified limb: Secondary | ICD-10-CM | POA: Diagnosis not present

## 2013-09-21 DIAGNOSIS — S99919A Unspecified injury of unspecified ankle, initial encounter: Principal | ICD-10-CM

## 2013-09-21 DIAGNOSIS — S91309A Unspecified open wound, unspecified foot, initial encounter: Secondary | ICD-10-CM | POA: Diagnosis not present

## 2013-09-28 DIAGNOSIS — E1169 Type 2 diabetes mellitus with other specified complication: Secondary | ICD-10-CM | POA: Diagnosis not present

## 2013-09-28 DIAGNOSIS — L97509 Non-pressure chronic ulcer of other part of unspecified foot with unspecified severity: Secondary | ICD-10-CM | POA: Diagnosis not present

## 2013-10-05 DIAGNOSIS — L97509 Non-pressure chronic ulcer of other part of unspecified foot with unspecified severity: Secondary | ICD-10-CM | POA: Diagnosis not present

## 2013-10-05 DIAGNOSIS — E1169 Type 2 diabetes mellitus with other specified complication: Secondary | ICD-10-CM | POA: Diagnosis not present

## 2013-10-05 DIAGNOSIS — E785 Hyperlipidemia, unspecified: Secondary | ICD-10-CM | POA: Diagnosis not present

## 2013-10-05 DIAGNOSIS — IMO0001 Reserved for inherently not codable concepts without codable children: Secondary | ICD-10-CM | POA: Diagnosis not present

## 2013-10-05 DIAGNOSIS — I10 Essential (primary) hypertension: Secondary | ICD-10-CM | POA: Diagnosis not present

## 2013-10-06 ENCOUNTER — Other Ambulatory Visit: Payer: Self-pay | Admitting: Family Medicine

## 2013-10-12 ENCOUNTER — Encounter (HOSPITAL_BASED_OUTPATIENT_CLINIC_OR_DEPARTMENT_OTHER): Payer: Medicare Other | Attending: Internal Medicine

## 2013-10-12 DIAGNOSIS — E1169 Type 2 diabetes mellitus with other specified complication: Secondary | ICD-10-CM | POA: Diagnosis not present

## 2013-10-12 DIAGNOSIS — L97509 Non-pressure chronic ulcer of other part of unspecified foot with unspecified severity: Secondary | ICD-10-CM | POA: Diagnosis not present

## 2013-10-16 ENCOUNTER — Encounter: Payer: Medicare Other | Admitting: Internal Medicine

## 2013-10-19 DIAGNOSIS — L97509 Non-pressure chronic ulcer of other part of unspecified foot with unspecified severity: Secondary | ICD-10-CM | POA: Diagnosis not present

## 2013-10-19 DIAGNOSIS — E1169 Type 2 diabetes mellitus with other specified complication: Secondary | ICD-10-CM | POA: Diagnosis not present

## 2013-10-30 ENCOUNTER — Encounter: Payer: Self-pay | Admitting: Internal Medicine

## 2013-10-30 ENCOUNTER — Encounter: Payer: Self-pay | Admitting: Vascular Surgery

## 2013-10-30 ENCOUNTER — Ambulatory Visit (INDEPENDENT_AMBULATORY_CARE_PROVIDER_SITE_OTHER): Payer: Medicare Other | Admitting: Internal Medicine

## 2013-10-30 ENCOUNTER — Encounter: Payer: Self-pay | Admitting: *Deleted

## 2013-10-30 VITALS — BP 101/67 | HR 71 | Ht 67.0 in | Wt 247.8 lb

## 2013-10-30 DIAGNOSIS — I429 Cardiomyopathy, unspecified: Secondary | ICD-10-CM

## 2013-10-30 DIAGNOSIS — I2510 Atherosclerotic heart disease of native coronary artery without angina pectoris: Secondary | ICD-10-CM | POA: Diagnosis not present

## 2013-10-30 DIAGNOSIS — Z95810 Presence of automatic (implantable) cardiac defibrillator: Secondary | ICD-10-CM

## 2013-10-30 DIAGNOSIS — I4891 Unspecified atrial fibrillation: Secondary | ICD-10-CM | POA: Diagnosis not present

## 2013-10-30 DIAGNOSIS — I10 Essential (primary) hypertension: Secondary | ICD-10-CM

## 2013-10-30 LAB — MDC_IDC_ENUM_SESS_TYPE_INCLINIC
HighPow Impedance: 44 Ohm
Implantable Pulse Generator Serial Number: 512614
Lead Channel Impedance Value: 767 Ohm
Lead Channel Pacing Threshold Amplitude: 1 V
Lead Channel Pacing Threshold Pulse Width: 0.4 ms
Lead Channel Pacing Threshold Pulse Width: 0.5 ms
MDC IDC MSMT LEADCHNL RA IMPEDANCE VALUE: 517 Ohm
MDC IDC MSMT LEADCHNL RA SENSING INTR AMPL: 0.4 mV
MDC IDC MSMT LEADCHNL RV IMPEDANCE VALUE: 709 Ohm
MDC IDC MSMT LEADCHNL RV PACING THRESHOLD AMPLITUDE: 1 V
MDC IDC SET ZONE DETECTION INTERVAL: 342.8 ms
MDC IDC STAT BRADY RA PERCENT PACED: 2 %
MDC IDC STAT BRADY RV PERCENT PACED: 100 %
Zone Setting Detection Interval: 250 ms
Zone Setting Detection Interval: 300 ms

## 2013-10-30 NOTE — Progress Notes (Signed)
HPI Mrs. Davidson returns today for followup. She is a pleasant 68 yo woman with a h/o ICM, chronic systolic CHF, LBBB, and now high grade heart block,  s/p ICD implant. In the interim, She feels well. She denies chest pain, sob, or syncope. No ICD shocks. No palpitations. She has had a nice improvement in her LV function to 45%. She has never had an ICD shock and [facility]. Allergies  Allergen Reactions  . Avandia [Rosiglitazone] Other (See Comments)    CHF  . Celebrex [Celecoxib] Other (See Comments)    Congestive heart failure     Current Outpatient Prescriptions  Medication Sig Dispense Refill  . ALPRAZolam (XANAX) 1 MG tablet TAKE 1 TABLET UPTO 2 TIMES A DAY as needed      . carvedilol (COREG) 25 MG tablet Take 1 tablet (25 mg total) by mouth 2 (two) times daily with a meal.  60 tablet  4  . ELIQUIS 5 MG TABS tablet TAKE ONE TABLET BY MOUTH TWICE A DAY  60 tablet  3  . enalapril (VASOTEC) 10 MG tablet TAKE ONE TABLET BY MOUTH TWO TIMES DAILY  60 tablet  6  . fenofibrate 160 MG tablet Take 160 mg by mouth at bedtime.      . furosemide (LASIX) 40 MG tablet Take 1 tablet (40 mg total) by mouth 2 (two) times daily.  60 tablet  6  . insulin aspart (NOVOLOG) 100 UNIT/ML injection Inject 15-40 Units into the skin 3 (three) times daily before meals. Sliding Scale      . insulin glargine (LANTUS) 100 UNIT/ML injection Inject 80 Units into the skin daily.       Marland Kitchen. levothyroxine (SYNTHROID, LEVOTHROID) 100 MCG tablet Take 100 mcg by mouth daily.        . Liraglutide (VICTOZA [place]) Inject 1.8 Units into the skin as directed.       . multivitamin (THERAGRAN) per tablet Take 1 tablet by mouth daily.        . Omega-3 Fatty Acids (FISH OIL) 1000 MG CAPS Take 1,000 mg by mouth daily.      . potassium chloride SA (K-DUR,KLOR-CON) 20 MEQ tablet TAKE ONE TABLET BY MOUTH TWO TIMES DAILY  60 tablet  3  . simvastatin (ZOCOR) 80 MG tablet Take 1 tablet (80 mg total) by mouth at bedtime.  30 tablet  6  .  vitamin E 600 UNIT capsule Take 600 Units by mouth daily.         No current facility-administered medications for this visit.     Past Medical History  Diagnosis Date  . Chronic systolic heart failure   . Diabetes mellitus, type 2   . Hyperlipidemia   . Essential hypertension, benign   . Hypothyroidism   . PVD (peripheral vascular disease)   . Coronary atherosclerosis of native coronary artery     Multivessel, patent grafts 2007  . Ischemic cardiomyopathy     Ejection Fraction of 25% improved to 45-50% following biventicular pacing, Status post biventricular dual-chamber ICD - [employer] Livian H22  . Hypothyroidism   . Cardiac defibrillator in place   . Colon polyps     20 yrs ago  . Ulcer   . CHF (congestive heart failure)   . CAD (coronary artery disease)   . Myocardial infarction     ROS:   All systems reviewed and negative except as noted in the HPI.   Past Surgical History  Procedure Laterality Date  .  Coronary artery bypass graft  2001    [facility] - LIMA to LAD, SVG to ramus, SVG to OM/PLB, SVG to RCA   . Appendectomy    . Bilateral tubal ligation    . Cardiac defibrillator placement      [employer] Livian  . Flexible sigmoidoscopy  07/19/1998    Dr. Karilyn Cotaehman  . Tonsillectomy    . Cesarean section       Family History  Problem Relation Age of Onset  . Diabetes Mother   . Ulcers Mother   . Heart disease Mother     before age 66  . Hyperlipidemia Mother   . Hypertension Mother   . Diabetes Sister   . Heart disease Sister     before age 66  . Heart attack Sister   . Diabetes Father   . Heart attack Father   . Hyperlipidemia Father   . Hypertension Father   . Heart disease Father     before age 66  . Coronary artery disease Sister [id number]     History   Social History  . Marital Status: Divorced    Spouse Name: N/A    Number of Children: 1  . Years of Education: N/A   Occupational History  . Full Time: [employer]   .  Disabled   .     Social History Main Topics  . Smoking status: Former Smoker    Quit date: 09/16/1991  . Smokeless tobacco: Never Used  . Alcohol Use: No  . Drug Use: No  . Sexual Activity: No   Other Topics Concern  . Not on file   Social History Narrative  . No narrative on file     BP - 132/76, P - 69, weight 255 lbs  Physical Exam:  Obese appearing 68 yo woman NAD HEENT: Unremarkable Neck:  No JVD, no thyromegally, I do not appreciate a gouter Back:  No CVA tenderness Lungs:  Clear with no wheezes. HEART:  Regular rate rhythm, no murmurs, no rubs, no clicks Abd:  Soft,obese,  positive bowel sounds, no organomegally, no rebound, no guarding Ext:  2 plus pulses, no edema, no cyanosis, no clubbing Skin:  No rashes no nodules Neuro:  CN II through XII intact, motor grossly intact   DEVICE  Normal device function.  See PaceArt for details. Patient has reverted to atrial fibrillation.[facility]  Assess/Plan:

## 2013-10-30 NOTE — Assessment & Plan Note (Signed)
Her blood pressure is well controlled. Will follow. She is encouraged to maintain a low sodium diet. 

## 2013-10-30 NOTE — Patient Instructions (Signed)
Your physician recommends that you schedule a follow-up appointment in: Post procedure  Your physician has recommended that you have a pacemaker inserted. A pacemaker is a small device that is placed under the skin of your chest or abdomen to help control abnormal heart rhythms. This device uses electrical pulses to prompt the heart to beat at a normal rate. Pacemakers are used to treat heart rhythms that are too slow. Wire (leads) are attached to the pacemaker that goes into the chambers of you heart. This is done in the hospital and usually requires and overnight stay. Please see the instruction sheet given to you today for more information.

## 2013-10-30 NOTE — Assessment & Plan Note (Signed)
She is encouraged to lose weight.  

## 2013-10-30 NOTE — Assessment & Plan Note (Signed)
[facility] She will be scheduled to downgrade her ICD to a BiV PPM.

## 2013-10-31 ENCOUNTER — Ambulatory Visit: Payer: Medicare Other | Admitting: Vascular Surgery

## 2013-11-02 ENCOUNTER — Ambulatory Visit: Payer: Medicare Other | Admitting: Vascular Surgery

## 2013-11-02 DIAGNOSIS — L97509 Non-pressure chronic ulcer of other part of unspecified foot with unspecified severity: Secondary | ICD-10-CM | POA: Diagnosis not present

## 2013-11-02 DIAGNOSIS — E1169 Type 2 diabetes mellitus with other specified complication: Secondary | ICD-10-CM | POA: Diagnosis not present

## 2013-11-06 DIAGNOSIS — H26499 Other secondary cataract, unspecified eye: Secondary | ICD-10-CM | POA: Diagnosis not present

## 2013-11-06 DIAGNOSIS — H43399 Other vitreous opacities, unspecified eye: Secondary | ICD-10-CM | POA: Diagnosis not present

## 2013-11-08 ENCOUNTER — Encounter: Payer: Self-pay | Admitting: Vascular Surgery

## 2013-11-09 ENCOUNTER — Ambulatory Visit: Payer: Medicare Other | Admitting: Vascular Surgery

## 2013-11-09 DIAGNOSIS — L97509 Non-pressure chronic ulcer of other part of unspecified foot with unspecified severity: Secondary | ICD-10-CM | POA: Diagnosis not present

## 2013-11-09 DIAGNOSIS — E1169 Type 2 diabetes mellitus with other specified complication: Secondary | ICD-10-CM | POA: Diagnosis not present

## 2013-11-13 ENCOUNTER — Ambulatory Visit (INDEPENDENT_AMBULATORY_CARE_PROVIDER_SITE_OTHER): Payer: Medicare Other | Admitting: Family Medicine

## 2013-11-13 ENCOUNTER — Encounter: Payer: Self-pay | Admitting: Family Medicine

## 2013-11-13 VITALS — BP 124/72 | Ht 67.0 in | Wt 247.0 lb

## 2013-11-13 DIAGNOSIS — I4891 Unspecified atrial fibrillation: Secondary | ICD-10-CM | POA: Diagnosis not present

## 2013-11-13 DIAGNOSIS — I2510 Atherosclerotic heart disease of native coronary artery without angina pectoris: Secondary | ICD-10-CM | POA: Diagnosis not present

## 2013-11-13 DIAGNOSIS — Z23 Encounter for immunization: Secondary | ICD-10-CM

## 2013-11-13 DIAGNOSIS — L98499 Non-pressure chronic ulcer of skin of other sites with unspecified severity: Secondary | ICD-10-CM | POA: Diagnosis not present

## 2013-11-13 DIAGNOSIS — E785 Hyperlipidemia, unspecified: Secondary | ICD-10-CM

## 2013-11-13 DIAGNOSIS — E039 Hypothyroidism, unspecified: Secondary | ICD-10-CM | POA: Diagnosis not present

## 2013-11-13 DIAGNOSIS — I739 Peripheral vascular disease, unspecified: Secondary | ICD-10-CM | POA: Diagnosis not present

## 2013-11-13 DIAGNOSIS — Z95810 Presence of automatic (implantable) cardiac defibrillator: Secondary | ICD-10-CM

## 2013-11-13 DIAGNOSIS — I482 Chronic atrial fibrillation, unspecified: Secondary | ICD-10-CM

## 2013-11-13 MED ORDER — ALPRAZOLAM 1 MG PO TABS: ORAL_TABLET | ORAL | Status: DC

## 2013-11-13 NOTE — Progress Notes (Signed)
   Subjective:    Patient ID: [name] [name], female    DOB: April 20, 1945, 68 y.o.   MRN: [id number]  Hyperlipidemia This is a chronic problem. The current episode started more than 1 year ago.  Takes meds evey day. No side effects. Pt states she does house work and works in the yard for exercise. Follow a healthy diet most of the time. Eats a lot of vegetables. Doesn't eat fried foods.   Sees Dr. Fransico HimNida for diabetes. States generally his sugars in good control. Trying to watch her diet.  Compliant with her lipid medication. Last lipid panel was good per patient.  Complaint blood pressure medicine. No obvious side effects. Trying to watch salt in diet.  Ongoing difficulty sleeping at times. Accompanied by anxiety at times. Would like him more Xanax for this.  History of known coronary artery disease. Of interest most recent echo shows improved ejection fraction. In fact they're planning to remove her automatic defibrillator because of this   Continues to see the wound care center with her history of foot infections.  No concerns today    Review of Systems No headache no chest pain no abdominal pain no shortness of breath no change in bowel habits no blood in stool ROS otherwise negative    Objective:   Physical Exam  Alert no apparent distress. Obesity present. Vital stable. H&T normal. Lungs clear. Heart regular in rhythm. Ankles without edema.      Assessment & Plan:  Pressure 1 hypertension good control. #2 hyperlipidemia good control. #3 coronary artery disease clinically silent #4 insomnia ongoing. #5 type 2 diabetes followed by specialist plan 25 minutes spent most in discussion. Xanax refilled. Diet exercise discussed. Recheck every 6 months. WSL

## 2013-11-16 ENCOUNTER — Encounter (HOSPITAL_BASED_OUTPATIENT_CLINIC_OR_DEPARTMENT_OTHER): Payer: Medicare Other | Attending: Internal Medicine

## 2013-11-16 DIAGNOSIS — E1169 Type 2 diabetes mellitus with other specified complication: Secondary | ICD-10-CM | POA: Insufficient documentation

## 2013-11-16 DIAGNOSIS — L97509 Non-pressure chronic ulcer of other part of unspecified foot with unspecified severity: Secondary | ICD-10-CM | POA: Diagnosis not present

## 2013-11-19 DIAGNOSIS — E039 Hypothyroidism, unspecified: Secondary | ICD-10-CM | POA: Diagnosis not present

## 2013-11-19 DIAGNOSIS — Z7901 Long term (current) use of anticoagulants: Secondary | ICD-10-CM | POA: Diagnosis not present

## 2013-11-19 DIAGNOSIS — I4891 Unspecified atrial fibrillation: Secondary | ICD-10-CM | POA: Diagnosis not present

## 2013-11-19 DIAGNOSIS — I2510 Atherosclerotic heart disease of native coronary artery without angina pectoris: Secondary | ICD-10-CM | POA: Diagnosis not present

## 2013-11-19 DIAGNOSIS — I447 Left bundle-branch block, unspecified: Secondary | ICD-10-CM | POA: Diagnosis not present

## 2013-11-19 DIAGNOSIS — I252 Old myocardial infarction: Secondary | ICD-10-CM | POA: Diagnosis not present

## 2013-11-19 DIAGNOSIS — Z79899 Other long term (current) drug therapy: Secondary | ICD-10-CM | POA: Diagnosis not present

## 2013-11-19 DIAGNOSIS — I442 Atrioventricular block, complete: Secondary | ICD-10-CM | POA: Diagnosis not present

## 2013-11-19 DIAGNOSIS — E785 Hyperlipidemia, unspecified: Secondary | ICD-10-CM | POA: Diagnosis not present

## 2013-11-19 DIAGNOSIS — Z87891 Personal history of nicotine dependence: Secondary | ICD-10-CM | POA: Diagnosis not present

## 2013-11-19 DIAGNOSIS — E119 Type 2 diabetes mellitus without complications: Secondary | ICD-10-CM | POA: Diagnosis not present

## 2013-11-19 DIAGNOSIS — Z4502 Encounter for adjustment and management of automatic implantable cardiac defibrillator: Secondary | ICD-10-CM | POA: Diagnosis not present

## 2013-11-19 DIAGNOSIS — I5022 Chronic systolic (congestive) heart failure: Secondary | ICD-10-CM | POA: Diagnosis not present

## 2013-11-19 DIAGNOSIS — I10 Essential (primary) hypertension: Secondary | ICD-10-CM | POA: Diagnosis not present

## 2013-11-19 DIAGNOSIS — I739 Peripheral vascular disease, unspecified: Secondary | ICD-10-CM | POA: Diagnosis not present

## 2013-11-19 DIAGNOSIS — Z794 Long term (current) use of insulin: Secondary | ICD-10-CM | POA: Diagnosis not present

## 2013-11-19 DIAGNOSIS — I2589 Other forms of chronic ischemic heart disease: Secondary | ICD-10-CM | POA: Diagnosis not present

## 2013-11-19 DIAGNOSIS — Z951 Presence of aortocoronary bypass graft: Secondary | ICD-10-CM | POA: Diagnosis not present

## 2013-11-19 MED ORDER — SODIUM CHLORIDE 0.9 % IV SOLN
INTRAVENOUS | Status: DC
  Administered 2013-11-20: 09:00:00 via INTRAVENOUS

## 2013-11-19 MED ORDER — MUPIROCIN 2 % EX OINT
TOPICAL_OINTMENT | Freq: Two times a day (BID) | CUTANEOUS | Status: DC
  Filled 2013-11-19: qty 22

## 2013-11-19 MED ORDER — CEFAZOLIN SODIUM-DEXTROSE 2-3 GM-% IV SOLR: 2.0000 g | INTRAVENOUS | Status: DC

## 2013-11-19 MED ORDER — SODIUM CHLORIDE 0.9 % IR SOLN
80.0000 mg | Status: DC
  Filled 2013-11-19: qty 2

## 2013-11-20 ENCOUNTER — Ambulatory Visit (HOSPITAL_COMMUNITY)
Admission: RE | Admit: 2013-11-20 | Discharge: 2013-11-20 | Disposition: A | Discharge status: Home / Self Care | Payer: Medicare Other | Source: Ambulatory Visit | Attending: Internal Medicine | Admitting: Internal Medicine

## 2013-11-20 ENCOUNTER — Encounter (HOSPITAL_COMMUNITY)
Admission: RE | Disposition: A | Discharge status: Home / Self Care | Payer: Self-pay | Source: Ambulatory Visit | Attending: Internal Medicine

## 2013-11-20 DIAGNOSIS — I2589 Other forms of chronic ischemic heart disease: Secondary | ICD-10-CM | POA: Insufficient documentation

## 2013-11-20 DIAGNOSIS — I447 Left bundle-branch block, unspecified: Secondary | ICD-10-CM | POA: Insufficient documentation

## 2013-11-20 DIAGNOSIS — I2510 Atherosclerotic heart disease of native coronary artery without angina pectoris: Secondary | ICD-10-CM | POA: Insufficient documentation

## 2013-11-20 DIAGNOSIS — I442 Atrioventricular block, complete: Secondary | ICD-10-CM

## 2013-11-20 DIAGNOSIS — E039 Hypothyroidism, unspecified: Secondary | ICD-10-CM | POA: Insufficient documentation

## 2013-11-20 DIAGNOSIS — I4891 Unspecified atrial fibrillation: Secondary | ICD-10-CM | POA: Diagnosis not present

## 2013-11-20 DIAGNOSIS — Z87891 Personal history of nicotine dependence: Secondary | ICD-10-CM | POA: Insufficient documentation

## 2013-11-20 DIAGNOSIS — I739 Peripheral vascular disease, unspecified: Secondary | ICD-10-CM | POA: Insufficient documentation

## 2013-11-20 DIAGNOSIS — I10 Essential (primary) hypertension: Secondary | ICD-10-CM | POA: Insufficient documentation

## 2013-11-20 DIAGNOSIS — E119 Type 2 diabetes mellitus without complications: Secondary | ICD-10-CM | POA: Insufficient documentation

## 2013-11-20 DIAGNOSIS — Z4502 Encounter for adjustment and management of automatic implantable cardiac defibrillator: Secondary | ICD-10-CM | POA: Diagnosis not present

## 2013-11-20 DIAGNOSIS — Z794 Long term (current) use of insulin: Secondary | ICD-10-CM | POA: Insufficient documentation

## 2013-11-20 DIAGNOSIS — I5022 Chronic systolic (congestive) heart failure: Secondary | ICD-10-CM | POA: Diagnosis not present

## 2013-11-20 DIAGNOSIS — I252 Old myocardial infarction: Secondary | ICD-10-CM | POA: Insufficient documentation

## 2013-11-20 DIAGNOSIS — Z951 Presence of aortocoronary bypass graft: Secondary | ICD-10-CM | POA: Insufficient documentation

## 2013-11-20 DIAGNOSIS — E785 Hyperlipidemia, unspecified: Secondary | ICD-10-CM | POA: Insufficient documentation

## 2013-11-20 DIAGNOSIS — Z7901 Long term (current) use of anticoagulants: Secondary | ICD-10-CM | POA: Insufficient documentation

## 2013-11-20 DIAGNOSIS — Z79899 Other long term (current) drug therapy: Secondary | ICD-10-CM | POA: Insufficient documentation

## 2013-11-20 HISTORY — PX: BI-VENTRICULAR PACEMAKER INSERTION: SHX5462

## 2013-11-20 LAB — SURGICAL PCR SCREEN
MRSA, PCR: NEGATIVE
Staphylococcus aureus: NEGATIVE

## 2013-11-20 LAB — GLUCOSE, CAPILLARY
Glucose-Capillary: 255 mg/dL — ABNORMAL HIGH (ref 70–99)
Glucose-Capillary: 246 mg/dL — ABNORMAL HIGH (ref 70–99)

## 2013-11-20 LAB — BASIC METABOLIC PANEL
ANION GAP: 13 (ref 5–15)
BUN: 19 mg/dL (ref 6–23)
CALCIUM: 9.3 mg/dL (ref 8.4–10.5)
CHLORIDE: 101 meq/L (ref 96–112)
CO2: 26 mEq/L (ref 19–32)
CREATININE: 0.73 mg/dL (ref 0.50–1.10)
GFR calc Af Amer: >90 mL/min (ref >90)
GFR calc non Af Amer: 87 mL/min — ABNORMAL LOW (ref >90)
Glucose, Bld: 247 mg/dL — ABNORMAL HIGH (ref 70–99)
Potassium: 4.7 mEq/L (ref 3.7–5.3)
Sodium: 140 mEq/L (ref 137–147)

## 2013-11-20 LAB — CBC
HCT: 39 % (ref 36.0–46.0)
Hemoglobin: 12.5 g/dL (ref 12.0–15.0)
MCH: 29.6 pg (ref 26.0–34.0)
MCHC: 32.1 g/dL (ref 30.0–36.0)
MCV: 92.2 fL (ref 78.0–100.0)
PLATELETS: 255 10*3/uL (ref 150–400)
RBC: 4.23 MIL/uL (ref 3.87–5.11)
RDW: 14.5 % (ref 11.5–15.5)
WBC: 6.3 10*3/uL (ref 4.0–10.5)

## 2013-11-20 SURGERY — BI-VENTRICULAR PACEMAKER INSERTION (CRT-P)
Anesthesia: LOCAL

## 2013-11-20 MED ORDER — MIDAZOLAM HCL 5 MG/5ML IJ SOLN
INTRAMUSCULAR | Status: AC
  Filled 2013-11-20: qty 5

## 2013-11-20 MED ORDER — CEFAZOLIN SODIUM-DEXTROSE 2-3 GM-% IV SOLR
INTRAVENOUS | Status: AC
  Filled 2013-11-20: qty 50

## 2013-11-20 MED ORDER — FENTANYL CITRATE 0.05 MG/ML IJ SOLN
INTRAMUSCULAR | Status: AC
  Filled 2013-11-20: qty 2

## 2013-11-20 MED ORDER — LIDOCAINE HCL (PF) 1 % IJ SOLN
INTRAMUSCULAR | Status: AC
  Filled 2013-11-20: qty 60

## 2013-11-20 MED ORDER — HEPARIN (PORCINE) IN NACL 2-0.9 UNIT/ML-% IJ SOLN
INTRAMUSCULAR | Status: AC
  Filled 2013-11-20: qty 500

## 2013-11-20 MED ORDER — ONDANSETRON HCL 4 MG/2ML IJ SOLN: 4.0000 mg | Freq: Four times a day (QID) | INTRAMUSCULAR | Status: DC | PRN

## 2013-11-20 MED ORDER — ACETAMINOPHEN 325 MG PO TABS
325.0000 mg | ORAL_TABLET | ORAL | Status: DC | PRN
  Filled 2013-11-20: qty 2

## 2013-11-20 MED ORDER — MUPIROCIN 2 % EX OINT
TOPICAL_OINTMENT | CUTANEOUS | Status: AC
  Filled 2013-11-20: qty 22

## 2013-11-20 MED ADMIN — Mupirocin Oint 2%: NASAL | @ 09:00:00

## 2013-11-20 NOTE — H&P (View-Only) (Signed)
HPI [name] [name] returns today for followup. She is a pleasant 68 yo woman with a h/o ICM, chronic systolic CHF, LBBB, and now high grade heart block,  s/p ICD implant. In the interim, She feels well. She denies chest pain, sob, or syncope. No ICD shocks. No palpitations. She has had a nice improvement in her LV function to 45%. She has never had an ICD shock and [facility]. Allergies  Allergen Reactions  . Avandia [Rosiglitazone] Other (See Comments)    CHF  . Celebrex [Celecoxib] Other (See Comments)    Congestive heart failure     Current Outpatient Prescriptions  Medication Sig Dispense Refill  . ALPRAZolam (XANAX) 1 MG tablet TAKE 1 TABLET UPTO 2 TIMES A DAY as needed      . carvedilol (COREG) 25 MG tablet Take 1 tablet (25 mg total) by mouth 2 (two) times daily with a meal.  60 tablet  4  . ELIQUIS 5 MG TABS tablet TAKE ONE TABLET BY MOUTH TWICE A DAY  60 tablet  3  . enalapril (VASOTEC) 10 MG tablet TAKE ONE TABLET BY MOUTH TWO TIMES DAILY  60 tablet  6  . fenofibrate 160 MG tablet Take 160 mg by mouth at bedtime.      . furosemide (LASIX) 40 MG tablet Take 1 tablet (40 mg total) by mouth 2 (two) times daily.  60 tablet  6  . insulin aspart (NOVOLOG) 100 UNIT/ML injection Inject 15-40 Units into the skin 3 (three) times daily before meals. Sliding Scale      . insulin glargine (LANTUS) 100 UNIT/ML injection Inject 80 Units into the skin daily.       Marland Kitchen. levothyroxine (SYNTHROID, LEVOTHROID) 100 MCG tablet Take 100 mcg by mouth daily.        . Liraglutide (VICTOZA [place]) Inject 1.8 Units into the skin as directed.       . multivitamin (THERAGRAN) per tablet Take 1 tablet by mouth daily.        . Omega-3 Fatty Acids (FISH OIL) 1000 MG CAPS Take 1,000 mg by mouth daily.      . potassium chloride SA (K-DUR,KLOR-CON) 20 MEQ tablet TAKE ONE TABLET BY MOUTH TWO TIMES DAILY  60 tablet  3  . simvastatin (ZOCOR) 80 MG tablet Take 1 tablet (80 mg total) by mouth at bedtime.  30 tablet  6  .  vitamin E 600 UNIT capsule Take 600 Units by mouth daily.         No current facility-administered medications for this visit.     Past Medical History  Diagnosis Date  . Chronic systolic heart failure   . Diabetes mellitus, type 2   . Hyperlipidemia   . Essential hypertension, benign   . Hypothyroidism   . PVD (peripheral vascular disease)   . Coronary atherosclerosis of native coronary artery     Multivessel, patent grafts 2007  . Ischemic cardiomyopathy     Ejection Fraction of 25% improved to 45-50% following biventicular pacing, Status post biventricular dual-chamber ICD - [employer] Livian H22  . Hypothyroidism   . Cardiac defibrillator in place   . Colon polyps     20 yrs ago  . Ulcer   . CHF (congestive heart failure)   . CAD (coronary artery disease)   . Myocardial infarction     ROS:   All systems reviewed and negative except as noted in the HPI.   Past Surgical History  Procedure Laterality Date  .  Coronary artery bypass graft  2001    [facility] - LIMA to LAD, SVG to ramus, SVG to OM/PLB, SVG to RCA   . Appendectomy    . Bilateral tubal ligation    . Cardiac defibrillator placement      [employer]  . Flexible sigmoidoscopy  07/19/1998    Dr. Karilyn Cotaehman  . Tonsillectomy    . Cesarean section       Family History  Problem Relation Age of Onset  . Diabetes Mother   . Ulcers Mother   . Heart disease Mother     before age 66  . Hyperlipidemia Mother   . Hypertension Mother   . Diabetes Sister   . Heart disease Sister     before age 66  . Heart attack Sister   . Diabetes Father   . Heart attack Father   . Hyperlipidemia Father   . Hypertension Father   . Heart disease Father     before age 66  . Coronary artery disease Sister [id number]     History   Social History  . Marital Status: Divorced    Spouse Name: N/A    Number of Children: 1  . Years of Education: N/A   Occupational History  . Full Time: [employer]   .  Disabled   .     Social History Main Topics  . Smoking status: Former Smoker    Quit date: 09/16/1991  . Smokeless tobacco: Never Used  . Alcohol Use: No  . Drug Use: No  . Sexual Activity: No   Other Topics Concern  . Not on file   Social History Narrative  . No narrative on file     BP - 132/76, P - 69, weight 255 lbs  Physical Exam:  Obese appearing 68 yo woman NAD HEENT: Unremarkable Neck:  No JVD, no thyromegally, I do not appreciate a gouter Back:  No CVA tenderness Lungs:  Clear with no wheezes. HEART:  Regular rate rhythm, no murmurs, no rubs, no clicks Abd:  Soft,obese,  positive bowel sounds, no organomegally, no rebound, no guarding Ext:  2 plus pulses, no edema, no cyanosis, no clubbing Skin:  No rashes no nodules Neuro:  CN II through XII intact, motor grossly intact   DEVICE  Normal device function.  See PaceArt for details. Patient has reverted to atrial fibrillation.[facility].  Assess/Plan:

## 2013-11-20 NOTE — CV Procedure (Signed)
Electrophysiology procedure note  Procedure: Removal of a previously implanted biventricular ICD generator, which had reached elective replacement, and insertion of a new biventricular pacemaker generator.  Preoperative diagnosis: Complete heart block, ischemic cardiomyopathy, chronic systolic heart failure, status post biventricular ICD, which now is at elective replacement. Her left ventricular function had improved to 45%, and she had never received an ICD shock.  Postoperative diagnosis: Same as preoperative diagnoses  Description of the procedure: After informed consent was obtained, the patient was taken to the diagnostic electrophysiology laboratory in the fasting state. After the usual preparation and draping, intravenous Versed and fentanyl were used for sedation. 30 cc of lidocaine was infiltrated into the left infraclavicular region. A 6 cm incision was carried out, and electrocautery was utilized to dissect down to the ICD pocket. The pocket was entered with electrocautery, and the generator was removed with gentle traction. The left ventricular lead was evaluated, and the pacing threshold was less than a volt at 0.5 ms. The right ventricular threshold was 0.9 V at 0.4 ms. The patient had underlying atrial fibrillation. The leads were removed from the ICD, and the atrial, RV rate sense, and left ventricular pacing leads were connected to the new biventricular pacemaker. The RV and SVC shocking coils were capped. The pocket was irrigated, and the new device and the old leads were placed in the subcutaneous pocket. The incision was closed with 2 layers of Vicryl suture. Benzoin and Steri-Strips were patent on the skin. The patient was returned to her room in satisfactory condition.  Complications: None be due to complications  Conclusion: Successful removal of a previous implanted biventricular ICD, followed by insertion of a new biventricular pacemaker, in a patient with complete heart block,  chronic systolic heart failure, with improvement in her left ventricular function, all in the setting of an underlying ischemic cardiomyopathy.  Lewayne BuntingGregg Taylor, M.D.

## 2013-11-20 NOTE — Interval H&P Note (Signed)
History and Physical Interval Note:  11/20/2013 9:05 AM  [name] [name]  has presented today for surgery, with the diagnosis of eol  The various methods of treatment have been discussed with the patient and family. After consideration of risks, benefits and other options for treatment, the patient has consented to  Procedure(s): BI-VENTRICULAR PACEMAKER INSERTION (CRT-P) (N/A) as a surgical intervention .  The patient's history has been reviewed, patient examined, no change in status, stable for surgery.  I have reviewed the patient's chart and labs.  Questions were answered to the patient's satisfaction.     Leonia ReevesGregg Vineet Kinney,M.D.

## 2013-11-20 NOTE — Discharge Instructions (Signed)
Pacemaker Battery Change, Care After °Refer to this sheet in the next few weeks. These instructions provide you with information on caring for yourself after your procedure. Your health care provider may also give you more specific instructions. Your treatment has been planned according to current medical practices, but problems sometimes occur. Call your health care provider if you have any problems or questions after your procedure. °WHAT TO EXPECT AFTER THE PROCEDURE °After your procedure, it is typical to have the following sensations: °· Soreness at the pacemaker site. °HOME CARE INSTRUCTIONS  °· Keep the incision clean and dry. °· Unless advised otherwise, you may shower beginning 48 hours after your procedure. °· For the first week after the replacement, avoid stretching motions that pull at the incision site, and avoid heavy exercise with the arm that is on the same side as the incision. °· Take medicines only as directed by your health care provider. °· Keep all follow-up visits as directed by your health care provider. °SEEK MEDICAL CARE IF:  °· You have pain at the incision site that is not relieved by over-the-counter or prescription medicine. °· There is drainage or pus from the incision site. °· There is swelling larger than a lime at the incision site. °· You develop red streaking that extends above or below the incision site. °· You feel brief, intermittent palpitations, light-headedness, or any symptoms that you feel might be related to your heart. °SEEK IMMEDIATE MEDICAL CARE IF:  °· You experience chest pain that is different than the pain at the pacemaker site. °· You experience shortness of breath. °· You have palpitations or irregular heartbeat. °· You have light-headedness that does not go away quickly. °· You faint. °· You have pain that gets worse and is not relieved by medicine. °Document Released: 01/18/2013 Document Revised: 08/14/2013 Document Reviewed: 01/18/2013 °[employer]® Patient  Information ©2015 [employer]. This information is not intended to replace advice given to you by your health care provider. Make sure you discuss any questions you have with your health care provider. ° °

## 2013-11-23 ENCOUNTER — Telehealth: Payer: Self-pay | Admitting: Cardiology

## 2013-11-23 NOTE — Telephone Encounter (Signed)
Pt called an stated that she has pulled the top bandage off her wound where she had a new pacemaker placed. Pt wanted to know if she could take her steri - strips off. I informed pt to not take steri strips off of site until her appt on 11-29-13. Pt verbalized understanding. I informed pt to not take a shower until after her wound check appt. Only sponge baths until that appt. Pt stated that she had already taking a shower before pulling her bandage off. But understands no showers from this point until after wound check appt.

## 2013-11-29 ENCOUNTER — Ambulatory Visit: Payer: Medicare Other

## 2013-11-30 DIAGNOSIS — L97509 Non-pressure chronic ulcer of other part of unspecified foot with unspecified severity: Secondary | ICD-10-CM | POA: Diagnosis not present

## 2013-11-30 DIAGNOSIS — E1169 Type 2 diabetes mellitus with other specified complication: Secondary | ICD-10-CM | POA: Diagnosis not present

## 2013-12-06 ENCOUNTER — Ambulatory Visit: Payer: Medicare Other

## 2013-12-07 DIAGNOSIS — E1169 Type 2 diabetes mellitus with other specified complication: Secondary | ICD-10-CM | POA: Diagnosis not present

## 2013-12-07 DIAGNOSIS — L97509 Non-pressure chronic ulcer of other part of unspecified foot with unspecified severity: Secondary | ICD-10-CM | POA: Diagnosis not present

## 2013-12-08 ENCOUNTER — Encounter: Payer: Self-pay | Admitting: Internal Medicine

## 2013-12-11 ENCOUNTER — Ambulatory Visit: Payer: Medicare Other

## 2013-12-14 ENCOUNTER — Encounter (HOSPITAL_BASED_OUTPATIENT_CLINIC_OR_DEPARTMENT_OTHER): Payer: Medicare Other | Attending: Internal Medicine

## 2013-12-14 DIAGNOSIS — L97509 Non-pressure chronic ulcer of other part of unspecified foot with unspecified severity: Secondary | ICD-10-CM | POA: Insufficient documentation

## 2013-12-14 DIAGNOSIS — E1169 Type 2 diabetes mellitus with other specified complication: Secondary | ICD-10-CM | POA: Insufficient documentation

## 2013-12-19 ENCOUNTER — Telehealth: Payer: Self-pay | Admitting: Cardiology

## 2013-12-19 ENCOUNTER — Other Ambulatory Visit: Payer: Self-pay | Admitting: Family Medicine

## 2013-12-19 MED ORDER — POTASSIUM CHLORIDE CRYS ER 20 MEQ PO TBCR: 20.0000 meq | EXTENDED_RELEASE_TABLET | Freq: Two times a day (BID) | ORAL | Status: DC

## 2013-12-19 NOTE — Telephone Encounter (Signed)
Received fax refill request  Rx # [id number] Medication:  Potassium Chloride 20 meq Qty 60 Sig:  Take one tablet by mouth twice a day Physician:  Diona Browner

## 2013-12-19 NOTE — Telephone Encounter (Signed)
Refill complete 

## 2013-12-20 ENCOUNTER — Other Ambulatory Visit: Payer: Self-pay

## 2013-12-20 MED ORDER — CARVEDILOL 25 MG PO TABS: 25.0000 mg | ORAL_TABLET | Freq: Two times a day (BID) | ORAL | Status: DC

## 2013-12-20 MED ORDER — APIXABAN 5 MG PO TABS: 5.0000 mg | ORAL_TABLET | Freq: Two times a day (BID) | ORAL | Status: DC

## 2013-12-21 DIAGNOSIS — I10 Essential (primary) hypertension: Secondary | ICD-10-CM | POA: Diagnosis not present

## 2013-12-21 DIAGNOSIS — E785 Hyperlipidemia, unspecified: Secondary | ICD-10-CM | POA: Diagnosis not present

## 2013-12-21 DIAGNOSIS — IMO0001 Reserved for inherently not codable concepts without codable children: Secondary | ICD-10-CM | POA: Diagnosis not present

## 2013-12-21 DIAGNOSIS — E890 Postprocedural hypothyroidism: Secondary | ICD-10-CM | POA: Diagnosis not present

## 2013-12-27 ENCOUNTER — Ambulatory Visit (INDEPENDENT_AMBULATORY_CARE_PROVIDER_SITE_OTHER): Payer: Medicare Other | Admitting: *Deleted

## 2013-12-27 DIAGNOSIS — I4891 Unspecified atrial fibrillation: Secondary | ICD-10-CM

## 2013-12-27 DIAGNOSIS — I429 Cardiomyopathy, unspecified: Secondary | ICD-10-CM

## 2013-12-27 DIAGNOSIS — I482 Chronic atrial fibrillation, unspecified: Secondary | ICD-10-CM

## 2013-12-28 DIAGNOSIS — E1169 Type 2 diabetes mellitus with other specified complication: Secondary | ICD-10-CM | POA: Diagnosis not present

## 2013-12-28 DIAGNOSIS — L97509 Non-pressure chronic ulcer of other part of unspecified foot with unspecified severity: Secondary | ICD-10-CM | POA: Diagnosis not present

## 2013-12-29 LAB — MDC_IDC_ENUM_SESS_TYPE_INCLINIC
Battery Remaining Longevity: 10
Brady Statistic RV Percent Paced: 94 %
Implantable Pulse Generator Serial Number: 118262
Lead Channel Impedance Value: 656 Ohm
Lead Channel Impedance Value: 829 Ohm
Lead Channel Pacing Threshold Amplitude: 0.7 V
Lead Channel Pacing Threshold Pulse Width: 0.4 ms
Lead Channel Pacing Threshold Pulse Width: 0.5 ms
Lead Channel Setting Pacing Pulse Width: 0.5 ms
Lead Channel Setting Sensing Sensitivity: 2.5 mV
MDC IDC MSMT LEADCHNL RV PACING THRESHOLD AMPLITUDE: 0.7 V
MDC IDC SET LEADCHNL LV PACING AMPLITUDE: 1.5 V
MDC IDC SET LEADCHNL RV PACING PULSEWIDTH: 0.4 ms
MDC IDC SET LEADCHNL RV SENSING SENSITIVITY: 2.5 mV
MDC IDC SET ZONE DETECTION INTERVAL: 300 ms
Zone Setting Detection Interval: 250 ms
Zone Setting Detection Interval: 342.8 ms

## 2013-12-29 NOTE — Progress Notes (Signed)
Wound check appointment. Steri-strips removed. Wound without redness or edema. Incision edges approximated, wound well healed. Normal device function. Thresholds and impedances consistent with implant measurements. Device programmed at appropriate safety margins. Histogram distribution appropriate for patient and level of activity.  Permanent AF + Eliquis. No high ventricular rates noted. Patient educated about wound care, arm mobility, lifting restrictions. ROV in 3 months with GT/R.

## 2014-01-01 ENCOUNTER — Encounter: Payer: Self-pay | Admitting: Nurse Practitioner

## 2014-01-01 ENCOUNTER — Ambulatory Visit (INDEPENDENT_AMBULATORY_CARE_PROVIDER_SITE_OTHER): Payer: Medicare Other | Admitting: Nurse Practitioner

## 2014-01-01 VITALS — BP 120/68 | Ht 67.0 in | Wt 250.0 lb

## 2014-01-01 DIAGNOSIS — Z0000 Encounter for general adult medical examination without abnormal findings: Secondary | ICD-10-CM | POA: Diagnosis not present

## 2014-01-01 DIAGNOSIS — Z23 Encounter for immunization: Secondary | ICD-10-CM | POA: Diagnosis not present

## 2014-01-01 DIAGNOSIS — Z01419 Encounter for gynecological examination (general) (routine) without abnormal findings: Secondary | ICD-10-CM

## 2014-01-01 DIAGNOSIS — Z1231 Encounter for screening mammogram for malignant neoplasm of breast: Secondary | ICD-10-CM

## 2014-01-02 ENCOUNTER — Encounter: Payer: Self-pay | Admitting: Nurse Practitioner

## 2014-01-02 ENCOUNTER — Encounter: Payer: Self-pay | Admitting: Vascular Surgery

## 2014-01-02 ENCOUNTER — Encounter: Payer: Self-pay | Admitting: Internal Medicine

## 2014-01-02 NOTE — Progress Notes (Signed)
   Subjective:    Patient ID: [name] [name], female    DOB: [birth year], 68 y.o.   MRN: [id number]  HPI presents for her wellness exam. No sexual partner for several years. No vaginal discharge. No vaginal bleeding. No pelvic pain. Patient defers vaginal and rectal exams today even though these are advised. Gets regular foot exams with her wound specialist. Sees multiple specialists including endocrinologist for her diabetes. Gets regular eye exams. Wears dentures, denies any sores or lesions in the mouth. Limited exercise but very active lifestyle, involved in the community. Up to date on all of her vaccines.    Review of Systems  Constitutional: Negative for fever, activity change, appetite change and fatigue.  HENT: Negative for ear pain, mouth sores, sinus pressure and sore throat.   Respiratory: Negative for cough, chest tightness, shortness of breath and wheezing.   Cardiovascular: Negative for chest pain and leg swelling.  Gastrointestinal: Negative for nausea, vomiting, abdominal pain, diarrhea, constipation and abdominal distention.  Genitourinary: Negative for dysuria, urgency, frequency, vaginal bleeding, vaginal discharge, enuresis, difficulty urinating, genital sores and pelvic pain.       Objective:   Physical Exam  Vitals reviewed. Constitutional: She is oriented to person, place, and time. She appears well-developed. No distress.  HENT:  Right Ear: External ear normal.  Left Ear: External ear normal.  Mouth/Throat: Oropharynx is clear and moist.  Neck: Normal range of motion. Neck supple. No tracheal deviation present. No thyromegaly present.  Cardiovascular: Normal rate, regular rhythm and normal heart sounds.  Exam reveals no gallop.   No murmur heard. Pulmonary/Chest: Effort normal and breath sounds normal.  Abdominal: Soft. She exhibits no distension. There is no tenderness.  Genitourinary:  GU and pelvic exam deferred. Also defers rectal exam.  Musculoskeletal:  She exhibits no edema.  Lymphadenopathy:    She has no cervical adenopathy.  Neurological: She is alert and oriented to person, place, and time.  Skin: Skin is warm and dry. No rash noted.  Psychiatric: She has a normal mood and affect. Her behavior is normal.   Breast exam: No masses; axillae no adenopathy. Foot exam deferred, this is done through her specialist.        Assessment & Plan:  Well woman exam  Other screening mammogram - Plan: MM DIGITAL SCREENING BILATERAL, CANCELED: MM Digital Screening  Need for prophylactic vaccination and inoculation against influenza  Recommend regular activity such as a walking program. Also healthy/diabetic diet. Strongly recommend colonoscopy, given information. Since patient does not want to do liquid prep, given information on provider that does pill prep. Return in about 4 months (around 05/03/2014). Next physical in one year to include pelvic exam.

## 2014-01-03 ENCOUNTER — Ambulatory Visit (INDEPENDENT_AMBULATORY_CARE_PROVIDER_SITE_OTHER): Payer: Medicare Other | Admitting: Vascular Surgery

## 2014-01-03 ENCOUNTER — Encounter: Payer: Self-pay | Admitting: Vascular Surgery

## 2014-01-03 VITALS — BP 110/53 | HR 70 | Resp 16 | Ht 67.0 in | Wt 249.0 lb

## 2014-01-03 DIAGNOSIS — I714 Abdominal aortic aneurysm, without rupture, unspecified: Secondary | ICD-10-CM | POA: Diagnosis not present

## 2014-01-03 DIAGNOSIS — I739 Peripheral vascular disease, unspecified: Secondary | ICD-10-CM

## 2014-01-03 DIAGNOSIS — L98499 Non-pressure chronic ulcer of skin of other sites with unspecified severity: Principal | ICD-10-CM

## 2014-01-03 DIAGNOSIS — Z48812 Encounter for surgical aftercare following surgery on the circulatory system: Secondary | ICD-10-CM | POA: Diagnosis not present

## 2014-01-03 DIAGNOSIS — I2510 Atherosclerotic heart disease of native coronary artery without angina pectoris: Secondary | ICD-10-CM | POA: Diagnosis not present

## 2014-01-03 NOTE — Progress Notes (Signed)
HPI this 68 year old female who returns for followup after recent arteriogram with left iliac artery stenting (April 2015). 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 The patient has an ulceration on the lateral aspect of her left foot. She is currently being followed by Dr. Leanord Hawking at the wound center. She states that he thought the wound had improved since stenting. She states her walking has improved significantly since her iliac stent was placed. She denies any ulcerations in the contralateral right leg. She has noticed recently a reddened area on the posterior medial aspect of her left calf. This is nontender. She is on Eliquis.    Past Medical History    Diagnosis   Date    .   Chronic systolic heart failure       .   Diabetes mellitus, type 2       .   Hyperlipidemia       .   Essential hypertension, benign       .   Hypothyroidism       .   PVD (peripheral vascular disease)       .   Coronary atherosclerosis of native coronary artery             Multivessel, patent grafts 2007    .   Ischemic cardiomyopathy             Ejection Fraction of 25% improved to 45-50% following biventicular pacing, Status post biventricular dual-chamber ICD - [employer]    .   Hypothyroidism       .   Cardiac defibrillator in place       .   Colon polyps             20 yrs ago    .   Ulcer       .   CHF (congestive heart failure)       .   CAD (coronary artery disease)       .   Myocardial infarction           History    Substance Use Topics    .   Smoking status:   Former Smoker          Quit date:   09/16/1991    .   Smokeless tobacco:   Never Used    .   Alcohol Use:   No        Family History    Problem   Relation   Age of Onset    .   Diabetes   Mother       .   Ulcers   Mother       .   Heart disease   Mother             before age 29    .   Hyperlipidemia   Mother       .    Hypertension   Mother       .   Diabetes   Sister       .   Heart disease   Sister             before age 18    .   Heart attack   Sister       .   Diabetes   Father       .   Heart attack  Father       .   Hyperlipidemia   Father       .   Hypertension   Father       .   Heart disease   Father             before age 30    .   Coronary artery disease   Sister   82        Allergies    Allergen   Reactions    .   Celebrex [Celecoxib]         Current Outpatient Prescriptions on File Prior to Visit  Medication Sig Dispense Refill  . ALPRAZolam (XANAX) 1 MG tablet Take 1 mg by mouth 2 (two) times daily as needed for anxiety.      Marland Kitchen apixaban (ELIQUIS) 5 MG TABS tablet Take 1 tablet (5 mg total) by mouth 2 (two) times daily.  60 tablet  3  . B-D INS SYR ULTRAFINE 1CC/31G 31G X 5/16" 1 ML MISC       . carvedilol (COREG) 25 MG tablet Take 1 tablet (25 mg total) by mouth 2 (two) times daily with a meal.  60 tablet  1  . Cholecalciferol (VITAMIN D) 400 UNITS capsule Take 400 Units by mouth daily.      . enalapril (VASOTEC) 10 MG tablet Take 10 mg by mouth 2 (two) times daily.      . furosemide (LASIX) 40 MG tablet Take 1 tablet (40 mg total) by mouth 2 (two) times daily.  60 tablet  6  . insulin aspart (NOVOLOG) 100 UNIT/ML injection Inject 15-40 Units into the skin 3 (three) times daily before meals. Sliding Scale      . insulin glargine (LANTUS) 100 UNIT/ML injection Inject 80 Units into the skin daily.       . INVOKANA 100 MG TABS       . levothyroxine (SYNTHROID, LEVOTHROID) 100 MCG tablet Take 100 mcg by mouth daily.        . Liraglutide (VICTOZA [place]) Inject 1.8 Units into the skin daily.       . multivitamin (THERAGRAN) per tablet Take 1 tablet by mouth daily.        . Omega-3 Fatty Acids (FISH OIL) 1000 MG CAPS Take 1,000 mg by mouth daily.      Marland Kitchen OVER THE COUNTER MEDICATION Take 1 tablet by mouth daily. Vitamin for heart health      . potassium chloride SA (K-DUR,KLOR-CON) 20 MEQ  tablet Take 1 tablet (20 mEq total) by mouth 2 (two) times daily.  60 tablet  6  . vitamin E 600 UNIT capsule Take 600 Units by mouth daily.        . fenofibrate 160 MG tablet Take 160 mg by mouth every other day.       . simvastatin (ZOCOR) 80 MG tablet Take 80 mg by mouth every other day.       No current facility-administered medications on file prior to visit.    Review of Systems patient has morbid obesity, does not ambulate long distances, has chronic edema, and previous coronary artery disease and coronary artery bypass grafting in 2009. Denies chest pain, dyspnea on exertion, PND, orthopnea, hemoptysis, other systems negative and complete review of systems     Objective:     Physical Exam     Filed Vitals:   01/03/14 1049  BP: 110/53  Pulse: 70  Resp: 16  Height:  (1.702  m)  Weight: 249 lb (112.946 kg)   Gen.-alert and oriented x3 in no apparent distress-morbidly obese HEENT normal for age Lungs no rhonchi or wheezing Cardiovascular regular rhythm no murmurs carotid pulses 3+ palpable no bruits audible Abdomen soft nontender no palpable masses-morbidly obese-large panniculus Musculoskeletal free of  major deformities Skin clear -no rashes, erythematous area left medial distal calf nontender Neurologic normal Lower extremities 3+ femoral pulses bilaterally. Patient has large panniculus and inguinal region bilaterally. No popliteal or distal pulses palpable. Left foot with ulceration over the metatarsal bone-fifth measuring 3 mm in diameter. There is no surrounding erythema no drainage is expressible Second toe amputation well healed all other toes intact.  Data: ABI June on the left side was 0.45 with monophasic flow. Vein mapping ultrasound shows the left greater saphenous vein has been completely harvested. The right greater saphenous vein is of reasonable quality 3-4 mm in diameter.    Assessment:        Nonhealing ulcer left foot in patient with diabetes mellitus.  Patient has morbid obesity which will make a bypass operation more difficult due to significant risk of wound complications. She would also need vein harvested from the contralateral leg. Patient has an implantable pacemaker and is on Eliquis daily.  Most likely the erythema in her left leg represents early stasis changes. I did discuss with her that if this worsens over time consider seeing Dr. Leanord Hawking or her primary care physician for treatment of cellulitis.     Plan:        Improved ulcer left foot with known left superficial femoral artery occlusion. Hopefully this will heal with conservative measures. The patient will followup in 6 months or sooner if the wound deteriorates. She will have a duplex of the lower extremities ABIs in of her iliac stent at the next office visit. She was given a prescription today for lower extremity compression stockings to try to decrease edema in her left lower extremity  Fabienne Bruns, MD [facility] Office: [phone] Pager: [phone]

## 2014-01-03 NOTE — Addendum Note (Signed)
Addended by: Sharee Pimple on: 01/03/2014 04:32 PM   Modules accepted: Orders

## 2014-01-04 NOTE — Addendum Note (Signed)
Addended by: Sharee Pimple on: 01/04/2014 03:40 PM   Modules accepted: Orders

## 2014-01-08 ENCOUNTER — Ambulatory Visit (HOSPITAL_COMMUNITY): Payer: Medicare Other

## 2014-01-11 ENCOUNTER — Encounter (HOSPITAL_BASED_OUTPATIENT_CLINIC_OR_DEPARTMENT_OTHER): Payer: Medicare Other | Attending: Internal Medicine

## 2014-01-11 DIAGNOSIS — E13621 Other specified diabetes mellitus with foot ulcer: Secondary | ICD-10-CM | POA: Diagnosis not present

## 2014-01-11 DIAGNOSIS — L97529 Non-pressure chronic ulcer of other part of left foot with unspecified severity: Secondary | ICD-10-CM | POA: Diagnosis not present

## 2014-01-18 ENCOUNTER — Other Ambulatory Visit: Payer: Self-pay | Admitting: Internal Medicine

## 2014-01-31 ENCOUNTER — Telehealth: Payer: Self-pay | Admitting: Cardiology

## 2014-01-31 NOTE — Telephone Encounter (Signed)
Pt called and stated that her pacemaker wound site on the left side sticks out further than the right side. Pt states that site is sore. Pt agreed to an appt in the [place] office for Friday 10-23 at 330 PM.

## 2014-02-01 DIAGNOSIS — E782 Mixed hyperlipidemia: Secondary | ICD-10-CM | POA: Diagnosis not present

## 2014-02-01 DIAGNOSIS — L97529 Non-pressure chronic ulcer of other part of left foot with unspecified severity: Secondary | ICD-10-CM | POA: Diagnosis not present

## 2014-02-01 DIAGNOSIS — I10 Essential (primary) hypertension: Secondary | ICD-10-CM | POA: Diagnosis not present

## 2014-02-01 DIAGNOSIS — E1165 Type 2 diabetes mellitus with hyperglycemia: Secondary | ICD-10-CM | POA: Diagnosis not present

## 2014-02-01 DIAGNOSIS — E038 Other specified hypothyroidism: Secondary | ICD-10-CM | POA: Diagnosis not present

## 2014-02-01 DIAGNOSIS — E13621 Other specified diabetes mellitus with foot ulcer: Secondary | ICD-10-CM | POA: Diagnosis not present

## 2014-02-02 ENCOUNTER — Ambulatory Visit: Payer: Medicare Other | Admitting: *Deleted

## 2014-02-02 DIAGNOSIS — I255 Ischemic cardiomyopathy: Secondary | ICD-10-CM

## 2014-02-02 NOTE — Progress Notes (Signed)
Pt being seen for site check due to pain at site. Site well healed with no swelling or redness. JA also evaluated and site healed nicely. Follow up as planned.

## 2014-02-05 ENCOUNTER — Encounter (HOSPITAL_COMMUNITY): Payer: Self-pay | Admitting: Emergency Medicine

## 2014-02-05 ENCOUNTER — Emergency Department (HOSPITAL_COMMUNITY)
Admission: EM | Admit: 2014-02-05 | Discharge: 2014-02-05 | Disposition: A | Discharge status: Home / Self Care | Payer: Medicare Other | Attending: Emergency Medicine | Admitting: Emergency Medicine

## 2014-02-05 ENCOUNTER — Emergency Department (HOSPITAL_COMMUNITY): Payer: Medicare Other

## 2014-02-05 DIAGNOSIS — I5022 Chronic systolic (congestive) heart failure: Secondary | ICD-10-CM | POA: Diagnosis not present

## 2014-02-05 DIAGNOSIS — E785 Hyperlipidemia, unspecified: Secondary | ICD-10-CM | POA: Insufficient documentation

## 2014-02-05 DIAGNOSIS — Z951 Presence of aortocoronary bypass graft: Secondary | ICD-10-CM | POA: Diagnosis not present

## 2014-02-05 DIAGNOSIS — Z794 Long term (current) use of insulin: Secondary | ICD-10-CM | POA: Insufficient documentation

## 2014-02-05 DIAGNOSIS — S8001XA Contusion of right knee, initial encounter: Secondary | ICD-10-CM | POA: Insufficient documentation

## 2014-02-05 DIAGNOSIS — I10 Essential (primary) hypertension: Secondary | ICD-10-CM | POA: Diagnosis not present

## 2014-02-05 DIAGNOSIS — Z79899 Other long term (current) drug therapy: Secondary | ICD-10-CM | POA: Diagnosis not present

## 2014-02-05 DIAGNOSIS — S8991XA Unspecified injury of right lower leg, initial encounter: Secondary | ICD-10-CM | POA: Diagnosis present

## 2014-02-05 DIAGNOSIS — Z95810 Presence of automatic (implantable) cardiac defibrillator: Secondary | ICD-10-CM | POA: Insufficient documentation

## 2014-02-05 DIAGNOSIS — Z87891 Personal history of nicotine dependence: Secondary | ICD-10-CM | POA: Diagnosis not present

## 2014-02-05 DIAGNOSIS — I2510 Atherosclerotic heart disease of native coronary artery without angina pectoris: Secondary | ICD-10-CM | POA: Insufficient documentation

## 2014-02-05 DIAGNOSIS — Z7901 Long term (current) use of anticoagulants: Secondary | ICD-10-CM | POA: Diagnosis not present

## 2014-02-05 DIAGNOSIS — I252 Old myocardial infarction: Secondary | ICD-10-CM | POA: Diagnosis not present

## 2014-02-05 DIAGNOSIS — E039 Hypothyroidism, unspecified: Secondary | ICD-10-CM | POA: Diagnosis not present

## 2014-02-05 DIAGNOSIS — W19XXXA Unspecified fall, initial encounter: Secondary | ICD-10-CM

## 2014-02-05 DIAGNOSIS — E119 Type 2 diabetes mellitus without complications: Secondary | ICD-10-CM | POA: Diagnosis not present

## 2014-02-05 DIAGNOSIS — Z86010 Personal history of colonic polyps: Secondary | ICD-10-CM | POA: Insufficient documentation

## 2014-02-05 DIAGNOSIS — Z872 Personal history of diseases of the skin and subcutaneous tissue: Secondary | ICD-10-CM | POA: Diagnosis not present

## 2014-02-05 DIAGNOSIS — W1839XA Other fall on same level, initial encounter: Secondary | ICD-10-CM | POA: Insufficient documentation

## 2014-02-05 DIAGNOSIS — Y929 Unspecified place or not applicable: Secondary | ICD-10-CM | POA: Diagnosis not present

## 2014-02-05 DIAGNOSIS — Y9389 Activity, other specified: Secondary | ICD-10-CM | POA: Diagnosis not present

## 2014-02-05 DIAGNOSIS — R52 Pain, unspecified: Secondary | ICD-10-CM | POA: Diagnosis not present

## 2014-02-05 DIAGNOSIS — M25561 Pain in right knee: Secondary | ICD-10-CM | POA: Diagnosis not present

## 2014-02-05 DIAGNOSIS — M25461 Effusion, right knee: Secondary | ICD-10-CM | POA: Diagnosis not present

## 2014-02-05 MED ORDER — HYDROCODONE-ACETAMINOPHEN 5-325 MG PO TABS
2.0000 | ORAL_TABLET | Freq: Once | ORAL | Status: AC
  Administered 2014-02-05: 2 via ORAL
  Filled 2014-02-05: qty 2

## 2014-02-05 MED ORDER — HYDROCODONE-ACETAMINOPHEN 5-325 MG PO TABS: 1.0000 | ORAL_TABLET | ORAL | Status: DC | PRN

## 2014-02-05 NOTE — ED Notes (Signed)
Patient fell Saturday. C/o right knee pain. EMS notes deformity to right patella.

## 2014-02-05 NOTE — ED Provider Notes (Signed)
This chart was scribed for [name] MawKristen N Capri Raben, DO by Annye AsaAnna Dorsett, ED Scribe. This patient was seen in room APA09/APA09 and the patient's care was started at 12:50 PM.   TIME SEEN: 12:50 PM  CHIEF COMPLAINT: Right knee pain   HPI:   HPI Comments: [name] [name] is a 68 y.o. female who presents to the Emergency Department complaining of right knee pain after a simple mechanical fall three days PTA. Patient denies head injury or LOC during the fall. She reports that she has pain in the affected knee that is worsened with ambulation; she has not been bearing weight on her knee since the injury. She states that she tripped over an uneven curb. No numbness or focal weakness. She is not on anti-coagulation.  ROS: See HPI Constitutional: no fever  Eyes: no drainage  ENT: no runny nose   Cardiovascular:  no chest pain  Resp: no SOB  GI: no vomiting GU: no dysuria Integumentary: no rash  Allergy: no hives  Musculoskeletal: no leg swelling  Neurological: no slurred speech ROS otherwise negative  PAST MEDICAL HISTORY/PAST SURGICAL HISTORY:  Past Medical History  Diagnosis Date  . Chronic systolic heart failure   . Diabetes mellitus, type 2   . Hyperlipidemia   . Essential hypertension, benign   . Hypothyroidism   . PVD (peripheral vascular disease)   . Coronary atherosclerosis of native coronary artery     Multivessel, patent grafts 2007  . Ischemic cardiomyopathy     Ejection Fraction of 25% improved to 45-50% following biventicular pacing, Status post biventricular dual-chamber ICD - [employer] H22  . Hypothyroidism   . Cardiac defibrillator in place   . Colon polyps     20 yrs ago  . Ulcer   . CHF (congestive heart failure)   . CAD (coronary artery disease)   . Myocardial infarction     MEDICATIONS:  Prior to Admission medications   Medication Sig Start Date End Date Taking? Authorizing Provider  ALPRAZolam Prudy Feeler(XANAX) 1 MG tablet Take 1 mg by mouth 2 (two) times  daily as needed for anxiety. 11/13/13   Merlyn AlbertWilliam S Luking, MD  apixaban (ELIQUIS) 5 MG TABS tablet Take 1 tablet (5 mg total) by mouth 2 (two) times daily. 12/20/13   Marinus MawGregg W Taylor, MD  B-D INS SYR ULTRAFINE 1CC/31G 31G X 5/16" 1 ML MISC  11/03/13   Historical Provider, MD  carvedilol (COREG) 25 MG tablet Take 1 tablet (25 mg total) by mouth 2 (two) times daily with a meal. 12/20/13   Marinus MawGregg W Taylor, MD  carvedilol (COREG) 25 MG tablet TAKE ONE TABLET TWICE DAILY WITH A MEAL 01/18/14   Marinus MawGregg W Taylor, MD  Cholecalciferol (VITAMIN D) 400 UNITS capsule Take 400 Units by mouth daily.    Historical Provider, MD  enalapril (VASOTEC) 10 MG tablet Take 10 mg by mouth 2 (two) times daily.    Historical Provider, MD  fenofibrate 160 MG tablet Take 160 mg by mouth every other day.     Historical Provider, MD  furosemide (LASIX) 40 MG tablet TAKE ONE TABLET BY MOUTH TWICE A DAY 01/18/14   Marinus MawGregg W Taylor, MD  insulin aspart (NOVOLOG) 100 UNIT/ML injection Inject 15-40 Units into the skin 3 (three) times daily before meals. Sliding Scale    Historical Provider, MD  insulin glargine (LANTUS) 100 UNIT/ML injection Inject 80 Units into the skin daily.     Historical Provider, MD  INVOKANA 100 MG TABS  12/21/13  Historical Provider, MD  levothyroxine (SYNTHROID, LEVOTHROID) 100 MCG tablet Take 100 mcg by mouth daily.      Historical Provider, MD  Liraglutide (VICTOZA [place]) Inject 1.8 Units into the skin daily.     Historical Provider, MD  multivitamin [facility](THERAGRAN) per tablet Take 1 tablet by mouth daily.      Historical Provider, MD  Omega-3 Fatty Acids (FISH OIL) 1000 MG CAPS Take 1,000 mg by mouth daily.    Historical Provider, MD  OVER THE COUNTER MEDICATION Take 1 tablet by mouth daily. Vitamin for heart health    Historical Provider, MD  potassium chloride SA (K-DUR,KLOR-CON) 20 MEQ tablet Take 1 tablet (20 mEq total) by mouth 2 (two) times daily. 12/19/13   Marinus MawGregg W Taylor, MD  simvastatin (ZOCOR) 80 MG tablet Take 80 mg by  mouth every other day. 03/08/13   Romero BellingSean Ellison, MD  vitamin E 600 UNIT capsule Take 600 Units by mouth daily.      Historical Provider, MD    ALLERGIES:  Allergies  Allergen Reactions  . Avandia [Rosiglitazone] Other (See Comments)    CHF  . Celebrex [Celecoxib] Other (See Comments)    Congestive heart failure  . Tape     Left rash per patient    SOCIAL HISTORY:  History  Substance Use Topics  . Smoking status: Former Smoker    Quit date: 09/16/1991  . Smokeless tobacco: Never Used  . Alcohol Use: No    FAMILY HISTORY: Family History  Problem Relation Age of Onset  . Diabetes Mother   . Ulcers Mother   . Heart disease Mother     before age 160  . Hyperlipidemia Mother   . Hypertension Mother   . Diabetes Sister   . Heart disease Sister     before age 960  . Heart attack Sister   . Diabetes Father   . Heart attack Father   . Hyperlipidemia Father   . Hypertension Father   . Heart disease Father     before age 66  . Coronary artery disease Sister [id number]    EXAM: BP 155/69  Pulse 70  Temp(Src) 97.8 F (36.6 C) (Oral)  Resp 19  Ht 5\' 7"  (1.702 m)  Wt 243 lb (110.224 kg)  BMI 38.05 kg/m2  SpO2 96% CONSTITUTIONAL: Alert and oriented and responds appropriately to questions. Well-appearing; well-nourished HEAD: Normocephalic EYES: Conjunctivae clear, PERRL ENT: normal nose; no rhinorrhea; moist mucous membranes; pharynx without lesions noted NECK: Supple, no meningismus, no LAD  CARD: RRR; S1 and S2 appreciated; no murmurs, no clicks, no rubs, no gallops RESP: Normal chest excursion without splinting or tachypnea; breath sounds clear and equal bilaterally; no wheezes, no rhonchi, no rales,  ABD/GI: Normal bowel sounds; non-distended; soft, non-tender, no rebound, no guarding BACK:  The back appears normal and is non-tender to palpation, there is no CVA tenderness; no midline smile to Mr. Margit BandaStubble for deformity EXT: No edema; normal capillary refill; no cyanosis; TTP  over the right knee with a moderate amount of swelling, no ecchymosis, no tenderness over right ankle or hip, 2+ DP pulse on right, normal sensation diffusely, compartments soft, no calf tenderness, unable to assess ligamentous laxity due to patient's pain and swelling SKIN: Normal color for age and race; warm NEURO: Moves all extremities equally PSYCH: The patient's mood and manner are appropriate. Grooming and personal hygiene are appropriate.   MEDICAL DECISION MAKING: Pt here after mechanical fall with right knee injury. Will obtain x-rays. No other  sign of trauma on exam. Will give Vicodin for pain.  ED PROGRESS: Patient's x-ray shows a large joint effusion but no bony injury or dislocation. She states she has a cane and walker to use at home to help with ambulation. Have offered crutches but patient declines. Have advised her to rest, keep her leg elevated and apply ice several times a day. Will give outpatient orthopedic follow-up if symptoms do not improve in the next one to two weeks. Will discharge a prescription for Vicodin. Discussed return precautions. She verbalized understanding and is comfortable with plan.   I personally performed the services described in this documentation, which was scribed in my presence. The recorded information has been reviewed and is accurate.     [name] Maw Alahna Dunne, DO 02/05/14 2236

## 2014-02-05 NOTE — Discharge Instructions (Signed)
Contusion A contusion is a deep bruise. Contusions are the result of an injury that caused bleeding under the skin. The contusion may turn blue, purple, or yellow. Minor injuries will give you a painless contusion, but more severe contusions may stay painful and swollen for a few weeks.  CAUSES  A contusion is usually caused by a blow, trauma, or direct force to an area of the body. SYMPTOMS   Swelling and redness of the injured area.  Bruising of the injured area.  Tenderness and soreness of the injured area.  Pain. DIAGNOSIS  The diagnosis can be made by taking a history and physical exam. An X-ray, CT scan, or MRI may be needed to determine if there were any associated injuries, such as fractures. TREATMENT  Specific treatment will depend on what area of the body was injured. In general, the best treatment for a contusion is resting, icing, elevating, and applying cold compresses to the injured area. Over-the-counter medicines may also be recommended for pain control. Ask your caregiver what the best treatment is for your contusion. HOME CARE INSTRUCTIONS   Put ice on the injured area.  Put ice in a plastic bag.  Place a towel between your skin and the bag.  Leave the ice on for 15-20 minutes, 3-4 times a day, or as directed by your health care provider.  Only take over-the-counter or prescription medicines for pain, discomfort, or fever as directed by your caregiver. Your caregiver may recommend avoiding anti-inflammatory medicines (aspirin, ibuprofen, and naproxen) for 48 hours because these medicines may increase bruising.  Rest the injured area.  If possible, elevate the injured area to reduce swelling. SEEK IMMEDIATE MEDICAL CARE IF:   You have increased bruising or swelling.  You have pain that is getting worse.  Your swelling or pain is not relieved with medicines. MAKE SURE YOU:   Understand these instructions.  Will watch your condition.  Will get help right  away if you are not doing well or get worse. Document Released: 01/07/2005 Document Revised: 04/04/2013 Document Reviewed: 02/02/2011 [facility] Patient Information 2015 [employer], [place]. This information is not intended to replace advice given to you by your health care provider. Make sure you discuss any questions you have with your health care provider.  Knee Effusion The medical term for having fluid in your knee is effusion. This is often due to an internal derangement of the knee. This means something is wrong inside the knee. Some of the causes of fluid in the knee may be torn cartilage, a torn ligament, or bleeding into the joint from an injury. Your knee is likely more difficult to bend and move. This is often because there is increased pain and pressure in the joint. The time it takes for recovery from a knee effusion depends on different factors, including:   Type of injury.  Your age.  Physical and medical conditions.  Rehabilitation Strategies. How long you will be away from your normal activities will depend on what kind of knee problem you have and how much damage is present. Your knee has two types of cartilage. Articular cartilage covers the bone ends and lets your knee bend and move smoothly. Two menisci, thick pads of cartilage that form a rim inside the joint, help absorb shock and stabilize your knee. Ligaments bind the bones together and support your knee joint. Muscles move the joint, help support your knee, and take stress off the joint itself. CAUSES  Often an effusion in the knee is  caused by an injury to one of the menisci. This is often a tear in the cartilage. Recovery after a meniscus injury depends on how much meniscus is damaged and whether you have damaged other knee tissue. Small tears may heal on their own with conservative treatment. Conservative means rest, limited weight bearing activity and muscle strengthening exercises. Your recovery may take up to 6 weeks.    TREATMENT  Larger tears may require surgery. Meniscus injuries may be treated during arthroscopy. Arthroscopy is a procedure in which your surgeon uses a small telescope like instrument to look in your knee. Your caregiver can make a more accurate diagnosis (learning what is wrong) by performing an arthroscopic procedure. If your injury is on the inner margin of the meniscus, your surgeon may trim the meniscus back to a smooth rim. In other cases your surgeon will try to repair a damaged meniscus with stitches (sutures). This may make rehabilitation take longer, but may provide better long term result by helping your knee keep its shock absorption capabilities. Ligaments which are completely torn usually require surgery for repair. HOME CARE INSTRUCTIONS  Use crutches as instructed.  If a brace is applied, use as directed.  Once you are home, an ice pack applied to your swollen knee may help with discomfort and help decrease swelling.  Keep your knee raised (elevated) when you are not up and around or on crutches.  Only take over-the-counter or prescription medicines for pain, discomfort, or fever as directed by your caregiver.  Your caregivers will help with instructions for rehabilitation of your knee. This often includes strengthening exercises.  You may resume a normal diet and activities as directed. SEEK MEDICAL CARE IF:   There is increased swelling in your knee.  You notice redness, swelling, or increasing pain in your knee.  An unexplained oral temperature above 102 F (38.9 C) develops. SEEK IMMEDIATE MEDICAL CARE IF:   You develop a rash.  You have difficulty breathing.  You have any allergic reactions from medications you may have been given.  There is severe pain with any motion of the knee. MAKE SURE YOU:   Understand these instructions.  Will watch your condition.  Will get help right away if you are not doing well or get worse. Document Released:  06/20/2003 Document Revised: 06/22/2011 Document Reviewed: 08/24/2007 [facility] Patient Information 2015 [place], [place]. This information is not intended to replace advice given to you by your health care provider. Make sure you discuss any questions you have with your health care provider. RICE: Routine Care for Injuries The routine care of many injuries includes Rest, Ice, Compression, and Elevation (RICE). HOME CARE INSTRUCTIONS  Rest is needed to allow your body to heal. Routine activities can usually be resumed when comfortable. Injured tendons and bones can take up to 6 weeks to heal. Tendons are the cord-like structures that attach muscle to bone.  Ice following an injury helps keep the swelling down and reduces pain.  Put ice in a plastic bag.  Place a towel between your skin and the bag.  Leave the ice on for 15-20 minutes, 3-4 times a day, or as directed by your health care provider. Do this while awake, for the first 24 to 48 hours. After that, continue as directed by your caregiver.  Compression helps keep swelling down. It also gives support and helps with discomfort. If an elastic bandage has been applied, it should be removed and reapplied every 3 to 4 hours. It should not  be applied tightly, but firmly enough to keep swelling down. Watch fingers or toes for swelling, bluish discoloration, coldness, numbness, or excessive pain. If any of these problems occur, remove the bandage and reapply loosely. Contact your caregiver if these problems continue.  Elevation helps reduce swelling and decreases pain. With extremities, such as the arms, hands, legs, and feet, the injured area should be placed near or above the level of the heart, if possible. SEEK IMMEDIATE MEDICAL CARE IF:  You have persistent pain and swelling.  You develop redness, numbness, or unexpected weakness.  Your symptoms are getting worse rather than improving after several days. These symptoms may indicate that  further evaluation or further X-rays are needed. Sometimes, X-rays may not show a small broken bone (fracture) until 1 week or 10 days later. Make a follow-up appointment with your caregiver. Ask when your X-ray results will be ready. Make sure you get your X-ray results. Document Released: 07/12/2000 Document Revised: 04/04/2013 Document Reviewed: 08/29/2010 [facility] Patient Information 2015 [place], [place]. This information is not intended to replace advice given to you by your health care provider. Make sure you discuss any questions you have with your health care provider.

## 2014-02-05 NOTE — ED Notes (Signed)
Patient with no complaints at this time. Respirations even and unlabored. Skin warm/dry. Discharge instructions reviewed with patient at this time. Patient given opportunity to voice concerns/ask questions. Patient discharged at this time and left Emergency Department with steady gait.   

## 2014-02-12 ENCOUNTER — Telehealth: Payer: Self-pay | Admitting: Family Medicine

## 2014-02-12 NOTE — Telephone Encounter (Signed)
error 

## 2014-02-14 ENCOUNTER — Ambulatory Visit: Payer: Medicare Other | Admitting: Family Medicine

## 2014-02-22 ENCOUNTER — Encounter (HOSPITAL_BASED_OUTPATIENT_CLINIC_OR_DEPARTMENT_OTHER): Payer: Medicare Other | Attending: Internal Medicine

## 2014-02-22 ENCOUNTER — Encounter: Payer: Self-pay | Admitting: Internal Medicine

## 2014-02-22 ENCOUNTER — Ambulatory Visit (INDEPENDENT_AMBULATORY_CARE_PROVIDER_SITE_OTHER): Payer: Medicare Other | Admitting: Internal Medicine

## 2014-02-22 VITALS — BP 116/64 | HR 70 | Ht 67.0 in | Wt 243.0 lb

## 2014-02-22 DIAGNOSIS — E11621 Type 2 diabetes mellitus with foot ulcer: Secondary | ICD-10-CM | POA: Diagnosis not present

## 2014-02-22 DIAGNOSIS — L97522 Non-pressure chronic ulcer of other part of left foot with fat layer exposed: Secondary | ICD-10-CM | POA: Insufficient documentation

## 2014-02-22 DIAGNOSIS — I4891 Unspecified atrial fibrillation: Secondary | ICD-10-CM

## 2014-02-22 DIAGNOSIS — I2510 Atherosclerotic heart disease of native coronary artery without angina pectoris: Secondary | ICD-10-CM | POA: Diagnosis not present

## 2014-02-22 DIAGNOSIS — I10 Essential (primary) hypertension: Secondary | ICD-10-CM | POA: Diagnosis not present

## 2014-02-22 LAB — MDC_IDC_ENUM_SESS_TYPE_INCLINIC
Brady Statistic RV Percent Paced: 95 %
Date Time Interrogation Session: 20151112050000
Implantable Pulse Generator Serial Number: 118262
Lead Channel Impedance Value: 655 Ohm
Lead Channel Impedance Value: 705 Ohm
Lead Channel Impedance Value: 852 Ohm
Lead Channel Pacing Threshold Amplitude: 0.9 V
Lead Channel Pacing Threshold Pulse Width: 0.5 ms
Lead Channel Setting Pacing Amplitude: 1.5 V
Lead Channel Setting Pacing Pulse Width: 0.4 ms
Lead Channel Setting Sensing Sensitivity: 2.5 mV
Lead Channel Setting Sensing Sensitivity: 2.5 mV
MDC IDC MSMT BATTERY REMAINING LONGEVITY: 120 mo
MDC IDC MSMT LEADCHNL RV PACING THRESHOLD AMPLITUDE: 0.6 V
MDC IDC MSMT LEADCHNL RV PACING THRESHOLD PULSEWIDTH: 0.4 ms
MDC IDC SET LEADCHNL LV PACING PULSEWIDTH: 0.5 ms
MDC IDC SET LEADCHNL RV PACING AMPLITUDE: 2.4 V
Zone Setting Detection Interval: 375 ms

## 2014-02-22 LAB — PACEMAKER DEVICE OBSERVATION

## 2014-02-22 NOTE — Patient Instructions (Addendum)
Remote monitoring is used to monitor your Pacemaker of ICD from home. This monitoring reduces the number of office visits required to check your device to one time per year. It allows us to keep an eye on the functioning of your device to ensure it is working properly. You are scheduled for a device check from home on February 15th. You may send your transmission at any time that day. If you have a wireless device, the transmission will be sent automatically. After your physician reviews your transmission, you will receive a postcard with your next transmission date.  Your physician wants you to follow-up in: 1 year with Dr. Ladona Ridgelaylor. You will receive a reminder letter in the mail two months in advance. If you don't receive a letter, please call our office to schedule the follow-up appointment.   Your physician recommends that you continue on your current medications as directed. Please refer to the Current Medication list given to you today.  Thank you for choosing  [facility]!!

## 2014-02-22 NOTE — Assessment & Plan Note (Signed)
Her blood pressure is well controlled. She has lost 10 lbs. She will continue her current meds and maintain a low sodium diet.

## 2014-02-22 NOTE — Progress Notes (Signed)
HPI [name] [name] returns today for followup. She is a pleasant 68 yo woman with a h/o ICM, chronic systolic CHF, LBBB, and now high grade heart block,  s/p ICD implant, now BiV PPM. In the interim, She feels well. She denies chest pain, sob, or syncope. No ICD shocks. No palpitations. She has had a nice improvement in her LV function to 45%. She has undergone BiV device change out going from an ICD to a PPM. She initially had some incisional soreness but this has resolved. Allergies  Allergen Reactions  . Avandia [Rosiglitazone] Other (See Comments)    CHF  . Celebrex [Celecoxib] Other (See Comments)    Congestive heart failure  . Tape     Left rash per patient     Current Outpatient Prescriptions  Medication Sig Dispense Refill  . ALPRAZolam (XANAX) 1 MG tablet Take 0.5-1 mg by mouth 2 (two) times daily as needed for anxiety.     Marland Kitchen. apixaban (ELIQUIS) 5 MG TABS tablet Take 1 tablet (5 mg total) by mouth 2 (two) times daily. 60 tablet 3  . B-D INS SYR ULTRAFINE 1CC/30G 30G X 1/2" 1 ML MISC as directed.  1  . B-D ULTRAFINE III SHORT PEN 31G X 8 MM MISC as directed.  0  . carvedilol (COREG) 25 MG tablet Take 1 tablet (25 mg total) by mouth 2 (two) times daily with a meal. 60 tablet 1  . Cholecalciferol (VITAMIN D) 400 UNITS capsule Take 400 Units by mouth daily.    . enalapril (VASOTEC) 10 MG tablet Take 10 mg by mouth 2 (two) times daily.    . furosemide (LASIX) 40 MG tablet Take 40 mg by mouth 2 (two) times daily.    . insulin aspart (NOVOLOG) 100 UNIT/ML injection Inject 20-36 Units into the skin 3 (three) times daily before meals. Sliding Scale    . insulin glargine (LANTUS) 100 UNIT/ML injection Inject 100 Units into the skin daily.     Marland Kitchen. levothyroxine (SYNTHROID, LEVOTHROID) 100 MCG tablet Take 100 mcg by mouth daily.      . Liraglutide (VICTOZA [place]) Inject 1.8 Units into the skin daily.     . multivitamin (THERAGRAN) per tablet Take 1 tablet by mouth daily.      . Omega-3 Fatty Acids  (FISH OIL) 1000 MG CAPS Take 1,000 mg by mouth daily.    Marland Kitchen. OVER THE COUNTER MEDICATION Take 1 tablet by mouth daily. Phyomega vitamin.    . potassium chloride SA (K-DUR,KLOR-CON) 20 MEQ tablet Take 1 tablet (20 mEq total) by mouth 2 (two) times daily. 60 tablet 6  . vitamin E 600 UNIT capsule Take 600 Units by mouth daily.      . INVOKANA 100 MG TABS tablet Take 1 tablet by mouth daily.     No current facility-administered medications for this visit.     Past Medical History  Diagnosis Date  . Chronic systolic heart failure   . Diabetes mellitus, type 2   . Hyperlipidemia   . Essential hypertension, benign   . Hypothyroidism   . PVD (peripheral vascular disease)   . Coronary atherosclerosis of native coronary artery     Multivessel, patent grafts 2007  . Ischemic cardiomyopathy     Ejection Fraction of 25% improved to 45-50% following biventicular pacing, Status post biventricular dual-chamber ICD - [employer] Livian H22  . Hypothyroidism   . Cardiac defibrillator in place   . Colon polyps     20 yrs ago  .  Ulcer   . CHF (congestive heart failure)   . CAD (coronary artery disease)   . Myocardial infarction     ROS:   All systems reviewed and negative except as noted in the HPI.   Past Surgical History  Procedure Laterality Date  . Coronary artery bypass graft  2001    [facility] - LIMA to LAD, SVG to ramus, SVG to OM/PLB, SVG to RCA   . Appendectomy    . Bilateral tubal ligation    . Cardiac defibrillator placement      [employer] Livian  . Flexible sigmoidoscopy  07/19/1998    Dr. Karilyn Cotaehman  . Tonsillectomy    . Cesarean section       Family History  Problem Relation Age of Onset  . Diabetes Mother   . Ulcers Mother   . Heart disease Mother     before age 66  . Hyperlipidemia Mother   . Hypertension Mother   . Diabetes Sister   . Heart disease Sister     before age 66  . Heart attack Sister   . Diabetes Father   . Heart attack Father   .  Hyperlipidemia Father   . Hypertension Father   . Heart disease Father     before age 66  . Coronary artery disease Sister [id number]     History   Social History  . Marital Status: Divorced    Spouse Name: N/A    Number of Children: 1  . Years of Education: N/A   Occupational History  . Full Time: [employer]   . Disabled   .     Social History Main Topics  . Smoking status: Former Smoker    Quit date: 09/16/1991  . Smokeless tobacco: Never Used  . Alcohol Use: No  . Drug Use: No  . Sexual Activity: No   Other Topics Concern  . Not on file   Social History Narrative     BP - 132/76, P - 69, weight 255 lbs  Physical Exam:  Obese appearing 68 yo woman NAD HEENT: Unremarkable Neck:  No JVD, no thyromegally, I do not appreciate a gouter Back:  No CVA tenderness Lungs:  Clear with no wheezes. HEART:  Regular rate rhythm, no murmurs, no rubs, no clicks Abd:  Soft,obese,  positive bowel sounds, no organomegally, no rebound, no guarding Ext:  2 plus pulses, no edema, no cyanosis, no clubbing Skin:  No rashes no nodules Neuro:  CN II through XII intact, motor grossly intact   DEVICE  Normal device function.  See PaceArt for details.  Assess/Plan:

## 2014-02-22 NOTE — Assessment & Plan Note (Signed)
She is maintaining NSR very nicely. She will continue her anti-coagulation.

## 2014-02-23 ENCOUNTER — Encounter: Payer: Medicare Other | Admitting: Internal Medicine

## 2014-03-22 ENCOUNTER — Encounter (HOSPITAL_COMMUNITY): Payer: Self-pay | Admitting: Vascular Surgery

## 2014-04-19 ENCOUNTER — Other Ambulatory Visit: Payer: Self-pay | Admitting: Cardiology

## 2014-04-19 ENCOUNTER — Other Ambulatory Visit: Payer: Self-pay | Admitting: Internal Medicine

## 2014-04-30 DIAGNOSIS — E1165 Type 2 diabetes mellitus with hyperglycemia: Secondary | ICD-10-CM | POA: Diagnosis not present

## 2014-04-30 DIAGNOSIS — E038 Other specified hypothyroidism: Secondary | ICD-10-CM | POA: Diagnosis not present

## 2014-05-18 ENCOUNTER — Encounter (HOSPITAL_BASED_OUTPATIENT_CLINIC_OR_DEPARTMENT_OTHER): Payer: Medicare Other | Attending: Internal Medicine

## 2014-05-18 DIAGNOSIS — E11621 Type 2 diabetes mellitus with foot ulcer: Secondary | ICD-10-CM | POA: Insufficient documentation

## 2014-05-18 DIAGNOSIS — L97529 Non-pressure chronic ulcer of other part of left foot with unspecified severity: Secondary | ICD-10-CM | POA: Insufficient documentation

## 2014-05-23 ENCOUNTER — Encounter: Payer: Self-pay | Admitting: Family Medicine

## 2014-05-23 ENCOUNTER — Ambulatory Visit (INDEPENDENT_AMBULATORY_CARE_PROVIDER_SITE_OTHER): Payer: Medicare Other | Admitting: Family Medicine

## 2014-05-23 VITALS — BP 112/70 | Ht 67.0 in | Wt 244.0 lb

## 2014-05-23 DIAGNOSIS — E785 Hyperlipidemia, unspecified: Secondary | ICD-10-CM | POA: Diagnosis not present

## 2014-05-23 DIAGNOSIS — I10 Essential (primary) hypertension: Secondary | ICD-10-CM | POA: Diagnosis not present

## 2014-05-23 DIAGNOSIS — E038 Other specified hypothyroidism: Secondary | ICD-10-CM

## 2014-05-23 DIAGNOSIS — I6523 Occlusion and stenosis of bilateral carotid arteries: Secondary | ICD-10-CM | POA: Diagnosis not present

## 2014-05-23 MED ORDER — ALPRAZOLAM 1 MG PO TABS: ORAL_TABLET | ORAL | Status: DC

## 2014-05-23 NOTE — Progress Notes (Signed)
   Subjective:    Patient ID: [name] [name], female    DOB: 05/20/1945, 69 y.o.   MRN: [id number]  Hypertension This is a chronic problem. The current episode started more than 1 year ago.  pt states she eats pretty heatlhy. She does work around the house for exercise.   Uses it intermittently to help her sleep always substantial anxiety. Claims compliance with Needs refill on xanax.    Sugars are doing better, seen by dr nida   Sees Dr. Fransico HimNida for diabetes. No concerns today  Patient sticking with her blood pressure medicine. No obvious side effect. Has cut down salt intake.  Compliant with her thyroid medicine.  Walking some.   Known history of coronary artery disease. No current exertional chest pain.  Review of Systems No current headache no chest pain some back pain no abdominal pain no change in bowel habits    Objective:   Physical Exam  Alert vitals stable HEENT normal blood pressure good on repeat. Lungs clear. Heart rare rhythm. Ankles without edema      Assessment & Plan:  Impression #1 hypertension control good #2 hyperlipidemia control well per patient discussed #3 hypothyroidism discussed #4 coronary artery disease no current symptomatology followed by cardiologist #5 insomnia/anxiety with good control with medicine plan diet exercise discussed. Maintain same medications. 25 minutes spent most in discussion. Strongly encouraged to do preventative health checkups. WSL

## 2014-05-28 ENCOUNTER — Telehealth: Payer: Self-pay | Admitting: Cardiology

## 2014-05-28 ENCOUNTER — Telehealth: Payer: Self-pay | Admitting: Internal Medicine

## 2014-05-28 ENCOUNTER — Ambulatory Visit (INDEPENDENT_AMBULATORY_CARE_PROVIDER_SITE_OTHER): Payer: Medicare Other | Admitting: *Deleted

## 2014-05-28 DIAGNOSIS — I4891 Unspecified atrial fibrillation: Secondary | ICD-10-CM

## 2014-05-28 LAB — MDC_IDC_ENUM_SESS_TYPE_REMOTE
Battery Remaining Longevity: 120 mo
Battery Remaining Percentage: 100 %
Brady Statistic RV Percent Paced: 87 %
Implantable Pulse Generator Serial Number: 118262
Lead Channel Impedance Value: 605 Ohm
Lead Channel Impedance Value: 758 Ohm
Lead Channel Impedance Value: 758 Ohm
Lead Channel Pacing Threshold Amplitude: 0.9 V
Lead Channel Setting Pacing Amplitude: 1.5 V
Lead Channel Setting Pacing Pulse Width: 0.4 ms
Lead Channel Setting Sensing Sensitivity: 2.5 mV
Lead Channel Setting Sensing Sensitivity: 2.5 mV
MDC IDC MSMT LEADCHNL LV PACING THRESHOLD PULSEWIDTH: 0.5 ms
MDC IDC MSMT LEADCHNL RV IMPEDANCE VALUE: 626 Ohm
MDC IDC SESS DTM: 20160215191100
MDC IDC SET LEADCHNL LV PACING PULSEWIDTH: 0.5 ms
MDC IDC SET LEADCHNL RV PACING AMPLITUDE: 2.4 V
MDC IDC STAT BRADY RA PERCENT PACED: 0 %
Zone Setting Detection Interval: 375 ms

## 2014-05-28 NOTE — Telephone Encounter (Signed)
Spoke with pt and reminded pt of remote transmission that is due today. Pt verbalized understanding.   

## 2014-05-28 NOTE — Progress Notes (Signed)
Remote pacemaker transmission.   

## 2014-05-28 NOTE — Telephone Encounter (Signed)
New Msg       Pt calling, not sure if remote pacer check was successful.   Pt is seeing flashing yellow lights   Please return pt call.

## 2014-05-28 NOTE — Telephone Encounter (Signed)
Informed pt that transmission was not received. Gave pt tech service number to call to receive help trouble shoot monitor.

## 2014-05-29 ENCOUNTER — Encounter: Payer: Self-pay | Admitting: Orthopedic Surgery

## 2014-05-29 ENCOUNTER — Other Ambulatory Visit: Payer: Self-pay | Admitting: Internal Medicine

## 2014-05-29 ENCOUNTER — Ambulatory Visit: Payer: Medicare Other | Admitting: Orthopedic Surgery

## 2014-06-01 DIAGNOSIS — E785 Hyperlipidemia, unspecified: Secondary | ICD-10-CM | POA: Diagnosis not present

## 2014-06-01 DIAGNOSIS — E6609 Other obesity due to excess calories: Secondary | ICD-10-CM | POA: Diagnosis not present

## 2014-06-01 DIAGNOSIS — I10 Essential (primary) hypertension: Secondary | ICD-10-CM | POA: Diagnosis not present

## 2014-06-01 DIAGNOSIS — E039 Hypothyroidism, unspecified: Secondary | ICD-10-CM | POA: Diagnosis not present

## 2014-06-01 DIAGNOSIS — E1165 Type 2 diabetes mellitus with hyperglycemia: Secondary | ICD-10-CM | POA: Diagnosis not present

## 2014-06-08 ENCOUNTER — Encounter: Payer: Self-pay | Admitting: *Deleted

## 2014-06-08 DIAGNOSIS — E11621 Type 2 diabetes mellitus with foot ulcer: Secondary | ICD-10-CM | POA: Diagnosis not present

## 2014-06-08 DIAGNOSIS — L97529 Non-pressure chronic ulcer of other part of left foot with unspecified severity: Secondary | ICD-10-CM | POA: Diagnosis not present

## 2014-06-18 ENCOUNTER — Other Ambulatory Visit: Payer: Self-pay | Admitting: Internal Medicine

## 2014-06-19 ENCOUNTER — Encounter: Payer: Self-pay | Admitting: Internal Medicine

## 2014-06-22 ENCOUNTER — Encounter (HOSPITAL_BASED_OUTPATIENT_CLINIC_OR_DEPARTMENT_OTHER): Payer: Medicare Other | Attending: Internal Medicine

## 2014-06-22 DIAGNOSIS — L97529 Non-pressure chronic ulcer of other part of left foot with unspecified severity: Secondary | ICD-10-CM | POA: Diagnosis not present

## 2014-06-22 DIAGNOSIS — E11621 Type 2 diabetes mellitus with foot ulcer: Secondary | ICD-10-CM | POA: Insufficient documentation

## 2014-06-25 ENCOUNTER — Other Ambulatory Visit: Payer: Self-pay | Admitting: *Deleted

## 2014-06-25 MED ORDER — ENALAPRIL MALEATE 10 MG PO TABS: 10.0000 mg | ORAL_TABLET | Freq: Two times a day (BID) | ORAL | Status: DC

## 2014-07-12 DIAGNOSIS — L97529 Non-pressure chronic ulcer of other part of left foot with unspecified severity: Secondary | ICD-10-CM | POA: Diagnosis not present

## 2014-07-12 DIAGNOSIS — E11621 Type 2 diabetes mellitus with foot ulcer: Secondary | ICD-10-CM | POA: Diagnosis not present

## 2014-07-12 DIAGNOSIS — E1169 Type 2 diabetes mellitus with other specified complication: Secondary | ICD-10-CM | POA: Diagnosis not present

## 2014-07-18 ENCOUNTER — Other Ambulatory Visit: Payer: Self-pay | Admitting: *Deleted

## 2014-07-18 ENCOUNTER — Encounter: Payer: Self-pay | Admitting: Vascular Surgery

## 2014-07-18 DIAGNOSIS — I739 Peripheral vascular disease, unspecified: Secondary | ICD-10-CM

## 2014-07-18 DIAGNOSIS — Z48812 Encounter for surgical aftercare following surgery on the circulatory system: Secondary | ICD-10-CM

## 2014-07-19 ENCOUNTER — Ambulatory Visit (INDEPENDENT_AMBULATORY_CARE_PROVIDER_SITE_OTHER)
Admission: RE | Admit: 2014-07-19 | Discharge: 2014-07-19 | Disposition: A | Discharge status: Home / Self Care | Payer: Medicare Other | Source: Ambulatory Visit | Attending: Vascular Surgery | Admitting: Vascular Surgery

## 2014-07-19 ENCOUNTER — Encounter (HOSPITAL_BASED_OUTPATIENT_CLINIC_OR_DEPARTMENT_OTHER): Payer: Medicare Other | Attending: Internal Medicine

## 2014-07-19 ENCOUNTER — Other Ambulatory Visit: Payer: Self-pay | Admitting: Vascular Surgery

## 2014-07-19 ENCOUNTER — Ambulatory Visit (HOSPITAL_COMMUNITY)
Admission: RE | Admit: 2014-07-19 | Discharge: 2014-07-19 | Disposition: A | Discharge status: Home / Self Care | Payer: Medicare Other | Source: Ambulatory Visit | Attending: Internal Medicine | Admitting: Internal Medicine

## 2014-07-19 ENCOUNTER — Ambulatory Visit (HOSPITAL_COMMUNITY)
Admission: RE | Admit: 2014-07-19 | Discharge: 2014-07-19 | Disposition: A | Discharge status: Home / Self Care | Payer: Medicare Other | Source: Ambulatory Visit | Attending: Vascular Surgery | Admitting: Vascular Surgery

## 2014-07-19 ENCOUNTER — Other Ambulatory Visit: Payer: Self-pay | Admitting: Internal Medicine

## 2014-07-19 ENCOUNTER — Ambulatory Visit (INDEPENDENT_AMBULATORY_CARE_PROVIDER_SITE_OTHER): Payer: Medicare Other | Admitting: Vascular Surgery

## 2014-07-19 ENCOUNTER — Ambulatory Visit: Payer: Medicare Other | Admitting: Podiatry

## 2014-07-19 ENCOUNTER — Encounter: Payer: Self-pay | Admitting: Vascular Surgery

## 2014-07-19 VITALS — BP 139/62 | HR 70 | Temp 98.1°F | Ht 67.0 in | Wt 238.0 lb

## 2014-07-19 DIAGNOSIS — I6523 Occlusion and stenosis of bilateral carotid arteries: Secondary | ICD-10-CM

## 2014-07-19 DIAGNOSIS — L98499 Non-pressure chronic ulcer of skin of other sites with unspecified severity: Secondary | ICD-10-CM | POA: Diagnosis not present

## 2014-07-19 DIAGNOSIS — I7025 Atherosclerosis of native arteries of other extremities with ulceration: Secondary | ICD-10-CM

## 2014-07-19 DIAGNOSIS — A4901 Methicillin susceptible Staphylococcus aureus infection, unspecified site: Secondary | ICD-10-CM | POA: Diagnosis not present

## 2014-07-19 DIAGNOSIS — L97921 Non-pressure chronic ulcer of unspecified part of left lower leg limited to breakdown of skin: Secondary | ICD-10-CM | POA: Diagnosis not present

## 2014-07-19 DIAGNOSIS — Z48812 Encounter for surgical aftercare following surgery on the circulatory system: Secondary | ICD-10-CM

## 2014-07-19 DIAGNOSIS — I739 Peripheral vascular disease, unspecified: Secondary | ICD-10-CM | POA: Diagnosis not present

## 2014-07-19 DIAGNOSIS — R0989 Other specified symptoms and signs involving the circulatory and respiratory systems: Secondary | ICD-10-CM | POA: Diagnosis not present

## 2014-07-19 DIAGNOSIS — I714 Abdominal aortic aneurysm, without rupture, unspecified: Secondary | ICD-10-CM

## 2014-07-19 DIAGNOSIS — Z1629 Resistance to other single specified antibiotic: Secondary | ICD-10-CM | POA: Diagnosis not present

## 2014-07-19 DIAGNOSIS — M869 Osteomyelitis, unspecified: Secondary | ICD-10-CM

## 2014-07-19 DIAGNOSIS — E11621 Type 2 diabetes mellitus with foot ulcer: Secondary | ICD-10-CM | POA: Insufficient documentation

## 2014-07-19 DIAGNOSIS — S91302A Unspecified open wound, left foot, initial encounter: Secondary | ICD-10-CM | POA: Diagnosis not present

## 2014-07-19 NOTE — Progress Notes (Signed)
HPI this 69 year old female who returns for followup after recent arteriogram with left iliac artery stenting (April 2015). 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 The patient has an ulceration on the lateral aspect of her left foot. She is currently being followed by Dr. Leanord Hawking at the wound center. The ulcer had completely healed. It then broke down again 2 weeks ago. She states that he thought the wound had improved since stenting. She states her walking has improved significantly since her iliac stent was placed. However, she fell recently and is currently walking with a cane. She denies any ulcerations in the contralateral right leg.  She is on Eliquis. chronic medical problems include congestive heart failure, diabetes, hyperlipidemia, hypertension, ischemic cardiomyopathy with ICD.  All these problems are currently stable.     Past Medical History     Diagnosis    Date     .    Chronic systolic heart failure         .    Diabetes mellitus, type 2         .    Hyperlipidemia         .    Essential hypertension, benign         .    Hypothyroidism         .    PVD (peripheral vascular disease)         .    Coronary atherosclerosis of native coronary artery                 Multivessel, patent grafts 2007     .    Ischemic cardiomyopathy                 Ejection Fraction of 25% improved to 45-50% following biventicular pacing, Status post biventricular dual-chamber ICD - [employer]     .    Hypothyroidism         .    Cardiac defibrillator in place         .    Colon polyps                 20 yrs ago     .    Ulcer         .    CHF (congestive heart failure)         .    CAD (coronary artery disease)         .    Myocardial infarction             History     Substance Use Topics     .    Smoking status:    Former Smoker             Quit date:    09/16/1991     .     Smokeless tobacco:    Never Used     .    Alcohol Use:    No         Family History     Problem    Relation    Age of Onset     .    Diabetes    Mother         .    Ulcers    Mother         .    Heart disease    Mother  before age 65     .    Hyperlipidemia    Mother         .    Hypertension    Mother         .    Diabetes    Sister         .    Heart disease    Sister                 before age 39     .    Heart attack    Sister         .    Diabetes    Father         .    Heart attack    Father         .    Hyperlipidemia    Father         .    Hypertension    Father         .    Heart disease    Father                 before age 33     .    Coronary artery disease    Sister    70         Allergies     Allergen    Reactions     .    Celebrex [Celecoxib]          Current Outpatient Prescriptions on File Prior to Visit  Medication Sig Dispense Refill  . ALPRAZolam (XANAX) 1 MG tablet Take 0.5 - 1 bid prn anxiety 60 tablet 5  . B-D INS SYR ULTRAFINE 1CC/30G 30G X 1/2" 1 ML MISC as directed.  1  . B-D ULTRAFINE III SHORT PEN 31G X 8 MM MISC as directed.  0  . carvedilol (COREG) 25 MG tablet TAKE ONE TABLET TWICE DAILY WITH A MEAL 60 tablet 11  . Cholecalciferol (VITAMIN D) 400 UNITS capsule Take 400 Units by mouth daily.    Marland Kitchen ELIQUIS 5 MG TABS tablet TAKE ONE TABLET TWICE DAILY 60 tablet 11  . enalapril (VASOTEC) 10 MG tablet Take 1 tablet (10 mg total) by mouth 2 (two) times daily. 180 tablet 3  . furosemide (LASIX) 40 MG tablet Take 40 mg by mouth 2 (two) times daily.    . furosemide (LASIX) 40 MG tablet TAKE ONE TABLET BY MOUTH TWICE A DAY 60 tablet 6  . insulin aspart (NOVOLOG) 100 UNIT/ML injection Inject 20-36 Units into the skin 3 (three) times daily before meals. Sliding Scale    . insulin glargine (LANTUS) 100 UNIT/ML injection Inject 100 Units into the skin daily.     Marland Kitchen levothyroxine (SYNTHROID, LEVOTHROID) 100 MCG tablet Take 100 mcg by mouth daily.       . Liraglutide (VICTOZA [place]) Inject 1.8 Units into the skin daily.     . multivitamin (THERAGRAN) per tablet Take 1 tablet by mouth daily.      . Omega-3 Fatty Acids (FISH OIL) 1000 MG CAPS Take 1,000 mg by mouth daily.    Marland Kitchen OVER THE COUNTER MEDICATION Take 1 tablet by mouth daily. Phyomega vitamin.    . potassium chloride SA (K-DUR,KLOR-CON) 20 MEQ tablet Take 1 tablet (20 mEq total) by mouth 2 (two) times daily. 60 tablet 6  . vitamin E 600 UNIT capsule Take 600 Units by mouth daily.  No current facility-administered medications on file prior to visit.    Review of Systems patient has morbid obesity, does not ambulate long distances, has chronic edema, and previous coronary artery disease and coronary artery bypass grafting in 2009. Denies chest pain, dyspnea on exertion, PND, orthopnea, hemoptysis, other systems negative and complete review of systems     Objective:      Physical Exam     Filed Vitals:   07/19/14 1054 07/19/14 1058  BP: 151/67 139/62  Pulse: 70 70  Temp: 98.1 F (36.7 C)   TempSrc: Oral   Height: 5\' 7"  (1.702 m)   Weight: 238 lb (107.956 kg)   SpO2: 100%    Gen.-alert and oriented x3 in no apparent distress-morbidly obese HEENT normal for age Lungs no rhonchi or wheezing Cardiovascular regular rhythm no murmurs carotid pulses 2+ palpable bilateral carotid bruits  Abdomen soft nontender no palpable masses-morbidly obese-large panniculus Musculoskeletal free of  major deformities Skin clear -no rashes, erythematous area left medial distal calf nontender Neurologic normal Lower extremities 2+ femoral pulses bilaterally. Patient has large panniculus and inguinal region bilaterally. No popliteal or distal pulses palpable. Left foot with ulceration over the metatarsal bone-fifth measuring 2 mm in diameter. There is no surrounding erythema no drainage is expressible Second toe amputation well healed all other toes intact.  Data: ABI  left side was 0.48 with  monophasic flow.  right side 0.5 to reviewed and interpreted this study today.  Prior previous Vein mapping ultrasound shows the left greater saphenous vein has been completely harvested. The right greater saphenous vein is of reasonable quality 3-4 mm in diameter.    Assessment:         Nonhealing ulcer left foot in patient with diabetes mellitus. Patient has morbid obesity which will make a bypass operation more difficult due to significant risk of wound complications. She would also need vein harvested from the contralateral leg.  she will follow-up with me in 4-6 weeks if the wound has not completely healed. This is currently being followed by Dr. Gordy Levanobeson.    Patient has an implantable pacemaker and is on Eliquis daily.   Bilateral carotid bruits. Patient had a carotid duplex exam in 2007 which showed bilateral 50-70% carotid stenosis. She needs a repeat carotid duplex exam. She is asymptomatic. She will schedule this in the next couple of weeks.      Plan:          see above   Fabienne Brunsharles Fields, MD [facility] Office: [phone] Pager: [phone]

## 2014-07-19 NOTE — Addendum Note (Signed)
Addended by: Sharee PimpleMCCHESNEY, MARILYN K on: 07/19/2014 02:56 PM   Modules accepted: Orders

## 2014-07-22 ENCOUNTER — Encounter: Payer: Self-pay | Admitting: Family Medicine

## 2014-07-26 DIAGNOSIS — Z1629 Resistance to other single specified antibiotic: Secondary | ICD-10-CM | POA: Diagnosis not present

## 2014-07-26 DIAGNOSIS — E11621 Type 2 diabetes mellitus with foot ulcer: Secondary | ICD-10-CM | POA: Diagnosis not present

## 2014-07-26 DIAGNOSIS — A4901 Methicillin susceptible Staphylococcus aureus infection, unspecified site: Secondary | ICD-10-CM | POA: Diagnosis not present

## 2014-07-26 DIAGNOSIS — L97921 Non-pressure chronic ulcer of unspecified part of left lower leg limited to breakdown of skin: Secondary | ICD-10-CM | POA: Diagnosis not present

## 2014-08-02 ENCOUNTER — Other Ambulatory Visit (HOSPITAL_COMMUNITY): Payer: Medicare Other

## 2014-08-02 DIAGNOSIS — Z1629 Resistance to other single specified antibiotic: Secondary | ICD-10-CM | POA: Diagnosis not present

## 2014-08-02 DIAGNOSIS — A4901 Methicillin susceptible Staphylococcus aureus infection, unspecified site: Secondary | ICD-10-CM | POA: Diagnosis not present

## 2014-08-02 DIAGNOSIS — L97921 Non-pressure chronic ulcer of unspecified part of left lower leg limited to breakdown of skin: Secondary | ICD-10-CM | POA: Diagnosis not present

## 2014-08-02 DIAGNOSIS — E11621 Type 2 diabetes mellitus with foot ulcer: Secondary | ICD-10-CM | POA: Diagnosis not present

## 2014-08-09 DIAGNOSIS — Z1629 Resistance to other single specified antibiotic: Secondary | ICD-10-CM | POA: Diagnosis not present

## 2014-08-09 DIAGNOSIS — E1169 Type 2 diabetes mellitus with other specified complication: Secondary | ICD-10-CM | POA: Diagnosis not present

## 2014-08-09 DIAGNOSIS — A4901 Methicillin susceptible Staphylococcus aureus infection, unspecified site: Secondary | ICD-10-CM | POA: Diagnosis not present

## 2014-08-09 DIAGNOSIS — E11621 Type 2 diabetes mellitus with foot ulcer: Secondary | ICD-10-CM | POA: Diagnosis not present

## 2014-08-09 DIAGNOSIS — L97921 Non-pressure chronic ulcer of unspecified part of left lower leg limited to breakdown of skin: Secondary | ICD-10-CM | POA: Diagnosis not present

## 2014-08-16 ENCOUNTER — Encounter (HOSPITAL_BASED_OUTPATIENT_CLINIC_OR_DEPARTMENT_OTHER): Payer: Medicare Other | Attending: Internal Medicine

## 2014-08-16 DIAGNOSIS — E11621 Type 2 diabetes mellitus with foot ulcer: Secondary | ICD-10-CM | POA: Insufficient documentation

## 2014-08-16 DIAGNOSIS — L97524 Non-pressure chronic ulcer of other part of left foot with necrosis of bone: Secondary | ICD-10-CM | POA: Diagnosis not present

## 2014-08-16 DIAGNOSIS — Z950 Presence of cardiac pacemaker: Secondary | ICD-10-CM | POA: Insufficient documentation

## 2014-08-27 ENCOUNTER — Telehealth: Payer: Self-pay | Admitting: Cardiology

## 2014-08-27 ENCOUNTER — Encounter: Payer: Medicare Other | Admitting: *Deleted

## 2014-08-27 NOTE — Telephone Encounter (Signed)
LMOVM reminding pt to send remote transmission.   

## 2014-08-29 ENCOUNTER — Encounter (HOSPITAL_COMMUNITY): Payer: Medicare Other

## 2014-08-30 DIAGNOSIS — E11621 Type 2 diabetes mellitus with foot ulcer: Secondary | ICD-10-CM | POA: Diagnosis not present

## 2014-08-30 DIAGNOSIS — L97524 Non-pressure chronic ulcer of other part of left foot with necrosis of bone: Secondary | ICD-10-CM | POA: Diagnosis not present

## 2014-08-30 DIAGNOSIS — Z950 Presence of cardiac pacemaker: Secondary | ICD-10-CM | POA: Diagnosis not present

## 2014-08-31 ENCOUNTER — Encounter: Payer: Self-pay | Admitting: Cardiology

## 2014-09-04 ENCOUNTER — Encounter (HOSPITAL_COMMUNITY): Payer: Medicare Other

## 2014-09-07 DIAGNOSIS — L97524 Non-pressure chronic ulcer of other part of left foot with necrosis of bone: Secondary | ICD-10-CM | POA: Diagnosis not present

## 2014-09-07 DIAGNOSIS — E11621 Type 2 diabetes mellitus with foot ulcer: Secondary | ICD-10-CM | POA: Diagnosis not present

## 2014-09-07 DIAGNOSIS — Z950 Presence of cardiac pacemaker: Secondary | ICD-10-CM | POA: Diagnosis not present

## 2014-09-13 ENCOUNTER — Telehealth: Payer: Self-pay | Admitting: Internal Medicine

## 2014-09-13 NOTE — Telephone Encounter (Signed)
New Message  Pt had remote- no show on 5/16. Pt does not know how to complete this appt and needs to resch. Please call back and discuss.

## 2014-09-13 NOTE — Telephone Encounter (Signed)
Instructed pt how to send manual transmission. Gave her tech service number to [employer] to call if she needs additional help. Pt verbalized understanding.

## 2014-09-14 ENCOUNTER — Encounter (HOSPITAL_BASED_OUTPATIENT_CLINIC_OR_DEPARTMENT_OTHER): Payer: Medicare Other | Attending: Internal Medicine

## 2014-09-14 DIAGNOSIS — E11621 Type 2 diabetes mellitus with foot ulcer: Secondary | ICD-10-CM | POA: Insufficient documentation

## 2014-09-14 DIAGNOSIS — E1140 Type 2 diabetes mellitus with diabetic neuropathy, unspecified: Secondary | ICD-10-CM | POA: Insufficient documentation

## 2014-09-14 DIAGNOSIS — H538 Other visual disturbances: Secondary | ICD-10-CM | POA: Diagnosis not present

## 2014-09-14 DIAGNOSIS — L97521 Non-pressure chronic ulcer of other part of left foot limited to breakdown of skin: Secondary | ICD-10-CM | POA: Insufficient documentation

## 2014-09-14 DIAGNOSIS — L84 Corns and callosities: Secondary | ICD-10-CM | POA: Insufficient documentation

## 2014-09-14 DIAGNOSIS — E119 Type 2 diabetes mellitus without complications: Secondary | ICD-10-CM | POA: Diagnosis not present

## 2014-09-14 DIAGNOSIS — Z950 Presence of cardiac pacemaker: Secondary | ICD-10-CM | POA: Insufficient documentation

## 2014-09-20 ENCOUNTER — Other Ambulatory Visit: Payer: Self-pay | Admitting: Internal Medicine

## 2014-09-21 DIAGNOSIS — L97521 Non-pressure chronic ulcer of other part of left foot limited to breakdown of skin: Secondary | ICD-10-CM | POA: Diagnosis not present

## 2014-09-21 DIAGNOSIS — L84 Corns and callosities: Secondary | ICD-10-CM | POA: Diagnosis not present

## 2014-09-21 DIAGNOSIS — E1140 Type 2 diabetes mellitus with diabetic neuropathy, unspecified: Secondary | ICD-10-CM | POA: Diagnosis not present

## 2014-09-21 DIAGNOSIS — Z950 Presence of cardiac pacemaker: Secondary | ICD-10-CM | POA: Diagnosis not present

## 2014-09-21 DIAGNOSIS — E11621 Type 2 diabetes mellitus with foot ulcer: Secondary | ICD-10-CM | POA: Diagnosis not present

## 2014-10-04 DIAGNOSIS — L97521 Non-pressure chronic ulcer of other part of left foot limited to breakdown of skin: Secondary | ICD-10-CM | POA: Diagnosis not present

## 2014-10-04 DIAGNOSIS — E11621 Type 2 diabetes mellitus with foot ulcer: Secondary | ICD-10-CM | POA: Diagnosis not present

## 2014-10-04 DIAGNOSIS — L84 Corns and callosities: Secondary | ICD-10-CM | POA: Diagnosis not present

## 2014-10-04 DIAGNOSIS — Z950 Presence of cardiac pacemaker: Secondary | ICD-10-CM | POA: Diagnosis not present

## 2014-10-04 DIAGNOSIS — E1140 Type 2 diabetes mellitus with diabetic neuropathy, unspecified: Secondary | ICD-10-CM | POA: Diagnosis not present

## 2014-10-11 ENCOUNTER — Encounter: Payer: Self-pay | Admitting: Internal Medicine

## 2014-10-11 ENCOUNTER — Ambulatory Visit (INDEPENDENT_AMBULATORY_CARE_PROVIDER_SITE_OTHER): Payer: Medicare Other | Admitting: *Deleted

## 2014-10-11 DIAGNOSIS — I255 Ischemic cardiomyopathy: Secondary | ICD-10-CM

## 2014-10-17 NOTE — Progress Notes (Signed)
Remote pacemaker transmission.   

## 2014-10-18 ENCOUNTER — Encounter (HOSPITAL_BASED_OUTPATIENT_CLINIC_OR_DEPARTMENT_OTHER): Payer: Medicare Other | Attending: Internal Medicine

## 2014-10-18 DIAGNOSIS — I739 Peripheral vascular disease, unspecified: Secondary | ICD-10-CM | POA: Insufficient documentation

## 2014-10-18 DIAGNOSIS — I10 Essential (primary) hypertension: Secondary | ICD-10-CM | POA: Insufficient documentation

## 2014-10-18 DIAGNOSIS — E1140 Type 2 diabetes mellitus with diabetic neuropathy, unspecified: Secondary | ICD-10-CM | POA: Insufficient documentation

## 2014-10-18 DIAGNOSIS — I70245 Atherosclerosis of native arteries of left leg with ulceration of other part of foot: Secondary | ICD-10-CM | POA: Insufficient documentation

## 2014-10-18 DIAGNOSIS — I509 Heart failure, unspecified: Secondary | ICD-10-CM | POA: Insufficient documentation

## 2014-10-18 DIAGNOSIS — I2510 Atherosclerotic heart disease of native coronary artery without angina pectoris: Secondary | ICD-10-CM | POA: Insufficient documentation

## 2014-10-18 DIAGNOSIS — E1169 Type 2 diabetes mellitus with other specified complication: Secondary | ICD-10-CM | POA: Insufficient documentation

## 2014-10-25 DIAGNOSIS — I10 Essential (primary) hypertension: Secondary | ICD-10-CM | POA: Diagnosis not present

## 2014-10-25 DIAGNOSIS — I70245 Atherosclerosis of native arteries of left leg with ulceration of other part of foot: Secondary | ICD-10-CM | POA: Diagnosis not present

## 2014-10-25 DIAGNOSIS — E1140 Type 2 diabetes mellitus with diabetic neuropathy, unspecified: Secondary | ICD-10-CM | POA: Diagnosis not present

## 2014-10-25 DIAGNOSIS — I739 Peripheral vascular disease, unspecified: Secondary | ICD-10-CM | POA: Diagnosis not present

## 2014-10-25 DIAGNOSIS — E1169 Type 2 diabetes mellitus with other specified complication: Secondary | ICD-10-CM | POA: Diagnosis not present

## 2014-10-25 DIAGNOSIS — I509 Heart failure, unspecified: Secondary | ICD-10-CM | POA: Diagnosis not present

## 2014-10-25 DIAGNOSIS — I2510 Atherosclerotic heart disease of native coronary artery without angina pectoris: Secondary | ICD-10-CM | POA: Diagnosis not present

## 2014-10-26 LAB — CUP PACEART REMOTE DEVICE CHECK
Battery Remaining Percentage: 100 %
Brady Statistic RA Percent Paced: 0 %
Lead Channel Impedance Value: 668 Ohm
Lead Channel Pacing Threshold Pulse Width: 0.5 ms
Lead Channel Setting Pacing Amplitude: 1.5 V
Lead Channel Setting Pacing Pulse Width: 0.4 ms
Lead Channel Setting Pacing Pulse Width: 0.5 ms
Lead Channel Setting Sensing Sensitivity: 2.5 mV
Lead Channel Setting Sensing Sensitivity: 2.5 mV
MDC IDC MSMT BATTERY REMAINING LONGEVITY: 120 mo
MDC IDC MSMT LEADCHNL LV IMPEDANCE VALUE: 840 Ohm
MDC IDC MSMT LEADCHNL LV PACING THRESHOLD AMPLITUDE: 0.9 V
MDC IDC MSMT LEADCHNL RA IMPEDANCE VALUE: 622 Ohm
MDC IDC SESS DTM: 20160630123800
MDC IDC SET LEADCHNL RV PACING AMPLITUDE: 2.4 V
MDC IDC STAT BRADY RV PERCENT PACED: 90 %
Pulse Gen Serial Number: 118262
Zone Setting Detection Interval: 375 ms

## 2014-11-07 ENCOUNTER — Encounter: Payer: Self-pay | Admitting: *Deleted

## 2014-11-07 DIAGNOSIS — E782 Mixed hyperlipidemia: Secondary | ICD-10-CM | POA: Diagnosis not present

## 2014-11-07 DIAGNOSIS — E038 Other specified hypothyroidism: Secondary | ICD-10-CM | POA: Diagnosis not present

## 2014-11-07 DIAGNOSIS — I10 Essential (primary) hypertension: Secondary | ICD-10-CM | POA: Diagnosis not present

## 2014-11-07 DIAGNOSIS — E1165 Type 2 diabetes mellitus with hyperglycemia: Secondary | ICD-10-CM | POA: Diagnosis not present

## 2014-11-08 DIAGNOSIS — E1169 Type 2 diabetes mellitus with other specified complication: Secondary | ICD-10-CM | POA: Diagnosis not present

## 2014-11-08 DIAGNOSIS — I10 Essential (primary) hypertension: Secondary | ICD-10-CM | POA: Diagnosis not present

## 2014-11-08 DIAGNOSIS — I509 Heart failure, unspecified: Secondary | ICD-10-CM | POA: Diagnosis not present

## 2014-11-08 DIAGNOSIS — E1140 Type 2 diabetes mellitus with diabetic neuropathy, unspecified: Secondary | ICD-10-CM | POA: Diagnosis not present

## 2014-11-08 DIAGNOSIS — I70245 Atherosclerosis of native arteries of left leg with ulceration of other part of foot: Secondary | ICD-10-CM | POA: Diagnosis not present

## 2014-11-08 DIAGNOSIS — I2510 Atherosclerotic heart disease of native coronary artery without angina pectoris: Secondary | ICD-10-CM | POA: Diagnosis not present

## 2014-11-15 ENCOUNTER — Encounter (HOSPITAL_BASED_OUTPATIENT_CLINIC_OR_DEPARTMENT_OTHER): Payer: Medicare Other | Attending: Internal Medicine

## 2014-11-15 DIAGNOSIS — I2510 Atherosclerotic heart disease of native coronary artery without angina pectoris: Secondary | ICD-10-CM | POA: Insufficient documentation

## 2014-11-15 DIAGNOSIS — E1169 Type 2 diabetes mellitus with other specified complication: Secondary | ICD-10-CM | POA: Insufficient documentation

## 2014-11-15 DIAGNOSIS — L97521 Non-pressure chronic ulcer of other part of left foot limited to breakdown of skin: Secondary | ICD-10-CM | POA: Insufficient documentation

## 2014-11-15 DIAGNOSIS — I509 Heart failure, unspecified: Secondary | ICD-10-CM | POA: Insufficient documentation

## 2014-11-15 DIAGNOSIS — I10 Essential (primary) hypertension: Secondary | ICD-10-CM | POA: Insufficient documentation

## 2014-11-15 DIAGNOSIS — E1140 Type 2 diabetes mellitus with diabetic neuropathy, unspecified: Secondary | ICD-10-CM | POA: Insufficient documentation

## 2014-11-15 DIAGNOSIS — I70245 Atherosclerosis of native arteries of left leg with ulceration of other part of foot: Secondary | ICD-10-CM | POA: Insufficient documentation

## 2014-11-19 ENCOUNTER — Ambulatory Visit: Payer: Medicare Other | Admitting: Family Medicine

## 2014-11-23 DIAGNOSIS — L97521 Non-pressure chronic ulcer of other part of left foot limited to breakdown of skin: Secondary | ICD-10-CM | POA: Diagnosis present

## 2014-11-23 DIAGNOSIS — I2510 Atherosclerotic heart disease of native coronary artery without angina pectoris: Secondary | ICD-10-CM | POA: Diagnosis not present

## 2014-11-23 DIAGNOSIS — E1169 Type 2 diabetes mellitus with other specified complication: Secondary | ICD-10-CM | POA: Diagnosis not present

## 2014-11-23 DIAGNOSIS — E1140 Type 2 diabetes mellitus with diabetic neuropathy, unspecified: Secondary | ICD-10-CM | POA: Diagnosis not present

## 2014-11-23 DIAGNOSIS — I70245 Atherosclerosis of native arteries of left leg with ulceration of other part of foot: Secondary | ICD-10-CM | POA: Diagnosis not present

## 2014-11-23 DIAGNOSIS — I10 Essential (primary) hypertension: Secondary | ICD-10-CM | POA: Diagnosis not present

## 2014-11-23 DIAGNOSIS — I509 Heart failure, unspecified: Secondary | ICD-10-CM | POA: Diagnosis not present

## 2014-12-03 DIAGNOSIS — E6609 Other obesity due to excess calories: Secondary | ICD-10-CM | POA: Diagnosis not present

## 2014-12-03 DIAGNOSIS — E1165 Type 2 diabetes mellitus with hyperglycemia: Secondary | ICD-10-CM | POA: Diagnosis not present

## 2014-12-03 DIAGNOSIS — E039 Hypothyroidism, unspecified: Secondary | ICD-10-CM | POA: Diagnosis not present

## 2014-12-03 DIAGNOSIS — I10 Essential (primary) hypertension: Secondary | ICD-10-CM | POA: Diagnosis not present

## 2014-12-03 DIAGNOSIS — E785 Hyperlipidemia, unspecified: Secondary | ICD-10-CM | POA: Diagnosis not present

## 2014-12-06 ENCOUNTER — Ambulatory Visit (HOSPITAL_COMMUNITY)
Admission: RE | Admit: 2014-12-06 | Discharge: 2014-12-06 | Disposition: A | Discharge status: Home / Self Care | Payer: Medicare Other | Source: Ambulatory Visit | Attending: Vascular Surgery | Admitting: Vascular Surgery

## 2014-12-06 DIAGNOSIS — R0989 Other specified symptoms and signs involving the circulatory and respiratory systems: Secondary | ICD-10-CM

## 2014-12-06 DIAGNOSIS — I6523 Occlusion and stenosis of bilateral carotid arteries: Secondary | ICD-10-CM | POA: Diagnosis not present

## 2014-12-13 ENCOUNTER — Ambulatory Visit (INDEPENDENT_AMBULATORY_CARE_PROVIDER_SITE_OTHER): Payer: Medicare Other | Admitting: Family Medicine

## 2014-12-13 ENCOUNTER — Encounter: Payer: Self-pay | Admitting: Family Medicine

## 2014-12-13 VITALS — BP 138/70 | Ht 67.0 in | Wt 240.0 lb

## 2014-12-13 DIAGNOSIS — G629 Polyneuropathy, unspecified: Secondary | ICD-10-CM | POA: Insufficient documentation

## 2014-12-13 DIAGNOSIS — I10 Essential (primary) hypertension: Secondary | ICD-10-CM | POA: Diagnosis not present

## 2014-12-13 DIAGNOSIS — Z23 Encounter for immunization: Secondary | ICD-10-CM | POA: Diagnosis not present

## 2014-12-13 DIAGNOSIS — I255 Ischemic cardiomyopathy: Secondary | ICD-10-CM

## 2014-12-13 DIAGNOSIS — I482 Chronic atrial fibrillation, unspecified: Secondary | ICD-10-CM

## 2014-12-13 MED ORDER — ALPRAZOLAM 1 MG PO TABS: ORAL_TABLET | ORAL | Status: DC

## 2014-12-13 MED ORDER — TRAMADOL HCL 50 MG PO TABS: 50.0000 mg | ORAL_TABLET | Freq: Every day | ORAL | Status: DC | PRN

## 2014-12-13 NOTE — Progress Notes (Signed)
   Subjective:    Patient ID: [name] [name], female    DOB: Jan 23, 1946, 69 y.o.   MRN: [id number]  Hyperlipidemia This is a chronic problem. The current episode started more than 1 year ago. There are no compliance problems (no regular exercise but pt states she states active doing housework. ).   sees Dr. Fransico Him for diabetes.   States overall diabetes in good control. Next  Uses Xanax primarily to help her sleep but also at times during the day for anxiety. States she definitely needs that it helps her considerably.  History of chronic foot ulcers and Charcot's disease of the feet. Sees a wound care specialist weekly for this.  History of chronic neuropathic pain in the feet. Numbness and tingling along with burning and substantial discomfort. Not responding very well to Tylenol   Bilateral foot and knee pain. Uses Tylenol for knee arthritis. Started years ago. Not taking any treatment. See above  Continues to see cardiologist for follow-up of her challenges in this regard no recent change in cardiac meds  Review of Systems No headache no chest pain and back pain abdominal pain no change in bowel habits    Objective:   Physical Exam  Alert vitals stable. Blood pressure good on repeat. Lungs clear. Heart regular in rhythm. Knees bilateral crepitations. Feet sensation mostly absent with dysesthesias dressing on left foot      Assessment & Plan:  Impression 1 neuropathic pain progressive not responding well to Tylenol No. 2 insomnia and anxiety discussed need for medication #3 hypertension good control. #4 diabetes discussed briefly though sees Dr. Fransico Him for that plan add Ultram 50 mg 1 daily when necessary for pain. Diet exercise discussed. Xanax refilled. Multiple questions answered WSL

## 2014-12-28 ENCOUNTER — Encounter (HOSPITAL_BASED_OUTPATIENT_CLINIC_OR_DEPARTMENT_OTHER): Payer: Medicare Other | Attending: Internal Medicine

## 2014-12-28 DIAGNOSIS — I70245 Atherosclerosis of native arteries of left leg with ulceration of other part of foot: Secondary | ICD-10-CM | POA: Insufficient documentation

## 2014-12-28 DIAGNOSIS — I509 Heart failure, unspecified: Secondary | ICD-10-CM | POA: Insufficient documentation

## 2014-12-28 DIAGNOSIS — I2510 Atherosclerotic heart disease of native coronary artery without angina pectoris: Secondary | ICD-10-CM | POA: Diagnosis not present

## 2014-12-28 DIAGNOSIS — I10 Essential (primary) hypertension: Secondary | ICD-10-CM | POA: Diagnosis not present

## 2014-12-28 DIAGNOSIS — E1169 Type 2 diabetes mellitus with other specified complication: Secondary | ICD-10-CM | POA: Diagnosis not present

## 2014-12-28 DIAGNOSIS — L97521 Non-pressure chronic ulcer of other part of left foot limited to breakdown of skin: Secondary | ICD-10-CM | POA: Diagnosis not present

## 2014-12-28 DIAGNOSIS — E1140 Type 2 diabetes mellitus with diabetic neuropathy, unspecified: Secondary | ICD-10-CM | POA: Insufficient documentation

## 2015-01-09 ENCOUNTER — Ambulatory Visit (INDEPENDENT_AMBULATORY_CARE_PROVIDER_SITE_OTHER): Payer: Medicare Other | Admitting: Cardiology

## 2015-01-09 ENCOUNTER — Encounter: Payer: Self-pay | Admitting: Cardiology

## 2015-01-09 VITALS — BP 130/74 | HR 81 | Ht 67.0 in | Wt 235.2 lb

## 2015-01-09 DIAGNOSIS — I2510 Atherosclerotic heart disease of native coronary artery without angina pectoris: Secondary | ICD-10-CM | POA: Diagnosis not present

## 2015-01-09 DIAGNOSIS — I739 Peripheral vascular disease, unspecified: Secondary | ICD-10-CM

## 2015-01-09 DIAGNOSIS — I4891 Unspecified atrial fibrillation: Secondary | ICD-10-CM | POA: Diagnosis not present

## 2015-01-09 DIAGNOSIS — I480 Paroxysmal atrial fibrillation: Secondary | ICD-10-CM

## 2015-01-09 DIAGNOSIS — I255 Ischemic cardiomyopathy: Secondary | ICD-10-CM

## 2015-01-09 DIAGNOSIS — I7025 Atherosclerosis of native arteries of other extremities with ulceration: Secondary | ICD-10-CM

## 2015-01-09 DIAGNOSIS — L98499 Non-pressure chronic ulcer of skin of other sites with unspecified severity: Secondary | ICD-10-CM

## 2015-01-09 NOTE — Patient Instructions (Signed)
Your physician wants you to follow-up in: 6 months with DR. MCDOWELL. You will receive a reminder letter in the mail two months in advance. If you don't receive a letter, please call our office to schedule the follow-up appointment.  Your physician has requested that you have an echocardiogram. Echocardiography is a painless test that uses sound waves to create images of your heart. It provides your doctor with information about the size and shape of your heart and how well your heart's chambers and valves are working. This procedure takes approximately one hour. There are no restrictions for this procedure.  KEEP APPOINTMENT WITH DR. Ladona Ridgel NEXT WEEK  Thanks for choosing [facility]!!!

## 2015-01-09 NOTE — Progress Notes (Signed)
Cardiology Office Note  Date: 01/09/2015   ID: DEBROAH SHUTTLEWORTH, DOB [birth year], MRN [id number]  PCP: Lubertha South, MD  Primary Cardiologist: Nona Dell, MD   Chief Complaint  Patient presents with  . Coronary Artery Disease  . Cardiomyopathy    History of Present Illness: [name] [name] is a 69 y.o. female last seen in January 2015. She presents for a routine follow-up visit. She tells me that she has been doing well recently, no angina symptoms or breathlessness beyond NYHA class II. She will be following up with Dr. Ladona Ridgel for device interrogation next week. She has a biventricular pacemaker and has done well with improvement in LVEF, last assessed in 2014.  Interval history includes follow-up with Dr. Darrick Penna for PAD, recent carotid Dopplers noted below and ABI was 0.48 on the left and 0.5 on the right. She has had ulceration on her left foot that has been slow to heal, but through follow-up in the wound care clinic this has stabilized just recently.  ECG today shows a ventricular paced rhythm.  Most recent lab work from July has reviewed below.   Past Medical History  Diagnosis Date  . Chronic systolic heart failure   . Diabetes mellitus, type 2   . Hyperlipidemia   . Essential hypertension, benign   . Hypothyroidism   . PVD (peripheral vascular disease)   . Coronary atherosclerosis of native coronary artery     Multivessel, patent grafts 2007  . Ischemic cardiomyopathy     Ejection Fraction of 25% improved to 45-50% following biventicular pacing, Status post biventricular dual-chamber ICD - [employer]  . Hypothyroidism   . Cardiac defibrillator in place   . Colon polyps     20 yrs ago  . Ulcer   . Myocardial infarction   . Atrial fibrillation     Past Surgical History  Procedure Laterality Date  . Coronary artery bypass graft  2001    [facility] - LIMA to LAD, SVG to ramus, SVG to OM/PLB, SVG to RCA   . Appendectomy    . Bilateral tubal  ligation    . Cardiac defibrillator placement      [employer]  . Flexible sigmoidoscopy  07/19/1998    Dr. Karilyn Cota  . Tonsillectomy    . Cesarean section    . Abdominal aortagram N/A 08/04/2013    Procedure: ABDOMINAL Ronny Flurry;  Surgeon: Sherren Kerns, MD;  Location: [facility] CATH LAB;  Service: Cardiovascular;  Laterality: N/A;  . Bi-ventricular pacemaker insertion N/A 11/20/2013    Procedure: BI-VENTRICULAR PACEMAKER INSERTION (CRT-P);  Surgeon: Marinus Maw, MD;  [facility] CATH LAB;  Service: Cardiovascular;  Laterality: N/A;    Current Outpatient Prescriptions  Medication Sig Dispense Refill  . ALPRAZolam (XANAX) 1 MG tablet Take 0.5 - 1 bid prn anxiety 60 tablet 5  . B-D INS SYR ULTRAFINE 1CC/30G 30G X 1/2" 1 ML MISC as directed.  1  . B-D ULTRAFINE III SHORT PEN 31G X 8 MM MISC as directed.  0  . carvedilol (COREG) 25 MG tablet TAKE ONE TABLET TWICE DAILY WITH A MEAL 60 tablet 11  . Cholecalciferol (VITAMIN D) 400 UNITS capsule Take 400 Units by mouth daily.    Marland Kitchen doxycycline (VIBRA-TABS) 100 MG tablet Take 100 mg by mouth 2 (two) times daily.     Marland Kitchen ELIQUIS 5 MG TABS tablet TAKE ONE TABLET TWICE DAILY 60 tablet 11  . enalapril (VASOTEC) 10 MG tablet Take 1 tablet (  10 mg total) by mouth 2 (two) times daily. 180 tablet 3  . furosemide (LASIX) 40 MG tablet TAKE ONE TABLET BY MOUTH TWICE A DAY 60 tablet 6  . insulin aspart (NOVOLOG) 100 UNIT/ML injection Inject 20-36 Units into the skin 3 (three) times daily before meals. Sliding Scale    . insulin glargine (LANTUS) 100 UNIT/ML injection Inject 100 Units into the skin daily.     Marland Kitchen levothyroxine (SYNTHROID, LEVOTHROID) 100 MCG tablet Take 100 mcg by mouth daily.      . Liraglutide (VICTOZA [place]) Inject 1.8 Units into the skin daily.     . multivitamin (THERAGRAN) per tablet Take 1 tablet by mouth daily.      . Omega-3 Fatty Acids (FISH OIL) 1000 MG CAPS Take 1,000 mg by mouth daily.    Marland Kitchen OVER THE COUNTER MEDICATION Take 1  tablet by mouth daily. Phyomega vitamin.    . potassium chloride SA (K-DUR,KLOR-CON) 20 MEQ tablet TAKE ONE TABLET BY MOUTH TWICE A DAY 60 tablet 6  . traMADol (ULTRAM) 50 MG tablet Take 1 tablet (50 mg total) by mouth daily as needed for moderate pain. 30 tablet 5  . vitamin E 600 UNIT capsule Take 600 Units by mouth daily.       No current facility-administered medications for this visit.    Allergies:  Avandia; Celebrex; and Tape   Social History: The patient  reports that she quit smoking about 23 years ago. She has never used smokeless tobacco. She reports that she does not drink alcohol or use illicit drugs.   ROS:  Please see the history of present illness. Otherwise, complete review of systems is positive for improved left foot ulcer, no significant leg edema, no orthopnea or PND.  All other systems are reviewed and negative.   Physical Exam: VS:  BP 130/74 mmHg  Pulse 81  Ht  (1.702 m)  Wt 235 lb 3.2 oz (106.686 kg)  BMI 36.83 kg/m2  SpO2 95%, BMI Body mass index is 36.83 kg/(m^2).  Wt Readings from Last 3 Encounters:  01/09/15 235 lb 3.2 oz (106.686 kg)  12/13/14 240 lb (108.863 kg)  07/19/14 238 lb (107.956 kg)     Obese woman in NAD.  HEENT: Conjunctiva and lids normal, oropharynx clear.  Neck: Supple, increased girth, no carotid bruits, no thyromegaly.  Lungs: Clear to auscultation, nonlabored breathing at rest.  Cardiac: Regular rate and rhythm, no S3, no pericardial rub.  Abdomen: Soft, nontender, bowel sounds present, no guarding or rebound.  Extremities: Trace leg edema, distal pulses 1-2+. Orthopedic support shoe on left. Skin: Warm and dry.  Musculoskeletal: No kyphosis.  Neuropsychiatric: Alert and oriented x3, affect grossly appropriate.   ECG: ECG is ordered today.  Recent Labwork: 11/09/2014: Cholesterol 170, HDL 30, triglycerides 202, LDL 100, BUN 14, creatinine 0.6, potassium 4.7, AST 21, ALT 18, hemoglobin A1c 7.6  Other Studies  Reviewed Today:  Carotid Dopplers from 12/06/2014 showed 60-79% RICA stenosis and 40-59% LICA stenosis.  Echocardiogram 01/11/2013: Study Conclusions  - Left ventricle: The cavity size was severely dilated. Wall thickness was normal. Systolic function was mildly to moderately reduced. The estimated ejection fraction was in the range of 40% to 45%. Indeterminate diastolicfunction.There is evidence of elevated left atrial pressure. - Aortic valve: Moderately calcified annulus. Moderately thickened leaflets, uncertain number of leaflets. Valve area: 1.61cm^2(VTI). Valve area: 1.59cm^2 (Vmax). - Mitral valve: Mildly calcified annulus. Mildly thickened leaflets . Mild regurgitation. - Left atrium: The atrium was severely dilated. -  Right ventricle: The cavity size was moderately dilated. Systolic function was moderately reduced, RV TAPSE is 1.2 cm. - Right atrium: The atrium was mildly dilated.   ASSESSMENT AND PLAN:  1. Symptomatically stable CAD status post CABG in 2001, with patent bypass grafts as of 2007. She continues on medical therapy, has been increasing activity since her left leg ulcer has improved. We will continue observation.  2. Ischemic cardiomyopathy with LVEF 40-45% as of 2014. Follow-up echocardiogram will be obtained.  3. Paroxysmal atrial fibrillation, well-controlled on device interrogation. She continues on Elavil was for stroke prophylaxis.  4. Biventricular pacemaker in place, followed by Dr. Ladona Ridgel with device interrogation pending for next week.  5. PAD and carotid artery disease, followed by Dr. Darrick Penna.  Current medicines were reviewed at length with the patient today.   Orders Placed This Encounter  Procedures  . EKG 12-Lead    Disposition: FU with me in 6 months.   Signed, Jonelle Sidle, MD, [facility] 01/09/2015 12:05 PM    [facility] at [facility] [address] [place], [place] [address] Phone:  [phone]; Fax: [phone]

## 2015-01-10 ENCOUNTER — Other Ambulatory Visit: Payer: Self-pay | Admitting: "Endocrinology

## 2015-01-11 ENCOUNTER — Ambulatory Visit (HOSPITAL_COMMUNITY): Payer: Medicare Other

## 2015-01-14 ENCOUNTER — Encounter: Payer: Self-pay | Admitting: Internal Medicine

## 2015-01-14 ENCOUNTER — Ambulatory Visit (INDEPENDENT_AMBULATORY_CARE_PROVIDER_SITE_OTHER): Payer: Medicare Other | Admitting: Internal Medicine

## 2015-01-14 VITALS — BP 130/74 | HR 77 | Ht 67.0 in | Wt 238.2 lb

## 2015-01-14 DIAGNOSIS — Z950 Presence of cardiac pacemaker: Secondary | ICD-10-CM

## 2015-01-14 DIAGNOSIS — I255 Ischemic cardiomyopathy: Secondary | ICD-10-CM | POA: Diagnosis not present

## 2015-01-14 DIAGNOSIS — I10 Essential (primary) hypertension: Secondary | ICD-10-CM | POA: Diagnosis not present

## 2015-01-14 DIAGNOSIS — I482 Chronic atrial fibrillation: Secondary | ICD-10-CM

## 2015-01-14 DIAGNOSIS — I481 Persistent atrial fibrillation: Secondary | ICD-10-CM

## 2015-01-14 DIAGNOSIS — I4819 Other persistent atrial fibrillation: Secondary | ICD-10-CM

## 2015-01-14 NOTE — Assessment & Plan Note (Signed)
Her ventricular rate is well controlled. She will continue her current meds. 

## 2015-01-14 NOTE — Assessment & Plan Note (Signed)
Her blood pressure is well controlled. She will continue her current meds.  

## 2015-01-14 NOTE — Assessment & Plan Note (Signed)
She has lost some weight. I have encouraged her to continue to lose weight.

## 2015-01-14 NOTE — Assessment & Plan Note (Signed)
Her [place] BiV PM is programmed VVIR. We will recheck in several months.

## 2015-01-14 NOTE — Progress Notes (Signed)
HPI [name] [name] returns today for followup. She is a pleasant 69 yo woman with a h/o ICM, chronic systolic CHF, chronic atrial fib, LBBB, and now high grade heart block,  s/p ICD implant, now BiV PPM. In the interim, She feels well. She denies chest pain, sob, or syncope. No palpitations. She has had a nice improvement in her LV function to 45%. She has undergone BiV device change out going from an ICD to a PPM. She initially had some incisional soreness but this has resolved. She notes some arthritic changes in her hands. Allergies  Allergen Reactions  . Avandia [Rosiglitazone] Other (See Comments)    CHF  . Celebrex [Celecoxib] Other (See Comments)    Congestive heart failure  . Tape     Left rash per patient     Current Outpatient Prescriptions  Medication Sig Dispense Refill  . ALPRAZolam (XANAX) 1 MG tablet Take 0.5 - 1 bid prn anxiety 60 tablet 5  . B-D INS SYR ULTRAFINE 1CC/30G 30G X 1/2" 1 ML MISC as directed.  1  . B-D ULTRAFINE III SHORT PEN 31G X 8 MM MISC USE 1 DAILY AT BEDTIME. 100 each 3  . carvedilol (COREG) 25 MG tablet TAKE ONE TABLET TWICE DAILY WITH A MEAL 60 tablet 11  . Cholecalciferol (VITAMIN D) 400 UNITS capsule Take 400 Units by mouth daily.    Marland Kitchen doxycycline (VIBRA-TABS) 100 MG tablet Take 100 mg by mouth 2 (two) times daily.     Marland Kitchen ELIQUIS 5 MG TABS tablet TAKE ONE TABLET TWICE DAILY 60 tablet 11  . enalapril (VASOTEC) 10 MG tablet Take 1 tablet (10 mg total) by mouth 2 (two) times daily. 180 tablet 3  . furosemide (LASIX) 40 MG tablet TAKE ONE TABLET BY MOUTH TWICE A DAY 60 tablet 6  . insulin aspart (NOVOLOG) 100 UNIT/ML injection Inject 20-36 Units into the skin 3 (three) times daily before meals. Sliding Scale    . insulin glargine (LANTUS) 100 UNIT/ML injection Inject 100 Units into the skin daily.     Marland Kitchen levothyroxine (SYNTHROID, LEVOTHROID) 100 MCG tablet Take 100 mcg by mouth daily.      . Liraglutide (VICTOZA Parker) Inject 1.8 Units into the skin daily.      . multivitamin (THERAGRAN) per tablet Take 1 tablet by mouth daily.      . Omega-3 Fatty Acids (FISH OIL) 1000 MG CAPS Take 1,000 mg by mouth daily.    Marland Kitchen OVER THE COUNTER MEDICATION Take 1 tablet by mouth daily. Phyomega vitamin.    . potassium chloride SA (K-DUR,KLOR-CON) 20 MEQ tablet TAKE ONE TABLET BY MOUTH TWICE A DAY 60 tablet 6  . traMADol (ULTRAM) 50 MG tablet Take 1 tablet (50 mg total) by mouth daily as needed for moderate pain. 30 tablet 5  . vitamin E 600 UNIT capsule Take 600 Units by mouth daily.       No current facility-administered medications for this visit.     Past Medical History  Diagnosis Date  . Chronic systolic heart failure (HCC)   . Diabetes mellitus, type 2 (HCC)   . Hyperlipidemia   . Essential hypertension, benign   . Hypothyroidism   . PVD (peripheral vascular disease) (HCC)   . Coronary atherosclerosis of native coronary artery     Multivessel, patent grafts 2007  . Ischemic cardiomyopathy     Ejection Fraction of 25% improved to 45-50% following biventicular pacing, Status post biventricular dual-chamber ICD - [employer]  .  Hypothyroidism   . Cardiac defibrillator in place   . Colon polyps     20 yrs ago  . Ulcer   . Myocardial infarction (HCC)   . Atrial fibrillation (HCC)     ROS:   All systems reviewed and negative except as noted in the HPI.   Past Surgical History  Procedure Laterality Date  . Coronary artery bypass graft  2001    [facility] - LIMA to LAD, SVG to ramus, SVG to OM/PLB, SVG to RCA   . Appendectomy    . Bilateral tubal ligation    . Cardiac defibrillator placement      [employer]  . Flexible sigmoidoscopy  07/19/1998    Dr. Karilyn Cota  . Tonsillectomy    . Cesarean section    . Abdominal aortagram N/A 08/04/2013    Procedure: ABDOMINAL Ronny Flurry;  Surgeon: Sherren Kerns, MD;  Location: [facility] CATH LAB;  Service: Cardiovascular;  Laterality: N/A;  . Bi-ventricular pacemaker insertion N/A  11/20/2013    Procedure: BI-VENTRICULAR PACEMAKER INSERTION (CRT-P);  Surgeon: Marinus Maw, MD;  Location: [facility] CATH LAB;  Service: Cardiovascular;  Laterality: N/A;     Family History  Problem Relation Age of Onset  . Diabetes Mother   . Ulcers Mother   . Heart disease Mother     before age 39  . Hyperlipidemia Mother   . Hypertension Mother   . Diabetes Sister   . Heart disease Sister     before age 1  . Heart attack Sister   . Diabetes Father   . Heart attack Father   . Hyperlipidemia Father   . Hypertension Father   . Heart disease Father     before age 65  . AAA (abdominal aortic aneurysm) Father   . Coronary artery disease Sister 28     Social History   Social History  . Marital Status: Divorced    Spouse Name: N/A  . Number of Children: 1  . Years of Education: N/A   Occupational History  . Full Time: [employer]   . Disabled   .     Social History Main Topics  . Smoking status: Former Smoker    Quit date: 09/16/1991  . Smokeless tobacco: Never Used  . Alcohol Use: No  . Drug Use: No  . Sexual Activity: No   Other Topics Concern  . Not on file   Social History Narrative     BP - 132/76, P - 69, weight 255 lbs  Physical Exam:  Obese appearing 69 yo woman NAD HEENT: Unremarkable Neck:  No JVD, no thyromegally, I do not appreciate a gouter Back:  No CVA tenderness Lungs:  Clear with no wheezes. HEART:  Regular rate rhythm, no murmurs, no rubs, no clicks Abd:  Soft,obese,  positive bowel sounds, no organomegally, no rebound, no guarding Ext:  2 plus pulses, no edema, no cyanosis, no clubbing Skin:  No rashes no nodules Neuro:  CN II through XII intact, motor grossly intact   DEVICE  Normal device function.  See PaceArt for details.  Assess/Plan:

## 2015-01-14 NOTE — Patient Instructions (Signed)
Your physician wants you to follow-up in: 1 Year with Dr. Taylor. You will receive a reminder letter in the mail two months in advance. If you don't receive a letter, please call our office to schedule the follow-up appointment.  Remote monitoring is used to monitor your Pacemaker of ICD from home. This monitoring reduces the number of office visits required to check your device to one time per year. It allows us to keep an eye on the functioning of your device to ensure it is working properly. You are scheduled for a device check from home on 04/16/15. You may send your transmission at any time that day. If you have a wireless device, the transmission will be sent automatically. After your physician reviews your transmission, you will receive a postcard with your next transmission date.  Your physician recommends that you continue on your current medications as directed. Please refer to the Current Medication list given to you today.  Thank you for choosing [facility]!   

## 2015-01-15 ENCOUNTER — Ambulatory Visit (HOSPITAL_COMMUNITY): Payer: Medicare Other | Attending: Cardiology

## 2015-01-18 ENCOUNTER — Other Ambulatory Visit: Payer: Self-pay | Admitting: "Endocrinology

## 2015-01-21 LAB — CUP PACEART INCLINIC DEVICE CHECK
Battery Remaining Longevity: 10
Brady Statistic RV Percent Paced: 92 %
Date Time Interrogation Session: 20161010164246
Lead Channel Impedance Value: 628 Ohm
Lead Channel Impedance Value: 636 Ohm
Lead Channel Pacing Threshold Amplitude: 0.7 V
Lead Channel Pacing Threshold Pulse Width: 0.4 ms
Lead Channel Setting Pacing Pulse Width: 0.5 ms
MDC IDC MSMT LEADCHNL LV IMPEDANCE VALUE: 793 Ohm
MDC IDC MSMT LEADCHNL LV PACING THRESHOLD AMPLITUDE: 0.8 V
MDC IDC MSMT LEADCHNL LV PACING THRESHOLD PULSEWIDTH: 0.5 ms
MDC IDC SET LEADCHNL LV PACING AMPLITUDE: 1.5 V
MDC IDC SET LEADCHNL LV SENSING SENSITIVITY: 2.5 mV
MDC IDC SET LEADCHNL RV PACING AMPLITUDE: 2.4 V
MDC IDC SET LEADCHNL RV PACING PULSEWIDTH: 0.4 ms
MDC IDC SET LEADCHNL RV SENSING SENSITIVITY: 2.5 mV
MDC IDC STAT BRADY RA PERCENT PACED: 0 %
Pulse Gen Serial Number: 118262
Zone Setting Detection Interval: 375 ms

## 2015-02-14 ENCOUNTER — Other Ambulatory Visit: Payer: Self-pay | Admitting: "Endocrinology

## 2015-02-26 ENCOUNTER — Encounter: Payer: Medicare Other | Admitting: Nurse Practitioner

## 2015-03-06 ENCOUNTER — Ambulatory Visit: Payer: Medicare Other | Admitting: "Endocrinology

## 2015-03-14 ENCOUNTER — Other Ambulatory Visit: Payer: Self-pay | Admitting: Internal Medicine

## 2015-04-12 DIAGNOSIS — H35372 Puckering of macula, left eye: Secondary | ICD-10-CM | POA: Diagnosis not present

## 2015-04-12 DIAGNOSIS — E113553 Type 2 diabetes mellitus with stable proliferative diabetic retinopathy, bilateral: Secondary | ICD-10-CM | POA: Diagnosis not present

## 2015-04-12 LAB — HM DIABETES EYE EXAM

## 2015-04-16 ENCOUNTER — Ambulatory Visit (INDEPENDENT_AMBULATORY_CARE_PROVIDER_SITE_OTHER): Payer: Medicare Other | Admitting: *Deleted

## 2015-04-16 DIAGNOSIS — I255 Ischemic cardiomyopathy: Secondary | ICD-10-CM

## 2015-04-16 NOTE — Progress Notes (Signed)
Remote pacemaker transmission.   

## 2015-04-24 DIAGNOSIS — E1165 Type 2 diabetes mellitus with hyperglycemia: Secondary | ICD-10-CM | POA: Diagnosis not present

## 2015-04-24 DIAGNOSIS — I10 Essential (primary) hypertension: Secondary | ICD-10-CM | POA: Diagnosis not present

## 2015-04-24 DIAGNOSIS — E782 Mixed hyperlipidemia: Secondary | ICD-10-CM | POA: Diagnosis not present

## 2015-04-24 DIAGNOSIS — E038 Other specified hypothyroidism: Secondary | ICD-10-CM | POA: Diagnosis not present

## 2015-04-26 LAB — CUP PACEART REMOTE DEVICE CHECK
Battery Remaining Percentage: 100 %
Brady Statistic RA Percent Paced: 0 %
Brady Statistic RV Percent Paced: 97 %
Date Time Interrogation Session: 20170103092200
Implantable Lead Implant Date: 20090202
Implantable Lead Implant Date: 20090202
Implantable Lead Implant Date: 20090202
Implantable Lead Location: 753858
Implantable Lead Location: 753859
Implantable Lead Location: 753860
Implantable Lead Model: 4554
Implantable Lead Serial Number: 28377340
Lead Channel Impedance Value: 740 Ohm
Lead Channel Pacing Threshold Amplitude: 0.8 V
Lead Channel Pacing Threshold Pulse Width: 0.5 ms
Lead Channel Setting Pacing Amplitude: 1.5 V
Lead Channel Setting Pacing Pulse Width: 0.4 ms
Lead Channel Setting Pacing Pulse Width: 0.5 ms
Lead Channel Setting Sensing Sensitivity: 2.5 mV
MDC IDC LEAD MODEL: 157
MDC IDC LEAD MODEL: 4135
MDC IDC LEAD SERIAL: 138236
MDC IDC LEAD SERIAL: 158281
MDC IDC MSMT BATTERY REMAINING LONGEVITY: 120 mo
MDC IDC MSMT LEADCHNL RA IMPEDANCE VALUE: 620 Ohm
MDC IDC MSMT LEADCHNL RV IMPEDANCE VALUE: 644 Ohm
MDC IDC SET LEADCHNL LV SENSING SENSITIVITY: 2.5 mV
MDC IDC SET LEADCHNL RV PACING AMPLITUDE: 2.4 V
Pulse Gen Serial Number: 118262

## 2015-04-29 DIAGNOSIS — R432 Parageusia: Secondary | ICD-10-CM | POA: Diagnosis not present

## 2015-05-01 ENCOUNTER — Encounter: Payer: Self-pay | Admitting: Cardiology

## 2015-05-06 ENCOUNTER — Encounter: Payer: Self-pay | Admitting: "Endocrinology

## 2015-05-06 ENCOUNTER — Ambulatory Visit (INDEPENDENT_AMBULATORY_CARE_PROVIDER_SITE_OTHER): Payer: Medicare Other | Admitting: "Endocrinology

## 2015-05-06 VITALS — BP 174/85 | HR 69 | Ht 67.0 in | Wt 242.0 lb

## 2015-05-06 DIAGNOSIS — E038 Other specified hypothyroidism: Secondary | ICD-10-CM

## 2015-05-06 DIAGNOSIS — I10 Essential (primary) hypertension: Secondary | ICD-10-CM

## 2015-05-06 DIAGNOSIS — E785 Hyperlipidemia, unspecified: Secondary | ICD-10-CM

## 2015-05-06 DIAGNOSIS — E1159 Type 2 diabetes mellitus with other circulatory complications: Secondary | ICD-10-CM

## 2015-05-06 DIAGNOSIS — I255 Ischemic cardiomyopathy: Secondary | ICD-10-CM | POA: Diagnosis not present

## 2015-05-06 MED ORDER — LIRAGLUTIDE 18 MG/3ML ~~LOC~~ SOPN: 1.8000 mg | PEN_INJECTOR | Freq: Once | SUBCUTANEOUS | Status: DC

## 2015-05-06 MED ORDER — GLUCOSE BLOOD VI STRP: ORAL_STRIP | Status: DC

## 2015-05-06 MED ORDER — "INSULIN SYRINGE-NEEDLE U-100 30G X 1/2"" 1 ML MISC": Status: DC

## 2015-05-06 MED ORDER — ACCU-CHEK AVIVA DEVI: Status: AC

## 2015-05-06 MED ORDER — INSULIN ASPART 100 UNIT/ML ~~LOC~~ SOLN: 20.0000 [IU] | Freq: Three times a day (TID) | SUBCUTANEOUS | Status: DC

## 2015-05-06 MED ORDER — INSULIN GLARGINE 100 UNIT/ML ~~LOC~~ SOLN: 80.0000 [IU] | Freq: Every day | SUBCUTANEOUS | Status: DC

## 2015-05-06 MED ORDER — LEVOTHYROXINE SODIUM 125 MCG PO TABS: 125.0000 ug | ORAL_TABLET | Freq: Every day | ORAL | Status: DC

## 2015-05-06 NOTE — Patient Instructions (Signed)

## 2015-05-06 NOTE — Progress Notes (Signed)
Subjective:    Patient ID: [name] [name], female    DOB: Oct 26, 1945, PCP Lubertha South, MD   Past Medical History  Diagnosis Date  . Chronic systolic heart failure (HCC)   . Diabetes mellitus, type 2 (HCC)   . Hyperlipidemia   . Essential hypertension, benign   . Hypothyroidism   . PVD (peripheral vascular disease) (HCC)   . Coronary atherosclerosis of native coronary artery     Multivessel, patent grafts 2007  . Ischemic cardiomyopathy     Ejection Fraction of 25% improved to 45-50% following biventicular pacing, Status post biventricular dual-chamber ICD - [employer]  . Hypothyroidism   . Cardiac defibrillator in place   . Colon polyps     20 yrs ago  . Ulcer   . Myocardial infarction (HCC)   . Atrial fibrillation [facility])    Past Surgical History  Procedure Laterality Date  . Coronary artery bypass graft  2001    [facility] - LIMA to LAD, SVG to ramus, SVG to OM/PLB, SVG to RCA   . Appendectomy    . Bilateral tubal ligation    . Cardiac defibrillator placement      [employer]  . Flexible sigmoidoscopy  07/19/1998    Dr. Karilyn Cota  . Tonsillectomy    . Cesarean section    . Abdominal aortagram N/A 08/04/2013    Procedure: ABDOMINAL Ronny Flurry;  Surgeon: Sherren Kerns, MD;  Location: [facility] CATH LAB;  Service: Cardiovascular;  Laterality: N/A;  . Bi-ventricular pacemaker insertion N/A 11/20/2013    Procedure: BI-VENTRICULAR PACEMAKER INSERTION (CRT-P);  Surgeon: Marinus Maw, MD;  Location: [facility] CATH LAB;  Service: Cardiovascular;  Laterality: N/A;   Social History   Social History  . Marital Status: Divorced    Spouse Name: N/A  . Number of Children: 1  . Years of Education: N/A   Occupational History  . Full Time: [employer]   . Disabled   .     Social History Main Topics  . Smoking status: Former Smoker    Quit date: 09/16/1991  . Smokeless tobacco: Never Used  . Alcohol Use: No  . Drug Use: No  . Sexual Activity:  No   Other Topics Concern  . None   Social History Narrative   Outpatient Encounter Prescriptions as of 05/06/2015  Medication Sig  . insulin aspart (NOVOLOG) 100 UNIT/ML injection Inject 20-26 Units into the skin 3 (three) times daily with meals.  . insulin glargine (LANTUS) 100 UNIT/ML injection Inject 0.8 mLs (80 Units total) into the skin at bedtime.  . Liraglutide (VICTOZA) 18 MG/3ML SOPN Inject 0.3 mLs (1.8 mg total) into the skin once.  . [DISCONTINUED] insulin glargine (LANTUS) 100 UNIT/ML injection Inject 80 Units into the skin at bedtime.   . [DISCONTINUED] NOVOLOG 100 UNIT/ML injection INJECT 30 UNITS S.Q. THREE TIMES DAILY (Patient taking differently: INJECT 20-26 UNITS S.Q. THREE TIMES DAILY)  . [DISCONTINUED] VICTOZA 18 MG/3ML SOPN INJECT 1.8 MG SUBCUTANEOUSLY ONCE DAILY  . ALPRAZolam (XANAX) 1 MG tablet Take 0.5 - 1 bid prn anxiety  . B-D ULTRAFINE III SHORT PEN 31G X 8 MM MISC USE 1 DAILY AT BEDTIME.  Marland Kitchen Blood Glucose Monitoring Suppl (ACCU-CHEK AVIVA) device Use as instructed  . carvedilol (COREG) 25 MG tablet TAKE ONE TABLET TWICE DAILY WITH A MEAL  . Cholecalciferol (VITAMIN D) 400 UNITS capsule Take 400 Units by mouth daily.  Marland Kitchen ELIQUIS 5 MG TABS tablet TAKE ONE TABLET  TWICE DAILY  . enalapril (VASOTEC) 10 MG tablet Take 1 tablet (10 mg total) by mouth 2 (two) times daily.  . furosemide (LASIX) 40 MG tablet TAKE ONE TABLET TWICE DAILY  . glucose blood (ACCU-CHEK AVIVA) test strip Use to test glucose 4 times a day  . Insulin Syringe-Needle U-100 (B-D INS SYR ULTRAFINE 1CC/30G) 30G X 1/2" 1 ML MISC Use to inject insulin 4 times a day  . levothyroxine (SYNTHROID, LEVOTHROID) 125 MCG tablet Take 1 tablet (125 mcg total) by mouth daily.  . multivitamin (THERAGRAN) per tablet Take 1 tablet by mouth daily.    . Omega-3 Fatty Acids (FISH OIL) 1000 MG CAPS Take 1,000 mg by mouth daily.  Marland Kitchen OVER THE COUNTER MEDICATION Take 1 tablet by mouth daily. Phyomega vitamin.  . potassium  chloride SA (K-DUR,KLOR-CON) 20 MEQ tablet TAKE ONE TABLET BY MOUTH TWICE A DAY  . traMADol (ULTRAM) 50 MG tablet Take 1 tablet (50 mg total) by mouth daily as needed for moderate pain.  . vitamin E 600 UNIT capsule Take 600 Units by mouth daily.    . [DISCONTINUED] B-D INS SYR ULTRAFINE 1CC/30G 30G X 1/2" 1 ML MISC as directed.  . [DISCONTINUED] doxycycline (VIBRA-TABS) 100 MG tablet Take 100 mg by mouth 2 (two) times daily.   . [DISCONTINUED] levothyroxine (SYNTHROID, LEVOTHROID) 100 MCG tablet Take 100 mcg by mouth daily.    . [DISCONTINUED] Liraglutide (VICTOZA Tensas) Inject 1.8 Units into the skin daily.    No facility-administered encounter medications on file as of 05/06/2015.   ALLERGIES: Allergies  Allergen Reactions  . Avandia [Rosiglitazone] Other (See Comments)    CHF  . Celebrex [Celecoxib] Other (See Comments)    Congestive heart failure  . Tape     Left rash per patient   VACCINATION STATUS: Immunization History  Administered Date(s) Administered  . Influenza,inj,Quad PF,36+ Mos 01/10/2013, 01/01/2014, 12/13/2014  . Influenza-Unspecified 01/11/2012  . Pneumococcal Conjugate-13 11/13/2013  . Pneumococcal Polysaccharide-23 08/02/2012  . Zoster 11/25/2009    Diabetes She presents for her follow-up diabetic visit. She has type 2 diabetes mellitus. Onset time: She was diagnosed at approximate age of 34 years. Her disease course has been stable. There are no hypoglycemic associated symptoms. Pertinent negatives for hypoglycemia include no confusion, headaches, pallor or seizures. There are no diabetic associated symptoms. Pertinent negatives for diabetes include no chest pain, no polydipsia, no polyphagia and no polyuria. There are no hypoglycemic complications. Symptoms are stable. Diabetic complications include heart disease. Risk factors for coronary artery disease include diabetes mellitus, dyslipidemia, hypertension, obesity, sedentary lifestyle and tobacco exposure. She is  compliant with treatment most of the time. Her weight is stable. She is following a generally unhealthy diet. She has had a previous visit with a dietitian. Her breakfast blood glucose range is generally 140-180 mg/dl. Her lunch blood glucose range is generally 140-180 mg/dl. Her dinner blood glucose range is generally 140-180 mg/dl. Her overall blood glucose range is 140-180 mg/dl. An ACE inhibitor/angiotensin II receptor blocker is being taken.  Hyperlipidemia This is a chronic problem. The current episode started more than 1 year ago. Pertinent negatives include no chest pain, myalgias or shortness of breath. Current antihyperlipidemic treatment includes statins. Risk factors for coronary artery disease include dyslipidemia, diabetes mellitus, hypertension and a sedentary lifestyle.  Hypertension This is a chronic problem. The current episode started more than 1 year ago. Pertinent negatives include no chest pain, headaches, palpitations or shortness of breath. Past treatments include ACE inhibitors. Compliance problems  include diet.  Hypertensive end-organ damage includes CAD/MI and a thyroid problem.  Thyroid Problem Presents for follow-up visit. Patient reports no cold intolerance, diarrhea, heat intolerance or palpitations. The symptoms have been improving. Past treatments include levothyroxine. Her past medical history is significant for hyperlipidemia.     Review of Systems  Constitutional: Negative for fever, chills and unexpected weight change.  HENT: Negative for trouble swallowing and voice change.   Eyes: Negative for visual disturbance.  Respiratory: Negative for cough, shortness of breath and wheezing.   Cardiovascular: Negative for chest pain, palpitations and leg swelling.  Gastrointestinal: Negative for nausea, vomiting and diarrhea.  Endocrine: Negative for cold intolerance, heat intolerance, polydipsia, polyphagia and polyuria.  Musculoskeletal: Negative for myalgias and  arthralgias.  Skin: Negative for color change, pallor, rash and wound.  Neurological: Negative for seizures and headaches.  Psychiatric/Behavioral: Negative for suicidal ideas and confusion.    Objective:    BP 174/85 mmHg  Pulse 69  Ht 5\' 7"  (1.702 m)  Wt 242 lb (109.77 kg)  BMI 37.89 kg/m2  SpO2 99%  Wt Readings from Last 3 Encounters:  05/06/15 242 lb (109.77 kg)  01/14/15 238 lb 3.2 oz (108.047 kg)  01/09/15 235 lb 3.2 oz (106.686 kg)    Physical Exam  Constitutional: She is oriented to person, place, and time. She appears well-developed.  HENT:  Head: Normocephalic and atraumatic.  Eyes: EOM are normal.  Neck: Normal range of motion. Neck supple. No tracheal deviation present. No thyromegaly present.  Cardiovascular: Normal rate and regular rhythm.   Pulmonary/Chest: Effort normal and breath sounds normal.  Abdominal: Soft. Bowel sounds are normal. There is no tenderness. There is no guarding.  Musculoskeletal: Normal range of motion. She exhibits no edema.  Neurological: She is alert and oriented to person, place, and time. She has normal reflexes. No cranial nerve deficit. Coordination normal.  Skin: Skin is warm and dry. No rash noted. No erythema. No pallor.  Psychiatric: She has a normal mood and affect. Judgment normal.     CMP ( most recent) CMP     Component Value Date/Time   NA 140 11/20/2013 0827   K 4.7 11/20/2013 0827   CL 101 11/20/2013 0827   CO2 26 11/20/2013 0827   GLUCOSE 247* 11/20/2013 0827   BUN 19 11/20/2013 0827   CREATININE 0.73 11/20/2013 0827   CREATININE 1.09 03/22/2013 1455   CALCIUM 9.3 11/20/2013 0827   PROT 6.8 02/26/2012 0521   ALBUMIN 3.7 02/26/2012 0521   AST 31 02/26/2012 0521   ALT 35 02/26/2012 0521   ALKPHOS 45 02/26/2012 0521   BILITOT 0.5 02/26/2012 0521   GFRNONAA 87* 11/20/2013 0827   GFRNONAA 63 01/10/2013 1659   GFRAA >90 11/20/2013 0827   GFRAA 73 01/10/2013 1659     Diabetic Labs (most recent): Lab Results   Component Value Date   HGBA1C 7.5* 06/24/2009   HGBA1C 8.8* 05/14/2008     Lipid Panel ( most recent) Lipid Panel     Component Value Date/Time   CHOL 142 07/31/2008 1508   TRIG 379.0* 07/31/2008 1508   HDL 31.60* 07/31/2008 1508   CHOLHDL 4 07/31/2008 1508   VLDL 75.8* 07/31/2008 1508   LDLCALC 68 05/14/2008 1131   LDLDIRECT 60.5 07/31/2008 1508     Assessment & Plan:   1. Type 2 diabetes mellitus with vascular disease (HCC) -Her diabetes is  complicated by coronary artery disease and patient remains at a high risk for more acute and  chronic complications of diabetes which include CAD, CVA, CKD, retinopathy, and neuropathy. These are all discussed in detail with the patient.  Patient came with improved glucose profile, and  recent A1c of 7.5 %.  Glucose logs and insulin administration records pertaining to this visit,  to be scanned into patient's records.  Recent labs reviewed.   - I have re-counseled the patient on diet management and weight loss  by adopting a carbohydrate restricted / protein rich  Diet.  - Suggestion is made for patient to avoid simple carbohydrates   from their diet including Cakes , Desserts, Ice Cream,  Soda (  diet and regular) , Sweet Tea , Candies,  Chips, Cookies, Artificial Sweeteners,   and "Sugar-free" Products .  This will help patient to have stable blood glucose profile and potentially avoid unintended  Weight gain.  - Patient is advised to stick to a routine mealtimes to eat 3 meals  a day and avoid unnecessary snacks ( to snack only to correct hypoglycemia).  - The patient  has been  scheduled with Norm Salt, RDN, CDE for individualized DM education.  - I have approached patient with the following individualized plan to manage diabetes and patient agrees.  - I will proceed with basal insulin Lantus 80 units QHS, and prandial insulin NovoLog 20 units TIDAC for pre-meal BG readings of 90-150mg /dl, plus patient specific correction dose of  rapid acting insulin  for unexpected hyperglycemia above 150mg /dl, associated with strict monitoring of glucose  AC and HS. - Patient is warned not to take insulin without proper monitoring per orders. -Adjustment parameters are given for hypo and hyperglycemia in writing. -Patient is encouraged to call clinic for blood glucose levels less than 70 or above 300 mg /dl. - I will continue Victoza 1.8 mg subcutaneously daily, therapeutically suitable for patient.  - Patient specific target  for A1c; LDL, HDL, Triglycerides, and  Waist Circumference were discussed in detail.  2) BP/HTN: Controlled. Continue current medications including ACEI/ARB. 3) Lipids/HPL:  continue statins. 4)  Weight/Diet: CDE consult in progress, exercise, and carbohydrates information provided. 5) hypothyroidism: She would benefit from slight increase in her levothyroxine. I will increase to 125 g by mouth every morning.  - We discussed about correct intake of levothyroxine, at fasting, with water, separated by at least 30 minutes from breakfast, and separated by more than 4 hours from calcium, iron, multivitamins, acid reflux medications (PPIs). -Patient is made aware of the fact that thyroid hormone replacement is needed for life, dose to be adjusted by periodic monitoring of thyroid function tests.  6) Chronic Care/Health Maintenance:  -Patient is on ACEI/ARB and Statin medications and encouraged to continue to follow up with Ophthalmology, Podiatrist at least yearly or according to recommendations, and advised to  stay away from smoking. I have recommended yearly flu vaccine and pneumonia vaccination at least every 5 years; moderate intensity exercise for up to 150 minutes weekly; and  sleep for at least 7 hours a day.  - 25 minutes of time was spent on the care of this patient , 50% of which was applied for counseling on diabetes complications and their preventions.  - I advised patient to maintain close follow up with  Lubertha South, MD for primary care needs.  Patient is asked to bring meter and  blood glucose logs during their next visit.   Follow up plan: -Return in about 3 months (around 08/04/2015) for diabetes, high blood pressure, high cholesterol, underactive thyroid, follow up with  pre-visit labs, meter, and logs.  Marquis Lunch, MD Phone: [phone]  Fax: [phone]   05/06/2015, 4:35 PM

## 2015-05-13 ENCOUNTER — Other Ambulatory Visit: Payer: Self-pay | Admitting: Internal Medicine

## 2015-06-11 ENCOUNTER — Other Ambulatory Visit: Payer: Self-pay | Admitting: Family Medicine

## 2015-06-11 ENCOUNTER — Other Ambulatory Visit: Payer: Self-pay | Admitting: Internal Medicine

## 2015-06-11 NOTE — Telephone Encounter (Signed)
30 d worth 

## 2015-06-11 NOTE — Telephone Encounter (Signed)
30 d oik, six mo visit due

## 2015-06-12 ENCOUNTER — Ambulatory Visit: Payer: Medicare Other | Admitting: Family Medicine

## 2015-06-13 ENCOUNTER — Telehealth: Payer: Self-pay | Admitting: Family Medicine

## 2015-06-13 NOTE — Telephone Encounter (Signed)
ALPRAZolam (XANAX) 1 MG tablet traMADol (ULTRAM) 50 MG tablet  Pt stated that these two meds have not been sent to [facility] She has an appt for Monday at three but doesn't feel she can be with out  Them till then.    Can we resend them?

## 2015-06-13 NOTE — Telephone Encounter (Signed)
Left message on voicemail notifying patient that scripts were resent to pharmacy.

## 2015-06-17 ENCOUNTER — Ambulatory Visit (INDEPENDENT_AMBULATORY_CARE_PROVIDER_SITE_OTHER): Payer: Medicare Other | Admitting: Family Medicine

## 2015-06-17 ENCOUNTER — Encounter: Payer: Self-pay | Admitting: Family Medicine

## 2015-06-17 VITALS — BP 122/86 | Ht 67.0 in | Wt 238.8 lb

## 2015-06-17 DIAGNOSIS — I255 Ischemic cardiomyopathy: Secondary | ICD-10-CM

## 2015-06-17 DIAGNOSIS — G629 Polyneuropathy, unspecified: Secondary | ICD-10-CM | POA: Diagnosis not present

## 2015-06-17 DIAGNOSIS — I10 Essential (primary) hypertension: Secondary | ICD-10-CM

## 2015-06-17 MED ORDER — ALPRAZOLAM 1 MG PO TABS: ORAL_TABLET | ORAL | Status: DC

## 2015-06-17 MED ORDER — TRAMADOL HCL 50 MG PO TABS: ORAL_TABLET | ORAL | Status: DC

## 2015-06-17 NOTE — Progress Notes (Signed)
   Subjective:    Patient ID: [name] [name], female    DOB: Sep 20, 1945, 70 y.o.   MRN: [id number]  Hypertension This is a chronic problem. The current episode started more than 1 year ago. Risk factors for coronary artery disease include diabetes mellitus, dyslipidemia, obesity, post-menopausal state and sedentary lifestyle. Treatments tried: coreg,lasix, vasotec. There are no compliance problems.    a day compliant with blood pressure medication no obvious side effects. Watching salt intake.  Notes that the nighttime all TRAM has helped her neuropathic pain. No obvious side effects. Compliant with it.  States definitely needs Xanax as needed for anxiety. Next  Notes progressive trouble with smell and subsequently taste.Marland Kitchen. She went and saw ear nose and throat specialist. No obvious etiology noted The patient is adamant that chemicals in her dentures are causing her loss of smell   Review of Systems No headache no chest pain no back pain no abdominal pain no change in bowel habits    Objective:   Physical Exam  Alert vital stable obesity present HEENT stable lungs clear. Heart regular in rhythm. Ankles without edema. Feet pulses present diminished sensation     Assessment & Plan:  #2 hypertension good control discussed #3 anxiety ongoing challenges with occasional need for Xanax No. 4 diminished smell with patient. That it is coming from chemicals weights from her dentures plan advised her to get back with her dentist. Medications refilled. Diet exercise discussed. Encouraged to keep working on diet recheck in 6 months

## 2015-07-11 ENCOUNTER — Other Ambulatory Visit: Payer: Self-pay | Admitting: Internal Medicine

## 2015-07-16 ENCOUNTER — Ambulatory Visit (INDEPENDENT_AMBULATORY_CARE_PROVIDER_SITE_OTHER): Payer: Medicare Other | Admitting: *Deleted

## 2015-07-16 DIAGNOSIS — Z950 Presence of cardiac pacemaker: Secondary | ICD-10-CM | POA: Diagnosis not present

## 2015-07-16 DIAGNOSIS — I4891 Unspecified atrial fibrillation: Secondary | ICD-10-CM

## 2015-07-16 DIAGNOSIS — I255 Ischemic cardiomyopathy: Secondary | ICD-10-CM | POA: Diagnosis not present

## 2015-07-16 NOTE — Progress Notes (Signed)
Remote pacemaker transmission.   

## 2015-08-02 ENCOUNTER — Other Ambulatory Visit: Payer: Self-pay | Admitting: "Endocrinology

## 2015-08-02 DIAGNOSIS — E1159 Type 2 diabetes mellitus with other circulatory complications: Secondary | ICD-10-CM | POA: Diagnosis not present

## 2015-08-02 DIAGNOSIS — E038 Other specified hypothyroidism: Secondary | ICD-10-CM | POA: Diagnosis not present

## 2015-08-02 LAB — BASIC METABOLIC PANEL
BUN: 19 mg/dL (ref 7–25)
CHLORIDE: 100 mmol/L (ref 98–110)
CO2: 28 mmol/L (ref 20–31)
Calcium: 9.4 mg/dL (ref 8.6–10.4)
Creat: 0.75 mg/dL (ref 0.50–0.99)
Glucose, Bld: 185 mg/dL — ABNORMAL HIGH (ref 65–99)
POTASSIUM: 5 mmol/L (ref 3.5–5.3)
Sodium: 137 mmol/L (ref 135–146)

## 2015-08-02 LAB — HEMOGLOBIN A1C
HEMOGLOBIN A1C: 7.8 % — AB (ref <5.7)
MEAN PLASMA GLUCOSE: 177 mg/dL

## 2015-08-02 LAB — TSH: TSH: 2.41 mIU/L

## 2015-08-02 LAB — T4, FREE: FREE T4: 1.1 ng/dL (ref 0.8–1.8)

## 2015-08-08 ENCOUNTER — Ambulatory Visit (INDEPENDENT_AMBULATORY_CARE_PROVIDER_SITE_OTHER): Payer: Medicare Other | Admitting: "Endocrinology

## 2015-08-08 ENCOUNTER — Encounter: Payer: Self-pay | Admitting: "Endocrinology

## 2015-08-08 VITALS — BP 116/61 | HR 79 | Ht 67.0 in | Wt 239.0 lb

## 2015-08-08 DIAGNOSIS — E785 Hyperlipidemia, unspecified: Secondary | ICD-10-CM

## 2015-08-08 DIAGNOSIS — I10 Essential (primary) hypertension: Secondary | ICD-10-CM

## 2015-08-08 DIAGNOSIS — E1159 Type 2 diabetes mellitus with other circulatory complications: Secondary | ICD-10-CM

## 2015-08-08 DIAGNOSIS — I255 Ischemic cardiomyopathy: Secondary | ICD-10-CM | POA: Diagnosis not present

## 2015-08-08 DIAGNOSIS — E038 Other specified hypothyroidism: Secondary | ICD-10-CM

## 2015-08-08 NOTE — Patient Instructions (Signed)

## 2015-08-08 NOTE — Progress Notes (Signed)
Subjective:    Patient ID: [name] [name], female    DOB: November 24, 1945, PCP Lubertha South, MD   Past Medical History  Diagnosis Date  . Chronic systolic heart failure (HCC)   . Diabetes mellitus, type 2 (HCC)   . Hyperlipidemia   . Essential hypertension, benign   . Hypothyroidism   . PVD (peripheral vascular disease) (HCC)   . Coronary atherosclerosis of native coronary artery     Multivessel, patent grafts 2007  . Ischemic cardiomyopathy     Ejection Fraction of 25% improved to 45-50% following biventicular pacing, Status post biventricular dual-chamber ICD - [employer]  . Hypothyroidism   . Cardiac defibrillator in place   . Colon polyps     20 yrs ago  . Ulcer   . Myocardial infarction (HCC)   . Atrial fibrillation [facility])    Past Surgical History  Procedure Laterality Date  . Coronary artery bypass graft  2001    [facility] - LIMA to LAD, SVG to ramus, SVG to OM/PLB, SVG to RCA   . Appendectomy    . Bilateral tubal ligation    . Cardiac defibrillator placement      [employer]  . Flexible sigmoidoscopy  07/19/1998    Dr. Karilyn Cota  . Tonsillectomy    . Cesarean section    . Abdominal aortagram N/A 08/04/2013    Procedure: ABDOMINAL Ronny Flurry;  Surgeon: Sherren Kerns, MD;  Location: [facility] CATH LAB;  Service: Cardiovascular;  Laterality: N/A;  . Bi-ventricular pacemaker insertion N/A 11/20/2013    Procedure: BI-VENTRICULAR PACEMAKER INSERTION (CRT-P);  Surgeon: Marinus Maw, MD;  Location: [facility] CATH LAB;  Service: Cardiovascular;  Laterality: N/A;   Social History   Social History  . Marital Status: Divorced    Spouse Name: N/A  . Number of Children: 1  . Years of Education: N/A   Occupational History  . Full Time: [employer]   . Disabled   .     Social History Main Topics  . Smoking status: Former Smoker    Quit date: 09/16/1991  . Smokeless tobacco: Never Used  . Alcohol Use: No  . Drug Use: No  . Sexual Activity:  No   Other Topics Concern  . None   Social History Narrative   Outpatient Encounter Prescriptions as of 08/08/2015  Medication Sig  . ALPRAZolam (XANAX) 1 MG tablet TAKE 1/2-1 TABLET 2 TIMES A DAY AS NEEDED FOR ANXIETY  . B-D ULTRAFINE III SHORT PEN 31G X 8 MM MISC USE 1 DAILY AT BEDTIME.  Marland Kitchen Blood Glucose Monitoring Suppl (ACCU-CHEK AVIVA) device Use as instructed  . carvedilol (COREG) 25 MG tablet TAKE ONE TABLET TWICE DAILY WITH A MEAL  . Cholecalciferol (VITAMIN D) 400 UNITS capsule Take 400 Units by mouth daily.  Marland Kitchen ELIQUIS 5 MG TABS tablet TAKE ONE TABLET TWICE DAILY  . enalapril (VASOTEC) 10 MG tablet TAKE ONE TABLET TWICE DAILY  . furosemide (LASIX) 40 MG tablet TAKE ONE TABLET TWICE DAILY  . furosemide (LASIX) 40 MG tablet TAKE ONE TABLET TWICE DAILY  . glucose blood (ACCU-CHEK AVIVA) test strip Use to test glucose 4 times a day  . insulin aspart (NOVOLOG) 100 UNIT/ML injection Inject 20-26 Units into the skin 3 (three) times daily with meals.  . insulin glargine (LANTUS) 100 UNIT/ML injection Inject 0.8 mLs (80 Units total) into the skin at bedtime.  . Insulin Syringe-Needle U-100 (B-D INS SYR ULTRAFINE 1CC/30G) 30G X 1/2" 1  ML MISC Use to inject insulin 4 times a day  . levothyroxine (SYNTHROID, LEVOTHROID) 125 MCG tablet Take 1 tablet (125 mcg total) by mouth daily.  . Liraglutide (VICTOZA) 18 MG/3ML SOPN Inject 0.3 mLs (1.8 mg total) into the skin once.  . multivitamin (THERAGRAN) per tablet Take 1 tablet by mouth daily.    . Omega-3 Fatty Acids (FISH OIL) 1000 MG CAPS Take 1,000 mg by mouth daily.  Marland Kitchen OVER THE COUNTER MEDICATION Take 1 tablet by mouth daily. Phyomega vitamin.  . potassium chloride SA (K-DUR,KLOR-CON) 20 MEQ tablet TAKE ONE TABLET BY MOUTH TWICE A DAY  . traMADol (ULTRAM) 50 MG tablet TAKE ONE (1) TABLET EACH DAY AS NEEDED FOR MODERATE PAIN  . vitamin E 600 UNIT capsule Take 600 Units by mouth daily.     No facility-administered encounter medications on file  as of 08/08/2015.   ALLERGIES: Allergies  Allergen Reactions  . Avandia [Rosiglitazone] Other (See Comments)    CHF  . Celebrex [Celecoxib] Other (See Comments)    Congestive heart failure  . Tape     Left rash per patient   VACCINATION STATUS: Immunization History  Administered Date(s) Administered  . Influenza,inj,Quad PF,36+ Mos 01/10/2013, 01/01/2014, 12/13/2014  . Influenza-Unspecified 01/11/2012  . Pneumococcal Conjugate-13 11/13/2013  . Pneumococcal Polysaccharide-23 08/02/2012  . Zoster 11/25/2009    Diabetes She presents for her follow-up diabetic visit. She has type 2 diabetes mellitus. Onset time: She was diagnosed at approximate age of 34 years. Her disease course has been stable. There are no hypoglycemic associated symptoms. Pertinent negatives for hypoglycemia include no confusion, headaches, pallor or seizures. There are no diabetic associated symptoms. Pertinent negatives for diabetes include no chest pain, no polydipsia, no polyphagia and no polyuria. There are no hypoglycemic complications. Symptoms are stable. Diabetic complications include heart disease. Risk factors for coronary artery disease include diabetes mellitus, dyslipidemia, hypertension, obesity, sedentary lifestyle and tobacco exposure. She is compliant with treatment most of the time. Her weight is stable. She is following a generally unhealthy diet. She has had a previous visit with a dietitian. Her breakfast blood glucose range is generally 140-180 mg/dl. Her lunch blood glucose range is generally 140-180 mg/dl. Her dinner blood glucose range is generally 140-180 mg/dl. Her overall blood glucose range is 140-180 mg/dl. An ACE inhibitor/angiotensin II receptor blocker is being taken.  Hyperlipidemia This is a chronic problem. The current episode started more than 1 year ago. Pertinent negatives include no chest pain, myalgias or shortness of breath. Current antihyperlipidemic treatment includes statins. Risk  factors for coronary artery disease include dyslipidemia, diabetes mellitus, hypertension and a sedentary lifestyle.  Hypertension This is a chronic problem. The current episode started more than 1 year ago. Pertinent negatives include no chest pain, headaches, palpitations or shortness of breath. Past treatments include ACE inhibitors. Compliance problems include diet.  Hypertensive end-organ damage includes CAD/MI and a thyroid problem.  Thyroid Problem Presents for follow-up visit. Patient reports no cold intolerance, diarrhea, heat intolerance or palpitations. The symptoms have been improving. Past treatments include levothyroxine. Her past medical history is significant for hyperlipidemia.     Review of Systems  Constitutional: Negative for fever, chills and unexpected weight change.  HENT: Negative for trouble swallowing and voice change.   Eyes: Negative for visual disturbance.  Respiratory: Negative for cough, shortness of breath and wheezing.   Cardiovascular: Negative for chest pain, palpitations and leg swelling.  Gastrointestinal: Negative for nausea, vomiting and diarrhea.  Endocrine: Negative for cold intolerance,  heat intolerance, polydipsia, polyphagia and polyuria.  Musculoskeletal: Negative for myalgias and arthralgias.  Skin: Negative for color change, pallor, rash and wound.  Neurological: Negative for seizures and headaches.  Psychiatric/Behavioral: Negative for suicidal ideas and confusion.    Objective:    BP 116/61 mmHg  Pulse 79  Ht 5\' 7"  (1.702 m)  Wt 239 lb (108.41 kg)  BMI 37.42 kg/m2  SpO2 96%  Wt Readings from Last 3 Encounters:  08/08/15 239 lb (108.41 kg)  06/17/15 238 lb 12.8 oz (108.319 kg)  05/06/15 242 lb (109.77 kg)    Physical Exam  Constitutional: She is oriented to person, place, and time. She appears well-developed.  HENT:  Head: Normocephalic and atraumatic.  Eyes: EOM are normal.  Neck: Normal range of motion. Neck supple. No tracheal  deviation present. No thyromegaly present.  Cardiovascular: Normal rate and regular rhythm.   Pulmonary/Chest: Effort normal and breath sounds normal.  Abdominal: Soft. Bowel sounds are normal. There is no tenderness. There is no guarding.  Musculoskeletal: Normal range of motion. She exhibits no edema.  Neurological: She is alert and oriented to person, place, and time. She has normal reflexes. No cranial nerve deficit. Coordination normal.  Skin: Skin is warm and dry. No rash noted. No erythema. No pallor.  Psychiatric: She has a normal mood and affect. Judgment normal.     CMP ( most recent) CMP     Component Value Date/Time   NA 137 08/02/2015 1019   K 5.0 08/02/2015 1019   CL 100 08/02/2015 1019   CO2 28 08/02/2015 1019   GLUCOSE 185* 08/02/2015 1019   BUN 19 08/02/2015 1019   CREATININE 0.75 08/02/2015 1019   CREATININE 0.73 11/20/2013 0827   CALCIUM 9.4 08/02/2015 1019   PROT 6.8 02/26/2012 0521   ALBUMIN 3.7 02/26/2012 0521   AST 31 02/26/2012 0521   ALT 35 02/26/2012 0521   ALKPHOS 45 02/26/2012 0521   BILITOT 0.5 02/26/2012 0521   GFRNONAA 87* 11/20/2013 0827   GFRNONAA 63 01/10/2013 1659   GFRAA >90 11/20/2013 0827   GFRAA 73 01/10/2013 1659     Diabetic Labs (most recent): Lab Results  Component Value Date   HGBA1C 7.8* 08/02/2015   HGBA1C 7.5* 06/24/2009   HGBA1C 8.8* 05/14/2008     Lipid Panel ( most recent) Lipid Panel     Component Value Date/Time   CHOL 142 07/31/2008 1508   TRIG 379.0* 07/31/2008 1508   HDL 31.60* 07/31/2008 1508   CHOLHDL 4 07/31/2008 1508   VLDL 75.8* 07/31/2008 1508   LDLCALC 68 05/14/2008 1131   LDLDIRECT 60.5 07/31/2008 1508     Assessment & Plan:   1. Type 2 diabetes mellitus with vascular disease (HCC) -Her diabetes is  complicated by coronary artery disease and patient remains at a high risk for more acute and chronic complications of diabetes which include CAD, CVA, CKD, retinopathy, and neuropathy. These are  all discussed in detail with the patient.  Patient came with near target glucose profile, and  recent A1c of 7.8 %.  Glucose logs and insulin administration records pertaining to this visit,  to be scanned into patient's records.  Recent labs reviewed.   - I have re-counseled the patient on diet management and weight loss  by adopting a carbohydrate restricted / protein rich  Diet.  - Suggestion is made for patient to avoid simple carbohydrates   from their diet including Cakes , Desserts, Ice Cream,  Soda (  diet and  regular) , Sweet Tea , Candies,  Chips, Cookies, Artificial Sweeteners,   and "Sugar-free" Products .  This will help patient to have stable blood glucose profile and potentially avoid unintended  Weight gain.  - Patient is advised to stick to a routine mealtimes to eat 3 meals  a day and avoid unnecessary snacks ( to snack only to correct hypoglycemia).  - The patient  has been  scheduled with Norm Salt, RDN, CDE for individualized DM education.  - I have approached patient with the following individualized plan to manage diabetes and patient agrees.  - I will proceed with basal insulin Lantus 80 units QHS, and prandial insulin NovoLog 20 units TIDAC for pre-meal BG readings of 90-150mg /dl, plus patient specific correction dose of rapid acting insulin  for unexpected hyperglycemia above 150mg /dl, associated with strict monitoring of glucose  AC and HS. - Patient is warned not to take insulin without proper monitoring per orders. -Adjustment parameters are given for hypo and hyperglycemia in writing. -Patient is encouraged to call clinic for blood glucose levels less than 70 or above 300 mg /dl. - I will continue Victoza 1.8 mg subcutaneously daily, therapeutically suitable for patient.  - Patient specific target  for A1c; LDL, HDL, Triglycerides, and  Waist Circumference were discussed in detail.  2) BP/HTN: Controlled. Continue current medications including ACEI/ARB. 3)  Lipids/HPL:  continue   simvastatin.  4)  Weight/Diet: CDE consult in progress, exercise, and carbohydrates information provided. 5) hypothyroidism: She would benefit from slight increase in her levothyroxine. I will increase to 125 g by mouth every morning.  - We discussed about correct intake of levothyroxine, at fasting, with water, separated by at least 30 minutes from breakfast, and separated by more than 4 hours from calcium, iron, multivitamins, acid reflux medications (PPIs). -Patient is made aware of the fact that thyroid hormone replacement is needed for life, dose to be adjusted by periodic monitoring of thyroid function tests.  6) Chronic Care/Health Maintenance:  -Patient is on ACEI/ARB and Statin medications and encouraged to continue to follow up with Ophthalmology, Podiatrist at least yearly or according to recommendations, and advised to  stay away from smoking. I have recommended yearly flu vaccine and pneumonia vaccination at least every 5 years; moderate intensity exercise for up to 150 minutes weekly; and  sleep for at least 7 hours a day.  - 25 minutes of time was spent on the care of this patient , 50% of which was applied for counseling on diabetes complications and their preventions.  - I advised patient to maintain close follow up with Lubertha South, MD for primary care needs.  Patient is asked to bring meter and  blood glucose logs during their next visit.   Follow up plan: -Return in about 3 months (around 11/07/2015) for diabetes, high blood pressure, high cholesterol, underactive thyroid, follow up with pre-visit labs, meter, and logs.  Marquis Lunch, MD Phone: [phone]  Fax: [phone]   08/08/2015, 2:49 PM

## 2015-08-12 ENCOUNTER — Other Ambulatory Visit: Payer: Self-pay | Admitting: Internal Medicine

## 2015-08-12 ENCOUNTER — Other Ambulatory Visit: Payer: Self-pay | Admitting: "Endocrinology

## 2015-09-05 ENCOUNTER — Encounter: Payer: Self-pay | Admitting: Cardiology

## 2015-09-05 LAB — CUP PACEART REMOTE DEVICE CHECK
Battery Remaining Longevity: 120 mo
Battery Remaining Percentage: 100 %
Brady Statistic RV Percent Paced: 96 %
Date Time Interrogation Session: 20170404082100
Implantable Lead Location: 753860
Implantable Lead Model: 4135
Implantable Lead Serial Number: 158281
Implantable Lead Serial Number: 28377340
Lead Channel Setting Pacing Amplitude: 1.5 V
Lead Channel Setting Pacing Amplitude: 2.4 V
Lead Channel Setting Pacing Pulse Width: 0.4 ms
MDC IDC LEAD IMPLANT DT: 20090202
MDC IDC LEAD IMPLANT DT: 20090202
MDC IDC LEAD IMPLANT DT: 20090202
MDC IDC LEAD LOCATION: 753858
MDC IDC LEAD LOCATION: 753859
MDC IDC LEAD MODEL: 157
MDC IDC LEAD MODEL: 4554
MDC IDC LEAD SERIAL: 138236
MDC IDC MSMT LEADCHNL LV IMPEDANCE VALUE: 708 Ohm
MDC IDC MSMT LEADCHNL LV PACING THRESHOLD AMPLITUDE: 0.8 V
MDC IDC MSMT LEADCHNL LV PACING THRESHOLD PULSEWIDTH: 0.5 ms
MDC IDC MSMT LEADCHNL RA IMPEDANCE VALUE: 627 Ohm
MDC IDC MSMT LEADCHNL RV IMPEDANCE VALUE: 624 Ohm
MDC IDC SET LEADCHNL LV PACING PULSEWIDTH: 0.5 ms
MDC IDC SET LEADCHNL LV SENSING SENSITIVITY: 2.5 mV
MDC IDC SET LEADCHNL RV SENSING SENSITIVITY: 2.5 mV
MDC IDC STAT BRADY RA PERCENT PACED: 0 %
Pulse Gen Serial Number: 118262

## 2015-09-13 ENCOUNTER — Encounter: Payer: Medicare Other | Admitting: Cardiology

## 2015-09-13 ENCOUNTER — Encounter: Payer: Self-pay | Admitting: Cardiology

## 2015-09-13 NOTE — Progress Notes (Signed)
Patient canceled.  This encounter was created in error - please disregard. 

## 2015-09-26 ENCOUNTER — Ambulatory Visit: Payer: Medicare Other | Admitting: Cardiology

## 2015-09-28 ENCOUNTER — Other Ambulatory Visit: Payer: Self-pay | Admitting: "Endocrinology

## 2015-10-01 ENCOUNTER — Encounter: Payer: Self-pay | Admitting: Family Medicine

## 2015-10-16 ENCOUNTER — Telehealth: Payer: Self-pay | Admitting: Cardiology

## 2015-10-16 ENCOUNTER — Encounter: Payer: Medicare Other | Admitting: *Deleted

## 2015-10-16 NOTE — Telephone Encounter (Signed)
LMOVM reminding pt to send remote transmission.   

## 2015-10-18 ENCOUNTER — Encounter: Payer: Self-pay | Admitting: Cardiology

## 2015-10-24 ENCOUNTER — Ambulatory Visit (INDEPENDENT_AMBULATORY_CARE_PROVIDER_SITE_OTHER): Payer: Medicare Other | Admitting: Cardiology

## 2015-10-24 ENCOUNTER — Encounter: Payer: Self-pay | Admitting: Cardiology

## 2015-10-24 VITALS — BP 156/78 | HR 67 | Ht 67.0 in | Wt 239.0 lb

## 2015-10-24 DIAGNOSIS — I2510 Atherosclerotic heart disease of native coronary artery without angina pectoris: Secondary | ICD-10-CM | POA: Diagnosis not present

## 2015-10-24 DIAGNOSIS — Z79899 Other long term (current) drug therapy: Secondary | ICD-10-CM

## 2015-10-24 DIAGNOSIS — I739 Peripheral vascular disease, unspecified: Secondary | ICD-10-CM

## 2015-10-24 DIAGNOSIS — I480 Paroxysmal atrial fibrillation: Secondary | ICD-10-CM

## 2015-10-24 DIAGNOSIS — I255 Ischemic cardiomyopathy: Secondary | ICD-10-CM | POA: Diagnosis not present

## 2015-10-24 NOTE — Progress Notes (Signed)
Cardiology Office Note  Date: 10/24/2015   ID: [name] [name], DOB [birth year], MRN [id number]  PCP: [name] South, MD  Primary Cardiologist: [name] Dell, MD   Chief Complaint  Patient presents with  . Atrial Fibrillation  . Cardiomyopathy    History of Present Illness: [name] [name] is a 70 y.o. female last seen in September 2016. She presents for a routine follow-up visit. Reports no major change in status since our last visit. She has had no palpitations orsyncope. She does not report any exertional angina.  She continues to follow with Dr. Ladona [name] with [facility] biventricular pacemaker in place. She has had normal device function and no recent shocks.  We did discuss a follow-up echocardiogram around the time of her last visit, but she was not able to present for the study. We are going to try and reschedule this.  I have reviewed her most recent lab work, she has not had a CBC. She does not report any bleeding problems on Eliquis.  Past Medical History  Diagnosis Date  . Chronic systolic heart failure (HCC)   . Diabetes mellitus, type 2 (HCC)   . Hyperlipidemia   . Essential hypertension, benign   . Hypothyroidism   . PVD (peripheral vascular disease) (HCC)   . Coronary atherosclerosis of native coronary artery     Multivessel, patent grafts 2007  . Ischemic cardiomyopathy     Ejection Fraction of 25% improved to 45-50% following biventicular pacing, Status post biventricular dual-chamber ICD - [employer]  . Hypothyroidism   . Cardiac defibrillator in place   . Colon polyps     20 yrs ago  . Ulcer   . Myocardial infarction (HCC)   . Atrial fibrillation [facility])     Past Surgical History  Procedure Laterality Date  . Coronary artery bypass graft  2001    [facility] - LIMA to LAD, SVG to ramus, SVG to OM/PLB, SVG to RCA   . Appendectomy    . Bilateral tubal ligation    . Cardiac defibrillator placement      [employer]    . Flexible sigmoidoscopy  07/19/1998    [name] [name]  . Tonsillectomy    . Cesarean section    . Abdominal aortagram N/A 08/04/2013    Procedure: ABDOMINAL Ronny Flurry;  Surgeon: [name] Kerns, MD;  Location: [facility] CATH LAB;  Service: Cardiovascular;  Laterality: N/A;  . Bi-ventricular pacemaker insertion N/A 11/20/2013    Procedure: BI-VENTRICULAR PACEMAKER INSERTION (CRT-P);  Surgeon: [name] Maw, MD;  Location: [facility] CATH LAB;  Service: Cardiovascular;  Laterality: N/A;    Current Outpatient Prescriptions  Medication Sig Dispense Refill  . ALPRAZolam (XANAX) 1 MG tablet TAKE 1/2-1 TABLET 2 TIMES A DAY AS NEEDED FOR ANXIETY 60 tablet 5  . B-D ULTRAFINE III SHORT PEN 31G X 8 MM MISC USE 1 DAILY AT BEDTIME. 100 each 3  . Blood Glucose Monitoring Suppl (ACCU-CHEK AVIVA) device Use as instructed 1 each 0  . carvedilol (COREG) 25 MG tablet TAKE ONE TABLET TWICE DAILY WITH A MEAL 60 tablet 6  . Cholecalciferol (VITAMIN D) 400 UNITS capsule Take 400 Units by mouth daily.    Marland Kitchen ELIQUIS 5 MG TABS tablet TAKE ONE TABLET TWICE DAILY 60 tablet 6  . enalapril (VASOTEC) 10 MG tablet TAKE ONE TABLET TWICE DAILY 180 tablet 3  . furosemide (LASIX) 40 MG tablet TAKE ONE TABLET TWICE DAILY 60 tablet 6  . furosemide (LASIX) 40 MG  tablet TAKE ONE TABLET TWICE DAILY 60 tablet 11  . glucose blood (ACCU-CHEK AVIVA) test strip Use to test glucose 4 times a day 150 each 3  . insulin aspart (NOVOLOG) 100 UNIT/ML injection Inject 20-26 Units into the skin 3 (three) times daily with meals. 50 mL 2  . insulin glargine (LANTUS) 100 UNIT/ML injection Inject 0.8 mLs (80 Units total) into the skin at bedtime. 3 mL 2  . Insulin Syringe-Needle U-100 (B-D INS SYR ULTRAFINE 1CC/30G) 30G X 1/2" 1 ML MISC Use to inject insulin 4 times a day 150 each 3  . multivitamin (THERAGRAN) per tablet Take 1 tablet by mouth daily.      . Omega-3 Fatty Acids (FISH OIL) 1000 MG CAPS Take 1,000 mg by mouth daily.    Marland Kitchen OVER THE COUNTER MEDICATION  Take 1 tablet by mouth daily. Phyomega vitamin.    . potassium chloride SA (K-DUR,KLOR-CON) 20 MEQ tablet TAKE ONE TABLET TWICE DAILY 60 tablet 3  . SYNTHROID 125 MCG tablet TAKE ONE (1) TABLET BY MOUTH EVERY DAY 30 tablet 3  . traMADol (ULTRAM) 50 MG tablet TAKE ONE (1) TABLET EACH DAY AS NEEDED FOR MODERATE PAIN 30 tablet 5  . VICTOZA 18 MG/3ML SOPN INJECT 0.3 (1.8 MG TOTAL) INTO THE SKIN OONCE DAILY 9 mL 2  . vitamin E 600 UNIT capsule Take 600 Units by mouth daily.       No current facility-administered medications for this visit.   Allergies:  Avandia; Celebrex; and Tape   Social History: The patient  reports that she quit smoking about 24 years ago. She has never used smokeless tobacco. She reports that she does not drink alcohol or use illicit drugs.   ROS:  Please see the history of present illness. Otherwise, complete review of systems is positive for stable claudication.  All other systems are reviewed and negative.   Physical Exam: VS:  BP 156/78 mmHg  Pulse 67  Ht 5\' 7"  (1.702 m)  Wt 239 lb (108.41 kg)  BMI 37.42 kg/m2  SpO2 97%, BMI Body mass index is 37.42 kg/(m^2).  Wt Readings from Last 3 Encounters:  10/24/15 239 lb (108.41 kg)  08/08/15 239 lb (108.41 kg)  06/17/15 238 lb 12.8 oz (108.319 kg)    Obese woman in NAD. Using a cane. HEENT: Conjunctiva and lids normal, oropharynx clear.  Neck: Supple, increased girth, no carotid bruits, no thyromegaly.  Lungs: Clear to auscultation, nonlabored breathing at rest.  Cardiac: Regular rate and rhythm, no S3, no pericardial rub.  Abdomen: Soft, nontender, bowel sounds present, no guarding or rebound.  Extremities: Trace leg edema, distal pulses 1-2+. Orthopedic support shoe on left. Skin: Warm and dry.  Musculoskeletal: No kyphosis.  Neuropsychiatric: Alert and oriented x3, affect grossly appropriate.  ECG: I personally reviewed the tracing from 01/09/2015 which showed a ventricular paced rhythm.  Recent  Labwork: 08/02/2015: BUN 19; Creat 0.75; Potassium 5.0; Sodium 137; TSH 2.41     Component Value Date/Time   CHOL 142 07/31/2008 1508   TRIG 379.0* 07/31/2008 1508   HDL 31.60* 07/31/2008 1508   CHOLHDL 4 07/31/2008 1508   VLDL 75.8* 07/31/2008 1508   LDLCALC 68 05/14/2008 1131   LDLDIRECT 60.5 07/31/2008 1508    Other Studies Reviewed Today:  Echocardiogram 01/11/2013: Study Conclusions  - Left ventricle: The cavity size was severely dilated. Wall thickness was normal. Systolic function was mildly to moderately reduced. The estimated ejection fraction was in the range of 40% to 45%. Indeterminate  diastolicfunction.There is evidence of elevated left atrial pressure. - Aortic valve: Moderately calcified annulus. Moderately thickened leaflets, uncertain number of leaflets. Valve area: 1.61cm^2(VTI). Valve area: 1.59cm^2 (Vmax). - Mitral valve: Mildly calcified annulus. Mildly thickened leaflets . Mild regurgitation. - Left atrium: The atrium was severely dilated. - Right ventricle: The cavity size was moderately dilated. Systolic function was moderately reduced, RV TAPSE is 1.2 cm. - Right atrium: The atrium was mildly dilated.  Assessment and Plan:  1. CAD status post CABG in 2001, patent bypass grafts as of 2007. Continue medical therapy and observation.  2. Ischemic cardiomyopathy, LVEF 40-45% as of 2014. We are arranging a follow-up echocardiogram.  3. [employer] biventricular pacemaker, followed by Dr. Ladona Ridgelaylor. She continues with remote device transmissions.  4. Paroxysmal atrial fibrillation, asymptomatic. Continue Eliquis. Follow-up CBC with pending lab work.  5. PAD, followed by Dr. Darrick PennaFields.  Current medicines were reviewed with the patient today.   Orders Placed This Encounter  Procedures  . CBC  . ECHOCARDIOGRAM COMPLETE    Disposition: Follow-up with me in 6 months.  Signed, Jonelle SidleSamuel G. Javaeh Muscatello, MD, [facility] 10/24/2015 9:30 AM     [facility] [address], [place], [place] [address] Phone: [phone]; Fax: [phone]

## 2015-10-24 NOTE — Patient Instructions (Addendum)
Your physician wants you to follow-up in: 6 months with Dr McDowell You will receive a reminder letter in the mail two months in advance. If you don't receive a letter, please call our office to schedule the follow-up appointment.    Your physician recommends that you continue on your current medications as directed. Please refer to the Current Medication list given to you today.    Your physician has requested that you have an echocardiogram. Echocardiography is a painless test that uses sound waves to create images of your heart. It provides your doctor with information about the size and shape of your heart and how well your heart's chambers and valves are working. This procedure takes approximately one hour. There are no restrictions for this procedure.     Thank you for choosing [facility] !        

## 2015-10-29 ENCOUNTER — Other Ambulatory Visit: Payer: Self-pay | Admitting: "Endocrinology

## 2015-10-29 ENCOUNTER — Ambulatory Visit (HOSPITAL_COMMUNITY)
Admission: RE | Admit: 2015-10-29 | Discharge: 2015-10-29 | Disposition: A | Discharge status: Home / Self Care | Payer: Medicare Other | Source: Ambulatory Visit | Attending: Cardiology | Admitting: Cardiology

## 2015-10-29 DIAGNOSIS — I2510 Atherosclerotic heart disease of native coronary artery without angina pectoris: Secondary | ICD-10-CM | POA: Diagnosis not present

## 2015-10-29 DIAGNOSIS — I255 Ischemic cardiomyopathy: Secondary | ICD-10-CM

## 2015-10-29 DIAGNOSIS — R29898 Other symptoms and signs involving the musculoskeletal system: Secondary | ICD-10-CM | POA: Diagnosis not present

## 2015-10-29 DIAGNOSIS — I119 Hypertensive heart disease without heart failure: Secondary | ICD-10-CM | POA: Diagnosis not present

## 2015-10-29 DIAGNOSIS — Z6837 Body mass index (BMI) 37.0-37.9, adult: Secondary | ICD-10-CM | POA: Insufficient documentation

## 2015-10-29 DIAGNOSIS — Z79899 Other long term (current) drug therapy: Secondary | ICD-10-CM | POA: Diagnosis not present

## 2015-10-29 DIAGNOSIS — I071 Rheumatic tricuspid insufficiency: Secondary | ICD-10-CM | POA: Insufficient documentation

## 2015-10-29 DIAGNOSIS — I352 Nonrheumatic aortic (valve) stenosis with insufficiency: Secondary | ICD-10-CM | POA: Insufficient documentation

## 2015-10-29 DIAGNOSIS — Z951 Presence of aortocoronary bypass graft: Secondary | ICD-10-CM | POA: Insufficient documentation

## 2015-10-29 DIAGNOSIS — I340 Nonrheumatic mitral (valve) insufficiency: Secondary | ICD-10-CM | POA: Diagnosis not present

## 2015-10-29 DIAGNOSIS — E119 Type 2 diabetes mellitus without complications: Secondary | ICD-10-CM | POA: Diagnosis not present

## 2015-10-29 DIAGNOSIS — E785 Hyperlipidemia, unspecified: Secondary | ICD-10-CM | POA: Insufficient documentation

## 2015-10-29 DIAGNOSIS — E1159 Type 2 diabetes mellitus with other circulatory complications: Secondary | ICD-10-CM | POA: Diagnosis not present

## 2015-10-29 LAB — ECHOCARDIOGRAM COMPLETE
AO mean calculated velocity dopler: 143 cm/s
AOVTI: 48.9 cm
AV Area VTI index: 0.41 cm2/m2
AV Area VTI: 0.96 cm2
AV Area mean vel: 0.96 cm2
AV VEL mean LVOT/AV: 0.54
AV area mean vel ind: 0.42 cm2/m2
AVA: 0.94 cm2
AVG: 10 mmHg
AVLVOTPG: 5 mmHg
AVPG: 18 mmHg
AVPKVEL: 213 cm/s
Ao pk vel: 0.54 m/s
CHL CUP AV PEAK INDEX: 0.42
CHL CUP AV VALUE AREA INDEX: 0.41
CHL CUP AV VEL: 0.94
CHL CUP RV SYS PRESS: 32 mmHg
CHL CUP STROKE VOLUME: 59 mL
CHL CUP TV REG PEAK VELOCITY: 270 cm/s
E decel time: 246 msec
FS: 21 % — AB (ref 28–44)
IV/PV OW: 0.98
LA diam end sys: 54 mm
LA diam index: 2.34 cm/m2
LA vol index: 41.3 mL/m2
LA vol: 95.4 mL
LASIZE: 54 mm
LAVOLA4C: 80.9 mL
LV sys vol index: 35 mL/m2
LV sys vol: 80 mL — AB (ref 14–42)
LVDIAVOL: 139 mL — AB (ref 46–106)
LVDIAVOLIN: 60 mL/m2
LVOT SV: 46 mL
LVOT VTI: 25.9 cm
LVOT area: 1.77 cm2
LVOT diameter: 15 mm
LVOT peak VTI: 0.53 cm
LVOT peak vel: 115 cm/s
MV Dec: 246
MV VTI: 199 cm
MV pk E vel: 160 m/s
MVPG: 10 mmHg
PW: 10.7 mm — AB (ref 0.6–1.1)
RV TAPSE: 16.8 mm
Simpson's disk: 42
TR max vel: 270 cm/s

## 2015-10-29 LAB — COMPLETE METABOLIC PANEL WITH GFR
ALBUMIN: 4.1 g/dL (ref 3.6–5.1)
ALK PHOS: 48 U/L (ref 33–130)
ALT: 27 U/L (ref 6–29)
AST: 27 U/L (ref 10–35)
BILIRUBIN TOTAL: 0.7 mg/dL (ref 0.2–1.2)
BUN: 20 mg/dL (ref 7–25)
CALCIUM: 9.3 mg/dL (ref 8.6–10.4)
CO2: 27 mmol/L (ref 20–31)
Chloride: 102 mmol/L (ref 98–110)
Creat: 0.83 mg/dL (ref 0.50–0.99)
GFR, EST AFRICAN AMERICAN: 84 mL/min (ref >=60)
GFR, EST NON AFRICAN AMERICAN: 73 mL/min (ref >=60)
GLUCOSE: 210 mg/dL — AB (ref 65–99)
POTASSIUM: 4.8 mmol/L (ref 3.5–5.3)
SODIUM: 137 mmol/L (ref 135–146)
TOTAL PROTEIN: 7.1 g/dL (ref 6.1–8.1)

## 2015-10-29 LAB — CBC
HCT: 40.1 % (ref 35.0–45.0)
Hemoglobin: 12.9 g/dL (ref 11.7–15.5)
MCH: 30.4 pg (ref 27.0–33.0)
MCHC: 32.2 g/dL (ref 32.0–36.0)
MCV: 94.4 fL (ref 80.0–100.0)
MPV: 10.2 fL (ref 7.5–12.5)
PLATELETS: 280 10*3/uL (ref 140–400)
RBC: 4.25 MIL/uL (ref 3.80–5.10)
RDW: 13.4 % (ref 11.0–15.0)
WBC: 6.1 10*3/uL (ref 3.8–10.8)

## 2015-10-29 LAB — LIPID PANEL
CHOL/HDL RATIO: 5.6 ratio — AB (ref <=5.0)
CHOLESTEROL: 162 mg/dL (ref 125–200)
HDL: 29 mg/dL — AB (ref >=46)
LDL Cholesterol: 91 mg/dL (ref <130)
Triglycerides: 212 mg/dL — ABNORMAL HIGH (ref <150)
VLDL: 42 mg/dL — ABNORMAL HIGH (ref <30)

## 2015-10-29 NOTE — Progress Notes (Signed)
*  PRELIMINARY RESULTS* Echocardiogram 2D Echocardiogram has been performed.  Stacey DrainWhite, Linus Weckerly J 10/29/2015, 10:23 AM

## 2015-10-30 ENCOUNTER — Telehealth: Payer: Self-pay | Admitting: Internal Medicine

## 2015-10-30 LAB — HEMOGLOBIN A1C
HEMOGLOBIN A1C: 8 % — AB (ref <5.7)
Mean Plasma Glucose: 183 mg/dL

## 2015-10-30 NOTE — Telephone Encounter (Signed)
Spoke w/ pt and gave her the communication number b/c pt needs a new power cord for her monitor.

## 2015-10-30 NOTE — Telephone Encounter (Signed)
New Message ° °Pt call requesting to speak with RN about sending transmission. Please call back to discuss  °

## 2015-11-07 ENCOUNTER — Ambulatory Visit (INDEPENDENT_AMBULATORY_CARE_PROVIDER_SITE_OTHER): Payer: Medicare Other | Admitting: *Deleted

## 2015-11-07 DIAGNOSIS — I255 Ischemic cardiomyopathy: Secondary | ICD-10-CM | POA: Diagnosis not present

## 2015-11-08 ENCOUNTER — Encounter: Payer: Self-pay | Admitting: "Endocrinology

## 2015-11-08 ENCOUNTER — Ambulatory Visit (INDEPENDENT_AMBULATORY_CARE_PROVIDER_SITE_OTHER): Payer: Medicare Other | Admitting: "Endocrinology

## 2015-11-08 VITALS — BP 157/74 | HR 80 | Ht 67.0 in | Wt 242.0 lb

## 2015-11-08 DIAGNOSIS — I10 Essential (primary) hypertension: Secondary | ICD-10-CM

## 2015-11-08 DIAGNOSIS — E038 Other specified hypothyroidism: Secondary | ICD-10-CM | POA: Diagnosis not present

## 2015-11-08 DIAGNOSIS — E1159 Type 2 diabetes mellitus with other circulatory complications: Secondary | ICD-10-CM | POA: Diagnosis not present

## 2015-11-08 DIAGNOSIS — E785 Hyperlipidemia, unspecified: Secondary | ICD-10-CM

## 2015-11-08 DIAGNOSIS — I255 Ischemic cardiomyopathy: Secondary | ICD-10-CM | POA: Diagnosis not present

## 2015-11-08 NOTE — Progress Notes (Signed)
Remote pacemaker transmission.   

## 2015-11-08 NOTE — Patient Instructions (Signed)

## 2015-11-08 NOTE — Progress Notes (Signed)
Subjective:    Patient ID: [name] [name], female    DOB: [birth year], PCP Lubertha South, MD   Past Medical History:  Diagnosis Date  . Atrial fibrillation (HCC)   . Cardiac defibrillator in place   . Chronic systolic heart failure (HCC)   . Colon polyps    20 yrs ago  . Coronary atherosclerosis of native coronary artery    Multivessel, patent grafts 2007  . Diabetes mellitus, type 2 (HCC)   . Essential hypertension, benign   . Hyperlipidemia   . Hypothyroidism   . Hypothyroidism   . Ischemic cardiomyopathy    Ejection Fraction of 25% improved to 45-50% following biventicular pacing, Status post biventricular dual-chamber ICD - [employer]  . Myocardial infarction (HCC)   . PVD (peripheral vascular disease) (HCC)   . Ulcer    Past Surgical History:  Procedure Laterality Date  . ABDOMINAL AORTAGRAM N/A 08/04/2013   Procedure: ABDOMINAL Ronny Flurry;  Surgeon: Sherren Kerns, MD;  Location: [facility] CATH LAB;  Service: Cardiovascular;  Laterality: N/A;  . APPENDECTOMY    . BI-VENTRICULAR PACEMAKER INSERTION N/A 11/20/2013   Procedure: BI-VENTRICULAR PACEMAKER INSERTION (CRT-P);  Surgeon: Marinus Maw, MD;  Location: [facility] CATH LAB;  Service: Cardiovascular;  Laterality: N/A;  . Bilateral tubal ligation    . CARDIAC DEFIBRILLATOR PLACEMENT     [employer] [place]  . CESAREAN SECTION    . CORONARY ARTERY BYPASS GRAFT  2001   [facility] - [place] to LAD, SVG to ramus, SVG to OM/PLB, SVG to RCA   . FLEXIBLE SIGMOIDOSCOPY  07/19/1998   Dr. Karilyn Cota  . TONSILLECTOMY     Social History   Social History  . Marital status: Divorced    Spouse name: N/A  . Number of children: 1  . Years of education: N/A   Occupational History  . Full Time: [employer]   . Disabled   .  Not Employed   Social History Main Topics  . Smoking status: Former Smoker    Quit date: 09/16/1991  . Smokeless tobacco: Never Used  . Alcohol use No  . Drug use: No  . Sexual  activity: No   Other Topics Concern  . None   Social History Narrative  . None   Outpatient Encounter Prescriptions as of 11/08/2015  Medication Sig  . ALPRAZolam (XANAX) 1 MG tablet TAKE 1/2-1 TABLET 2 TIMES A DAY AS NEEDED FOR ANXIETY  . B-D ULTRAFINE III SHORT PEN 31G X 8 MM MISC USE 1 DAILY AT BEDTIME.  Marland Kitchen Blood Glucose Monitoring Suppl (ACCU-CHEK AVIVA) device Use as instructed  . carvedilol (COREG) 25 MG tablet TAKE ONE TABLET TWICE DAILY WITH A MEAL  . Cholecalciferol (VITAMIN D) 400 UNITS capsule Take 400 Units by mouth daily.  Marland Kitchen ELIQUIS 5 MG TABS tablet TAKE ONE TABLET TWICE DAILY  . enalapril (VASOTEC) 10 MG tablet TAKE ONE TABLET TWICE DAILY  . furosemide (LASIX) 40 MG tablet TAKE ONE TABLET TWICE DAILY  . furosemide (LASIX) 40 MG tablet TAKE ONE TABLET TWICE DAILY  . glucose blood (ACCU-CHEK AVIVA) test strip Use to test glucose 4 times a day  . insulin aspart (NOVOLOG) 100 UNIT/ML injection Inject 20-26 Units into the skin 3 (three) times daily with meals.  . insulin glargine (LANTUS) 100 UNIT/ML injection Inject 0.8 mLs (80 Units total) into the skin at bedtime.  . Insulin Syringe-Needle U-100 (B-D INS SYR ULTRAFINE 1CC/30G) 30G X 1/2" 1 ML MISC Use to  inject insulin 4 times a day  . multivitamin (THERAGRAN) per tablet Take 1 tablet by mouth daily.    . Omega-3 Fatty Acids (FISH OIL) 1000 MG CAPS Take 1,000 mg by mouth daily.  Marland Kitchen OVER THE COUNTER MEDICATION Take 1 tablet by mouth daily. Phyomega vitamin.  . potassium chloride SA (K-DUR,KLOR-CON) 20 MEQ tablet TAKE ONE TABLET TWICE DAILY  . SYNTHROID 125 MCG tablet TAKE ONE (1) TABLET BY MOUTH EVERY DAY  . traMADol (ULTRAM) 50 MG tablet TAKE ONE (1) TABLET EACH DAY AS NEEDED FOR MODERATE PAIN  . VICTOZA 18 MG/3ML SOPN INJECT 0.3 (1.8 MG TOTAL) INTO THE SKIN OONCE DAILY  . vitamin E 600 UNIT capsule Take 600 Units by mouth daily.     No facility-administered encounter medications on file as of 11/08/2015.     ALLERGIES: Allergies  Allergen Reactions  . Avandia [Rosiglitazone] Other (See Comments)    CHF  . Celebrex [Celecoxib] Other (See Comments)    Congestive heart failure  . Tape     Left rash per patient   VACCINATION STATUS: Immunization History  Administered Date(s) Administered  . Influenza,inj,Quad PF,36+ Mos 01/10/2013, 01/01/2014, 12/13/2014  . Influenza-Unspecified 01/11/2012  . Pneumococcal Conjugate-13 11/13/2013  . Pneumococcal Polysaccharide-23 08/02/2012  . Zoster 11/25/2009    Diabetes  She presents for her follow-up diabetic visit. She has type 2 diabetes mellitus. Onset time: She was diagnosed at approximate age of 34 years. Her disease course has been stable. There are no hypoglycemic associated symptoms. Pertinent negatives for hypoglycemia include no confusion, headaches, pallor or seizures. There are no diabetic associated symptoms. Pertinent negatives for diabetes include no chest pain, no polydipsia, no polyphagia and no polyuria. There are no hypoglycemic complications. Symptoms are stable. Diabetic complications include heart disease. Risk factors for coronary artery disease include diabetes mellitus, dyslipidemia, hypertension, obesity, sedentary lifestyle and tobacco exposure. She is compliant with treatment most of the time. Her weight is stable. She is following a generally unhealthy diet. She has had a previous visit with a dietitian. Her breakfast blood glucose range is generally 140-180 mg/dl. Her lunch blood glucose range is generally 140-180 mg/dl. Her dinner blood glucose range is generally 140-180 mg/dl. Her overall blood glucose range is 140-180 mg/dl. An ACE inhibitor/angiotensin II receptor blocker is being taken.  Hyperlipidemia  This is a chronic problem. The current episode started more than 1 year ago. Pertinent negatives include no chest pain, myalgias or shortness of breath. Current antihyperlipidemic treatment includes statins. Risk factors for  coronary artery disease include dyslipidemia, diabetes mellitus, hypertension and a sedentary lifestyle.  Hypertension  This is a chronic problem. The current episode started more than 1 year ago. Pertinent negatives include no chest pain, headaches, palpitations or shortness of breath. Past treatments include ACE inhibitors. Compliance problems include diet.  Hypertensive end-organ damage includes CAD/MI and a thyroid problem.  Thyroid Problem  Presents for follow-up visit. Patient reports no cold intolerance, diarrhea, heat intolerance or palpitations. The symptoms have been improving. Past treatments include levothyroxine. Her past medical history is significant for hyperlipidemia.     Review of Systems  Constitutional: Negative for chills, fever and unexpected weight change.  HENT: Negative for trouble swallowing and voice change.   Eyes: Negative for visual disturbance.  Respiratory: Negative for cough, shortness of breath and wheezing.   Cardiovascular: Negative for chest pain, palpitations and leg swelling.  Gastrointestinal: Negative for diarrhea, nausea and vomiting.  Endocrine: Negative for cold intolerance, heat intolerance, polydipsia, polyphagia and polyuria.  Musculoskeletal: Negative for arthralgias and myalgias.  Skin: Negative for color change, pallor, rash and wound.  Neurological: Negative for seizures and headaches.  Psychiatric/Behavioral: Negative for confusion and suicidal ideas.    Objective:    BP (!) 157/74   Pulse 80   Ht  (1.702 m)   Wt 242 lb (109.8 kg)   BMI 37.90 kg/m   Wt Readings from Last 3 Encounters:  11/08/15 242 lb (109.8 kg)  10/24/15 239 lb (108.4 kg)  08/08/15 239 lb (108.4 kg)    Physical Exam  Constitutional: She is oriented to person, place, and time. She appears well-developed.  HENT:  Head: Normocephalic and atraumatic.  Eyes: EOM are normal.  Neck: Normal range of motion. Neck supple. No tracheal deviation present. No  thyromegaly present.  Cardiovascular: Normal rate and regular rhythm.   Pulmonary/Chest: Effort normal and breath sounds normal.  Abdominal: Soft. Bowel sounds are normal. There is no tenderness. There is no guarding.  Musculoskeletal: Normal range of motion. She exhibits no edema.  Neurological: She is alert and oriented to person, place, and time. She has normal reflexes. No cranial nerve deficit. Coordination normal.  Skin: Skin is warm and dry. No rash noted. No erythema. No pallor.  Psychiatric: She has a normal mood and affect. Judgment normal.     CMP ( most recent) CMP     Component Value Date/Time   NA 137 10/29/2015 0840   K 4.8 10/29/2015 0840   CL 102 10/29/2015 0840   CO2 27 10/29/2015 0840   GLUCOSE 210 (H) 10/29/2015 0840   BUN 20 10/29/2015 0840   CREATININE 0.83 10/29/2015 0840   CALCIUM 9.3 10/29/2015 0840   PROT 7.1 10/29/2015 0840   ALBUMIN 4.1 10/29/2015 0840   AST 27 10/29/2015 0840   ALT 27 10/29/2015 0840   ALKPHOS 48 10/29/2015 0840   BILITOT 0.7 10/29/2015 0840   GFRNONAA 73 10/29/2015 0840   GFRAA 84 10/29/2015 0840     Diabetic Labs (most recent): Lab Results  Component Value Date   HGBA1C 8.0 (H) 10/29/2015   HGBA1C 7.8 (H) 08/02/2015   HGBA1C 7.5 (H) 06/24/2009     Lipid Panel ( most recent) Lipid Panel     Component Value Date/Time   CHOL 162 10/29/2015 0840   TRIG 212 (H) 10/29/2015 0840   HDL 29 (L) 10/29/2015 0840   CHOLHDL 5.6 (H) 10/29/2015 0840   VLDL 42 (H) 10/29/2015 0840   LDLCALC 91 10/29/2015 0840   LDLDIRECT 60.5 07/31/2008 1508     Assessment & Plan:   1. Type 2 diabetes mellitus with vascular disease (HCC) -Her diabetes is  complicated by coronary artery disease and patient remains at a high risk for more acute and chronic complications of diabetes which include CAD, CVA, CKD, retinopathy, and neuropathy. These are all discussed in detail with the patient.  Patient came with near target glucose profile, and   recent A1c of 8 %.  Glucose logs and insulin administration records pertaining to this visit,  to be scanned into patient's records.  Recent labs reviewed.   - I have re-counseled the patient on diet management and weight loss  by adopting a carbohydrate restricted / protein rich  Diet.  - Suggestion is made for patient to avoid simple carbohydrates   from their diet including Cakes , Desserts, Ice Cream,  Soda (  diet and regular) , Sweet Tea , Candies,  Chips, Cookies, Artificial Sweeteners,   and "Sugar-free" Products .  This will help patient to have stable blood glucose profile and potentially avoid unintended  Weight gain.  - Patient is advised to stick to a routine mealtimes to eat 3 meals  a day and avoid unnecessary snacks ( to snack only to correct hypoglycemia).  - The patient  has been  scheduled with Norm Salt, RDN, CDE for individualized DM education.  - I have approached patient with the following individualized plan to manage diabetes and patient agrees.  - I will continue with basal insulin Lantus 80 units QHS, and prandial insulin NovoLog 20 units TIDAC for pre-meal BG readings of 90-150mg /dl, plus patient specific correction dose of rapid acting insulin  for unexpected hyperglycemia above 150mg /dl, associated with strict monitoring of glucose  AC and HS. - Patient is warned not to take insulin without proper monitoring per orders. -Adjustment parameters are given for hypo and hyperglycemia in writing. -Patient is encouraged to call clinic for blood glucose levels less than 70 or above 300 mg /dl. - I will continue Victoza 1.8 mg subcutaneously daily, therapeutically suitable for patient.  - Patient specific target  for A1c; LDL, HDL, Triglycerides, and  Waist Circumference were discussed in detail.  2) BP/HTN: Controlled. Continue current medications including ACEI/ARB. 3) Lipids/HPL:  continue   simvastatin.  4)  Weight/Diet: CDE consult in progress, exercise, and  carbohydrates information provided. 5) hypothyroidism:  I will continue  levothyroxine 125 g by mouth every morning.  - We discussed about correct intake of levothyroxine, at fasting, with water, separated by at least 30 minutes from breakfast, and separated by more than 4 hours from calcium, iron, multivitamins, acid reflux medications (PPIs). -Patient is made aware of the fact that thyroid hormone replacement is needed for life, dose to be adjusted by periodic monitoring of thyroid function tests.  6) Chronic Care/Health Maintenance:  -Patient is on ACEI/ARB and Statin medications and encouraged to continue to follow up with Ophthalmology, Podiatrist at least yearly or according to recommendations, and advised to  stay away from smoking. I have recommended yearly flu vaccine and pneumonia vaccination at least every 5 years; moderate intensity exercise for up to 150 minutes weekly; and  sleep for at least 7 hours a day.  - 25 minutes of time was spent on the care of this patient , 50% of which was applied for counseling on diabetes complications and their preventions.  - I advised patient to maintain close follow up with Lubertha South, MD for primary care needs.  Patient is asked to bring meter and  blood glucose logs during their next visit.   Follow up plan: -Return in about 3 months (around 02/08/2016) for follow up with pre-visit labs, meter, and logs.  Marquis Lunch, MD Phone: [phone]  Fax: [phone]   11/08/2015, 2:43 PM

## 2015-11-11 ENCOUNTER — Other Ambulatory Visit: Payer: Self-pay | Admitting: "Endocrinology

## 2015-11-11 ENCOUNTER — Encounter: Payer: Self-pay | Admitting: Cardiology

## 2015-11-14 LAB — CUP PACEART REMOTE DEVICE CHECK
Battery Remaining Longevity: 120 mo
Brady Statistic RA Percent Paced: 0 %
Brady Statistic RV Percent Paced: 96 %
Implantable Lead Implant Date: 20090202
Implantable Lead Implant Date: 20090202
Implantable Lead Implant Date: 20090202
Implantable Lead Location: 753858
Implantable Lead Location: 753859
Implantable Lead Model: 4135
Implantable Lead Serial Number: 158281
Implantable Lead Serial Number: 28377340
Lead Channel Impedance Value: 646 Ohm
Lead Channel Impedance Value: 816 Ohm
Lead Channel Setting Pacing Amplitude: 1.5 V
Lead Channel Setting Pacing Pulse Width: 0.5 ms
Lead Channel Setting Sensing Sensitivity: 2.5 mV
MDC IDC LEAD LOCATION: 753860
MDC IDC LEAD MODEL: 157
MDC IDC LEAD MODEL: 4554
MDC IDC LEAD SERIAL: 138236
MDC IDC MSMT BATTERY REMAINING PERCENTAGE: 100 %
MDC IDC MSMT LEADCHNL RV IMPEDANCE VALUE: 632 Ohm
MDC IDC PG SERIAL: 118262
MDC IDC SESS DTM: 20170727164500
MDC IDC SET LEADCHNL LV SENSING SENSITIVITY: 2.5 mV
MDC IDC SET LEADCHNL RV PACING AMPLITUDE: 2.4 V
MDC IDC SET LEADCHNL RV PACING PULSEWIDTH: 0.4 ms

## 2015-12-10 ENCOUNTER — Other Ambulatory Visit: Payer: Self-pay | Admitting: "Endocrinology

## 2015-12-10 ENCOUNTER — Other Ambulatory Visit: Payer: Self-pay | Admitting: Internal Medicine

## 2015-12-18 ENCOUNTER — Ambulatory Visit (INDEPENDENT_AMBULATORY_CARE_PROVIDER_SITE_OTHER): Payer: Medicare Other | Admitting: Family Medicine

## 2015-12-18 ENCOUNTER — Encounter: Payer: Self-pay | Admitting: Family Medicine

## 2015-12-18 VITALS — BP 116/74 | Ht 67.0 in | Wt 242.4 lb

## 2015-12-18 DIAGNOSIS — L97509 Non-pressure chronic ulcer of other part of unspecified foot with unspecified severity: Secondary | ICD-10-CM

## 2015-12-18 DIAGNOSIS — E038 Other specified hypothyroidism: Secondary | ICD-10-CM

## 2015-12-18 DIAGNOSIS — G629 Polyneuropathy, unspecified: Secondary | ICD-10-CM

## 2015-12-18 DIAGNOSIS — E11621 Type 2 diabetes mellitus with foot ulcer: Secondary | ICD-10-CM | POA: Diagnosis not present

## 2015-12-18 DIAGNOSIS — Z23 Encounter for immunization: Secondary | ICD-10-CM | POA: Diagnosis not present

## 2015-12-18 DIAGNOSIS — I255 Ischemic cardiomyopathy: Secondary | ICD-10-CM

## 2015-12-18 DIAGNOSIS — E13610 Other specified diabetes mellitus with diabetic neuropathic arthropathy: Secondary | ICD-10-CM | POA: Insufficient documentation

## 2015-12-18 DIAGNOSIS — E1159 Type 2 diabetes mellitus with other circulatory complications: Secondary | ICD-10-CM | POA: Diagnosis not present

## 2015-12-18 DIAGNOSIS — M869 Osteomyelitis, unspecified: Secondary | ICD-10-CM

## 2015-12-18 DIAGNOSIS — E1169 Type 2 diabetes mellitus with other specified complication: Secondary | ICD-10-CM

## 2015-12-18 MED ORDER — ALPRAZOLAM 1 MG PO TABS: ORAL_TABLET | ORAL | 5 refills | Status: DC

## 2015-12-18 NOTE — Progress Notes (Signed)
   Subjective:    Patient ID: [name] [name], female    DOB: 1945/06/06, 70 y.o.   MRN: [id number] Patient arrives office with numerous concerns. Hypertension  This is a chronic problem. The current episode started more than 1 year ago. There are no compliance problems.   Blood pressure medicine and blood pressure levels reviewed today with patient. Compliant with blood pressure medicine. States does not miss a dose. No obvious side effects. Blood pressure generally good when checked elsewhere. Watching salt intake.  Patient gives a history of Charcot's disease of the foot. In the past she has had diabetic foot ulcer with osteomyelitis. 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 Notes pain in the foot no obvious ulcerations  States absolutely needs Xanax at night in order to sleep. Long-standing use this no obvious side effects. Next  Followed by Dr. Fransico HimNida for diabetes BP generally 168 to 172   Review of Systems No headache, no major weight loss or weight gain, no chest pain no back pain abdominal pain no change in bowel habits complete ROS otherwise negative     Objective:   Physical Exam  Alert vital stable blood pressure elevated obesity present no acute distress HEENT normal. Lungs clear. Heart regular in rhythm. Ankles without edema      Assessment & Plan:  Impression #1 Charcot's disease of the ankle-foot I have advised this patient very solidly in the past that she absolutely needs an ongoing association with a podiatrist. Today she mentions that she no longer has a podiatrist and has not had one for some time. #2 hypertension suboptimal control blood pressure medicine prescribed by cardiologist #3 insomnia ongoing need for nighttime meds #4 type 2 diabetes. We discussed followed by Dr. Fransico HimNida plan recommend flu shot. Diet exercise discussed. Our medications refilled. Long discussion held. Patient expressed understanding when I told her she absolutely had to have a podiatrist long-term,  podiatry referral

## 2015-12-23 ENCOUNTER — Other Ambulatory Visit: Payer: Self-pay | Admitting: "Endocrinology

## 2015-12-27 ENCOUNTER — Encounter: Payer: Self-pay | Admitting: Family Medicine

## 2016-01-06 ENCOUNTER — Ambulatory Visit (INDEPENDENT_AMBULATORY_CARE_PROVIDER_SITE_OTHER): Payer: Medicare Other | Admitting: Podiatry

## 2016-01-06 ENCOUNTER — Ambulatory Visit: Payer: Medicare Other

## 2016-01-06 ENCOUNTER — Encounter: Payer: Self-pay | Admitting: Podiatry

## 2016-01-06 VITALS — BP 134/64 | HR 70 | Resp 16 | Ht 67.0 in | Wt 239.0 lb

## 2016-01-06 DIAGNOSIS — M79671 Pain in right foot: Secondary | ICD-10-CM | POA: Diagnosis not present

## 2016-01-06 DIAGNOSIS — M6789 Other specified disorders of synovium and tendon, multiple sites: Secondary | ICD-10-CM

## 2016-01-06 DIAGNOSIS — M79672 Pain in left foot: Secondary | ICD-10-CM | POA: Diagnosis not present

## 2016-01-06 DIAGNOSIS — L97401 Non-pressure chronic ulcer of unspecified heel and midfoot limited to breakdown of skin: Secondary | ICD-10-CM

## 2016-01-06 DIAGNOSIS — E11610 Type 2 diabetes mellitus with diabetic neuropathic arthropathy: Secondary | ICD-10-CM

## 2016-01-06 DIAGNOSIS — E1140 Type 2 diabetes mellitus with diabetic neuropathy, unspecified: Secondary | ICD-10-CM

## 2016-01-06 DIAGNOSIS — I255 Ischemic cardiomyopathy: Secondary | ICD-10-CM | POA: Diagnosis not present

## 2016-01-06 DIAGNOSIS — E1149 Type 2 diabetes mellitus with other diabetic neurological complication: Secondary | ICD-10-CM | POA: Diagnosis not present

## 2016-01-06 DIAGNOSIS — Q828 Other specified congenital malformations of skin: Secondary | ICD-10-CM

## 2016-01-06 DIAGNOSIS — M76829 Posterior tibial tendinitis, unspecified leg: Secondary | ICD-10-CM

## 2016-01-06 NOTE — Progress Notes (Signed)
   Subjective:    Patient ID: [name] [name], female    DOB: 03-24-1946, 70 y.o.   MRN: [id number]  HPI  Chief Complaint  Patient presents with  . Diabetes    Pt states "nothing is really wrong.I just need a podiatrist because I have diabetes."       Review of Systems  Musculoskeletal: Positive for gait problem and myalgias.  All other systems reviewed and are negative.      Objective:   Physical Exam        Assessment & Plan:

## 2016-01-08 NOTE — Progress Notes (Signed)
Subjective:     Patient ID: [name] [name], female   DOB: [birth year], 70 y.o.   MRN: [id number]  HPI patient presents stating that she's had previous ulceration and amputation and has lesion on the outside of the left foot that she needs checked and keratotic lesion hallux right and has had infection that did require irritation in the past   Review of Systems  All other systems reviewed and are negative.      Objective:   Physical Exam  Constitutional: She is oriented to person, place, and time.  Musculoskeletal: Normal range of motion.  Neurological: She is oriented to person, place, and time.  Skin: Skin is warm and dry.  Nursing note and vitals reviewed.  I noted vascular status to be diminished with diminished PT DP pulses and I also noticed diminished sharp dull and vibratory bilateral. Patient has a small keratotic lesion on the outside left foot where there was a previous ulcer that is healed but it did take extensive period of time and there is been an amputation second digit left. Patient's found to have keratotic lesion right hallux that's painful when pressed and she does not like to trim it herself due to her past history of ulceration and amputation     Assessment:     At risk diabetic with lesion formation right history of amputation and history of chronic ulceration    Plan:     H&P conditions reviewed and diabetic education rendered to patient with all instructions on inspections. Today I carefully debrided the lesion right with no iatrogenic bleeding applied padding and discussed long-term diabetic shoes and did send a letter to family physician for approval due to history of ulceration neuropathic changes vascular changes and lesion formation

## 2016-01-09 ENCOUNTER — Other Ambulatory Visit: Payer: Self-pay | Admitting: Internal Medicine

## 2016-01-09 ENCOUNTER — Other Ambulatory Visit: Payer: Self-pay | Admitting: "Endocrinology

## 2016-01-10 ENCOUNTER — Other Ambulatory Visit: Payer: Self-pay

## 2016-01-10 NOTE — Progress Notes (Signed)
May ref times s ix mo 

## 2016-01-13 ENCOUNTER — Other Ambulatory Visit: Payer: Medicare Other

## 2016-01-13 MED ORDER — TRAMADOL HCL 50 MG PO TABS: ORAL_TABLET | ORAL | 5 refills | Status: DC

## 2016-01-29 ENCOUNTER — Other Ambulatory Visit: Payer: Self-pay | Admitting: "Endocrinology

## 2016-01-29 DIAGNOSIS — E1159 Type 2 diabetes mellitus with other circulatory complications: Secondary | ICD-10-CM | POA: Diagnosis not present

## 2016-01-29 LAB — COMPLETE METABOLIC PANEL WITH GFR
ALBUMIN: 4 g/dL (ref 3.6–5.1)
ALK PHOS: 44 U/L (ref 33–130)
ALT: 31 U/L — AB (ref 6–29)
AST: 34 U/L (ref 10–35)
BUN: 12 mg/dL (ref 7–25)
CALCIUM: 9.3 mg/dL (ref 8.6–10.4)
CO2: 26 mmol/L (ref 20–31)
CREATININE: 0.66 mg/dL (ref 0.50–0.99)
Chloride: 103 mmol/L (ref 98–110)
GFR, Est African American: >89 mL/min (ref >=60)
GFR, Est Non African American: >89 mL/min (ref >=60)
Glucose, Bld: 211 mg/dL — ABNORMAL HIGH (ref 65–99)
Potassium: 4.4 mmol/L (ref 3.5–5.3)
Sodium: 140 mmol/L (ref 135–146)
Total Bilirubin: 0.7 mg/dL (ref 0.2–1.2)
Total Protein: 7.2 g/dL (ref 6.1–8.1)

## 2016-01-29 LAB — CBC WITH DIFFERENTIAL/PLATELET
BASOS ABS: 53 {cells}/uL (ref 0–200)
Basophils Relative: 1 %
EOS ABS: 212 {cells}/uL (ref 15–500)
EOS PCT: 4 %
HCT: 40.8 % (ref 35.0–45.0)
HEMOGLOBIN: 12.9 g/dL (ref 11.7–15.5)
Lymphocytes Relative: 30 %
Lymphs Abs: 1590 cells/uL (ref 850–3900)
MCH: 29.3 pg (ref 27.0–33.0)
MCHC: 31.6 g/dL — AB (ref 32.0–36.0)
MCV: 92.7 fL (ref 80.0–100.0)
MONOS PCT: 7 %
MPV: 9.7 fL (ref 7.5–12.5)
Monocytes Absolute: 371 cells/uL (ref 200–950)
NEUTROS PCT: 58 %
Neutro Abs: 3074 cells/uL (ref 1500–7800)
PLATELETS: 290 10*3/uL (ref 140–400)
RBC: 4.4 MIL/uL (ref 3.80–5.10)
RDW: 13.3 % (ref 11.0–15.0)
WBC: 5.3 10*3/uL (ref 3.8–10.8)

## 2016-01-30 LAB — HEMOGLOBIN A1C
HEMOGLOBIN A1C: 7.4 % — AB (ref <5.7)
Mean Plasma Glucose: 166 mg/dL

## 2016-02-06 ENCOUNTER — Encounter: Payer: Self-pay | Admitting: "Endocrinology

## 2016-02-06 ENCOUNTER — Ambulatory Visit (INDEPENDENT_AMBULATORY_CARE_PROVIDER_SITE_OTHER): Payer: Medicare Other | Admitting: "Endocrinology

## 2016-02-06 VITALS — BP 150/77 | HR 77 | Ht 67.0 in | Wt 242.0 lb

## 2016-02-06 DIAGNOSIS — E038 Other specified hypothyroidism: Secondary | ICD-10-CM | POA: Diagnosis not present

## 2016-02-06 DIAGNOSIS — I255 Ischemic cardiomyopathy: Secondary | ICD-10-CM

## 2016-02-06 DIAGNOSIS — E1159 Type 2 diabetes mellitus with other circulatory complications: Secondary | ICD-10-CM

## 2016-02-06 DIAGNOSIS — I10 Essential (primary) hypertension: Secondary | ICD-10-CM | POA: Diagnosis not present

## 2016-02-06 DIAGNOSIS — E782 Mixed hyperlipidemia: Secondary | ICD-10-CM

## 2016-02-06 MED ORDER — INSULIN DETEMIR 100 UNIT/ML FLEXPEN: 80.0000 [IU] | PEN_INJECTOR | Freq: Every day | SUBCUTANEOUS | 2 refills | Status: DC

## 2016-02-06 NOTE — Progress Notes (Signed)
Subjective:    Patient ID: [name] [name], female    DOB: Nov 28, 1945, PCP Lubertha South, MD   Past Medical History:  Diagnosis Date  . Atrial fibrillation (HCC)   . Cardiac defibrillator in place   . Chronic systolic heart failure (HCC)   . Colon polyps    20 yrs ago  . Coronary atherosclerosis of native coronary artery    Multivessel, patent grafts 2007  . Diabetes mellitus, type 2 (HCC)   . Essential hypertension, benign   . Hyperlipidemia   . Hypothyroidism   . Hypothyroidism   . Ischemic cardiomyopathy    Ejection Fraction of 25% improved to 45-50% following biventicular pacing, Status post biventricular dual-chamber ICD - [employer]  . Myocardial infarction   . PVD (peripheral vascular disease) (HCC)   . Ulcer [facility])    Past Surgical History:  Procedure Laterality Date  . ABDOMINAL AORTAGRAM N/A 08/04/2013   Procedure: ABDOMINAL Ronny Flurry;  Surgeon: Sherren Kerns, MD;  Location: [facility] CATH LAB;  Service: Cardiovascular;  Laterality: N/A;  . APPENDECTOMY    . BI-VENTRICULAR PACEMAKER INSERTION N/A 11/20/2013   Procedure: BI-VENTRICULAR PACEMAKER INSERTION (CRT-P);  Surgeon: Marinus Maw, MD;  Location: [facility] CATH LAB;  Service: Cardiovascular;  Laterality: N/A;  . Bilateral tubal ligation    . CARDIAC DEFIBRILLATOR PLACEMENT     [employer] [place]  . CESAREAN SECTION    . CORONARY ARTERY BYPASS GRAFT  2001   [facility] - LIMA to LAD, SVG to ramus, SVG to OM/PLB, SVG to RCA   . FLEXIBLE SIGMOIDOSCOPY  07/19/1998   Dr. Karilyn Cota  . TONSILLECTOMY     Social History   Social History  . Marital status: Divorced    Spouse name: N/A  . Number of children: 1  . Years of education: N/A   Occupational History  . Full Time: [employer]   . Disabled   .  Not Employed   Social History Main Topics  . Smoking status: Former Smoker    Quit date: 09/16/1991  . Smokeless tobacco: Never Used  . Alcohol use No  . Drug use: No  . Sexual  activity: No   Other Topics Concern  . None   Social History Narrative  . None   Outpatient Encounter Prescriptions as of 02/06/2016  Medication Sig  . ALPRAZolam (XANAX) 1 MG tablet TAKE 1/2-1 TABLET 2 TIMES A DAY AS NEEDED FOR ANXIETY  . B-D INS SYR ULTRAFINE 1CC/30G 30G X 1/2" 1 ML MISC USE TO INJECT INSULIN 4 TIMES A DAY  . B-D ULTRAFINE III SHORT PEN 31G X 8 MM MISC USE 1 DAILY AT BEDTIME.  Marland Kitchen Blood Glucose Monitoring Suppl (ACCU-CHEK AVIVA) device Use as instructed  . carvedilol (COREG) 25 MG tablet TAKE ONE TABLET TWICE DAILY WITH A MEAL  . Cholecalciferol (VITAMIN D) 400 UNITS capsule Take 400 Units by mouth daily.  Marland Kitchen ELIQUIS 5 MG TABS tablet TAKE ONE TABLET TWICE DAILY  . enalapril (VASOTEC) 10 MG tablet TAKE ONE TABLET TWICE DAILY  . furosemide (LASIX) 40 MG tablet TAKE ONE TABLET TWICE DAILY  . glucose blood (ACCU-CHEK AVIVA) test strip Use to test glucose 4 times a day  . Insulin Detemir (LEVEMIR FLEXTOUCH) 100 UNIT/ML Pen Inject 80 Units into the skin daily at 10 pm.  . multivitamin (THERAGRAN) per tablet Take 1 tablet by mouth daily.    Marland Kitchen NOVOLOG 100 UNIT/ML injection INJECT 20-26 UNITS INTO THE SKIN 3 TIMESA DAY  WITH MEALS  . Omega-3 Fatty Acids (FISH OIL) 1000 MG CAPS Take 1,000 mg by mouth daily.  Marland Kitchen OVER THE COUNTER MEDICATION Take 1 tablet by mouth daily. Phyomega vitamin.  . potassium chloride SA (K-DUR,KLOR-CON) 20 MEQ tablet TAKE ONE TABLET TWICE DAILY  . SYNTHROID 125 MCG tablet TAKE ONE (1) TABLET BY MOUTH EVERY DAY  . traMADol (ULTRAM) 50 MG tablet TAKE ONE (1) TABLET EACH DAY AS NEEDED FOR MODERATE PAIN  . VICTOZA 18 MG/3ML SOPN INJECT 0.3 (1.8MG  TOTAL) INTO THE SKIN ONCE DAILY  . vitamin E 600 UNIT capsule Take 600 Units by mouth daily.    . [DISCONTINUED] insulin glargine (LANTUS) 100 UNIT/ML injection Inject 0.8 mLs (80 Units total) into the skin at bedtime.   No facility-administered encounter medications on file as of 02/06/2016.    ALLERGIES: Allergies   Allergen Reactions  . Avandia [Rosiglitazone] Other (See Comments)    CHF  . Celebrex [Celecoxib] Other (See Comments)    Congestive heart failure  . Tape     Left rash per patient   VACCINATION STATUS: Immunization History  Administered Date(s) Administered  . Influenza,inj,Quad PF,36+ Mos 01/10/2013, 01/01/2014, 12/13/2014, 12/18/2015  . Influenza-Unspecified 01/11/2012  . Pneumococcal Conjugate-13 11/13/2013  . Pneumococcal Polysaccharide-23 08/02/2012  . Zoster 11/25/2009    Diabetes  She presents for her follow-up diabetic visit. She has type 2 diabetes mellitus. Onset time: She was diagnosed at approximate age of 34 years. Her disease course has been improving. There are no hypoglycemic associated symptoms. Pertinent negatives for hypoglycemia include no confusion, headaches, pallor or seizures. There are no diabetic associated symptoms. Pertinent negatives for diabetes include no chest pain, no polydipsia, no polyphagia and no polyuria. There are no hypoglycemic complications. Symptoms are improving. Diabetic complications include heart disease. Risk factors for coronary artery disease include diabetes mellitus, dyslipidemia, hypertension, obesity, sedentary lifestyle and tobacco exposure. She is compliant with treatment most of the time. Her weight is stable. She is following a generally unhealthy diet. She has had a previous visit with a dietitian. Her breakfast blood glucose range is generally 140-180 mg/dl. Her lunch blood glucose range is generally 140-180 mg/dl. Her dinner blood glucose range is generally 140-180 mg/dl. Her overall blood glucose range is 140-180 mg/dl. An ACE inhibitor/angiotensin II receptor blocker is being taken.  Hyperlipidemia  This is a chronic problem. The current episode started more than 1 year ago. Pertinent negatives include no chest pain, myalgias or shortness of breath. Current antihyperlipidemic treatment includes statins. Risk factors for coronary  artery disease include dyslipidemia, diabetes mellitus, hypertension and a sedentary lifestyle.  Hypertension  This is a chronic problem. The current episode started more than 1 year ago. Pertinent negatives include no chest pain, headaches, palpitations or shortness of breath. Past treatments include ACE inhibitors. Compliance problems include diet.  Hypertensive end-organ damage includes CAD/MI and a thyroid problem.  Thyroid Problem  Presents for follow-up visit. Patient reports no cold intolerance, diarrhea, heat intolerance or palpitations. The symptoms have been improving. Past treatments include levothyroxine. Her past medical history is significant for hyperlipidemia.     Review of Systems  Constitutional: Negative for chills, fever and unexpected weight change.  HENT: Negative for trouble swallowing and voice change.   Eyes: Negative for visual disturbance.  Respiratory: Negative for cough, shortness of breath and wheezing.   Cardiovascular: Negative for chest pain, palpitations and leg swelling.  Gastrointestinal: Negative for diarrhea, nausea and vomiting.  Endocrine: Negative for cold intolerance, heat intolerance, polydipsia, polyphagia  and polyuria.  Musculoskeletal: Negative for arthralgias and myalgias.  Skin: Negative for color change, pallor, rash and wound.  Neurological: Negative for seizures and headaches.  Psychiatric/Behavioral: Negative for confusion and suicidal ideas.    Objective:    BP (!) 150/77   Pulse 77   Ht 5\' 7"  (1.702 m)   Wt 242 lb (109.8 kg)   BMI 37.90 kg/m   Wt Readings from Last 3 Encounters:  02/06/16 242 lb (109.8 kg)  01/06/16 239 lb (108.4 kg)  12/18/15 242 lb 6 oz (109.9 kg)    Physical Exam  Constitutional: She is oriented to person, place, and time. She appears well-developed.  HENT:  Head: Normocephalic and atraumatic.  Eyes: EOM are normal.  Neck: Normal range of motion. Neck supple. No tracheal deviation present. No thyromegaly  present.  Cardiovascular: Normal rate and regular rhythm.   Pulmonary/Chest: Effort normal and breath sounds normal.  Abdominal: Soft. Bowel sounds are normal. There is no tenderness. There is no guarding.  Musculoskeletal: Normal range of motion. She exhibits no edema.  Neurological: She is alert and oriented to person, place, and time. She has normal reflexes. No cranial nerve deficit. Coordination normal.  Skin: Skin is warm and dry. No rash noted. No erythema. No pallor.  Psychiatric: She has a normal mood and affect. Judgment normal.     CMP ( most recent) CMP     Component Value Date/Time   NA 140 01/29/2016 0919   K 4.4 01/29/2016 0919   CL 103 01/29/2016 0919   CO2 26 01/29/2016 0919   GLUCOSE 211 (H) 01/29/2016 0919   BUN 12 01/29/2016 0919   CREATININE 0.66 01/29/2016 0919   CALCIUM 9.3 01/29/2016 0919   PROT 7.2 01/29/2016 0919   ALBUMIN 4.0 01/29/2016 0919   AST 34 01/29/2016 0919   ALT 31 (H) 01/29/2016 0919   ALKPHOS 44 01/29/2016 0919   BILITOT 0.7 01/29/2016 0919   GFRNONAA >89 01/29/2016 0919   GFRAA >89 01/29/2016 0919     Diabetic Labs (most recent): Lab Results  Component Value Date   HGBA1C 7.4 (H) 01/29/2016   HGBA1C 8.0 (H) 10/29/2015   HGBA1C 7.8 (H) 08/02/2015     Lipid Panel ( most recent) Lipid Panel     Component Value Date/Time   CHOL 162 10/29/2015 0840   TRIG 212 (H) 10/29/2015 0840   HDL 29 (L) 10/29/2015 0840   CHOLHDL 5.6 (H) 10/29/2015 0840   VLDL 42 (H) 10/29/2015 0840   LDLCALC 91 10/29/2015 0840   LDLDIRECT 60.5 07/31/2008 1508     Assessment & Plan:   1. Type 2 diabetes mellitus with vascular disease (HCC) -Her diabetes is  complicated by coronary artery disease and patient remains at a high risk for more acute and chronic complications of diabetes which include CAD, CVA, CKD, retinopathy, and neuropathy. These are all discussed in detail with the patient.  Patient came with near target glucose profile, and  recent  A1c of  7.4% improving from 8 %.  Glucose logs and insulin administration records pertaining to this visit,  to be scanned into patient's records.  Recent labs reviewed.   - I have re-counseled the patient on diet management and weight loss  by adopting a carbohydrate restricted / protein rich  Diet.  - Suggestion is made for patient to avoid simple carbohydrates   from their diet including Cakes , Desserts, Ice Cream,  Soda (  diet and regular) , Sweet Tea , Candies,  Chips, Cookies, Artificial Sweeteners,   and "Sugar-free" Products .  This will help patient to have stable blood glucose profile and potentially avoid unintended  Weight gain.  - Patient is advised to stick to a routine mealtimes to eat 3 meals  a day and avoid unnecessary snacks ( to snack only to correct hypoglycemia).  - The patient  has been  scheduled with Norm Salt, RDN, CDE for individualized DM education.  - I have approached patient with the following individualized plan to manage diabetes and patient agrees.  - I will continue with basal insulin Lantus ( or Levemir since she says her insurance will switch her from Lantus to Levemir) 80 units QHS, and prandial insulin NovoLog 20 units TIDAC for pre-meal BG readings of 90-150mg /dl, plus patient specific correction dose of rapid acting insulin  for unexpected hyperglycemia above 150mg /dl, associated with strict monitoring of glucose  AC and HS. - Patient is warned not to take insulin without proper monitoring per orders. -Adjustment parameters are given for hypo and hyperglycemia in writing. -Patient is encouraged to call clinic for blood glucose levels less than 70 or above 300 mg /dl. - I will continue Victoza 1.8 mg subcutaneously daily, therapeutically suitable for patient.  - Patient specific target  for A1c; LDL, HDL, Triglycerides, and  Waist Circumference were discussed in detail.  2) BP/HTN: Controlled. Continue current medications including ACEI/ARB. 3)  Lipids/HPL:  continue   simvastatin.  4)  Weight/Diet: CDE consult in progress, exercise, and carbohydrates information provided. 5) hypothyroidism:  I will continue  levothyroxine 125 g by mouth every morning.  - We discussed about correct intake of levothyroxine, at fasting, with water, separated by at least 30 minutes from breakfast, and separated by more than 4 hours from calcium, iron, multivitamins, acid reflux medications (PPIs). -Patient is made aware of the fact that thyroid hormone replacement is needed for life, dose to be adjusted by periodic monitoring of thyroid function tests.  6) Chronic Care/Health Maintenance:  -Patient is on ACEI/ARB and Statin medications and encouraged to continue to follow up with Ophthalmology, Podiatrist at least yearly or according to recommendations, and advised to  stay away from smoking. I have recommended yearly flu vaccine and pneumonia vaccination at least every 5 years; moderate intensity exercise for up to 150 minutes weekly; and  sleep for at least 7 hours a day.  - 25 minutes of time was spent on the care of this patient , 50% of which was applied for counseling on diabetes complications and their preventions.  - I advised patient to maintain close follow up with Lubertha South, MD for primary care needs.  Patient is asked to bring meter and  blood glucose logs during their next visit.   Follow up plan: -Return in about 3 months (around 05/08/2016) for follow up with pre-visit labs, meter, and logs.  Marquis Lunch, MD Phone: [phone]  Fax: [phone]   02/06/2016, 2:29 PM

## 2016-02-06 NOTE — Patient Instructions (Signed)

## 2016-02-11 ENCOUNTER — Other Ambulatory Visit: Payer: Self-pay | Admitting: Cardiology

## 2016-02-17 ENCOUNTER — Other Ambulatory Visit: Payer: Medicare Other

## 2016-02-17 DIAGNOSIS — E11610 Type 2 diabetes mellitus with diabetic neuropathic arthropathy: Secondary | ICD-10-CM | POA: Diagnosis not present

## 2016-02-17 DIAGNOSIS — M2142 Flat foot [pes planus] (acquired), left foot: Secondary | ICD-10-CM

## 2016-02-17 DIAGNOSIS — E1140 Type 2 diabetes mellitus with diabetic neuropathy, unspecified: Secondary | ICD-10-CM | POA: Diagnosis not present

## 2016-02-17 DIAGNOSIS — E1149 Type 2 diabetes mellitus with other diabetic neurological complication: Secondary | ICD-10-CM | POA: Diagnosis not present

## 2016-03-12 ENCOUNTER — Other Ambulatory Visit: Payer: Self-pay | Admitting: "Endocrinology

## 2016-03-16 ENCOUNTER — Other Ambulatory Visit: Payer: Self-pay | Admitting: "Endocrinology

## 2016-04-10 ENCOUNTER — Other Ambulatory Visit: Payer: Self-pay | Admitting: "Endocrinology

## 2016-04-10 ENCOUNTER — Other Ambulatory Visit: Payer: Self-pay | Admitting: *Deleted

## 2016-04-10 MED ORDER — CARVEDILOL 25 MG PO TABS: ORAL_TABLET | ORAL | 3 refills | Status: DC

## 2016-04-20 ENCOUNTER — Emergency Department (HOSPITAL_COMMUNITY)
Admission: EM | Admit: 2016-04-20 | Discharge: 2016-04-20 | Disposition: A | Discharge status: Home / Self Care | Payer: No Typology Code available for payment source | Attending: Dermatology | Admitting: Dermatology

## 2016-04-20 ENCOUNTER — Ambulatory Visit: Payer: Medicare Other | Admitting: Podiatry

## 2016-04-20 ENCOUNTER — Encounter (HOSPITAL_COMMUNITY): Payer: Self-pay | Admitting: Emergency Medicine

## 2016-04-20 ENCOUNTER — Ambulatory Visit: Payer: Medicare Other

## 2016-04-20 DIAGNOSIS — Y92410 Unspecified street and highway as the place of occurrence of the external cause: Secondary | ICD-10-CM | POA: Diagnosis not present

## 2016-04-20 DIAGNOSIS — Z87891 Personal history of nicotine dependence: Secondary | ICD-10-CM | POA: Insufficient documentation

## 2016-04-20 DIAGNOSIS — Y999 Unspecified external cause status: Secondary | ICD-10-CM | POA: Insufficient documentation

## 2016-04-20 DIAGNOSIS — E119 Type 2 diabetes mellitus without complications: Secondary | ICD-10-CM | POA: Insufficient documentation

## 2016-04-20 DIAGNOSIS — I110 Hypertensive heart disease with heart failure: Secondary | ICD-10-CM | POA: Diagnosis not present

## 2016-04-20 DIAGNOSIS — Z5321 Procedure and treatment not carried out due to patient leaving prior to being seen by health care provider: Secondary | ICD-10-CM | POA: Insufficient documentation

## 2016-04-20 DIAGNOSIS — Z794 Long term (current) use of insulin: Secondary | ICD-10-CM | POA: Insufficient documentation

## 2016-04-20 DIAGNOSIS — I5022 Chronic systolic (congestive) heart failure: Secondary | ICD-10-CM | POA: Insufficient documentation

## 2016-04-20 DIAGNOSIS — Y9389 Activity, other specified: Secondary | ICD-10-CM | POA: Insufficient documentation

## 2016-04-20 DIAGNOSIS — E039 Hypothyroidism, unspecified: Secondary | ICD-10-CM | POA: Diagnosis not present

## 2016-04-20 DIAGNOSIS — S0990XA Unspecified injury of head, initial encounter: Secondary | ICD-10-CM | POA: Insufficient documentation

## 2016-04-20 DIAGNOSIS — Z955 Presence of coronary angioplasty implant and graft: Secondary | ICD-10-CM | POA: Insufficient documentation

## 2016-04-20 DIAGNOSIS — Z79899 Other long term (current) drug therapy: Secondary | ICD-10-CM | POA: Insufficient documentation

## 2016-04-20 DIAGNOSIS — I2510 Atherosclerotic heart disease of native coronary artery without angina pectoris: Secondary | ICD-10-CM | POA: Diagnosis not present
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
## 2016-04-23 ENCOUNTER — Ambulatory Visit: Payer: Medicare Other | Admitting: Podiatry

## 2016-04-23 ENCOUNTER — Ambulatory Visit: Payer: Medicare Other | Admitting: *Deleted

## 2016-04-27 ENCOUNTER — Ambulatory Visit (INDEPENDENT_AMBULATORY_CARE_PROVIDER_SITE_OTHER): Payer: Medicare Other | Admitting: *Deleted

## 2016-04-27 DIAGNOSIS — I255 Ischemic cardiomyopathy: Secondary | ICD-10-CM | POA: Diagnosis not present

## 2016-04-27 LAB — CUP PACEART REMOTE DEVICE CHECK
Battery Remaining Longevity: 120 mo
Date Time Interrogation Session: 20180113092100
Implantable Lead Implant Date: 20090202
Implantable Lead Implant Date: 20090202
Implantable Lead Location: 753859
Implantable Lead Location: 753860
Implantable Lead Model: 157
Implantable Lead Model: 4135
Implantable Lead Serial Number: 138236
Implantable Lead Serial Number: 28377340
Implantable Pulse Generator Implant Date: 20150810
Lead Channel Impedance Value: 678 Ohm
Lead Channel Impedance Value: 681 Ohm
Lead Channel Pacing Threshold Pulse Width: 0.5 ms
Lead Channel Setting Pacing Amplitude: 1.5 V
Lead Channel Setting Pacing Pulse Width: 0.4 ms
Lead Channel Setting Sensing Sensitivity: 2.5 mV
MDC IDC LEAD IMPLANT DT: 20090202
MDC IDC LEAD LOCATION: 753858
MDC IDC LEAD SERIAL: 158281
MDC IDC MSMT BATTERY REMAINING PERCENTAGE: 100 %
MDC IDC MSMT LEADCHNL LV IMPEDANCE VALUE: 859 Ohm
MDC IDC MSMT LEADCHNL LV PACING THRESHOLD AMPLITUDE: 0.8 V
MDC IDC SET LEADCHNL LV PACING PULSEWIDTH: 0.5 ms
MDC IDC SET LEADCHNL LV SENSING SENSITIVITY: 2.5 mV
MDC IDC SET LEADCHNL RV PACING AMPLITUDE: 2.4 V
MDC IDC STAT BRADY RA PERCENT PACED: 0 %
MDC IDC STAT BRADY RV PERCENT PACED: 95 %
Pulse Gen Serial Number: 118262

## 2016-04-27 NOTE — Progress Notes (Signed)
Remote pacemaker transmission.   

## 2016-05-06 ENCOUNTER — Encounter: Payer: Self-pay | Admitting: Cardiology

## 2016-05-12 ENCOUNTER — Ambulatory Visit: Payer: Medicare Other | Admitting: "Endocrinology

## 2016-05-12 ENCOUNTER — Other Ambulatory Visit: Payer: Self-pay

## 2016-05-12 MED ORDER — ENALAPRIL MALEATE 10 MG PO TABS: 10.0000 mg | ORAL_TABLET | Freq: Two times a day (BID) | ORAL | 3 refills | Status: DC

## 2016-05-12 NOTE — Telephone Encounter (Signed)
Refilled vasotec per requested fax

## 2016-05-22 ENCOUNTER — Other Ambulatory Visit: Payer: Self-pay | Admitting: "Endocrinology

## 2016-05-22 DIAGNOSIS — E1159 Type 2 diabetes mellitus with other circulatory complications: Secondary | ICD-10-CM | POA: Diagnosis not present

## 2016-05-22 DIAGNOSIS — E038 Other specified hypothyroidism: Secondary | ICD-10-CM | POA: Diagnosis not present

## 2016-05-22 LAB — COMPREHENSIVE METABOLIC PANEL
ALBUMIN: 4.3 g/dL (ref 3.6–5.1)
ALK PHOS: 43 U/L (ref 33–130)
ALT: 26 U/L (ref 6–29)
AST: 32 U/L (ref 10–35)
BUN: 15 mg/dL (ref 7–25)
CHLORIDE: 104 mmol/L (ref 98–110)
CO2: 25 mmol/L (ref 20–31)
Calcium: 9.7 mg/dL (ref 8.6–10.4)
Creat: 0.74 mg/dL (ref 0.50–0.99)
Glucose, Bld: 185 mg/dL — ABNORMAL HIGH (ref 65–99)
POTASSIUM: 4.5 mmol/L (ref 3.5–5.3)
Sodium: 141 mmol/L (ref 135–146)
TOTAL PROTEIN: 7.5 g/dL (ref 6.1–8.1)
Total Bilirubin: 1.3 mg/dL — ABNORMAL HIGH (ref 0.2–1.2)

## 2016-05-22 LAB — TSH: TSH: 0.71 mIU/L

## 2016-05-22 LAB — T4, FREE: Free T4: 1.2 ng/dL (ref 0.8–1.8)

## 2016-05-23 LAB — HEMOGLOBIN A1C
Hgb A1c MFr Bld: 7.6 % — ABNORMAL HIGH (ref <5.7)
Mean Plasma Glucose: 171 mg/dL

## 2016-05-24 NOTE — Progress Notes (Signed)
Cardiology Office Note  Date: 05/25/2016   ID: [name] [name], DOB Sep 30, 1945, MRN [id number]  PCP: Mickie Hillier, MD  Primary Cardiologist: Rozann Lesches, MD   Chief Complaint  Patient presents with  . Cardiomyopathy    History of Present Illness: [name] [name] is a 71 y.o. female last seen in July 2017. She presents for a routine follow-up visit. Reports no angina symptoms, no dizziness or syncope. She continues to manage with her daily ADLs. Does have limitations related to chronic ankle discomfort.  She continues to follow in the device clinic with Dr. Lovena Le, [facility] scientific biventricular pacemaker in place.  Follow-up echocardiogram from July 2017 is outlined below, LVEF 40-45% range at that time. Medications are outlined below. Current cardiac regimen includes Coreg, Eliquis, Vasotec, Lasix, and potassium supplements.  I reviewed the ECG today which shows a ventricular paced rhythm. From the perspective of ischemic heart disease she has had no significant angina symptoms and remains comfortable with observation. She has not undergone recent ischemic testing.  Past Medical History:  Diagnosis Date  . Atrial fibrillation ([place])   . Cardiac defibrillator in place   . Chronic systolic heart failure ([place])   . Colon polyps    20 yrs ago  . Coronary atherosclerosis of native coronary artery    Multivessel, patent grafts 2007  . Diabetes mellitus, type 2 ([place])   . Essential hypertension, benign   . Hyperlipidemia   . Hypothyroidism   . Hypothyroidism   . Ischemic cardiomyopathy    Ejection Fraction of 25% improved to 45-50% following biventicular pacing, Status post biventricular dual-chamber ICD - [employer]  . Myocardial infarction   . PVD (peripheral vascular disease) ([place])   . Ulcer [facility])     Past Surgical History:  Procedure Laterality Date  . ABDOMINAL AORTAGRAM N/A 08/04/2013   Procedure: ABDOMINAL Maxcine Ham;  Surgeon: Elam Dutch,  MD;  Location: [facility] CATH LAB;  Service: Cardiovascular;  Laterality: N/A;  . APPENDECTOMY    . BI-VENTRICULAR PACEMAKER INSERTION N/A 11/20/2013   Procedure: BI-VENTRICULAR PACEMAKER INSERTION (CRT-P);  Surgeon: Evans Lance, MD;  Location: [facility] CATH LAB;  Service: Cardiovascular;  Laterality: N/A;  . Bilateral tubal ligation    . CARDIAC DEFIBRILLATOR PLACEMENT     [employer] [place]  . CESAREAN SECTION    . CORONARY ARTERY BYPASS GRAFT  2001   [facility] - LIMA to LAD, SVG to ramus, SVG to OM/PLB, SVG to RCA   . FLEXIBLE SIGMOIDOSCOPY  07/19/1998   Dr. Laural Golden  . TONSILLECTOMY      Current Outpatient Prescriptions  Medication Sig Dispense Refill  . ALPRAZolam (XANAX) 1 MG tablet TAKE 1/2-1 TABLET 2 TIMES A DAY AS NEEDED FOR ANXIETY 60 tablet 5  . B-D INS SYR ULTRAFINE 1CC/30G 30G X 1/2" 1 ML MISC USE TO INJECT INSULIN 4 TIMES A DAY 150 each 3  . B-D ULTRAFINE III SHORT PEN 31G X 8 MM MISC USE 1 DAILY AT BEDTIME 100 each 3  . Blood Glucose Monitoring Suppl (ACCU-CHEK AVIVA) device Use as instructed 1 each 0  . carvedilol (COREG) 25 MG tablet TAKE ONE TABLET TWICE DAILY WITH A MEAL 60 tablet 3  . Cholecalciferol (VITAMIN D) 400 UNITS capsule Take 400 Units by mouth daily.    Marland Kitchen ELIQUIS 5 MG TABS tablet TAKE ONE TABLET TWICE DAILY 60 tablet 3  . enalapril (VASOTEC) 10 MG tablet Take 1 tablet (10 mg total) by mouth 2 (two) times daily.  180 tablet 3  . furosemide (LASIX) 40 MG tablet TAKE ONE TABLET TWICE DAILY 60 tablet 6  . glucose blood (ACCU-CHEK AVIVA) test strip Use to test glucose 4 times a day 150 each 3  . Insulin Detemir (LEVEMIR FLEXTOUCH) 100 UNIT/ML Pen Inject 80 Units into the skin daily at 10 pm. 10 pen 2  . multivitamin (THERAGRAN) per tablet Take 1 tablet by mouth daily.      Marland Kitchen NOVOLOG 100 UNIT/ML injection INJECT 20-26 UNITS INTO THE SKIN 3 TIMESA DAY WITH MEALS 15 mL 2  . Omega-3 Fatty Acids (FISH OIL) 1000 MG CAPS Take 1,000 mg by mouth daily.    Marland Kitchen OVER THE COUNTER  MEDICATION Take 1 tablet by mouth daily. Phyomega vitamin.    . potassium chloride SA (K-DUR,KLOR-CON) 20 MEQ tablet TAKE ONE TABLET TWICE DAILY 60 tablet 6  . SYNTHROID 125 MCG tablet TAKE ONE TABLET BY MOUTH EVERY DAY. 30 tablet 5  . traMADol (ULTRAM) 50 MG tablet TAKE ONE (1) TABLET EACH DAY AS NEEDED FOR MODERATE PAIN 30 tablet 5  . VICTOZA 18 MG/3ML SOPN INJECT 0.3 ML (1.8 MG TOTAL) INTO THE SKIN ONCE DAILY. 9 mL 2  . vitamin E 600 UNIT capsule Take 600 Units by mouth daily.       No current facility-administered medications for this visit.    Allergies:  Avandia [rosiglitazone]; Celebrex [celecoxib]; and Tape   Social History: The patient  reports that she quit smoking about 24 years ago. She has never used smokeless tobacco. She reports that she does not drink alcohol or use drugs.   ROS:  Please see the history of present illness. Otherwise, complete review of systems is positive for none.  All other systems are reviewed and negative.   Physical Exam: VS:  BP 138/62   Pulse 75   Ht _0  (1.676 m)   Wt 244 lb (110.7 kg)   SpO2 95%   BMI 39.38 kg/m , BMI Body mass index is 39.38 kg/m.  Wt Readings from Last 3 Encounters:  05/25/16 244 lb (110.7 kg)  04/20/16 240 lb (108.9 kg)  02/06/16 242 lb (109.8 kg)    Obese woman in NAD. Using a cane. HEENT: Conjunctiva and lids normal, oropharynx clear.  Neck: Supple, increased girth, no carotid bruits, no thyromegaly.  Lungs: Clear to auscultation, nonlabored breathing at rest.  Cardiac: Regular rate and rhythm, no S3, no pericardial rub.  Abdomen: Soft, nontender, bowel sounds present, no guarding or rebound.  Extremities: Mild lower leg and ankle edema, distal pulses 1-2+. Skin: Warm and dry.  Musculoskeletal: No kyphosis.  Neuropsychiatric: Alert and oriented x3, affect grossly appropriate.  ECG: I personally reviewed the tracing from 01/09/2015 which showed a ventricular paced rhythm.  Recent Labwork: 01/29/2016:  Hemoglobin 12.9; Platelets 290 05/22/2016: ALT 26; AST 32; BUN 15; Creat 0.74; Potassium 4.5; Sodium 141; TSH 0.71   Other Studies Reviewed Today:  Echocardiogram 10/29/2015: Study Conclusions  - Left ventricle: The cavity size was mildly dilated. Wall   thickness was increased in a pattern of mild LVH. Systolic   function was mildly to moderately reduced. The estimated ejection   fraction was in the range of 40% to 45%. Diffuse hypokinesis.   There is akinesis of the mid-apicalanteroseptal myocardium. There   is severe hypokinesis of the basalinferior myocardium. Doppler   parameters are consistent with restrictive physiology, indicative   of decreased left ventricular diastolic compliance and/or   increased left atrial pressure. - Aortic valve:  Moderately calcified annulus. Trileaflet; mildly   calcified leaflets. Moderate thickening and calcification   involving the noncoronary cusp. Noncoronary cusp mobility was   restricted. There was mild stenosis. There was trivial   regurgitation. Mean gradient (S): 10 mm Hg. Peak gradient (S): 18   mm Hg. VTI ratio of LVOT to aortic valve: 0.53. - Mitral valve: Calcified annulus. There was mild regurgitation. - Left atrium: The atrium was moderately dilated. - Right ventricle: Pacer wire or catheter noted in right ventricle.   Systolic function was low normal. - Right atrium: The atrium was at the upper limits of normal in   size. - Tricuspid valve: There was mild regurgitation. - Pulmonary arteries: PA peak pressure: 44 mm Hg (S). - Pericardium, extracardiac: There was no pericardial effusion.  Impressions:  - Mild LV chamber dilatation with mild LVH and LVEF 40-45%. Diffuse   hypokinesis with wall motion abnormalities consistent with   ischemic cardiomyopathy. Restrictive diastolic filling pattern   with increased LV filling pressure. Moderate left atrial   enlargement. MAC with mild mitral regurgitation. Mild calcific   aortic  stenosis with trivial aortic regurgitation. Low normal RV   contraction with device wire present. Mild tricuspid   regurgitation with PASP 44 mmHg.  Assessment and Plan:  1. CAD status post CABG. She had documented patent grafts as of 2007. No recent ischemic testing, but she remains stable on medical therapy and is comfortable with observation for now. ECG shows a paced ventricular rhythm.  2. Paroxysmal atrial fibrillation, continues on Eliquis for stroke prophylaxis. She follows routinely with Dr. Wolfgang Phoenix for lab work.  3. Ischemic cardiomyopathy with last LVEF 40-45% range. Weight is relatively stable. Continue current diuretic regimen. [employer] biventricular pacemaker in place.  4. Essential hypertension, blood pressure control is adequate today. No changes made to current regimen.   Current medicines were reviewed with the patient today.   Orders Placed This Encounter  Procedures  . EKG 12-Lead    Disposition: Follow-up in 6 months.  Signed, Satira Sark, MD, [facility] 05/25/2016 9:30 AM     [facility] [address], [place], [place] [address] Phone: [phone]; Fax: [phone]

## 2016-05-25 ENCOUNTER — Encounter: Payer: Self-pay | Admitting: Podiatry

## 2016-05-25 ENCOUNTER — Ambulatory Visit (INDEPENDENT_AMBULATORY_CARE_PROVIDER_SITE_OTHER): Payer: Medicare Other | Admitting: Cardiology

## 2016-05-25 ENCOUNTER — Ambulatory Visit (INDEPENDENT_AMBULATORY_CARE_PROVIDER_SITE_OTHER): Payer: Medicare Other | Admitting: Podiatry

## 2016-05-25 ENCOUNTER — Encounter: Payer: Self-pay | Admitting: Cardiology

## 2016-05-25 VITALS — BP 138/62 | HR 75 | Ht 66.0 in | Wt 244.0 lb

## 2016-05-25 DIAGNOSIS — B351 Tinea unguium: Secondary | ICD-10-CM | POA: Diagnosis not present

## 2016-05-25 DIAGNOSIS — Q828 Other specified congenital malformations of skin: Secondary | ICD-10-CM

## 2016-05-25 DIAGNOSIS — M79605 Pain in left leg: Secondary | ICD-10-CM

## 2016-05-25 DIAGNOSIS — M79604 Pain in right leg: Secondary | ICD-10-CM | POA: Diagnosis not present

## 2016-05-25 DIAGNOSIS — I10 Essential (primary) hypertension: Secondary | ICD-10-CM | POA: Diagnosis not present

## 2016-05-25 DIAGNOSIS — E1140 Type 2 diabetes mellitus with diabetic neuropathy, unspecified: Secondary | ICD-10-CM | POA: Diagnosis not present

## 2016-05-25 DIAGNOSIS — E113553 Type 2 diabetes mellitus with stable proliferative diabetic retinopathy, bilateral: Secondary | ICD-10-CM | POA: Diagnosis not present

## 2016-05-25 DIAGNOSIS — E1149 Type 2 diabetes mellitus with other diabetic neurological complication: Secondary | ICD-10-CM

## 2016-05-25 DIAGNOSIS — I2510 Atherosclerotic heart disease of native coronary artery without angina pectoris: Secondary | ICD-10-CM | POA: Diagnosis not present

## 2016-05-25 DIAGNOSIS — I255 Ischemic cardiomyopathy: Secondary | ICD-10-CM

## 2016-05-25 DIAGNOSIS — I480 Paroxysmal atrial fibrillation: Secondary | ICD-10-CM

## 2016-05-25 NOTE — Patient Instructions (Signed)
Your physician wants you to follow-up in: 6 months with Dr McDowell You will receive a reminder letter in the mail two months in advance. If you don't receive a letter, please call our office to schedule the follow-up appointment.     Your physician recommends that you continue on your current medications as directed. Please refer to the Current Medication list given to you today.    If you need a refill on your cardiac medications before your next appointment, please call your pharmacy.     Thank you for choosing [facility] !        

## 2016-05-26 NOTE — Progress Notes (Signed)
Subjective:     Patient ID: [name] [name], female   DOB: 04-02-1946, 71 y.o.   MRN: [id number]  HPI long-term diabetic with significant structural deformity of the digits with hammertoe deformity chronic lesion formation and nail disease that she cannot take care of.   Review of Systems     Objective:   Physical Exam Noted to have diminishment of neurological vascular status with diminishment of DTR reflexes DP pulses and sharp dull and vibratory. Digital deformities with rigid contracture of the lesser digits with patient noted to have keratotic lesions of digit 5 bilateral with rotation of the toes digit 2 and also nail disease with thickness 1-5 both feet was history of amputation second digit left    Assessment:     At risk diabetic with mycotic infection lesion formation and history of amputation ulceration    Plan:     H&P all conditions reviewed debridement of lesions nails accomplished with no iatrogenic bleeding and discussed diabetic shoes with patient to be scheduled for diabetic shoes and was casted today

## 2016-06-01 ENCOUNTER — Encounter: Payer: Self-pay | Admitting: "Endocrinology

## 2016-06-01 ENCOUNTER — Ambulatory Visit (INDEPENDENT_AMBULATORY_CARE_PROVIDER_SITE_OTHER): Payer: Medicare Other | Admitting: "Endocrinology

## 2016-06-01 VITALS — BP 125/72 | HR 79 | Ht 67.0 in | Wt 242.0 lb

## 2016-06-01 DIAGNOSIS — E038 Other specified hypothyroidism: Secondary | ICD-10-CM | POA: Diagnosis not present

## 2016-06-01 DIAGNOSIS — E1159 Type 2 diabetes mellitus with other circulatory complications: Secondary | ICD-10-CM | POA: Diagnosis not present

## 2016-06-01 DIAGNOSIS — E782 Mixed hyperlipidemia: Secondary | ICD-10-CM | POA: Diagnosis not present

## 2016-06-01 DIAGNOSIS — I10 Essential (primary) hypertension: Secondary | ICD-10-CM | POA: Diagnosis not present

## 2016-06-01 MED ORDER — INSULIN LISPRO 100 UNIT/ML (KWIKPEN): 20.0000 [IU] | PEN_INJECTOR | Freq: Three times a day (TID) | SUBCUTANEOUS | 2 refills | Status: DC

## 2016-06-01 NOTE — Progress Notes (Signed)
Subjective:    Patient ID: [name] [name], female    DOB: 1945-04-17, PCP Lubertha South, MD   Past Medical History:  Diagnosis Date  . Atrial fibrillation (HCC)   . Cardiac defibrillator in place   . Chronic systolic heart failure (HCC)   . Colon polyps    20 yrs ago  . Coronary atherosclerosis of native coronary artery    Multivessel, patent grafts 2007  . Diabetes mellitus, type 2 (HCC)   . Essential hypertension, benign   . Hyperlipidemia   . Hypothyroidism   . Hypothyroidism   . Ischemic cardiomyopathy    Ejection Fraction of 25% improved to 45-50% following biventicular pacing, Status post biventricular dual-chamber ICD - [employer]  . Myocardial infarction   . PVD (peripheral vascular disease) (HCC)   . Ulcer [facility])    Past Surgical History:  Procedure Laterality Date  . ABDOMINAL AORTAGRAM N/A 08/04/2013   Procedure: ABDOMINAL Ronny Flurry;  Surgeon: Sherren Kerns, MD;  Location: [facility] CATH LAB;  Service: Cardiovascular;  Laterality: N/A;  . APPENDECTOMY    . BI-VENTRICULAR PACEMAKER INSERTION N/A 11/20/2013   Procedure: BI-VENTRICULAR PACEMAKER INSERTION (CRT-P);  Surgeon: Marinus Maw, MD;  Location: [facility] CATH LAB;  Service: Cardiovascular;  Laterality: N/A;  . Bilateral tubal ligation    . CARDIAC DEFIBRILLATOR PLACEMENT     [employer] [place]  . CESAREAN SECTION    . CORONARY ARTERY BYPASS GRAFT  2001   [facility] - [place] to LAD, SVG to ramus, SVG to OM/PLB, SVG to RCA   . FLEXIBLE SIGMOIDOSCOPY  07/19/1998   Dr. Karilyn Cota  . TONSILLECTOMY     Social History   Social History  . Marital status: Divorced    Spouse name: N/A  . Number of children: 1  . Years of education: N/A   Occupational History  . Full Time: [employer]   . Disabled   .  Not Employed   Social History Main Topics  . Smoking status: Former Smoker    Quit date: 09/16/1991  . Smokeless tobacco: Never Used  . Alcohol use No  . Drug use: No  . Sexual  activity: No   Other Topics Concern  . None   Social History Narrative  . None   Outpatient Encounter Prescriptions as of 06/01/2016  Medication Sig  . ALPRAZolam (XANAX) 1 MG tablet TAKE 1/2-1 TABLET 2 TIMES A DAY AS NEEDED FOR ANXIETY  . B-D INS SYR ULTRAFINE 1CC/30G 30G X 1/2" 1 ML MISC USE TO INJECT INSULIN 4 TIMES A DAY  . B-D ULTRAFINE III SHORT PEN 31G X 8 MM MISC USE 1 DAILY AT BEDTIME  . Blood Glucose Monitoring Suppl (ACCU-CHEK AVIVA) device Use as instructed  . carvedilol (COREG) 25 MG tablet TAKE ONE TABLET TWICE DAILY WITH A MEAL  . Cholecalciferol (VITAMIN D) 400 UNITS capsule Take 400 Units by mouth daily.  Marland Kitchen ELIQUIS 5 MG TABS tablet TAKE ONE TABLET TWICE DAILY  . enalapril (VASOTEC) 10 MG tablet Take 1 tablet (10 mg total) by mouth 2 (two) times daily.  . furosemide (LASIX) 40 MG tablet TAKE ONE TABLET TWICE DAILY  . glucose blood (ACCU-CHEK AVIVA) test strip Use to test glucose 4 times a day  . Insulin Detemir (LEVEMIR FLEXTOUCH) 100 UNIT/ML Pen Inject 80 Units into the skin daily at 10 pm.  . insulin lispro (HUMALOG KWIKPEN) 100 UNIT/ML KiwkPen Inject 0.2-0.26 mLs (20-26 Units total) into the skin 3 (three) times daily.  Marland Kitchen  multivitamin (THERAGRAN) per tablet Take 1 tablet by mouth daily.    . Omega-3 Fatty Acids (FISH OIL) 1000 MG CAPS Take 1,000 mg by mouth daily.  Marland Kitchen OVER THE COUNTER MEDICATION Take 1 tablet by mouth daily. Phyomega vitamin.  . potassium chloride SA (K-DUR,KLOR-CON) 20 MEQ tablet TAKE ONE TABLET TWICE DAILY  . SYNTHROID 125 MCG tablet TAKE ONE TABLET BY MOUTH EVERY DAY.  . traMADol (ULTRAM) 50 MG tablet TAKE ONE (1) TABLET EACH DAY AS NEEDED FOR MODERATE PAIN  . VICTOZA 18 MG/3ML SOPN INJECT 0.3 ML (1.8 MG TOTAL) INTO THE SKIN ONCE DAILY.  . vitamin E 600 UNIT capsule Take 600 Units by mouth daily.    . [DISCONTINUED] NOVOLOG 100 UNIT/ML injection INJECT 20-26 UNITS INTO THE SKIN 3 TIMESA DAY WITH MEALS   No facility-administered encounter  medications on file as of 06/01/2016.    ALLERGIES: Allergies  Allergen Reactions  . Avandia [Rosiglitazone] Other (See Comments)    CHF  . Celebrex [Celecoxib] Other (See Comments)    Congestive heart failure  . Tape     Left rash per patient   VACCINATION STATUS: Immunization History  Administered Date(s) Administered  . Influenza,inj,Quad PF,36+ Mos 01/10/2013, 01/01/2014, 12/13/2014, 12/18/2015  . Influenza-Unspecified 01/11/2012  . Pneumococcal Conjugate-13 11/13/2013  . Pneumococcal Polysaccharide-23 08/02/2012  . Zoster 11/25/2009    Diabetes  She presents for her follow-up diabetic visit. She has type 2 diabetes mellitus. Onset time: She was diagnosed at approximate age of 34 years. Her disease course has been stable. There are no hypoglycemic associated symptoms. Pertinent negatives for hypoglycemia include no confusion, headaches, pallor or seizures. There are no diabetic associated symptoms. Pertinent negatives for diabetes include no chest pain, no polydipsia, no polyphagia and no polyuria. There are no hypoglycemic complications. Symptoms are stable. Diabetic complications include heart disease. Risk factors for coronary artery disease include diabetes mellitus, dyslipidemia, hypertension, obesity, sedentary lifestyle and tobacco exposure. She is compliant with treatment most of the time. Her weight is stable. She is following a generally unhealthy diet. She has had a previous visit with a dietitian. Her breakfast blood glucose range is generally 140-180 mg/dl. Her lunch blood glucose range is generally 140-180 mg/dl. Her dinner blood glucose range is generally 140-180 mg/dl. Her overall blood glucose range is 140-180 mg/dl. An ACE inhibitor/angiotensin II receptor blocker is being taken.  Hyperlipidemia  This is a chronic problem. The current episode started more than 1 year ago. Pertinent negatives include no chest pain, myalgias or shortness of breath. Current  antihyperlipidemic treatment includes statins. Risk factors for coronary artery disease include dyslipidemia, diabetes mellitus, hypertension and a sedentary lifestyle.  Hypertension  This is a chronic problem. The current episode started more than 1 year ago. Pertinent negatives include no chest pain, headaches, palpitations or shortness of breath. Past treatments include ACE inhibitors. Compliance problems include diet.  Hypertensive end-organ damage includes CAD/MI. Identifiable causes of hypertension include a thyroid problem.  Thyroid Problem  Presents for follow-up visit. Patient reports no cold intolerance, diarrhea, heat intolerance or palpitations. The symptoms have been improving. Past treatments include levothyroxine. Her past medical history is significant for hyperlipidemia.     Review of Systems  Constitutional: Negative for chills, fever and unexpected weight change.  HENT: Negative for trouble swallowing and voice change.   Eyes: Negative for visual disturbance.  Respiratory: Negative for cough, shortness of breath and wheezing.   Cardiovascular: Negative for chest pain, palpitations and leg swelling.  Gastrointestinal: Negative for diarrhea,  nausea and vomiting.  Endocrine: Negative for cold intolerance, heat intolerance, polydipsia, polyphagia and polyuria.  Musculoskeletal: Negative for arthralgias and myalgias.  Skin: Negative for color change, pallor, rash and wound.  Neurological: Negative for seizures and headaches.  Psychiatric/Behavioral: Negative for confusion and suicidal ideas.    Objective:    BP 125/72   Pulse 79   Ht 5\' 7"  (1.702 m)   Wt 242 lb (109.8 kg)   BMI 37.90 kg/m   Wt Readings from Last 3 Encounters:  06/01/16 242 lb (109.8 kg)  05/25/16 244 lb (110.7 kg)  04/20/16 240 lb (108.9 kg)    Physical Exam  Constitutional: She is oriented to person, place, and time. She appears well-developed.  HENT:  Head: Normocephalic and atraumatic.  Eyes:  EOM are normal.  Neck: Normal range of motion. Neck supple. No tracheal deviation present. No thyromegaly present.  Cardiovascular: Normal rate and regular rhythm.   Pulmonary/Chest: Effort normal and breath sounds normal.  Abdominal: Soft. Bowel sounds are normal. There is no tenderness. There is no guarding.  Musculoskeletal: Normal range of motion. She exhibits no edema.  Neurological: She is alert and oriented to person, place, and time. She has normal reflexes. No cranial nerve deficit. Coordination normal.  Skin: Skin is warm and dry. No rash noted. No erythema. No pallor.  Psychiatric: She has a normal mood and affect. Judgment normal.     CMP ( most recent) CMP     Component Value Date/Time   NA 141 05/22/2016 1023   K 4.5 05/22/2016 1023   CL 104 05/22/2016 1023   CO2 25 05/22/2016 1023   GLUCOSE 185 (H) 05/22/2016 1023   BUN 15 05/22/2016 1023   CREATININE 0.74 05/22/2016 1023   CALCIUM 9.7 05/22/2016 1023   PROT 7.5 05/22/2016 1023   ALBUMIN 4.3 05/22/2016 1023   AST 32 05/22/2016 1023   ALT 26 05/22/2016 1023   ALKPHOS 43 05/22/2016 1023   BILITOT 1.3 (H) 05/22/2016 1023   GFRNONAA >89 01/29/2016 0919   GFRAA >89 01/29/2016 0919     Diabetic Labs (most recent): Lab Results  Component Value Date   HGBA1C 7.6 (H) 05/22/2016   HGBA1C 7.4 (H) 01/29/2016   HGBA1C 8.0 (H) 10/29/2015     Lipid Panel ( most recent) Lipid Panel     Component Value Date/Time   CHOL 162 10/29/2015 0840   TRIG 212 (H) 10/29/2015 0840   HDL 29 (L) 10/29/2015 0840   CHOLHDL 5.6 (H) 10/29/2015 0840   VLDL 42 (H) 10/29/2015 0840   LDLCALC 91 10/29/2015 0840   LDLDIRECT 60.5 07/31/2008 1508     Assessment & Plan:   1. Type 2 diabetes mellitus with vascular disease (HCC) -Her diabetes is  complicated by coronary artery disease and patient remains at a high risk for more acute and chronic complications of diabetes which include CAD, CVA, CKD, retinopathy, and neuropathy. These are  all discussed in detail with the patient.  Patient came with near target glucose profile, and  stable A1c of  7.6% improving from 8 %.  Glucose logs and insulin administration records pertaining to this visit,  to be scanned into patient's records.  Recent labs reviewed.   - I have re-counseled the patient on diet management and weight loss  by adopting a carbohydrate restricted / protein rich  Diet.  - Suggestion is made for patient to avoid simple carbohydrates   from their diet including Cakes , Desserts, Ice Cream,  Soda (  diet and regular) , Sweet Tea , Candies,  Chips, Cookies, Artificial Sweeteners,   and "Sugar-free" Products .  This will help patient to have stable blood glucose profile and potentially avoid unintended  Weight gain.  - Patient is advised to stick to a routine mealtimes to eat 3 meals  a day and avoid unnecessary snacks ( to snack only to correct hypoglycemia).  - The patient  has been  scheduled with Norm SaltPenny Crumpton, RDN, CDE for individualized DM education.  - I have approached patient with the following individualized plan to manage diabetes and patient agrees.  - I will continue with basal insulin  Levemir 80 units QHS, and prandial insulin Humalog 20 units TIDAC for pre-meal BG readings of 90-150mg /dl, plus patient specific correction dose of rapid acting insulin  for unexpected hyperglycemia above 150mg /dl, associated with strict monitoring of glucose  AC and HS. - Patient is warned not to take insulin without proper monitoring per orders. -Adjustment parameters are given for hypo and hyperglycemia in writing. -Patient is encouraged to call clinic for blood glucose levels less than 70 or above 300 mg /dl. - I will continue Victoza 1.8 mg subcutaneously daily, therapeutically suitable for patient.  - Patient specific target  for A1c; LDL, HDL, Triglycerides, and  Waist Circumference were discussed in detail.  2) BP/HTN: Controlled. Continue current medications  including ACEI/ARB. 3) Lipids/HPL:  continue   simvastatin.  4)  Weight/Diet: CDE consult in progress, exercise, and carbohydrates information provided. 5) hypothyroidism:  I will continue  levothyroxine 125 g by mouth every morning.  - We discussed about correct intake of levothyroxine, at fasting, with water, separated by at least 30 minutes from breakfast, and separated by more than 4 hours from calcium, iron, multivitamins, acid reflux medications (PPIs). -Patient is made aware of the fact that thyroid hormone replacement is needed for life, dose to be adjusted by periodic monitoring of thyroid function tests.  6) Chronic Care/Health Maintenance:  -Patient is on ACEI/ARB and Statin medications and encouraged to continue to follow up with Ophthalmology, Podiatrist at least yearly or according to recommendations, and advised to  stay away from smoking. I have recommended yearly flu vaccine and pneumonia vaccination at least every 5 years; moderate intensity exercise for up to 150 minutes weekly; and  sleep for at least 7 hours a day.  - 25 minutes of time was spent on the care of this patient , 50% of which was applied for counseling on diabetes complications and their preventions.  - I advised patient to maintain close follow up with Lubertha SouthSteve Luking, MD for primary care needs.  Patient is asked to bring meter and  blood glucose logs during their next visit.   Follow up plan: -Return in about 3 months (around 08/29/2016).  Marquis LunchGebre Prakash Kimberling, MD Phone: [phone]  Fax: [phone]   06/01/2016, 2:16 PM

## 2016-06-09 DIAGNOSIS — H02413 Mechanical ptosis of bilateral eyelids: Secondary | ICD-10-CM | POA: Diagnosis not present

## 2016-06-11 ENCOUNTER — Other Ambulatory Visit: Payer: Self-pay | Admitting: "Endocrinology

## 2016-06-11 ENCOUNTER — Other Ambulatory Visit: Payer: Self-pay | Admitting: Internal Medicine

## 2016-06-11 ENCOUNTER — Ambulatory Visit (INDEPENDENT_AMBULATORY_CARE_PROVIDER_SITE_OTHER): Payer: Medicare Other | Admitting: Family Medicine

## 2016-06-11 ENCOUNTER — Encounter: Payer: Self-pay | Admitting: Family Medicine

## 2016-06-11 VITALS — BP 128/78 | Temp 98.6°F | Ht 67.0 in | Wt 239.1 lb

## 2016-06-11 DIAGNOSIS — I10 Essential (primary) hypertension: Secondary | ICD-10-CM

## 2016-06-11 DIAGNOSIS — G629 Polyneuropathy, unspecified: Secondary | ICD-10-CM

## 2016-06-11 DIAGNOSIS — L918 Other hypertrophic disorders of the skin: Secondary | ICD-10-CM

## 2016-06-11 MED ORDER — TRAMADOL HCL 50 MG PO TABS: ORAL_TABLET | ORAL | 5 refills | Status: DC

## 2016-06-11 MED ORDER — ALPRAZOLAM 1 MG PO TABS: ORAL_TABLET | ORAL | 5 refills | Status: DC

## 2016-06-11 NOTE — Progress Notes (Signed)
   Subjective:    Patient ID: [name] [name], female    DOB: July 19, 1945, 71 y.o.   MRN: [id number]  Hypertension  This is a chronic problem. The current episode started more than 1 year ago. There are no compliance problems.    Patient has c/o nasal congestion,headache and pressure, diminished energy. Several days duration. States now feeling quite a bit better with this. Next  Ongoing difficulties with insomnia. Also considerable challenges with anxiety. States definitely needs her Xanax in order to conduct her usual activities  Blood pressure medicine and blood pressure levels reviewed today with patient. Compliant with blood pressure medicine. States does not miss a dose. No obvious side effects. Blood pressure generally good when checked elsewhere. Watching salt intake.   Discussion held regarding patient's diabetes and thyroid condition which is now followed by the specialist   and skin tag. , Right lateral neck. Catches and irritates on closing. Very frustrating. Patient wonders if she needs to see specialists   Review of Systems No headache, no major weight loss or weight gain, no chest pain no back pain abdominal pain no change in bowel habits complete ROS otherwise negative     Objective:   Physical Exam  Alert vitals stable, NAD. Blood pressure good on repeat. HEENT normal. Lungs clear. Heart regular rate and rhythm. Right lower neck distinct large skin tag slightly irritated area of prior abrasion and bleeding  Procedure note patient was prepped draped anesthetized the skin tag was excised utilizing sterile condition. Cauterized with silver nitrate      Assessment & Plan:  Impression 1 hypertension discussed good control maintain same meds #2 ongoing insomnia and anxiety discussed with ongoing need for Xanax discussed will maintain same #3 diabetes and thyroid. Patient claims compliance with her situation #4 Charcot's joint foot discussed length. Patient finally amenable  to my idea that she absolutely needs a podiatrist long-term she has started to go to see a podiatrist reports improvement with their interventions  Wound care discussed

## 2016-06-15 ENCOUNTER — Telehealth: Payer: Self-pay | Admitting: Podiatry

## 2016-06-15 NOTE — Telephone Encounter (Signed)
Pt. Was very upset that authorization expired for her diabetic shoes. Per Melody she called pt, left a message on her vm, that she had to come in 2/27 or her authorization would expire. Pt. Said she never got that message, but shes been sick. Pt. Authorization expired on 06/10/2016.

## 2016-06-18 ENCOUNTER — Telehealth: Payer: Self-pay | Admitting: Family Medicine

## 2016-06-18 NOTE — Telephone Encounter (Signed)
Spoke with Silvio PateShelia at [facility] and informed her per Dr.Steve Luking- patient is currently under the care of cardiology and would have to discuss coming off medication with her cardiologist. Silvio PateShelia verbalized understanding and stated that she would call them.

## 2016-06-18 NOTE — Telephone Encounter (Signed)
Received a phone call from [place] BeachShelia at [facility]. She called stating that the patient was seen there on 06/10/16 and was told that she would need a procedure on her eyelids but before the procedure the patient was supposed to have a form filled out by her PCP stating that it was ok to stop blood thinner medications and supplements. Patient seen here on 06/11/16 with no mention of procedure but told eye center that she took herself off of her Eliquis and other supplements on 06/10/16 and is now feeling weak. The patient's procedure has not yet been scheduled but she has not resumed her Eliquis or other supplements.  [facility] stated that the earliest they could get patient in for procedure is the 14th. Please advise?

## 2016-06-18 NOTE — Telephone Encounter (Signed)
Being on or off eliquis should not cause weakness, also pt is a cardia pt under [facility] and they prescribe the med so they will be the ones that have to respond to dosagin issues on the eliquis

## 2016-06-19 ENCOUNTER — Telehealth: Payer: Self-pay | Admitting: Cardiology

## 2016-06-19 NOTE — Telephone Encounter (Signed)
Patient states that Opthamologist told her to stop taking Eliquis and all vitamins prior to eye surgery. Patient states that no surgery was scheduled and she has been off Eliquis for eight days. States that she was told by Optamolgist to contact Dr.McDowell to ask about restarting. / tg

## 2016-06-19 NOTE — Telephone Encounter (Signed)
Patient misunderstood instructions from opthalmologist and already stopped Eliquis for the surgery which she states she will no longer wants. Do you want her to simply restart Eliquis ?

## 2016-06-19 NOTE — Telephone Encounter (Signed)
Yes, please restart her Eliquis.

## 2016-06-19 NOTE — Telephone Encounter (Signed)
Pt made aware to restart Eliquis

## 2016-06-22 ENCOUNTER — Emergency Department (HOSPITAL_COMMUNITY)
Admission: EM | Admit: 2016-06-22 | Discharge: 2016-06-22 | Disposition: A | Discharge status: Home / Self Care | Payer: Medicare Other | Attending: Emergency Medicine | Admitting: Emergency Medicine

## 2016-06-22 ENCOUNTER — Emergency Department (HOSPITAL_COMMUNITY): Payer: Medicare Other

## 2016-06-22 ENCOUNTER — Encounter (HOSPITAL_COMMUNITY): Payer: Self-pay | Admitting: Cardiology

## 2016-06-22 DIAGNOSIS — E119 Type 2 diabetes mellitus without complications: Secondary | ICD-10-CM | POA: Insufficient documentation

## 2016-06-22 DIAGNOSIS — Z794 Long term (current) use of insulin: Secondary | ICD-10-CM | POA: Diagnosis not present

## 2016-06-22 DIAGNOSIS — R0789 Other chest pain: Secondary | ICD-10-CM | POA: Diagnosis not present

## 2016-06-22 DIAGNOSIS — E039 Hypothyroidism, unspecified: Secondary | ICD-10-CM | POA: Diagnosis not present

## 2016-06-22 DIAGNOSIS — I2510 Atherosclerotic heart disease of native coronary artery without angina pectoris: Secondary | ICD-10-CM | POA: Diagnosis not present

## 2016-06-22 DIAGNOSIS — R109 Unspecified abdominal pain: Secondary | ICD-10-CM | POA: Insufficient documentation

## 2016-06-22 DIAGNOSIS — R079 Chest pain, unspecified: Secondary | ICD-10-CM | POA: Diagnosis present

## 2016-06-22 DIAGNOSIS — I5022 Chronic systolic (congestive) heart failure: Secondary | ICD-10-CM | POA: Insufficient documentation

## 2016-06-22 DIAGNOSIS — I110 Hypertensive heart disease with heart failure: Secondary | ICD-10-CM | POA: Insufficient documentation

## 2016-06-22 DIAGNOSIS — Z79899 Other long term (current) drug therapy: Secondary | ICD-10-CM | POA: Insufficient documentation

## 2016-06-22 DIAGNOSIS — Z87891 Personal history of nicotine dependence: Secondary | ICD-10-CM | POA: Insufficient documentation

## 2016-06-22 LAB — URINALYSIS, ROUTINE W REFLEX MICROSCOPIC
Bilirubin Urine: NEGATIVE
Glucose, UA: NEGATIVE mg/dL
Ketones, ur: NEGATIVE mg/dL
Leukocytes, UA: NEGATIVE
Nitrite: NEGATIVE
Protein, ur: NEGATIVE mg/dL
Specific Gravity, Urine: 1.014 (ref 1.005–1.030)
pH: 5 (ref 5.0–8.0)

## 2016-06-22 MED ORDER — ACETAMINOPHEN 325 MG PO TABS
650.0000 mg | ORAL_TABLET | Freq: Once | ORAL | Status: AC
  Administered 2016-06-22: 650 mg via ORAL
  Filled 2016-06-22: qty 2

## 2016-06-22 NOTE — ED Notes (Signed)
Pt ambulated to the bathroom with no distress noted.

## 2016-06-22 NOTE — ED Notes (Signed)
Patient states "I think I'm just going to leave. I feel a little better today anyway. I can follow up with Dr Gerda DissLuking." Advised patient of wait time and status update. Patient states "I'll wait another hour and see if they call me back then."

## 2016-06-22 NOTE — ED Triage Notes (Signed)
Left upper flank pain times 2 days.  Denies any injury.  States pain is similar to when she broke ribs previously.

## 2016-06-22 NOTE — ED Provider Notes (Signed)
[facility]-EM[facility]GENCY DEPT Provider Note   CSN: [id number] Arrival date & time: 06/22/16  1112     History   Chief Complaint Chief Complaint  Patient presents with  . Flank Pain    HPI [name] [name] is a 71 y.o. female.  HPI   71 year old female with left lateral chest pain. Onset about 2 days ago. She denies any discrete trauma or strain aside from moving some boxes shortly before the onset of her symptoms. She says they were not particularly heavy though. Initially the pain is very sharp and severe. It has gradually improved but still persists. Initially her pain was exacerbated by deep breathing and certain movements but this has improved as well too. She does not feel short of breath. No cough. No unusual leg pain or swelling. She is on the Levaquin is for history of atrial fibrillation. She has not tried taking anything for her pain. She has no urinary complaints.  Past Medical History:  Diagnosis Date  . Atrial fibrillation (HCC)   . Cardiac defibrillator in place   . Chronic systolic heart failure (HCC)   . Colon polyps    20 yrs ago  . Coronary atherosclerosis of native coronary artery    Multivessel, patent grafts 2007  . Diabetes mellitus, type 2 (HCC)   . Essential hypertension, benign   . Hyperlipidemia   . Hypothyroidism   . Hypothyroidism   . Ischemic cardiomyopathy    Ejection Fraction of 25% improved to 45-50% following biventicular pacing, Status post biventricular dual-chamber ICD - [employer]  . Myocardial infarction   . PVD (peripheral vascular disease) (HCC)   . Ulcer [facility])     Patient Active Problem List   Diagnosis Date Noted  . Charcot's joint disease due to secondary diabetes (HCC) 12/18/2015  . Sensory neuropathy (HCC) 12/13/2014  . Peripheral vascular disease, unspecified 09/14/2013  . Atherosclerosis of native arteries of the extremities with ulceration(440.23) 07/11/2013  . Atrial fibrillation (HCC) 09/12/2012  . 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 Biventricular cardiac pacemaker in situ 06/05/2009  . Morbid obesity due to excess calories (HCC) 12/06/2008  . Coronary atherosclerosis of native coronary artery 12/05/2008  . Secondary cardiomyopathy, unspecified 10/17/2008  . Hypothyroidism 04/04/2008  . Type 2 diabetes mellitus with vascular disease (HCC) 04/04/2008  . Hyperlipidemia 04/04/2008  . Essential hypertension, benign 04/04/2008    Past Surgical History:  Procedure Laterality Date  . ABDOMINAL AORTAGRAM N/A 08/04/2013   Procedure: ABDOMINAL Ronny Flurry;  Surgeon: Sherren Kerns, MD;  Location: [facility] CATH LAB;  Service: Cardiovascular;  Laterality: N/A;  . APPENDECTOMY    . BI-VENTRICULAR PACEMAKER INSERTION N/A 11/20/2013   Procedure: BI-VENTRICULAR PACEMAKER INSERTION (CRT-P);  Surgeon: Marinus Maw, MD;  Location: [facility] CATH LAB;  Service: Cardiovascular;  Laterality: N/A;  . Bilateral tubal ligation    . CARDIAC DEFIBRILLATOR PLACEMENT     [employer] [place]  . CESAREAN SECTION    . CORONARY ARTERY BYPASS GRAFT  2001   [facility] - LIMA to LAD, SVG to ramus, SVG to OM/PLB, SVG to RCA   . FLEXIBLE SIGMOIDOSCOPY  07/19/1998   Dr. Karilyn Cota  . TONSILLECTOMY      OB History    No data available       Home Medications    Prior to Admission medications   Medication Sig Start Date End Date Taking? Authorizing Provider  ALPRAZolam (XANAX) 1 MG tablet TAKE 1/2-1 TABLET 2 TIMES A DAY AS NEEDED  FOR ANXIETY 06/11/16   Merlyn Albert, MD  B-D INS SYR ULTRAFINE 1CC/30G 30G X 1/2" 1 ML MISC USE TO INJECT INSULIN 4 TIMES A DAY 11/12/15   Roma Kayser, MD  B-D ULTRAFINE III SHORT PEN 31G X 8 MM MISC USE 1 DAILY AT BEDTIME 03/16/16   Roma Kayser, MD  Blood Glucose Monitoring Suppl (ACCU-CHEK AVIVA) device Use as instructed 05/06/15   Roma Kayser, MD  carvedilol (COREG) 25 MG tablet TAKE ONE TABLET TWICE DAILY WITH A MEAL 04/10/16   Jonelle Sidle,  MD  Cholecalciferol (VITAMIN D) 400 UNITS capsule Take 400 Units by mouth daily.    Historical Provider, MD  ELIQUIS 5 MG TABS tablet TAKE ONE TABLET TWICE DAILY 01/09/16   Marinus Maw, MD  ELIQUIS 5 MG TABS tablet TAKE ONE TABLET TWICE DAILY 06/12/16   Jonelle Sidle, MD  enalapril (VASOTEC) 10 MG tablet Take 1 tablet (10 mg total) by mouth 2 (two) times daily. 05/12/16   Jonelle Sidle, MD  furosemide (LASIX) 40 MG tablet TAKE ONE TABLET TWICE DAILY 06/11/15   Marinus Maw, MD  glucose blood (ACCU-CHEK AVIVA) test strip Use to test glucose 4 times a day 05/06/15   Roma Kayser, MD  insulin lispro (HUMALOG KWIKPEN) 100 UNIT/ML KiwkPen Inject 0.2-0.26 mLs (20-26 Units total) into the skin 3 (three) times daily. 06/01/16   Roma Kayser, MD  LEVEMIR FLEXTOUCH 100 UNIT/ML Pen INJECT 80 UNITS INTO THE SKIN DAILY AT 10:00PM 06/12/16   Roma Kayser, MD  multivitamin [facility]) per tablet Take 1 tablet by mouth daily.      Historical Provider, MD  Omega-3 Fatty Acids (FISH OIL) 1000 MG CAPS Take 1,000 mg by mouth daily.    Historical Provider, MD  OVER THE COUNTER MEDICATION Take 1 tablet by mouth daily. Phyomega vitamin.    Historical Provider, MD  potassium chloride SA (K-DUR,KLOR-CON) 20 MEQ tablet TAKE ONE TABLET TWICE DAILY 02/11/16   Jonelle Sidle, MD  SYNTHROID 125 MCG tablet TAKE ONE TABLET BY MOUTH EVERY DAY. 03/13/16   Roma Kayser, MD  traMADol (ULTRAM) 50 MG tablet TAKE ONE (1) TABLET EACH DAY AS NEEDED FOR MODERATE PAIN 06/11/16   Merlyn Albert, MD  VICTOZA 18 MG/3ML SOPN INJECT 0.3 ML (1.8 MG TOTAL) INTO THE SKIN ONCE DAILY. 04/14/16   Roma Kayser, MD  vitamin E 600 UNIT capsule Take 600 Units by mouth daily.      Historical Provider, MD    Family History Family History  Problem Relation Age of Onset  . Diabetes Mother   . Ulcers Mother   . Heart disease Mother     before age 46  . Hyperlipidemia Mother   . Hypertension Mother   .  Diabetes Father   . Heart attack Father   . Hyperlipidemia Father   . Hypertension Father   . Heart disease Father     before age 39  . AAA (abdominal aortic aneurysm) Father   . Diabetes Sister   . Heart disease Sister     before age 58  . Heart attack Sister   . Coronary artery disease Sister 7    Social History Social History  Substance Use Topics  . Smoking status: Former Smoker    Quit date: 09/16/1991  . Smokeless tobacco: Never Used  . Alcohol use No     Allergies   Avandia [rosiglitazone]; Celebrex [celecoxib]; and Tape  Review of Systems Review of Systems   Physical Exam Updated Vital Signs BP 121/61 (BP Location: Left Arm)   Pulse 73   Temp 98.3 F (36.8 C) (Oral)   Resp 20   Ht 5\' 7"  (1.702 m)   Wt 236 lb (107 kg)   SpO2 100%   BMI 36.96 kg/m   Physical Exam  Constitutional: She appears well-developed and well-nourished. No distress.  HENT:  Head: Normocephalic and atraumatic.  Eyes: Conjunctivae are normal. Right eye exhibits no discharge. Left eye exhibits no discharge.  Neck: Neck supple.  Cardiovascular: Normal rate, regular rhythm and normal heart sounds.  Exam reveals no gallop and no friction rub.   No murmur heard. Cardiac device chest wall  Pulmonary/Chest: Effort normal and breath sounds normal. No respiratory distress. She exhibits tenderness.  Patient points to about the mid axillary line on the left over approximately ribs 7 and 8 this area of maximal pain. She is tender there on palpation. No rash or other concerning skin lesions noted. No crepitus. Her breath sounds were symmetric bilaterally and clear. No CVA tenderness.  Abdominal: Soft. She exhibits no distension. There is no tenderness.  Musculoskeletal: She exhibits no edema or tenderness.  Neurological: She is alert.  Skin: Skin is warm and dry.  Psychiatric: She has a normal mood and affect. Her behavior is normal. Thought content normal.  Nursing note and vitals  reviewed.    ED Treatments / Results  Labs (all labs ordered are listed, but only abnormal results are displayed) Labs Reviewed  URINALYSIS, ROUTINE W REFLEX MICROSCOPIC    EKG  EKG Interpretation None       Radiology No results found.  Procedures Procedures (including critical care time)  Medications Ordered in ED Medications - No data to display   Initial Impression / Assessment and Plan / ED Course  I have reviewed the triage vital signs and the nursing notes.  Pertinent labs & imaging results that were available during my care of the patient were reviewed by me and considered in my medical decision making (see chart for details).     71 year old female with left lateral chest pain. Very atypical for ACS given the constant nature for the past few days. Patient has progressively improved. Aside from some chest wall tenderness, her exam is otherwise unremarkable. Oxygen saturations are normal on room air. She has no increased work of breathing. She is afebrile and has regular heart rate. I do not think that she has a PE, particularly with the [place] usage. Doubt dissection. She does not describe infectious symptoms. Will check a UA because of the location of the pain although she denies specific urinary complaints. Pain meds offered but she is declining.   Final Clinical Impressions(s) / ED Diagnoses   Final diagnoses:  Left flank pain    New Prescriptions New Prescriptions   No medications on file     Raeford RazorStephen Anija Brickner, MD 06/22/16 1612

## 2016-06-25 LAB — URINE CULTURE: Culture: >=100000 — AB

## 2016-06-26 ENCOUNTER — Telehealth: Payer: Self-pay | Admitting: *Deleted

## 2016-06-26 NOTE — Telephone Encounter (Signed)
Post ED Visit - Positive Culture Follow-up: Successful Patient Follow-Up  Culture assessed and recommendations reviewed by: []  Enzo BiNathan Batchelder, Pharm.D. []  Celedonio MiyamotoJeremy Frens, Pharm.D., BCPS []  Garvin FilaMike Maccia, Pharm.D. []  Georgina PillionElizabeth Martin, Pharm.D., BCPS []  OketoMinh Pham, [address]Pharm[address]D., BCPS, AAHIVP [x]  Estella HuskMichelle Turner, Pharm.D., BCPS, AAHIVP []  Tennis Mustassie Stewart, [address]Pharm[address] []  Sherle Poeob Vincent, [address]  Positive urine culture  [x]  Patient discharged without antimicrobial prescription and treatment is now indicated []  Organism is resistant to prescribed ED discharge antimicrobial []  Patient with positive blood cultures  Changes discussed with ED provider: Demetrios LollKenneth Leaphart, PA New antibiotic prescription  Keflex 500mg  PO TID X 5 days [facility] [phone]  Contacted patient, date 06/26/2016, time 1214   Lysle PearlRobertson, Masyn Rostro Talley 06/26/2016, 12:13 PM

## 2016-06-26 NOTE — Progress Notes (Signed)
ED Antimicrobial Stewardship Positive Culture Follow Up   [name] [name] is an 71 y.o. female [facility] with a chief complaint of  Chief Complaint  Patient presents with  . Flank Pain    Recent Results (from the past 720 hour(s))  Urine culture     Status: Abnormal   Collection Time: 06/22/16  3:31 PM  Result Value Ref Range Status   Specimen Description URINE, CLEAN CATCH  Final   Special Requests NONE  Final   Culture >=100,000 COLONIES/mL ESCHERICHIA COLI (A)  Final   Report Status 06/25/2016 FINAL  Final   Organism ID, Bacteria ESCHERICHIA COLI (A)  Final      Susceptibility   Escherichia coli - MIC*    AMPICILLIN <=2 SENSITIVE Sensitive     CEFAZOLIN <=4 SENSITIVE Sensitive     CEFTRIAXONE <=1 SENSITIVE Sensitive     CIPROFLOXACIN <=0.25 SENSITIVE Sensitive     GENTAMICIN 8 INTERMEDIATE Intermediate     IMIPENEM <=0.25 SENSITIVE Sensitive     NITROFURANTOIN <=16 SENSITIVE Sensitive     TRIMETH/SULFA <=20 SENSITIVE Sensitive     AMPICILLIN/SULBACTAM <=2 SENSITIVE Sensitive     PIP/TAZO <=4 SENSITIVE Sensitive     Extended ESBL NEGATIVE Sensitive     * >=100,000 COLONIES/mL ESCHERICHIA COLI    [x]  Patient discharged originally without antimicrobial agent and treatment is now indicated  New antibiotic prescription: Cephalexin 500mg  PO TID x 5 days  ED Provider: Demetrios LollKenneth Leaphart, PA-C   Sallee Provencalurner, [name] 06/26/2016, 11:24 AM Infectious Diseases Pharmacist Phone# [phone]

## 2016-07-03 ENCOUNTER — Encounter: Payer: Medicare Other | Admitting: Internal Medicine

## 2016-07-03 ENCOUNTER — Encounter: Payer: Self-pay | Admitting: Internal Medicine

## 2016-07-15 ENCOUNTER — Encounter: Payer: Medicare Other | Admitting: Nurse Practitioner

## 2016-07-16 ENCOUNTER — Other Ambulatory Visit: Payer: Self-pay | Admitting: "Endocrinology

## 2016-07-16 ENCOUNTER — Telehealth: Payer: Self-pay

## 2016-07-16 ENCOUNTER — Encounter: Payer: Self-pay | Admitting: Family Medicine

## 2016-07-16 DIAGNOSIS — E049 Nontoxic goiter, unspecified: Secondary | ICD-10-CM

## 2016-07-16 NOTE — Progress Notes (Signed)
This encounter was created in error - please disregard.

## 2016-07-16 NOTE — Telephone Encounter (Signed)
She did have a small nodule in her thyroid based on ultrasound in 2014. Let us repeat ultrasound  as soon as possible to see if it is her thyroid changing.

## 2016-07-16 NOTE — Telephone Encounter (Signed)
scheduled

## 2016-07-16 NOTE — Telephone Encounter (Signed)
Pt states that her throat feels like it has been "closing up'. I advised that she may need to go to the ER. She says that she is not going to the ER because she "knows its her Thyroid".  We see her for DM type 2. Do we need to schedule appointment?

## 2016-07-22 ENCOUNTER — Ambulatory Visit (HOSPITAL_COMMUNITY): Payer: Medicare Other

## 2016-07-27 ENCOUNTER — Encounter: Payer: Medicare Other | Admitting: *Deleted

## 2016-07-27 ENCOUNTER — Telehealth: Payer: Self-pay | Admitting: Cardiology

## 2016-07-27 NOTE — Telephone Encounter (Signed)
LMOVM reminding pt to send remote transmission.   

## 2016-07-29 DIAGNOSIS — H02413 Mechanical ptosis of bilateral eyelids: Secondary | ICD-10-CM | POA: Diagnosis not present

## 2016-07-31 ENCOUNTER — Encounter: Payer: Self-pay | Admitting: Cardiology

## 2016-08-10 ENCOUNTER — Other Ambulatory Visit: Payer: Self-pay | Admitting: Internal Medicine

## 2016-08-10 ENCOUNTER — Other Ambulatory Visit: Payer: Self-pay | Admitting: "Endocrinology

## 2016-08-10 ENCOUNTER — Other Ambulatory Visit: Payer: Self-pay | Admitting: Cardiology

## 2016-08-18 ENCOUNTER — Other Ambulatory Visit: Payer: Self-pay

## 2016-08-18 MED ORDER — INSULIN PEN NEEDLE 31G X 8 MM MISC: 5 refills | Status: DC

## 2016-08-23 ENCOUNTER — Other Ambulatory Visit: Payer: Self-pay | Admitting: "Endocrinology

## 2016-08-24 ENCOUNTER — Ambulatory Visit (INDEPENDENT_AMBULATORY_CARE_PROVIDER_SITE_OTHER): Payer: Medicare Other | Admitting: *Deleted

## 2016-08-24 DIAGNOSIS — I255 Ischemic cardiomyopathy: Secondary | ICD-10-CM | POA: Diagnosis not present

## 2016-08-25 ENCOUNTER — Encounter: Payer: Self-pay | Admitting: Cardiology

## 2016-08-25 LAB — CUP PACEART REMOTE DEVICE CHECK
Battery Remaining Longevity: 114 mo
Brady Statistic RV Percent Paced: 94 %
Date Time Interrogation Session: 20180514183300
Implantable Lead Implant Date: 20090202
Implantable Lead Implant Date: 20090202
Implantable Lead Location: 753860
Implantable Lead Model: 4554
Implantable Lead Serial Number: 138236
Implantable Lead Serial Number: 28377340
Implantable Pulse Generator Implant Date: 20150810
Lead Channel Impedance Value: 613 Ohm
Lead Channel Impedance Value: 702 Ohm
Lead Channel Pacing Threshold Amplitude: 0.8 V
Lead Channel Pacing Threshold Pulse Width: 0.5 ms
Lead Channel Setting Pacing Amplitude: 1.5 V
Lead Channel Setting Sensing Sensitivity: 2.5 mV
MDC IDC LEAD IMPLANT DT: 20090202
MDC IDC LEAD LOCATION: 753858
MDC IDC LEAD LOCATION: 753859
MDC IDC LEAD SERIAL: 158281
MDC IDC MSMT BATTERY REMAINING PERCENTAGE: 100 %
MDC IDC MSMT LEADCHNL RV IMPEDANCE VALUE: 568 Ohm
MDC IDC PG SERIAL: 118262
MDC IDC SET LEADCHNL LV PACING PULSEWIDTH: 0.5 ms
MDC IDC SET LEADCHNL LV SENSING SENSITIVITY: 2.5 mV
MDC IDC SET LEADCHNL RV PACING AMPLITUDE: 2.4 V
MDC IDC SET LEADCHNL RV PACING PULSEWIDTH: 0.4 ms
MDC IDC STAT BRADY RA PERCENT PACED: 0 %

## 2016-08-25 NOTE — Progress Notes (Signed)
Remote pacemaker transmission.   

## 2016-08-28 ENCOUNTER — Other Ambulatory Visit: Payer: Self-pay | Admitting: "Endocrinology

## 2016-08-28 DIAGNOSIS — E1159 Type 2 diabetes mellitus with other circulatory complications: Secondary | ICD-10-CM | POA: Diagnosis not present

## 2016-08-28 LAB — COMPREHENSIVE METABOLIC PANEL
ALBUMIN: 3.8 g/dL (ref 3.6–5.1)
ALK PHOS: 42 U/L (ref 33–130)
ALT: 14 U/L (ref 6–29)
AST: 17 U/L (ref 10–35)
BUN: 14 mg/dL (ref 7–25)
CHLORIDE: 106 mmol/L (ref 98–110)
CO2: 27 mmol/L (ref 20–31)
CREATININE: 0.78 mg/dL (ref 0.50–0.99)
Calcium: 9 mg/dL (ref 8.6–10.4)
Glucose, Bld: 174 mg/dL — ABNORMAL HIGH (ref 65–99)
Potassium: 4.8 mmol/L (ref 3.5–5.3)
SODIUM: 140 mmol/L (ref 135–146)
Total Bilirubin: 0.9 mg/dL (ref 0.2–1.2)
Total Protein: 6.6 g/dL (ref 6.1–8.1)

## 2016-08-29 LAB — HEMOGLOBIN A1C
Hgb A1c MFr Bld: 8.5 % — ABNORMAL HIGH (ref <5.7)
MEAN PLASMA GLUCOSE: 197 mg/dL

## 2016-08-31 ENCOUNTER — Ambulatory Visit (INDEPENDENT_AMBULATORY_CARE_PROVIDER_SITE_OTHER): Payer: Medicare Other | Admitting: "Endocrinology

## 2016-08-31 ENCOUNTER — Encounter: Payer: Self-pay | Admitting: "Endocrinology

## 2016-08-31 VITALS — BP 166/74 | HR 69 | Ht 67.0 in | Wt 240.0 lb

## 2016-08-31 DIAGNOSIS — E038 Other specified hypothyroidism: Secondary | ICD-10-CM

## 2016-08-31 DIAGNOSIS — E782 Mixed hyperlipidemia: Secondary | ICD-10-CM | POA: Diagnosis not present

## 2016-08-31 DIAGNOSIS — I10 Essential (primary) hypertension: Secondary | ICD-10-CM

## 2016-08-31 DIAGNOSIS — E1159 Type 2 diabetes mellitus with other circulatory complications: Secondary | ICD-10-CM

## 2016-08-31 NOTE — Progress Notes (Signed)
Subjective:    Patient ID: [name] [name], female    DOB: [birth year], PCP Merlyn AlbertLuking, William S, MD   Past Medical History:  Diagnosis Date  . Atrial fibrillation (HCC)   . Cardiac defibrillator in place   . Chronic systolic heart failure (HCC)   . Colon polyps    20 yrs ago  . Coronary atherosclerosis of native coronary artery    Multivessel, patent grafts 2007  . Diabetes mellitus, type 2 (HCC)   . Essential hypertension, benign   . Hyperlipidemia   . Hypothyroidism   . Hypothyroidism   . Ischemic cardiomyopathy    Ejection Fraction of 25% improved to 45-50% following biventicular pacing, Status post biventricular dual-chamber ICD - [employer] [place] H22  . Myocardial infarction (HCC)   . PVD (peripheral vascular disease) (HCC)   . Ulcer    Past Surgical History:  Procedure Laterality Date  . ABDOMINAL AORTAGRAM N/A 08/04/2013   Procedure: ABDOMINAL Ronny FlurryAORTAGRAM;  Surgeon: Sherren Kernsharles E Fields, MD;  [facility];  Service: Cardiovascular;  Laterality: N/A;  . APPENDECTOMY    . BI-VENTRICULAR PACEMAKER INSERTION N/A 11/20/2013   Procedure: BI-VENTRICULAR PACEMAKER INSERTION (CRT-P);  Surgeon: Marinus MawGregg W Taylor, MD;  [facility];  Service: Cardiovascular;  Laterality: N/A;  . Bilateral tubal ligation    . CARDIAC DEFIBRILLATOR PLACEMENT     [employer] [place]  . CESAREAN SECTION    . CORONARY ARTERY BYPASS GRAFT  2001   [facility] - LIMA to LAD, SVG to ramus, SVG to OM/PLB, SVG to RCA   . FLEXIBLE SIGMOIDOSCOPY  07/19/1998   Dr. Karilyn Cotaehman  . TONSILLECTOMY     Social History   Social History  . Marital status: Divorced    Spouse name: N/A  . Number of children: 1  . Years of education: N/A   Occupational History  . Full Time: [employer]   . Disabled   .  Not Employed   Social History Main Topics  . Smoking status: Former Smoker    Quit date: 09/16/1991  . Smokeless tobacco: Never Used  . Alcohol use No  . Drug use: No  . Sexual  activity: No   Other Topics Concern  . None   Social History Narrative  . None   Outpatient Encounter Prescriptions as of 08/31/2016  Medication Sig  . ALPRAZolam (XANAX) 1 MG tablet TAKE 1/2-1 TABLET 2 TIMES A DAY AS NEEDED FOR ANXIETY  . B-D INS SYR ULTRAFINE 1CC/30G 30G X 1/2" 1 ML MISC INJECT 4 TIMES DAILY.  Marland Kitchen. Blood Glucose Monitoring Suppl (ACCU-CHEK AVIVA) device Use as instructed  . carvedilol (COREG) 25 MG tablet TAKE ONE TABLET TWICE DAILY WITH A MEAL  . Cholecalciferol (VITAMIN D) 400 UNITS capsule Take 400 Units by mouth daily.  Marland Kitchen. ELIQUIS 5 MG TABS tablet TAKE ONE TABLET TWICE DAILY  . enalapril (VASOTEC) 10 MG tablet Take 1 tablet (10 mg total) by mouth 2 (two) times daily.  . furosemide (LASIX) 40 MG tablet TAKE ONE TABLET TWICE DAILY (Patient taking differently: TAKE ONE TABLET DAILY. MAY TAKE ONE ADDITIONAL TABLET AS NEEDED FOR FLUID RETENTION)  . furosemide (LASIX) 40 MG tablet TAKE ONE TABLET TWICE DAILY  . glucose blood (ACCU-CHEK AVIVA) test strip Use to test glucose 4 times a day  . insulin lispro (HUMALOG KWIKPEN) 100 UNIT/ML KiwkPen Inject 0.2-0.26 mLs (20-26 Units total) into the skin 3 (three) times daily.  . Insulin Pen Needle (B-D ULTRAFINE III SHORT PEN)  31G X 8 MM MISC Use 5 x daily as directed  . LEVEMIR FLEXTOUCH 100 UNIT/ML Pen INJECT 80 UNITS INTO THE SKIN DAILY AT 10:00PM  . multivitamin (THERAGRAN) per tablet Take 1 tablet by mouth daily.    . Omega-3 Fatty Acids (FISH OIL) 1000 MG CAPS Take 1,000 mg by mouth daily.  Marland Kitchen OVER THE COUNTER MEDICATION Take 1 tablet by mouth daily. Phyomega vitamin.  . potassium chloride SA (K-DUR,KLOR-CON) 20 MEQ tablet TAKE ONE TABLET TWICE DAILY  . SYNTHROID 125 MCG tablet TAKE ONE TABLET BY MOUTH EVERY DAY.  . traMADol (ULTRAM) 50 MG tablet TAKE ONE (1) TABLET EACH DAY AS NEEDED FOR MODERATE PAIN  . VICTOZA 18 MG/3ML SOPN INJECT 0.3ML (1.8MG  TOTAL) INTO THE SKINONCE DAILY  . vitamin E 600 UNIT capsule Take 600 Units by  mouth daily.     No facility-administered encounter medications on file as of 08/31/2016.    ALLERGIES: Allergies  Allergen Reactions  . Avandia [Rosiglitazone] Other (See Comments)    CHF  . Celebrex [Celecoxib] Other (See Comments)    Congestive heart failure  . Tape     Left rash per patient   VACCINATION STATUS: Immunization History  Administered Date(s) Administered  . Influenza,inj,Quad PF,36+ Mos 01/10/2013, 01/01/2014, 12/13/2014, 12/18/2015  . Influenza-Unspecified 01/11/2012  . Pneumococcal Conjugate-13 11/13/2013  . Pneumococcal Polysaccharide-23 08/02/2012  . Zoster 11/25/2009    Diabetes  She presents for her follow-up diabetic visit. She has type 2 diabetes mellitus. Onset time: She was diagnosed at approximate age of 34 years. Her disease course has been worsening. There are no hypoglycemic associated symptoms. Pertinent negatives for hypoglycemia include no confusion, headaches, pallor or seizures. There are no diabetic associated symptoms. Pertinent negatives for diabetes include no chest pain, no polydipsia, no polyphagia and no polyuria. There are no hypoglycemic complications. Symptoms are worsening. Diabetic complications include heart disease. Risk factors for coronary artery disease include diabetes mellitus, dyslipidemia, hypertension, obesity, sedentary lifestyle and tobacco exposure. She is compliant with treatment most of the time. Her weight is increasing steadily. She is following a generally unhealthy diet. She has had a previous visit with a dietitian. Her breakfast blood glucose range is generally 140-180 mg/dl. Her lunch blood glucose range is generally 180-200 mg/dl. Her dinner blood glucose range is generally 180-200 mg/dl. Her overall blood glucose range is 180-200 mg/dl. An ACE inhibitor/angiotensin II receptor blocker is being taken.  Hyperlipidemia  This is a chronic problem. The current episode started more than 1 year ago. Pertinent negatives include  no chest pain, myalgias or shortness of breath. Current antihyperlipidemic treatment includes statins. Risk factors for coronary artery disease include dyslipidemia, diabetes mellitus, hypertension and a sedentary lifestyle.  Hypertension  This is a chronic problem. The current episode started more than 1 year ago. Pertinent negatives include no chest pain, headaches, palpitations or shortness of breath. Past treatments include ACE inhibitors. Compliance problems include diet.  Hypertensive end-organ damage includes CAD/MI. Identifiable causes of hypertension include a thyroid problem.  Thyroid Problem  Presents for follow-up visit. Patient reports no cold intolerance, diarrhea, heat intolerance or palpitations. The symptoms have been improving. Past treatments include levothyroxine. Her past medical history is significant for hyperlipidemia.     Review of Systems  Constitutional: Negative for chills, fever and unexpected weight change.  HENT: Negative for trouble swallowing and voice change.   Eyes: Negative for visual disturbance.  Respiratory: Negative for cough, shortness of breath and wheezing.   Cardiovascular: Negative for chest pain,  palpitations and leg swelling.  Gastrointestinal: Negative for diarrhea, nausea and vomiting.  Endocrine: Negative for cold intolerance, heat intolerance, polydipsia, polyphagia and polyuria.  Musculoskeletal: Negative for arthralgias and myalgias.  Skin: Negative for color change, pallor, rash and wound.  Neurological: Negative for seizures and headaches.  Psychiatric/Behavioral: Negative for confusion and suicidal ideas.    Objective:    BP (!) 166/74   Pulse 69   Ht 5\' 7"  (1.702 m)   Wt 240 lb (108.9 kg)   BMI 37.59 kg/m   Wt Readings from Last 3 Encounters:  08/31/16 240 lb (108.9 kg)  06/22/16 236 lb (107 kg)  06/11/16 239 lb 2 oz (108.5 kg)    Physical Exam  Constitutional: She is oriented to person, place, and time. She appears  well-developed.  HENT:  Head: Normocephalic and atraumatic.  Eyes: EOM are normal.  Neck: Normal range of motion. Neck supple. No tracheal deviation present. Thyromegaly present.  Cardiovascular: Normal rate and regular rhythm.   Pulmonary/Chest: Effort normal and breath sounds normal.  Abdominal: Soft. Bowel sounds are normal. There is no tenderness. There is no guarding.  Musculoskeletal: Normal range of motion. She exhibits no edema.  Neurological: She is alert and oriented to person, place, and time. She has normal reflexes. No cranial nerve deficit. Coordination normal.  Skin: Skin is warm and dry. No rash noted. No erythema. No pallor.  Psychiatric: She has a normal mood and affect. Judgment normal.     CMP ( most recent) CMP     Component Value Date/Time   NA 140 08/28/2016 0925   K 4.8 08/28/2016 0925   CL 106 08/28/2016 0925   CO2 27 08/28/2016 0925   GLUCOSE 174 (H) 08/28/2016 0925   BUN 14 08/28/2016 0925   CREATININE 0.78 08/28/2016 0925   CALCIUM 9.0 08/28/2016 0925   PROT 6.6 08/28/2016 0925   ALBUMIN 3.8 08/28/2016 0925   AST 17 08/28/2016 0925   ALT 14 08/28/2016 0925   ALKPHOS 42 08/28/2016 0925   BILITOT 0.9 08/28/2016 0925   GFRNONAA >89 01/29/2016 0919   GFRAA >89 01/29/2016 0919     Diabetic Labs (most recent): Lab Results  Component Value Date   HGBA1C 8.5 (H) 08/28/2016   HGBA1C 7.6 (H) 05/22/2016   HGBA1C 7.4 (H) 01/29/2016     Lipid Panel ( most recent) Lipid Panel     Component Value Date/Time   CHOL 162 10/29/2015 0840   TRIG 212 (H) 10/29/2015 0840   HDL 29 (L) 10/29/2015 0840   CHOLHDL 5.6 (H) 10/29/2015 0840   VLDL 42 (H) 10/29/2015 0840   LDLCALC 91 10/29/2015 0840   LDLDIRECT 60.5 07/31/2008 1508     Assessment & Plan:   1. Type 2 diabetes mellitus with vascular disease (HCC) -Her diabetes is  complicated by coronary artery disease and patient remains at a high risk for more acute and chronic complications of diabetes  which include CAD, CVA, CKD, retinopathy, and neuropathy. These are all discussed in detail with the patient.  Patient came with stable fasting but above target postprandial glucose profile. She admits to dietary indiscretion however she promises to do better. Her A1c is 8.5% increasing from 7.6% last visit.  Glucose logs and insulin administration records pertaining to this visit,  to be scanned into patient's records.  Recent labs reviewed.   - I have re-counseled the patient on diet management and weight loss  by adopting a carbohydrate restricted / protein rich  Diet.  -  Suggestion is made for patient to avoid simple carbohydrates   from their diet including Cakes , Desserts, Ice Cream,  Soda (  diet and regular) , Sweet Tea , Candies,  Chips, Cookies, Artificial Sweeteners,   and "Sugar-free" Products .  This will help patient to have stable blood glucose profile and potentially avoid unintended  Weight gain.  - Patient is advised to stick to a routine mealtimes to eat 3 meals  a day and avoid unnecessary snacks ( to snack only to correct hypoglycemia).  - The patient  has been  scheduled with Norm Salt, RDN, CDE for individualized DM education.  - I have approached patient with the following individualized plan to manage diabetes and patient agrees.  - I will continue with  Levemir 80 units daily at bedtime, and prandial insulin Humalog 20 units 3 times a day before meals for pre-meal BG readings of 90-150mg /dl, plus patient specific correction dose of rapid acting insulin  for unexpected hyperglycemia above 150mg /dl, associated with strict monitoring of glucose 4 times a day-before meals and at bedtime. - Patient is warned not to take insulin without proper monitoring per orders. -Adjustment parameters are given for hypo and hyperglycemia in writing. -Patient is encouraged to call clinic for blood glucose levels less than 70 or above 300 mg /dl. - I will continue Victoza 1.8 mg  subcutaneously daily, therapeutically suitable for patient.  - Patient specific target  for A1c; LDL, HDL, Triglycerides, and  Waist Circumference were discussed in detail.  2) BP/HTN: Controlled. Continue current medications including ACEI/ARB. 3) Lipids/HPL:  continue   simvastatin.   I will obtain fasting lipid panel during her next visit.  4)  Weight/Diet: CDE consult in progress, exercise, and carbohydrates information provided. 5) hypothyroidism:  I will continue  levothyroxine 125 g by mouth every morning.  - We discussed about correct intake of levothyroxine, at fasting, with water, separated by at least 30 minutes from breakfast, and separated by more than 4 hours from calcium, iron, multivitamins, acid reflux medications (PPIs). -Patient is made aware of the fact that thyroid hormone replacement is needed for life, dose to be adjusted by periodic monitoring of thyroid function tests. -  2014 thyroid ultrasound she did have 9 mm nodule on the right lobe of the thyroid, thyroid not palpable. I will proceed to obtain repeat thyroid ultrasound.   6) Chronic Care/Health Maintenance:  -Patient is on ACEI/ARB and Statin medications and encouraged to continue to follow up with Ophthalmology, Podiatrist at least yearly or according to recommendations, and advised to  stay away from smoking. I have recommended yearly flu vaccine and pneumonia vaccination at least every 5 years; moderate intensity exercise for up to 150 minutes weekly; and  sleep for at least 7 hours a day.  - 25 minutes of time was spent on the care of this patient , 50% of which was applied for counseling on diabetes complications and their preventions.  - I advised patient to maintain close follow up with Merlyn Albert, MD for primary care needs.  Patient is asked to bring meter and  blood glucose logs during their next visit.   Follow up plan: -Return in about 3 months (around 12/01/2016).  Marquis Lunch, MD Phone:  [phone]  Fax: [phone]   08/31/2016, 10:48 AM

## 2016-09-02 ENCOUNTER — Telehealth: Payer: Self-pay | Admitting: "Endocrinology

## 2016-09-02 MED ORDER — INSULIN PEN NEEDLE 31G X 8 MM MISC: 5 refills | Status: AC

## 2016-09-02 MED ORDER — GLUCOSE BLOOD VI STRP: ORAL_STRIP | 5 refills | Status: DC

## 2016-09-02 NOTE — Telephone Encounter (Signed)
[name] [name] is asking for Dr. Fransico HimNida to call in a new Rx for Pin Needles she uses 5 per day so she is needing #150 a month and also test strips which she uses 5 a day. Please call to [facility] and let her know that its been done, please advise?

## 2016-09-02 NOTE — Telephone Encounter (Signed)
Rx sent to [facility]. Pt notified. This is the 2nd prescription sent. Pt did not advise us that she was wanting to switch to [facility] at her last visit. Pt started yelling that "apparently we dont respond to messages". Her voice mail was left today 09-02-16 at 10:15. Telephone message was put in at 10:22. Rx sent and pt called at 10:37.

## 2016-09-11 ENCOUNTER — Other Ambulatory Visit: Payer: Self-pay

## 2016-09-11 MED ORDER — "INSULIN SYRINGE-NEEDLE U-100 30G X 1/2"" 1 ML MISC": 3 refills | Status: DC

## 2016-09-11 MED ORDER — GLUCOSE BLOOD VI STRP: ORAL_STRIP | 5 refills | Status: DC

## 2016-09-29 DIAGNOSIS — H02412 Mechanical ptosis of left eyelid: Secondary | ICD-10-CM | POA: Diagnosis not present

## 2016-09-29 DIAGNOSIS — H02413 Mechanical ptosis of bilateral eyelids: Secondary | ICD-10-CM | POA: Diagnosis not present

## 2016-09-29 DIAGNOSIS — H02411 Mechanical ptosis of right eyelid: Secondary | ICD-10-CM | POA: Diagnosis not present

## 2016-10-08 ENCOUNTER — Other Ambulatory Visit: Payer: Self-pay | Admitting: "Endocrinology

## 2016-11-05 ENCOUNTER — Ambulatory Visit (INDEPENDENT_AMBULATORY_CARE_PROVIDER_SITE_OTHER): Payer: Medicare Other | Admitting: Internal Medicine

## 2016-11-05 ENCOUNTER — Encounter: Payer: Self-pay | Admitting: Internal Medicine

## 2016-11-05 VITALS — BP 136/62 | HR 76 | Ht 67.0 in | Wt 236.0 lb

## 2016-11-05 DIAGNOSIS — I255 Ischemic cardiomyopathy: Secondary | ICD-10-CM

## 2016-11-05 DIAGNOSIS — I481 Persistent atrial fibrillation: Secondary | ICD-10-CM

## 2016-11-05 DIAGNOSIS — Z950 Presence of cardiac pacemaker: Secondary | ICD-10-CM | POA: Diagnosis not present

## 2016-11-05 DIAGNOSIS — I4819 Other persistent atrial fibrillation: Secondary | ICD-10-CM

## 2016-11-05 MED ORDER — FUROSEMIDE 40 MG PO TABS: 40.0000 mg | ORAL_TABLET | Freq: Two times a day (BID) | ORAL | 3 refills | Status: AC

## 2016-11-05 MED ORDER — APIXABAN 5 MG PO TABS: 5.0000 mg | ORAL_TABLET | Freq: Two times a day (BID) | ORAL | 3 refills | Status: AC

## 2016-11-05 MED ORDER — ENALAPRIL MALEATE 10 MG PO TABS: 10.0000 mg | ORAL_TABLET | Freq: Two times a day (BID) | ORAL | 3 refills | Status: AC

## 2016-11-05 MED ORDER — CARVEDILOL 25 MG PO TABS: ORAL_TABLET | ORAL | 3 refills | Status: AC

## 2016-11-05 NOTE — Progress Notes (Signed)
HPI [name] [name] returns today for followup. She is a pleasant 71 yo woman with a h/o ICM, chronic systolic CHF, chronic atrial fib, LBBB, and now high grade heart block,  s/p ICD implant, now BiV PPM. She is here today for ongoing evaluation and management of her PPM and CHF and atrial fib. In the interim, She feels well. She denies chest pain, sob, or syncope. No palpitations. She had a nice improvement in her LV function to 45%.  She notes some arthritic changes in her hands and uses a walker to ambulate. No bleeding problems. Allergies  Allergen Reactions  . Avandia [Rosiglitazone] Other (See Comments)    CHF  . Celebrex [Celecoxib] Other (See Comments)    Congestive heart failure  . Tape     Left rash per patient     Current Outpatient Prescriptions  Medication Sig Dispense Refill  . ALPRAZolam (XANAX) 1 MG tablet TAKE 1/2-1 TABLET 2 TIMES A DAY AS NEEDED FOR ANXIETY 60 tablet 5  . Blood Glucose Monitoring Suppl (ACCU-CHEK AVIVA) device Use as instructed 1 each 0  . carvedilol (COREG) 25 MG tablet TAKE ONE TABLET TWICE DAILY WITH A MEAL 180 tablet 3  . Cholecalciferol (VITAMIN D) 400 UNITS capsule Take 400 Units by mouth daily.    Marland Kitchen. ELIQUIS 5 MG TABS tablet TAKE ONE TABLET TWICE DAILY 60 tablet 3  . enalapril (VASOTEC) 10 MG tablet Take 1 tablet (10 mg total) by mouth 2 (two) times daily. 180 tablet 3  . furosemide (LASIX) 40 MG tablet TAKE ONE TABLET TWICE DAILY (Patient taking differently: TAKE ONE TABLET DAILY. MAY TAKE ONE ADDITIONAL TABLET AS NEEDED FOR FLUID RETENTION) 60 tablet 6  . furosemide (LASIX) 40 MG tablet TAKE ONE TABLET TWICE DAILY 180 tablet 3  . glucose blood (ACCU-CHEK AVIVA) test strip Use to test glucose 4 times a day 150 each 5  . HUMALOG KWIKPEN 100 UNIT/ML KiwkPen INJECT 0.2 TO 0.26ML'S (20 TO 26 UNITS) INTO THE SKIN THREE TIMES DAILY 30 mL 2  . Insulin Pen Needle (B-D ULTRAFINE III SHORT PEN) 31G X 8 MM MISC Use 5 x daily as directed 150 each 5  . Insulin  Syringe-Needle U-100 (B-D INS SYR ULTRAFINE 1CC/30G) 30G X 1/2" 1 ML MISC INJECT 4 TIMES DAILY. 150 each 3  . LEVEMIR FLEXTOUCH 100 UNIT/ML Pen INJECT 80 UNITS INTO THE SKIN DAILY AT 10:00 PM. 30 mL 2  . multivitamin (THERAGRAN) per tablet Take 1 tablet by mouth daily.      . Omega-3 Fatty Acids (FISH OIL) 1000 MG CAPS Take 1,000 mg by mouth daily.    Marland Kitchen. OVER THE COUNTER MEDICATION Take 1 tablet by mouth daily. Phyomega vitamin.    . potassium chloride SA (K-DUR,KLOR-CON) 20 MEQ tablet TAKE ONE TABLET TWICE DAILY 60 tablet 6  . SYNTHROID 125 MCG tablet TAKE ONE TABLET BY MOUTH EVERY DAY. 30 tablet 5  . traMADol (ULTRAM) 50 MG tablet TAKE ONE (1) TABLET EACH DAY AS NEEDED FOR MODERATE PAIN 30 tablet 5  . VICTOZA 18 MG/3ML SOPN INJECT 0.3ML (1.8MG  TOTAL) INTO THE SKINONCE DAILY 9 mL 3  . vitamin E 600 UNIT capsule Take 600 Units by mouth daily.       No current facility-administered medications for this visit.      Past Medical History:  Diagnosis Date  . Atrial fibrillation (HCC)   . Cardiac defibrillator in place   . Chronic systolic heart failure (HCC)   . Colon polyps  20 yrs ago  . Coronary atherosclerosis of native coronary artery    Multivessel, patent grafts 2007  . Diabetes mellitus, type 2 (HCC)   . Essential hypertension, benign   . Hyperlipidemia   . Hypothyroidism   . Hypothyroidism   . Ischemic cardiomyopathy    Ejection Fraction of 25% improved to 45-50% following biventicular pacing, Status post biventricular dual-chamber ICD - [employer]  . Myocardial infarction (HCC)   . PVD (peripheral vascular disease) (HCC)   . Ulcer     ROS:   All systems reviewed and negative except as noted in the HPI.   Past Surgical History:  Procedure Laterality Date  . ABDOMINAL AORTAGRAM N/A 08/04/2013   Procedure: ABDOMINAL Ronny Flurry;  Surgeon: Sherren Kerns, MD;  Location: [facility] LAB;  Service: Cardiovascular;  Laterality: N/A;  . APPENDECTOMY    .  BI-VENTRICULAR PACEMAKER INSERTION N/A 11/20/2013   Procedure: BI-VENTRICULAR PACEMAKER INSERTION (CRT-P);  Surgeon: Marinus Maw, MD;  Location: [facility] C[facility] LAB;  Service: Cardiovascular;  Laterality: N/A;  . Bilateral tubal ligation    . CARDIAC DEFIBRILLATOR PLACEMENT     [employer] [place]  . CESAREAN SECTION    . CORONARY ARTERY BYPASS GRAFT  2001   [facility] - LIMA to LAD, SVG to ramus, SVG to OM/PLB, SVG to RCA   . FLEXIBLE SIGMOIDOSCOPY  07/19/1998   Dr. Karilyn Cota  . TONSILLECTOMY       Family History  Problem Relation Age of Onset  . Diabetes Mother   . Ulcers Mother   . Heart disease Mother        before age 56  . Hyperlipidemia Mother   . Hypertension Mother   . Diabetes Father   . Heart attack Father   . Hyperlipidemia Father   . Hypertension Father   . Heart disease Father        before age 35  . AAA (abdominal aortic aneurysm) Father   . Diabetes Sister   . Heart disease Sister        before age 43  . Heart attack Sister   . Coronary artery disease Sister 74     Social History   Social History  . Marital status: Divorced    Spouse name: N/A  . Number of children: 1  . Years of education: N/A   Occupational History  . Full Time: [employer]   . Disabled   .  Not Employed   Social History Main Topics  . Smoking status: Former Smoker    Quit date: 09/16/1991  . Smokeless tobacco: Never Used  . Alcohol use No  . Drug use: No  . Sexual activity: No   Other Topics Concern  . Not on file   Social History Narrative  . No narrative on file     BP - 132/62, P - 76, R - 16, oxygen sat 94%, weight - 255 lbs  Physical Exam:  Obese appearing 71 yo woman NAD HEENT: Unremarkable Neck:  6 cm JVD, no thyromegally, I do not appreciate a gouter Back:  No CVA tenderness Lungs:  Clear with no wheezes. HEART:  Regular rate rhythm, no murmurs, no rubs, no clicks Abd:  Soft,obese,  positive bowel sounds, no organomegally, no rebound, no  guarding Ext:  2 plus pulses, no edema, no cyanosis, no clubbing Skin:  No rashes no nodules Neuro:  CN II through XII intact, motor grossly intact   DEVICE  Normal BiV PPM device function.  See PaceArt for details.  Assess/Plan:  1. Atrial fib with a CVR - her rates are well controlled and she is tolerating her blood thinners. 2. Chronic systolic heart failure - her symptoms are class 2. No change in meds. She is encouraged to maintain a low sodium diet. 3. BiV PPM - her [employer] BiV PPM is working normally. Will recheck in several months. No changes today 4. Obesity - her weight is stable. She is encouraged to lose weight.  Leonia ReevesGregg Axavier Pressley,M.D.

## 2016-11-05 NOTE — Patient Instructions (Signed)
Medication Instructions:  Your physician recommends that you continue on your current medications as directed. Please refer to the Current Medication list given to you today.   Labwork: NONE  Testing/Procedures: NONE  Follow-Up: Your physician wants you to follow-up in: 1 Year with Dr. Ladona Ridgelaylor. You will receive a reminder letter in the mail two months in advance. If you don't receive a letter, please call our office to schedule the follow-up appointment.  Remote monitoring is used to monitor your Pacemaker of ICD from home. This monitoring reduces the number of office visits required to check your device to one time per year. It allows us to keep an eye on the functioning of your device to ensure it is working properly. You are scheduled for a device check from home on 11/23/16 . You may send your transmission at any time that day. If you have a wireless device, the transmission will be sent automatically. After your physician reviews your transmission, you will receive a postcard with your next transmission date.    Any Other Special Instructions Will Be Listed Below (If Applicable).     If you need a refill on your cardiac medications before your next appointment, please call your pharmacy.  Thank you for choosing [facility]!

## 2016-11-18 ENCOUNTER — Other Ambulatory Visit: Payer: Self-pay | Admitting: "Endocrinology

## 2016-11-18 ENCOUNTER — Encounter: Payer: Self-pay | Admitting: Surgery

## 2016-11-18 DIAGNOSIS — E1159 Type 2 diabetes mellitus with other circulatory complications: Secondary | ICD-10-CM | POA: Diagnosis not present

## 2016-11-18 DIAGNOSIS — E782 Mixed hyperlipidemia: Secondary | ICD-10-CM | POA: Diagnosis not present

## 2016-11-19 LAB — COMPREHENSIVE METABOLIC PANEL
ALBUMIN: 3.9 g/dL (ref 3.6–5.1)
ALK PHOS: 47 U/L (ref 33–130)
ALT: 15 U/L (ref 6–29)
AST: 16 U/L (ref 10–35)
BILIRUBIN TOTAL: 0.7 mg/dL (ref 0.2–1.2)
BUN: 14 mg/dL (ref 7–25)
CALCIUM: 9.7 mg/dL (ref 8.6–10.4)
CO2: 23 mmol/L (ref 20–32)
CREATININE: 0.72 mg/dL (ref 0.50–0.99)
Chloride: 105 mmol/L (ref 98–110)
GLUCOSE: 123 mg/dL — AB (ref 65–99)
Potassium: 5 mmol/L (ref 3.5–5.3)
SODIUM: 140 mmol/L (ref 135–146)
Total Protein: 7 g/dL (ref 6.1–8.1)

## 2016-11-19 LAB — LIPID PANEL
CHOLESTEROL: 169 mg/dL (ref <200)
HDL: 31 mg/dL — ABNORMAL LOW (ref >50)
LDL Cholesterol: 113 mg/dL — ABNORMAL HIGH (ref <100)
Total CHOL/HDL Ratio: 5.5 Ratio — ABNORMAL HIGH (ref <5.0)
Triglycerides: 127 mg/dL (ref <150)
VLDL: 25 mg/dL (ref <30)

## 2016-11-19 LAB — HEMOGLOBIN A1C
Hgb A1c MFr Bld: 8.6 % — ABNORMAL HIGH (ref <5.7)
MEAN PLASMA GLUCOSE: 200 mg/dL

## 2016-11-23 ENCOUNTER — Ambulatory Visit (INDEPENDENT_AMBULATORY_CARE_PROVIDER_SITE_OTHER): Payer: Medicare Other | Admitting: *Deleted

## 2016-11-23 DIAGNOSIS — I481 Persistent atrial fibrillation: Secondary | ICD-10-CM | POA: Diagnosis not present

## 2016-11-23 DIAGNOSIS — Z950 Presence of cardiac pacemaker: Secondary | ICD-10-CM

## 2016-11-23 DIAGNOSIS — I4819 Other persistent atrial fibrillation: Secondary | ICD-10-CM

## 2016-11-23 NOTE — Progress Notes (Signed)
Remote pacemaker transmission.   

## 2016-11-24 ENCOUNTER — Ambulatory Visit (HOSPITAL_COMMUNITY): Admission: RE | Admit: 2016-11-24 | Payer: Medicare Other | Source: Ambulatory Visit

## 2016-11-24 LAB — CUP PACEART REMOTE DEVICE CHECK
Brady Statistic RA Percent Paced: 0 %
Brady Statistic RV Percent Paced: 84 %
Implantable Lead Implant Date: 20090202
Implantable Lead Implant Date: 20090202
Implantable Lead Location: 753858
Implantable Lead Location: 753859
Implantable Lead Model: 4135
Implantable Lead Serial Number: 138236
Implantable Lead Serial Number: 158281
Implantable Lead Serial Number: 28377340
Lead Channel Pacing Threshold Amplitude: 0.9 V
Lead Channel Pacing Threshold Pulse Width: 0.5 ms
Lead Channel Setting Pacing Amplitude: 1.5 V
Lead Channel Setting Pacing Pulse Width: 0.5 ms
Lead Channel Setting Sensing Sensitivity: 2.5 mV
MDC IDC LEAD IMPLANT DT: 20090202
MDC IDC LEAD LOCATION: 753860
MDC IDC MSMT BATTERY REMAINING LONGEVITY: 108 mo
MDC IDC MSMT BATTERY REMAINING PERCENTAGE: 100 %
MDC IDC MSMT LEADCHNL LV IMPEDANCE VALUE: 806 Ohm
MDC IDC MSMT LEADCHNL RA IMPEDANCE VALUE: 655 Ohm
MDC IDC MSMT LEADCHNL RV IMPEDANCE VALUE: 612 Ohm
MDC IDC PG IMPLANT DT: 20150810
MDC IDC PG SERIAL: 118262
MDC IDC SESS DTM: 20180813082100
MDC IDC SET LEADCHNL LV SENSING SENSITIVITY: 2.5 mV
MDC IDC SET LEADCHNL RV PACING AMPLITUDE: 2.4 V
MDC IDC SET LEADCHNL RV PACING PULSEWIDTH: 0.4 ms

## 2016-11-24 LAB — CUP PACEART INCLINIC DEVICE CHECK
Brady Statistic RV Percent Paced: 93 %
Date Time Interrogation Session: 20180814125327
Implantable Lead Implant Date: 20090202
Implantable Lead Location: 753859
Implantable Lead Model: 157
Implantable Lead Model: 4554
Implantable Lead Serial Number: 138236
Implantable Lead Serial Number: 158281
Implantable Lead Serial Number: 28377340
Implantable Pulse Generator Implant Date: 20150810
MDC IDC LEAD IMPLANT DT: 20090202
MDC IDC LEAD IMPLANT DT: 20090202
MDC IDC LEAD LOCATION: 753858
MDC IDC LEAD LOCATION: 753860
Pulse Gen Serial Number: 118262

## 2016-11-30 ENCOUNTER — Encounter: Payer: Medicare Other | Admitting: Surgery

## 2016-12-01 ENCOUNTER — Ambulatory Visit: Payer: Medicare Other | Admitting: "Endocrinology

## 2016-12-03 ENCOUNTER — Ambulatory Visit (HOSPITAL_COMMUNITY)
Admission: RE | Admit: 2016-12-03 | Discharge: 2016-12-03 | Disposition: A | Discharge status: Home / Self Care | Payer: Medicare Other | Source: Ambulatory Visit | Attending: "Endocrinology | Admitting: "Endocrinology

## 2016-12-03 ENCOUNTER — Encounter: Payer: Self-pay | Admitting: Cardiology

## 2016-12-03 DIAGNOSIS — E038 Other specified hypothyroidism: Secondary | ICD-10-CM

## 2016-12-03 DIAGNOSIS — E041 Nontoxic single thyroid nodule: Secondary | ICD-10-CM | POA: Diagnosis not present

## 2016-12-03 DIAGNOSIS — E039 Hypothyroidism, unspecified: Secondary | ICD-10-CM | POA: Diagnosis not present

## 2016-12-03 NOTE — Progress Notes (Signed)
Cardiology Office Note  Date: 12/04/2016   ID: CHERRON BLITZER, DOB 1945-05-29, MRN [id number]  PCP: Mikey Kirschner, MD  Primary Cardiologist: Rozann Lesches, MD   Chief Complaint  Patient presents with  . Cardiomyopathy  . Atrial Fibrillation    History of Present Illness: [name] [name] is a 71 y.o. female last seen in February. She presents for a routine follow-up visit. Reports no exertional chest pain, palpitations, or shortness of breath beyond NYHA class II. Her weight is down about 8 pounds compared to earlier in the year. She does not report any orthopnea or PND, no leg edema beyond mild ankle swelling at times.  She continues to follow with Dr. Lovena Le in the device clinic, [place] biventricular pacemaker in place. She was seen recently in July with normal device function.  I reviewed her recent lab work as outlined below.  Current cardiac regimen includes Eliquis, Coreg, Vasotec, Lasix, and potassium supplements.  Past Medical History:  Diagnosis Date  . Atrial fibrillation ([place])   . Chronic systolic heart failure ([place])   . Colon polyps    20 yrs ago  . Coronary atherosclerosis of native coronary artery    Multivessel, patent grafts 2007  . Diabetes mellitus, type 2 ([place])   . Essential hypertension, benign   . Hyperlipidemia   . Hypothyroidism   . Hypothyroidism   . Ischemic cardiomyopathy    Ejection Fraction of 25% improved to 45-50% following biventicular pacing, Status post biventricular dual-chamber ICD - [employer]  . Myocardial infarction ([place])   . PVD (peripheral vascular disease) ([place])   . Status cardiac pacemaker   . Ulcer     Past Surgical History:  Procedure Laterality Date  . ABDOMINAL AORTAGRAM N/A 08/04/2013   Procedure: ABDOMINAL Maxcine Ham;  Surgeon: Elam Dutch, MD;  Location: [facility] CATH LAB;  Service: Cardiovascular;  Laterality: N/A;  . APPENDECTOMY    . BI-VENTRICULAR PACEMAKER INSERTION N/A  11/20/2013   Procedure: BI-VENTRICULAR PACEMAKER INSERTION (CRT-P);  Surgeon: Evans Lance, MD;  Location: [facility] CATH LAB;  Service: Cardiovascular;  Laterality: N/A;  . Bilateral tubal ligation    . CARDIAC DEFIBRILLATOR PLACEMENT     [employer] [place]  . CESAREAN SECTION    . CORONARY ARTERY BYPASS GRAFT  2001   [facility] - LIMA to LAD, SVG to ramus, SVG to OM/PLB, SVG to RCA   . FLEXIBLE SIGMOIDOSCOPY  07/19/1998   Dr. Laural Golden  . TONSILLECTOMY      Current Outpatient Prescriptions  Medication Sig Dispense Refill  . ALPRAZolam (XANAX) 1 MG tablet TAKE 1/2-1 TABLET 2 TIMES A DAY AS NEEDED FOR ANXIETY 60 tablet 5  . apixaban (ELIQUIS) 5 MG TABS tablet Take 1 tablet (5 mg total) by mouth 2 (two) times daily. 180 tablet 3  . Blood Glucose Monitoring Suppl (ACCU-CHEK AVIVA) device Use as instructed 1 each 0  . carvedilol (COREG) 25 MG tablet TAKE ONE TABLET TWICE DAILY WITH A MEAL 180 tablet 3  . Cholecalciferol (VITAMIN D) 400 UNITS capsule Take 400 Units by mouth daily.    . enalapril (VASOTEC) 10 MG tablet Take 1 tablet (10 mg total) by mouth 2 (two) times daily. 180 tablet 3  . furosemide (LASIX) 40 MG tablet Take 1 tablet (40 mg total) by mouth 2 (two) times daily. 180 tablet 3  . glucose blood (ACCU-CHEK AVIVA) test strip Use to test glucose 4 times a day 150 each 5  . HUMALOG KWIKPEN 100  UNIT/ML KiwkPen INJECT 0.2 TO 0.26ML'S (20 TO 26 UNITS) INTO THE SKIN THREE TIMES DAILY 30 mL 2  . Insulin Pen Needle (B-D ULTRAFINE III SHORT PEN) 31G X 8 MM MISC Use 5 x daily as directed 150 each 5  . Insulin Syringe-Needle U-100 (B-D INS SYR ULTRAFINE 1CC/30G) 30G X 1/2" 1 ML MISC INJECT 4 TIMES DAILY. 150 each 3  . LEVEMIR FLEXTOUCH 100 UNIT/ML Pen INJECT 80 UNITS INTO THE SKIN DAILY AT 10:00 PM. 30 mL 2  . multivitamin (THERAGRAN) per tablet Take 1 tablet by mouth daily.      . Omega-3 Fatty Acids (FISH OIL) 1000 MG CAPS Take 1,000 mg by mouth daily.    Marland Kitchen OVER THE COUNTER MEDICATION Take 1  tablet by mouth daily. Phyomega vitamin.    . potassium chloride SA (K-DUR,KLOR-CON) 20 MEQ tablet TAKE ONE TABLET TWICE DAILY 60 tablet 6  . SYNTHROID 125 MCG tablet TAKE ONE TABLET BY MOUTH EVERY DAY. 30 tablet 5  . traMADol (ULTRAM) 50 MG tablet TAKE ONE (1) TABLET EACH DAY AS NEEDED FOR MODERATE PAIN 30 tablet 5  . VICTOZA 18 MG/3ML SOPN INJECT 0.3ML (1.8MG TOTAL) INTO THE SKINONCE DAILY 9 mL 3  . vitamin E 600 UNIT capsule Take 600 Units by mouth daily.       No current facility-administered medications for this visit.    Allergies:  Avandia [rosiglitazone]; Celebrex [celecoxib]; and Tape   Social History: The patient  reports that she quit smoking about 25 years ago. She has never used smokeless tobacco. She reports that she does not drink alcohol or use drugs.   ROS:  Please see the history of present illness. Otherwise, complete review of systems is positive for none.  All other systems are reviewed and negative.   Physical Exam: VS:  BP 138/70   Pulse 82   Ht _0  (1.702 m)   Wt 232 lb (105.2 kg)   LMP  (LMP Unknown)   BMI 36.34 kg/m , BMI Body mass index is 36.34 kg/m.  Wt Readings from Last 3 Encounters:  12/04/16 232 lb (105.2 kg)  11/05/16 236 lb (107 kg)  08/31/16 240 lb (108.9 kg)    Obese woman in NAD. Using a cane. HEENT: Conjunctiva and lids normal, oropharynx clear.  Neck: Supple, increased girth, no carotid bruits, no thyromegaly.  Lungs: Clear to auscultation, nonlabored breathing at rest.  Cardiac: Regular rate and rhythm, no S3, no pericardial rub.  Abdomen: Soft, nontender, bowel sounds present, no guarding or rebound.  Extremities: Mild ankle edema, distal pulses 1-2+. Skin: Warm and dry.  Musculoskeletal: No kyphosis.  Neuropsychiatric: Alert and oriented x3, affect grossly appropriate.  ECG: I personally reviewed the tracing from 05/25/2016 which showed a ventricular paced rhythm.  Recent Labwork: 01/29/2016: Hemoglobin 12.9; Platelets  290 05/22/2016: TSH 0.71 11/18/2016: ALT 15; AST 16; BUN 14; Creat 0.72; Potassium 5.0; Sodium 140     Component Value Date/Time   CHOL 169 11/18/2016 1000   TRIG 127 11/18/2016 1000   HDL 31 (L) 11/18/2016 1000   CHOLHDL 5.5 (H) 11/18/2016 1000   VLDL 25 11/18/2016 1000   LDLCALC 113 (H) 11/18/2016 1000   LDLDIRECT 60.5 07/31/2008 1508    Other Studies Reviewed Today:  Echocardiogram 10/29/2015: Study Conclusions  - Left ventricle: The cavity size was mildly dilated. Wall   thickness was increased in a pattern of mild LVH. Systolic   function was mildly to moderately reduced. The estimated ejection   fraction  was in the range of 40% to 45%. Diffuse hypokinesis.   There is akinesis of the mid-apicalanteroseptal myocardium. There   is severe hypokinesis of the basalinferior myocardium. Doppler   parameters are consistent with restrictive physiology, indicative   of decreased left ventricular diastolic compliance and/or   increased left atrial pressure. - Aortic valve: Moderately calcified annulus. Trileaflet; mildly   calcified leaflets. Moderate thickening and calcification   involving the noncoronary cusp. Noncoronary cusp mobility was   restricted. There was mild stenosis. There was trivial   regurgitation. Mean gradient (S): 10 mm Hg. Peak gradient (S): 18   mm Hg. VTI ratio of LVOT to aortic valve: 0.53. - Mitral valve: Calcified annulus. There was mild regurgitation. - Left atrium: The atrium was moderately dilated. - Right ventricle: Pacer wire or catheter noted in right ventricle.   Systolic function was low normal. - Right atrium: The atrium was at the upper limits of normal in   size. - Tricuspid valve: There was mild regurgitation. - Pulmonary arteries: PA peak pressure: 44 mm Hg (S). - Pericardium, extracardiac: There was no pericardial effusion.  Impressions:  - Mild LV chamber dilatation with mild LVH and LVEF 40-45%. Diffuse   hypokinesis with wall motion  abnormalities consistent with   ischemic cardiomyopathy. Restrictive diastolic filling pattern   with increased LV filling pressure. Moderate left atrial   enlargement. MAC with mild mitral regurgitation. Mild calcific   aortic stenosis with trivial aortic regurgitation. Low normal RV   contraction with device wire present. Mild tricuspid   regurgitation with PASP 44 mmHg.  Assessment and Plan:  1. Ischemic cardiomyopathy with LVEF 40-45% by echocardiogram last year. She is symptomatically stable in terms of fluid status. Plan to continue current medications and diuretic dose, follow-up echocardiogram for reevaluation of LVEF.  2. CAD status post CABG with patent bypass grafts 10 years ago. She reports no angina on medical therapy and remains comfortable with observation. We have discussed follow-up stress testing if angina develops/progresses.  3. Essential hypertension, no changes made to current regimen. Keep follow-up with Dr. Wolfgang Phoenix.  4. Paroxysmal atrial fibrillation, continues without palpitations on current medications including Coreg and Eliquis. Recent lab work reviewed. She denies any bleeding problems.  Current medicines were reviewed with the patient today.   Orders Placed This Encounter  Procedures  . ECHOCARDIOGRAM COMPLETE    Disposition: Follow-up in 6 months.  Signed, Satira Sark, MD, [facility] 12/04/2016 9:46 AM    [facility]. [address] [place], [place] [address] Phone: [phone]; Fax: [phone]

## 2016-12-04 ENCOUNTER — Ambulatory Visit (INDEPENDENT_AMBULATORY_CARE_PROVIDER_SITE_OTHER): Payer: Medicare Other | Admitting: Cardiology

## 2016-12-04 ENCOUNTER — Encounter: Payer: Self-pay | Admitting: Cardiology

## 2016-12-04 VITALS — BP 138/70 | HR 82 | Ht 67.0 in | Wt 232.0 lb

## 2016-12-04 DIAGNOSIS — I10 Essential (primary) hypertension: Secondary | ICD-10-CM

## 2016-12-04 DIAGNOSIS — I481 Persistent atrial fibrillation: Secondary | ICD-10-CM

## 2016-12-04 DIAGNOSIS — I2510 Atherosclerotic heart disease of native coronary artery without angina pectoris: Secondary | ICD-10-CM | POA: Diagnosis not present

## 2016-12-04 DIAGNOSIS — I4819 Other persistent atrial fibrillation: Secondary | ICD-10-CM

## 2016-12-04 DIAGNOSIS — I255 Ischemic cardiomyopathy: Secondary | ICD-10-CM | POA: Diagnosis not present

## 2016-12-04 NOTE — Patient Instructions (Signed)
Medication Instructions:  Your physician recommends that you continue on your current medications as directed. Please refer to the Current Medication list given to you today.   Labwork: NONE  Testing/Procedures: Your physician has requested that you have an echocardiogram. Echocardiography is a painless test that uses sound waves to create images of your heart. It provides your doctor with information about the size and shape of your heart and how well your heart's chambers and valves are working. This procedure takes approximately one hour. There are no restrictions for this procedure.    Follow-Up: Your physician wants you to follow-up in: 6 months with Dr. Diona Browner. You will receive a reminder letter in the mail two months in advance. If you don't receive a letter, please call our office to schedule the follow-up appointment.   Any Other Special Instructions Will Be Listed Below (If Applicable).    If you need a refill on your cardiac medications before your next appointment, please call your pharmacy. Thank you for choosing [facility]!

## 2016-12-12 ENCOUNTER — Other Ambulatory Visit: Payer: Self-pay | Admitting: Family Medicine

## 2016-12-12 ENCOUNTER — Other Ambulatory Visit: Payer: Self-pay | Admitting: "Endocrinology

## 2016-12-15 ENCOUNTER — Ambulatory Visit (INDEPENDENT_AMBULATORY_CARE_PROVIDER_SITE_OTHER): Payer: Medicare Other | Admitting: Family Medicine

## 2016-12-15 ENCOUNTER — Encounter: Payer: Self-pay | Admitting: Family Medicine

## 2016-12-15 VITALS — BP 100/60 | Ht 67.0 in | Wt 231.0 lb

## 2016-12-15 DIAGNOSIS — E13610 Other specified diabetes mellitus with diabetic neuropathic arthropathy: Secondary | ICD-10-CM | POA: Diagnosis not present

## 2016-12-15 DIAGNOSIS — I10 Essential (primary) hypertension: Secondary | ICD-10-CM | POA: Diagnosis not present

## 2016-12-15 DIAGNOSIS — M720 Palmar fascial fibromatosis [Dupuytren]: Secondary | ICD-10-CM

## 2016-12-15 DIAGNOSIS — Z23 Encounter for immunization: Secondary | ICD-10-CM

## 2016-12-15 DIAGNOSIS — G629 Polyneuropathy, unspecified: Secondary | ICD-10-CM

## 2016-12-15 MED ORDER — ALPRAZOLAM 1 MG PO TABS: ORAL_TABLET | ORAL | 5 refills | Status: DC

## 2016-12-15 MED ORDER — TRAMADOL HCL 50 MG PO TABS: ORAL_TABLET | ORAL | 5 refills | Status: DC

## 2016-12-15 NOTE — Progress Notes (Signed)
   Subjective:    Patient ID: [name] [name], female    DOB: Oct 02, 1945, 71 y.o.   MRN: [id number]  Hypertension  This is a recurrent problem. The current episode started more than 1 year ago.  Tries to eat healthy,and exercises. No other concerns.  Blood pressure medicine and blood pressure levels reviewed today with patient. Compliant with blood pressure medicine. States does not miss a dose. No obvious side effects. Blood pressure generally good when checked elsewhere. Watching salt intake.  Patient notes progressive difficulties with contractures in her hands. Left hand worse than right. Having difficulty gripping veins now  Continues experience painful diabetic neuropathy. Uses the Ultram each evening for this. Has not seen the podiatrist for quite some time despite her history of Charcot's disease and neuropathy  Ongoing trouble sleeping  Patient compliant with insomnia medication. Generally takes most nights. No obvious morning drowsiness. Definitely helps patient sleep. Without it patient states would not get a good nights rest.   Review of Systems No headache, no major weight loss or weight gain, no chest pain no back pain abdominal pain no change in bowel habits complete ROS otherwise negative     Objective:   Physical Exam Alert and oriented, vitals reviewed and stable, NAD ENT-TM's and ext canals WNL bilat via otoscopic exam Soft palate, tonsils and post pharynx WNL via oropharyngeal exam Neck-symmetric, no masses; thyroid nonpalpable and nontender Pulmonary-no tachypnea or accessory muscle use; Clear without wheezes via auscultation Card--no abnrml murmurs, rhythm reg and rate WNL Carotid pulses symmetric, without bruits        Assessment & Plan:  Impression 1 insomnia discussed maintain same meds #2 hypertension decent control discussed maintain same compliance discussed #3 sensory neuropathy. Pain medicine refilled. #4 type 2 diabetes followed by Dr. Fransico [name] murmur  5 peripheral arterial disease #6 coronary artery disease #7 history of recurrent noncompliance. Patient encouraged once again to establish regular podiatry visits with her history of Charcot's disease. Recommend wellness exam with [name] JonesCarolyn in the coming months. Medications refilled. Flu shot given.

## 2016-12-15 NOTE — Telephone Encounter (Signed)
Last seen 06/11/2016  

## 2016-12-16 ENCOUNTER — Encounter: Payer: Self-pay | Admitting: Family Medicine

## 2016-12-16 ENCOUNTER — Other Ambulatory Visit: Payer: Self-pay

## 2016-12-16 MED ORDER — GLUCOSE BLOOD VI STRP: ORAL_STRIP | 5 refills | Status: DC

## 2016-12-24 ENCOUNTER — Ambulatory Visit (INDEPENDENT_AMBULATORY_CARE_PROVIDER_SITE_OTHER): Payer: Medicare Other | Admitting: "Endocrinology

## 2016-12-24 ENCOUNTER — Encounter: Payer: Self-pay | Admitting: "Endocrinology

## 2016-12-24 VITALS — BP 167/70 | HR 60 | Ht 67.0 in | Wt 230.0 lb

## 2016-12-24 DIAGNOSIS — E782 Mixed hyperlipidemia: Secondary | ICD-10-CM | POA: Diagnosis not present

## 2016-12-24 DIAGNOSIS — E1159 Type 2 diabetes mellitus with other circulatory complications: Secondary | ICD-10-CM | POA: Diagnosis not present

## 2016-12-24 DIAGNOSIS — E049 Nontoxic goiter, unspecified: Secondary | ICD-10-CM | POA: Diagnosis not present

## 2016-12-24 DIAGNOSIS — I10 Essential (primary) hypertension: Secondary | ICD-10-CM

## 2016-12-24 DIAGNOSIS — Z6835 Body mass index (BMI) 35.0-35.9, adult: Secondary | ICD-10-CM | POA: Diagnosis not present

## 2016-12-24 DIAGNOSIS — E038 Other specified hypothyroidism: Secondary | ICD-10-CM

## 2016-12-24 MED ORDER — FREESTYLE LIBRE READER DEVI: 1.0000 | Freq: Once | 0 refills | Status: AC

## 2016-12-24 MED ORDER — FREESTYLE LIBRE SENSOR SYSTEM MISC: 2 refills | Status: AC

## 2016-12-24 MED ORDER — INSULIN LISPRO 100 UNIT/ML (KWIKPEN): 22.0000 [IU] | PEN_INJECTOR | Freq: Three times a day (TID) | SUBCUTANEOUS | 2 refills | Status: DC

## 2016-12-24 NOTE — Progress Notes (Signed)
Subjective:    Patient ID: [name] [name], female    DOB: 1945/07/21, PCP Merlyn Albert, MD   Past Medical History:  Diagnosis Date  . Atrial fibrillation (HCC)   . Chronic systolic heart failure (HCC)   . Colon polyps    20 yrs ago  . Coronary atherosclerosis of native coronary artery    Multivessel, patent grafts 2007  . Diabetes mellitus, type 2 (HCC)   . Essential hypertension, benign   . Hyperlipidemia   . Hypothyroidism   . Hypothyroidism   . Ischemic cardiomyopathy    Ejection Fraction of 25% improved to 45-50% following biventicular pacing, Status post biventricular dual-chamber ICD - [employer]  . Myocardial infarction (HCC)   . PVD (peripheral vascular disease) (HCC)   . Status cardiac pacemaker   . Ulcer    Past Surgical History:  Procedure Laterality Date  . ABDOMINAL AORTAGRAM N/A 08/04/2013   Procedure: ABDOMINAL Ronny Flurry;  Surgeon: Sherren Kerns, MD;  Location: [facility] CATH LAB;  Service: Cardiovascular;  Laterality: N/A;  . APPENDECTOMY    . BI-VENTRICULAR PACEMAKER INSERTION N/A 11/20/2013   Procedure: BI-VENTRICULAR PACEMAKER INSERTION (CRT-P);  Surgeon: Marinus Maw, MD;  Location: [facility] CATH LAB;  Service: Cardiovascular;  Laterality: N/A;  . Bilateral tubal ligation    . CARDIAC DEFIBRILLATOR PLACEMENT     [employer] [facility] [place]  . CESAREAN SECTION    . CORONARY ARTERY BYPASS GRAFT  2001   [place] - LIMA to LAD, SVG to ramus, SVG to OM/PLB, SVG to RCA   . FLEXIBLE SIGMOIDOSCOPY  07/19/1998   Dr. Karilyn Cota  . TONSILLECTOMY     Social History   Social History  . Marital status: Divorced    Spouse name: N/A  . Number of children: 1  . Years of education: N/A   Occupational History  . Full Time: [employer]   . Disabled   .  Not Employed   Social History Main Topics  . Smoking status: Former Smoker    Quit date: 09/16/1991  . Smokeless tobacco: Never Used  . Alcohol use No  . Drug use: No  . Sexual activity:  No   Other Topics Concern  . None   Social History Narrative  . None   Outpatient Encounter Prescriptions as of 12/24/2016  Medication Sig  . ALPRAZolam (XANAX) 1 MG tablet TAKE 1/2-1 TABLET 2 TIMES A DAY AS NEEDED FOR ANXIETY  . apixaban (ELIQUIS) 5 MG TABS tablet Take 1 tablet (5 mg total) by mouth 2 (two) times daily.  . Blood Glucose Monitoring Suppl (ACCU-CHEK AVIVA) device Use as instructed  . carvedilol (COREG) 25 MG tablet TAKE ONE TABLET TWICE DAILY WITH A MEAL  . Cholecalciferol (VITAMIN D) 400 UNITS capsule Take 400 Units by mouth daily.  . Continuous Blood Gluc Receiver (FREESTYLE LIBRE READER) DEVI 1 Piece by Does not apply route once.  . Continuous Blood Gluc Sensor (FREESTYLE LIBRE SENSOR SYSTEM) MISC Use one sensor every 10 days.  . enalapril (VASOTEC) 10 MG tablet Take 1 tablet (10 mg total) by mouth 2 (two) times daily.  . furosemide (LASIX) 40 MG tablet Take 1 tablet (40 mg total) by mouth 2 (two) times daily.  Marland Kitchen glucose blood (ACCU-CHEK AVIVA) test strip Use to test glucose 4 times a day  . insulin lispro (HUMALOG KWIKPEN) 100 UNIT/ML KiwkPen Inject 0.22-0.28 mLs (22-28 Units total) into the skin 3 (three) times daily.  . Insulin Pen Needle (B-D ULTRAFINE III  SHORT PEN) 31G X 8 MM MISC Use 5 x daily as directed  . LEVEMIR FLEXTOUCH 100 UNIT/ML Pen INJECT 80 UNITS INTO THE SKIN DAILY AT 10:00 PM.  . multivitamin (THERAGRAN) per tablet Take 1 tablet by mouth daily.    . Omega-3 Fatty Acids (FISH OIL) 1000 MG CAPS Take 1,000 mg by mouth daily.  Marland Kitchen OVER THE COUNTER MEDICATION Take 1 tablet by mouth daily. Phyomega vitamin.  . potassium chloride SA (K-DUR,KLOR-CON) 20 MEQ tablet TAKE ONE TABLET TWICE DAILY  . SYNTHROID 125 MCG tablet TAKE ONE TABLET ONCE DAILY  . traMADol (ULTRAM) 50 MG tablet TAKE ONE (1) TABLET EACH DAY AS NEEDED FOR MODERATE PAIN  . VICTOZA 18 MG/3ML SOPN INJECT 0.3ML (1.8MG  TOTAL) INTO THE SKINONCE DAILY  . vitamin E 600 UNIT capsule Take 600 Units by  mouth daily.    . [DISCONTINUED] HUMALOG KWIKPEN 100 UNIT/ML KiwkPen INJECT 0.2 TO 0.26ML'S (20 TO 26 UNITS) INTO THE SKIN THREE TIMES DAILY  . [DISCONTINUED] Insulin Syringe-Needle U-100 (B-D INS SYR ULTRAFINE 1CC/30G) 30G X 1/2" 1 ML MISC INJECT 4 TIMES DAILY. (Patient not taking: Reported on 12/15/2016)   No facility-administered encounter medications on file as of 12/24/2016.    ALLERGIES: Allergies  Allergen Reactions  . Avandia [Rosiglitazone] Other (See Comments)    CHF  . Celebrex [Celecoxib] Other (See Comments)    Congestive heart failure  . Tape     Left rash per patient   VACCINATION STATUS: Immunization History  Administered Date(s) Administered  . Influenza,inj,Quad PF,6+ Mos 01/10/2013, 01/01/2014, 12/13/2014, 12/18/2015, 12/15/2016  . Influenza-Unspecified 01/11/2012  . Pneumococcal Conjugate-13 11/13/2013  . Pneumococcal Polysaccharide-23 08/02/2012  . Zoster 11/25/2009    Diabetes  She presents for her follow-up diabetic visit. She has type 2 diabetes mellitus. Onset time: She was diagnosed at approximate age of 34 years. Her disease course has been stable. There are no hypoglycemic associated symptoms. Pertinent negatives for hypoglycemia include no confusion, headaches, pallor or seizures. There are no diabetic associated symptoms. Pertinent negatives for diabetes include no chest pain, no polydipsia, no polyphagia and no polyuria. There are no hypoglycemic complications. Symptoms are stable. Diabetic complications include heart disease. Risk factors for coronary artery disease include diabetes mellitus, dyslipidemia, hypertension, obesity, sedentary lifestyle and tobacco exposure. She is compliant with treatment most of the time. Her weight is increasing steadily. She is following a generally unhealthy diet. She has had a previous visit with a dietitian. Her breakfast blood glucose range is generally 180-200 mg/dl. Her lunch blood glucose range is generally 180-200 mg/dl.  Her dinner blood glucose range is generally 180-200 mg/dl. Her overall blood glucose range is 180-200 mg/dl. An ACE inhibitor/angiotensin II receptor blocker is being taken.  Hyperlipidemia  This is a chronic problem. The current episode started more than 1 year ago. Pertinent negatives include no chest pain, myalgias or shortness of breath. Current antihyperlipidemic treatment includes statins. Risk factors for coronary artery disease include dyslipidemia, diabetes mellitus, hypertension and a sedentary lifestyle.  Hypertension  This is a chronic problem. The current episode started more than 1 year ago. Pertinent negatives include no chest pain, headaches, palpitations or shortness of breath. Past treatments include ACE inhibitors. Compliance problems include diet.  Hypertensive end-organ damage includes CAD/MI. Identifiable causes of hypertension include a thyroid problem.  Thyroid Problem  Presents for follow-up visit. Patient reports no cold intolerance, diarrhea, heat intolerance or palpitations. The symptoms have been improving. Past treatments include levothyroxine. Her past medical history is significant for  hyperlipidemia.     Review of Systems  Constitutional: Negative for chills, fever and unexpected weight change.  HENT: Negative for trouble swallowing and voice change.   Eyes: Negative for visual disturbance.  Respiratory: Negative for cough, shortness of breath and wheezing.   Cardiovascular: Negative for chest pain, palpitations and leg swelling.  Gastrointestinal: Negative for diarrhea, nausea and vomiting.  Endocrine: Negative for cold intolerance, heat intolerance, polydipsia, polyphagia and polyuria.  Musculoskeletal: Negative for arthralgias and myalgias.  Skin: Negative for color change, pallor, rash and wound.  Neurological: Negative for seizures and headaches.  Psychiatric/Behavioral: Negative for confusion and suicidal ideas.    Objective:    BP (!) 167/70   Pulse  60   Ht  (1.702 m)   Wt 230 lb (104.3 kg)   LMP  (LMP Unknown)   BMI 36.02 kg/m   Wt Readings from Last 3 Encounters:  12/24/16 230 lb (104.3 kg)  12/15/16 231 lb (104.8 kg)  12/04/16 232 lb (105.2 kg)    Physical Exam  Constitutional: She is oriented to person, place, and time. She appears well-developed.  HENT:  Head: Normocephalic and atraumatic.  Eyes: EOM are normal.  Neck: Normal range of motion. Neck supple. No tracheal deviation present. Thyromegaly present.  Cardiovascular: Normal rate and regular rhythm.   Pulmonary/Chest: Effort normal and breath sounds normal.  Abdominal: Soft. Bowel sounds are normal. There is no tenderness. There is no guarding.  Musculoskeletal: Normal range of motion. She exhibits no edema.  Neurological: She is alert and oriented to person, place, and time. She has normal reflexes. No cranial nerve deficit. Coordination normal.  Skin: Skin is warm and dry. No rash noted. No erythema. No pallor.  Psychiatric: She has a normal mood and affect. Judgment normal.     CMP ( most recent) CMP     Component Value Date/Time   NA 140 11/18/2016 0956   K 5.0 11/18/2016 0956   CL 105 11/18/2016 0956   CO2 23 11/18/2016 0956   GLUCOSE 123 (H) 11/18/2016 0956   BUN 14 11/18/2016 0956   CREATININE 0.72 11/18/2016 0956   CALCIUM 9.7 11/18/2016 0956   PROT 7.0 11/18/2016 0956   ALBUMIN 3.9 11/18/2016 0956   AST 16 11/18/2016 0956   ALT 15 11/18/2016 0956   ALKPHOS 47 11/18/2016 0956   BILITOT 0.7 11/18/2016 0956   GFRNONAA >89 01/29/2016 0919   GFRAA >89 01/29/2016 0919    Diabetic Labs (most recent): Lab Results  Component Value Date   HGBA1C 8.6 (H) 11/18/2016   HGBA1C 8.5 (H) 08/28/2016   HGBA1C 7.6 (H) 05/22/2016     Lipid Panel ( most recent) Lipid Panel     Component Value Date/Time   CHOL 169 11/18/2016 1000   TRIG 127 11/18/2016 1000   HDL 31 (L) 11/18/2016 1000   CHOLHDL 5.5 (H) 11/18/2016 1000   VLDL 25 11/18/2016 1000    LDLCALC 113 (H) 11/18/2016 1000   LDLDIRECT 60.5 07/31/2008 1508     Assessment & Plan:   1. Type 2 diabetes mellitus with vascular disease (HCC) -Her diabetes is  complicated by coronary artery disease and patient remains at a high risk for more acute and chronic complications of diabetes which include CAD, CVA, CKD, retinopathy, and neuropathy. These are all discussed in detail with the patient.  Patient came with stable fasting but above target postprandial glucose profile. She admits to dietary indiscretion however she promises to do better. Her A1c is 8.6% increasing  from 7.6% last visit.  Glucose logs and insulin administration records pertaining to this visit,  to be scanned into patient's records.  Recent labs reviewed.   - I have re-counseled the patient on diet management and weight loss  by adopting a carbohydrate restricted / protein rich  Diet.  - Suggestion is made for her to avoid simple carbohydrates  from her diet including Cakes, Sweet Desserts, Ice Cream, Soda (diet and regular), Sweet Tea, Candies, Chips, Cookies, Store Bought Juices, Alcohol in Excess of  1-2 drinks a day, Artificial Sweeteners, and "Sugar-free" Products. This will help patient to have stable blood glucose profile and potentially avoid unintended weight gain.   - Patient is advised to stick to a routine mealtimes to eat 3 meals  a day and avoid unnecessary snacks ( to snack only to correct hypoglycemia).  - I have approached patient with the following individualized plan to manage diabetes and patient agrees.  - I will continue with  Levemir 80 units daily at bedtime, and Increase Humalog to 22 units 3 times a day before meals for pre-meal blood glucose  readings of 90-150mg /dl, plus patient specific correction dose of rapid acting insulin  for unexpected hyperglycemia above 150mg /dl, associated with strict monitoring of glucose 4 times a day-before meals and at bedtime. - Patient is warned not to take  insulin without proper monitoring per orders. -Adjustment parameters are given for hypo and hyperglycemia in writing. -Patient is encouraged to call clinic for blood glucose levels less than 70 or above 300 mg /dl. - I will continue Victoza 1.8 mg subcutaneously daily, therapeutically suitable for patient.  - Patient specific target  for A1c; LDL, HDL, Triglycerides, and  Waist Circumference were discussed in detail.  2) BP/HTN: uncontrolled. Continue current medications including ACEI/ARB. 3) Lipids/HPL:    uncontrolled with LDL 113, she is advised to continue taking simvastatin.   4)  Weight/Diet: CDE consult in progress, exercise, and carbohydrates information provided.  5) hypothyroidism:  I will continue  levothyroxine 125 g by mouth every morning.  - We discussed about correct intake of levothyroxine, at fasting, with water, separated by at least 30 minutes from breakfast, and separated by more than 4 hours from calcium, iron, multivitamins, acid reflux medications (PPIs). -Patient is made aware of the fact that thyroid hormone replacement is needed for life, dose to be adjusted by periodic monitoring of thyroid function tests. -  her repeat thyroid/neck ultrasound shows stable 8 mm nodule on the right lower her thyroid. She will not need any intervention or follow-up for this.   6) Chronic Care/Health Maintenance:  -Patient is on ACEI/ARB and Statin medications and encouraged to continue to follow up with Ophthalmology, Podiatrist at least yearly or according to recommendations, and advised to  stay away from smoking. I have recommended yearly flu vaccine and pneumonia vaccination at least every 5 years; moderate intensity exercise for up to 150 minutes weekly; and  sleep for at least 7 hours a day.  - Time spent with the patient: 25 min, of which >50% was spent in reviewing her sugar logs , discussing her hypo- and hyper-glycemic episodes, reviewing her current and  previous labs and  insulin doses and developing a plan to avoid hypo- and hyper-glycemia.    - I advised patient to maintain close follow up with Merlyn AlbertLuking, William S, MD for primary care needs.  Patient is asked to bring meter and  blood glucose logs during her next visit.   Follow up plan: -  Return in about 3 months (around 03/25/2017) for meter, and logs, follow up with pre-visit labs, meter, and logs.  Marquis Lunch, MD Phone: [phone]  Fax: [phone]  This note was partially dictated with voice recognition software. Similar sounding words can be transcribed inadequately or may not  be corrected upon review.  12/24/2016, 10:37 AM

## 2016-12-24 NOTE — Patient Instructions (Signed)

## 2016-12-30 ENCOUNTER — Encounter: Payer: Self-pay | Admitting: Vascular Surgery

## 2016-12-30 ENCOUNTER — Ambulatory Visit (INDEPENDENT_AMBULATORY_CARE_PROVIDER_SITE_OTHER): Payer: Medicare Other | Admitting: Vascular Surgery

## 2016-12-30 VITALS — BP 125/75 | HR 77 | Temp 97.8°F | Resp 18 | Ht 67.0 in | Wt 233.0 lb

## 2016-12-30 DIAGNOSIS — I6529 Occlusion and stenosis of unspecified carotid artery: Secondary | ICD-10-CM | POA: Diagnosis not present

## 2016-12-30 DIAGNOSIS — I739 Peripheral vascular disease, unspecified: Secondary | ICD-10-CM

## 2016-12-30 NOTE — Progress Notes (Signed)
[facility]  Patient name: [name] [name] MRN: [id number] DOB: 1945-12-08 Sex: female  REASON FOR VISIT: Follow-up peripheral vascular occlusive disease  HPI: [name] [name] is a 71 y.o. female patient of Dr. Darrick Penna. She is status post left iliac angioplasty and stenting. This was in 2015. She had had claudication symptoms. At that time arteriogram revealed superficial femoral artery occlusive disease as well. She had persistent claudication but was felt to be high risk for bypass and was having intolerable claudication and no limb threatening ischemia. She presents today for further evaluation. She has several components of her pain. She describes a typical night cramps in her calves reports this is severe. I explained that there is no relationship between this and peripheral vascular occlusive disease. She also has very typical bilateral lower extremity claudication in her calves and ankles. Reports that this is somewhat worse on her left leg and her right leg. She is morbidly obese and does try to walk when possible. She denies any arterial rest pain in his had no lower extremity nonhealing ulcerations or tissue loss. She did have nonhealing ulceration prior to her iliac angioplasty. She has a friend who had undergone bypass and had caused a concern with the patient that she was at risk for limb loss and need to see [place] for further evaluation.  Past Medical History:  Diagnosis Date  . Atrial fibrillation (HCC)   . Chronic systolic heart failure (HCC)   . Colon polyps    20 yrs ago  . Coronary atherosclerosis of native coronary artery    Multivessel, patent grafts 2007  . Diabetes mellitus, type 2 (HCC)   . Essential hypertension, benign   . Hyperlipidemia   . Hypothyroidism   . Hypothyroidism   . Ischemic cardiomyopathy    Ejection Fraction of 25% improved to 45-50% following biventicular pacing, Status post  biventricular dual-chamber ICD - [employer]  . Myocardial infarction (HCC)   . PVD (peripheral vascular disease) (HCC)   . Status cardiac pacemaker   . Ulcer     Family History  Problem Relation Age of Onset  . Diabetes Mother   . Ulcers Mother   . Heart disease Mother        before age 110  . Hyperlipidemia Mother   . Hypertension Mother   . Diabetes Father   . Heart attack Father   . Hyperlipidemia Father   . Hypertension Father   . Heart disease Father        before age 42  . AAA (abdominal aortic aneurysm) Father   . Diabetes Sister   . Heart disease Sister        before age 42  . Heart attack Sister   . Coronary artery disease Sister 69    SOCIAL HISTORY: Social History  Substance Use Topics  . Smoking status: Former Smoker    Quit date: 09/16/1991  . Smokeless tobacco: Never Used  . Alcohol use No    Allergies  Allergen Reactions  . Avandia [Rosiglitazone] Other (See Comments)    CHF  . Celebrex [Celecoxib] Other (See Comments)    Congestive heart failure  . Tape     Left rash per patient    Current Outpatient Prescriptions  Medication Sig Dispense Refill  . ALPRAZolam (XANAX) 1 MG tablet TAKE 1/2-1 TABLET 2 TIMES A DAY AS NEEDED FOR ANXIETY 60 tablet 5  . apixaban (ELIQUIS) 5 MG TABS tablet Take 1 tablet (  5 mg total) by mouth 2 (two) times daily. 180 tablet 3  . Blood Glucose Monitoring Suppl (ACCU-CHEK AVIVA) device Use as instructed 1 each 0  . carvedilol (COREG) 25 MG tablet TAKE ONE TABLET TWICE DAILY WITH A MEAL 180 tablet 3  . Cholecalciferol (VITAMIN D) 400 UNITS capsule Take 400 Units by mouth daily.    . Continuous Blood Gluc Sensor (FREESTYLE LIBRE SENSOR SYSTEM) MISC Use one sensor every 10 days. 3 each 2  . enalapril (VASOTEC) 10 MG tablet Take 1 tablet (10 mg total) by mouth 2 (two) times daily. 180 tablet 3  . furosemide (LASIX) 40 MG tablet Take 1 tablet (40 mg total) by mouth 2 (two) times daily. 180 tablet 3  . glucose  blood (ACCU-CHEK AVIVA) test strip Use to test glucose 4 times a day 150 each 5  . insulin lispro (HUMALOG KWIKPEN) 100 UNIT/ML KiwkPen Inject 0.22-0.28 mLs (22-28 Units total) into the skin 3 (three) times daily. 30 mL 2  . Insulin Pen Needle (B-D ULTRAFINE III SHORT PEN) 31G X 8 MM MISC Use 5 x daily as directed 150 each 5  . LEVEMIR FLEXTOUCH 100 UNIT/ML Pen INJECT 80 UNITS INTO THE SKIN DAILY AT 10:00 PM. 30 mL 2  . multivitamin (THERAGRAN) per tablet Take 1 tablet by mouth daily.      . Omega-3 Fatty Acids (FISH OIL) 1000 MG CAPS Take 1,000 mg by mouth daily.    Marland Kitchen OVER THE COUNTER MEDICATION Take 1 tablet by mouth daily. Phyomega vitamin.    . potassium chloride SA (K-DUR,KLOR-CON) 20 MEQ tablet TAKE ONE TABLET TWICE DAILY 60 tablet 6  . SYNTHROID 125 MCG tablet TAKE ONE TABLET ONCE DAILY 30 tablet 2  . traMADol (ULTRAM) 50 MG tablet TAKE ONE (1) TABLET EACH DAY AS NEEDED FOR MODERATE PAIN 30 tablet 5  . VICTOZA 18 MG/3ML SOPN INJECT 0.3ML (1.8MG  TOTAL) INTO THE SKINONCE DAILY 9 mL 3  . vitamin E 600 UNIT capsule Take 600 Units by mouth daily.       No current facility-administered medications for this visit.     REVIEW OF SYSTEMS:   denotes positive finding,  denotes negative finding Cardiac  Comments:  Chest pain or chest pressure:    Shortness of breath upon exertion:    Short of breath when lying flat:    Irregular heart rhythm:        Vascular    Pain in calf, thigh, or hip brought on by ambulation: x   Pain in feet at night that wakes you up from your sleep:  x Night cramps   Blood clot in your veins:    Leg swelling:           PHYSICAL EXAM: Vitals:   12/30/16 1422  BP: 125/75  Pulse: 77  Resp: 18  Temp: 97.8 F (36.6 C)  TempSrc: Oral  SpO2: 95%  Weight: 233 lb (105.7 kg)  Height:  (1.702 m)    GENERAL: The patient is a well-nourished female, in no acute distress. The vital signs are documented above. CARDIOVASCULAR: Palpable radial pulses  bilaterally. Soft right carotid bruit. I do not palpate popliteal or distal pulses. PULMONARY: There is good air exchange  MUSCULOSKELETAL: There are no major deformities or cyanosis. NEUROLOGIC: No focal weakness or paresthesias are detected. SKIN: There are no ulcers or rashes noted. PSYCHIATRIC: The patient has a normal affect.  DATA:  No current noninvasive studies.  Did review her last carotid duplex  from August 2016. 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 Explained that she does not have any evidence of limb threatening ischemia currently. I did explain the importance of walking program and also the importance of keeping a close eye on her feet and notifying us if she developed any ulcerations. I did recommend repeating her carotid duplex since it is been several years and she did have a moderate to severe right carotid stenosis. Fortunately she is remains asymptomatic. We will schedule this at her convenience next several weeks and she will see our nurse practitioner to discuss this. Assuming this is unremarkable will continue yearly carotid duplex follow-up    Larina Earthly, MD [facility] Tel [phone] Pager [phone]

## 2016-12-31 NOTE — Addendum Note (Signed)
Addended by: Burton Apley A on: 12/31/2016 03:27 PM   Modules accepted: Orders

## 2017-01-11 ENCOUNTER — Ambulatory Visit (HOSPITAL_COMMUNITY)
Admission: RE | Admit: 2017-01-11 | Discharge: 2017-01-11 | Disposition: A | Discharge status: Home / Self Care | Payer: Medicare Other | Source: Ambulatory Visit | Attending: Surgery | Admitting: Surgery

## 2017-01-11 ENCOUNTER — Encounter: Payer: Self-pay | Admitting: Family

## 2017-01-11 ENCOUNTER — Ambulatory Visit (INDEPENDENT_AMBULATORY_CARE_PROVIDER_SITE_OTHER): Payer: Medicare Other | Admitting: Family

## 2017-01-11 ENCOUNTER — Ambulatory Visit (INDEPENDENT_AMBULATORY_CARE_PROVIDER_SITE_OTHER)
Admission: RE | Admit: 2017-01-11 | Discharge: 2017-01-11 | Disposition: A | Discharge status: Home / Self Care | Payer: Medicare Other | Source: Ambulatory Visit | Attending: Surgery | Admitting: Surgery

## 2017-01-11 VITALS — BP 174/77 | HR 75 | Temp 98.6°F | Resp 20 | Ht 67.0 in | Wt 232.0 lb

## 2017-01-11 DIAGNOSIS — I779 Disorder of arteries and arterioles, unspecified: Secondary | ICD-10-CM

## 2017-01-11 DIAGNOSIS — I6529 Occlusion and stenosis of unspecified carotid artery: Secondary | ICD-10-CM | POA: Diagnosis not present

## 2017-01-11 DIAGNOSIS — I6523 Occlusion and stenosis of bilateral carotid arteries: Secondary | ICD-10-CM | POA: Diagnosis not present

## 2017-01-11 DIAGNOSIS — I739 Peripheral vascular disease, unspecified: Secondary | ICD-10-CM

## 2017-01-11 DIAGNOSIS — Z95828 Presence of other vascular implants and grafts: Secondary | ICD-10-CM | POA: Diagnosis not present

## 2017-01-11 LAB — VAS US CAROTID
LCCADSYS: 56 cm/s
LCCAPSYS: 51 cm/s
LICADDIAS: -36 cm/s
LICADSYS: -125 cm/s
Left CCA dist dias: 11 cm/s
Left CCA prox dias: 12 cm/s
Left ICA prox dias: -73 cm/s
Left ICA prox sys: -242 cm/s
RCCADSYS: -117 cm/s
RCCAPDIAS: 14 cm/s
RIGHT CCA MID DIAS: 25 cm/s
Right CCA prox sys: 73 cm/s

## 2017-01-11 NOTE — Patient Instructions (Signed)
Stroke Prevention Some medical conditions and behaviors are associated with an increased chance of having a stroke. You may prevent a stroke by making healthy choices and managing medical conditions. How can I reduce my risk of having a stroke?  Stay physically active. Get at least 30 minutes of activity on most or all days.  Do not smoke. It may also be helpful to avoid exposure to secondhand smoke.  Limit alcohol use. Moderate alcohol use is considered to be:  No more than 2 drinks per day for men.  No more than 1 drink per day for nonpregnant women.  Eat healthy foods. This involves:  Eating 5 or more servings of fruits and vegetables a day.  Making dietary changes that address high blood pressure (hypertension), high cholesterol, diabetes, or obesity.  Manage your cholesterol levels.  Making food choices that are high in fiber and low in saturated fat, trans fat, and cholesterol may control cholesterol levels.  Take any prescribed medicines to control cholesterol as directed by your health care provider.  Manage your diabetes.  Controlling your carbohydrate and sugar intake is recommended to manage diabetes.  Take any prescribed medicines to control diabetes as directed by your health care provider.  Control your hypertension.  Making food choices that are low in salt (sodium), saturated fat, trans fat, and cholesterol is recommended to manage hypertension.  Ask your health care provider if you need treatment to lower your blood pressure. Take any prescribed medicines to control hypertension as directed by your health care provider.  If you are 18-39 years of age, have your blood pressure checked every 3-5 years. If you are 40 years of age or older, have your blood pressure checked every year.  Maintain a healthy weight.  Reducing calorie intake and making food choices that are low in sodium, saturated fat, trans fat, and cholesterol are recommended to manage  weight.  Stop drug abuse.  Avoid taking birth control pills.  Talk to your health care provider about the risks of taking birth control pills if you are over 35 years old, smoke, get migraines, or have ever had a blood clot.  Get evaluated for sleep disorders (sleep apnea).  Talk to your health care provider about getting a sleep evaluation if you snore a lot or have excessive sleepiness.  Take medicines only as directed by your health care provider.  For some people, aspirin or blood thinners (anticoagulants) are helpful in reducing the risk of forming abnormal blood clots that can lead to stroke. If you have the irregular heart rhythm of atrial fibrillation, you should be on a blood thinner unless there is a good reason you cannot take them.  Understand all your medicine instructions.  Make sure that other conditions (such as anemia or atherosclerosis) are addressed. Get help right away if:  You have sudden weakness or numbness of the face, arm, or leg, especially on one side of the body.  Your face or eyelid droops to one side.  You have sudden confusion.  You have trouble speaking (aphasia) or understanding.  You have sudden trouble seeing in one or both eyes.  You have sudden trouble walking.  You have dizziness.  You have a loss of balance or coordination.  You have a sudden, severe headache with no known cause.  You have new chest pain or an irregular heartbeat. Any of these symptoms may represent a serious problem that is an emergency. Do not wait to see if the symptoms will go away.   Get medical help at once. Call your local emergency services (911 in [place] Do not drive yourself to the hospital.  This information is not intended to replace advice given to you by your health care provider. Make sure you discuss any questions you have with your health care provider. Document Released: 05/07/2004 Document Revised: 09/05/2015 Document Reviewed: 09/30/2012 Elsevier  Interactive Patient Education  2017 Elsevier Inc.      Peripheral Vascular Disease Peripheral vascular disease (PVD) is a disease of the blood vessels that are not part of your heart and brain. A simple term for PVD is poor circulation. In most cases, PVD narrows the blood vessels that carry blood from your heart to the rest of your body. This can result in a decreased supply of blood to your arms, legs, and internal organs, like your stomach or kidneys. However, it most often affects a person's lower legs and feet. There are two types of PVD.  Organic PVD. This is the more common type. It is caused by damage to the structure of blood vessels.  Functional PVD. This is caused by conditions that make blood vessels contract and tighten (spasm). Without treatment, PVD tends to get worse over time. PVD can also lead to acute ischemic limb. This is when an arm or limb suddenly has trouble getting enough blood. This is a medical emergency. Follow these instructions at home:  Take medicines only as told by your doctor.  Do not use any tobacco products, including cigarettes, chewing tobacco, or electronic cigarettes. If you need help quitting, ask your doctor.  Lose weight if you are overweight, and maintain a healthy weight as told by your doctor.  Eat a diet that is low in fat and cholesterol. If you need help, ask your doctor.  Exercise regularly. Ask your doctor for some good activities for you.  Take good care of your feet.  Wear comfortable shoes that fit well.  Check your feet often for any cuts or sores. Contact a doctor if:  You have cramps in your legs while walking.  You have leg pain when you are at rest.  You have coldness in a leg or foot.  Your skin changes.  You are unable to get or have an erection (erectile dysfunction).  You have cuts or sores on your feet that are not healing. Get help right away if:  Your arm or leg turns cold and blue.  Your arms or legs  become red, warm, swollen, painful, or numb.  You have chest pain or trouble breathing.  You suddenly have weakness in your face, arm, or leg.  You become very confused or you cannot speak.  You suddenly have a very bad headache.  You suddenly cannot see. This information is not intended to replace advice given to you by your health care provider. Make sure you discuss any questions you have with your health care provider. Document Released: 06/24/2009 Document Revised: 09/05/2015 Document Reviewed: 09/07/2013 Elsevier Interactive Patient Education  2017 Elsevier Inc.  

## 2017-01-11 NOTE — Progress Notes (Signed)
[facility] [place] HISTORY AND PHYSICAL   MRN : [id number]  History of Present Illness:   [name] [name] is a 71 y.o. female who is s/p arteriogram with left iliac artery stenting (April 2015) by Dr. Darrick Penna. 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  The patient had an ulceration on the lateral aspect of her left foot that healed with treatment by Dr. Leanord Hawking at the wound center. T She is on Eliquis. chronic medical problems include congestive heart failure, diabetes, hyperlipidemia, hypertension, ischemic cardiomyopathy with ICD.  All these problems are currently stable.    She also has known extracranial carotid artery stenosis.  She denies any hx of stroke or TIA.   After walking about 5 minutes, her ankles hurt, left more so than right. She is able to walk again after 3-4 minutes.  She denies non healing wounds on her feet or legs.   She states her blood pressure at home runs about 140-150/60-70.   Last serum creatinine result on file was 0.72 on 11-18-16.   Pt Diabetic: Yes, last A1C result on file was 8.6 on 11-18-16, states he A1C usually runs about 7.? Pt smoker: former smoker, quit in 1993   Pt meds include: Statin :No, states she takes a natural supplement instead "and it worksAgricultural consultant: yes ASA: No Other anticoagulants/antiplatelets: Eliquis, takes for atrial fib    Current Outpatient Prescriptions  Medication Sig Dispense Refill  . ALPRAZolam (XANAX) 1 MG tablet TAKE 1/2-1 TABLET 2 TIMES A DAY AS NEEDED FOR ANXIETY 60 tablet 5  . apixaban (ELIQUIS) 5 MG TABS tablet Take 1 tablet (5 mg total) by mouth 2 (two) times daily. 180 tablet 3  . Blood Glucose Monitoring Suppl (ACCU-CHEK AVIVA) device Use as instructed 1 each 0  . carvedilol (COREG) 25 MG tablet TAKE ONE TABLET TWICE DAILY WITH A MEAL 180 tablet 3  . Cholecalciferol (VITAMIN D) 400 UNITS  capsule Take 400 Units by mouth daily.    . Continuous Blood Gluc Sensor (FREESTYLE LIBRE SENSOR SYSTEM) MISC Use one sensor every 10 days. 3 each 2  . enalapril (VASOTEC) 10 MG tablet Take 1 tablet (10 mg total) by mouth 2 (two) times daily. 180 tablet 3  . furosemide (LASIX) 40 MG tablet Take 1 tablet (40 mg total) by mouth 2 (two) times daily. 180 tablet 3  . glucose blood (ACCU-CHEK AVIVA) test strip Use to test glucose 4 times a day 150 each 5  . insulin lispro (HUMALOG KWIKPEN) 100 UNIT/ML KiwkPen Inject 0.22-0.28 mLs (22-28 Units total) into the skin 3 (three) times daily. 30 mL 2  . Insulin Pen Needle (B-D ULTRAFINE III SHORT PEN) 31G X 8 MM MISC Use 5 x daily as directed 150 each 5  . LEVEMIR FLEXTOUCH 100 UNIT/ML Pen INJECT 80 UNITS INTO THE SKIN DAILY AT 10:00 PM. 30 mL 2  . multivitamin (THERAGRAN) per tablet Take 1 tablet by mouth daily.      . Omega-3 Fatty Acids (FISH OIL) 1000 MG CAPS Take 1,000 mg by mouth daily.    Marland Kitchen OVER THE COUNTER MEDICATION Take 1 tablet by mouth daily. Phyomega vitamin.    . potassium chloride SA (K-DUR,KLOR-CON) 20 MEQ tablet TAKE ONE TABLET TWICE DAILY 60 tablet 6  . SYNTHROID 125 MCG tablet TAKE ONE TABLET ONCE DAILY 30 tablet 2  . traMADol (ULTRAM) 50 MG tablet TAKE ONE (1) TABLET EACH DAY  AS NEEDED FOR MODERATE PAIN 30 tablet 5  . VICTOZA 18 MG/3ML SOPN INJECT 0.3ML (1.8MG  TOTAL) INTO THE SKINONCE DAILY 9 mL 3  . vitamin E 600 UNIT capsule Take 600 Units by mouth daily.       No current facility-administered medications for this visit.     Past Medical History:  Diagnosis Date  . Atrial fibrillation (HCC)   . Chronic systolic heart failure (HCC)   . Colon polyps    20 yrs ago  . Coronary atherosclerosis of native coronary artery    Multivessel, patent grafts 2007  . Diabetes mellitus, type 2 (HCC)   . Essential hypertension, benign   . Hyperlipidemia   . Hypothyroidism   . Hypothyroidism   . Ischemic cardiomyopathy    Ejection Fraction of  25% improved to 45-50% following biventicular pacing, Status post biventricular dual-chamber ICD - [employer]  . Myocardial infarction (HCC)   . PVD (peripheral vascular disease) (HCC)   . Status cardiac pacemaker   . Ulcer     Social History Social History  Substance Use Topics  . Smoking status: Former Smoker    Quit date: 09/16/1991  . Smokeless tobacco: Never Used  . Alcohol use No    Family History Family History  Problem Relation Age of Onset  . Diabetes Mother   . Ulcers Mother   . Heart disease Mother        before age 24  . Hyperlipidemia Mother   . Hypertension Mother   . Diabetes Father   . Heart attack Father   . Hyperlipidemia Father   . Hypertension Father   . Heart disease Father        before age 41  . AAA (abdominal aortic aneurysm) Father   . Diabetes Sister   . Heart disease Sister        before age 80  . Heart attack Sister   . Coronary artery disease Sister 57    Surgical History Past Surgical History:  Procedure Laterality Date  . ABDOMINAL AORTAGRAM N/A 08/04/2013   Procedure: ABDOMINAL Ronny Flurry;  Surgeon: Sherren Kerns, MD;  Location: [facility] C[facility] LAB;  Service: Cardiovascular;  Laterality: N/A;  . APPENDECTOMY    . BI-VENTRICULAR PACEMAKER INSERTION N/A 11/20/2013   Procedure: BI-VENTRICULAR PACEMAKER INSERTION (CRT-P);  Surgeon: Marinus Maw, MD;  Location: [facility] CATH LAB;  Service: Cardiovascular;  Laterality: N/A;  . Bilateral tubal ligation    . CARDIAC DEFIBRILLATOR PLACEMENT     [facility]  . CESAREAN SECTION    . CORONARY ARTERY BYPASS GRAFT  2001   [facility] - LIMA to LAD, SVG to ramus, SVG to OM/PLB, SVG to RCA   . FLEXIBLE SIGMOIDOSCOPY  07/19/1998   Dr. Karilyn Cota  . TONSILLECTOMY      Allergies  Allergen Reactions  . Avandia [Rosiglitazone] Other (See Comments)    CHF  . Celebrex [Celecoxib] Other (See Comments)    Congestive heart failure  . Tape     Left rash per patient    Current  Outpatient Prescriptions  Medication Sig Dispense Refill  . ALPRAZolam (XANAX) 1 MG tablet TAKE 1/2-1 TABLET 2 TIMES A DAY AS NEEDED FOR ANXIETY 60 tablet 5  . apixaban (ELIQUIS) 5 MG TABS tablet Take 1 tablet (5 mg total) by mouth 2 (two) times daily. 180 tablet 3  . Blood Glucose Monitoring Suppl (ACCU-CHEK AVIVA) device Use as instructed 1 each 0  . carvedilol (COREG) 25 MG tablet TAKE ONE  TABLET TWICE DAILY WITH A MEAL 180 tablet 3  . Cholecalciferol (VITAMIN D) 400 UNITS capsule Take 400 Units by mouth daily.    . Continuous Blood Gluc Sensor (FREESTYLE LIBRE SENSOR SYSTEM) MISC Use one sensor every 10 days. 3 each 2  . enalapril (VASOTEC) 10 MG tablet Take 1 tablet (10 mg total) by mouth 2 (two) times daily. 180 tablet 3  . furosemide (LASIX) 40 MG tablet Take 1 tablet (40 mg total) by mouth 2 (two) times daily. 180 tablet 3  . glucose blood (ACCU-CHEK AVIVA) test strip Use to test glucose 4 times a day 150 each 5  . insulin lispro (HUMALOG KWIKPEN) 100 UNIT/ML KiwkPen Inject 0.22-0.28 mLs (22-28 Units total) into the skin 3 (three) times daily. 30 mL 2  . Insulin Pen Needle (B-D ULTRAFINE III SHORT PEN) 31G X 8 MM MISC Use 5 x daily as directed 150 each 5  . LEVEMIR FLEXTOUCH 100 UNIT/ML Pen INJECT 80 UNITS INTO THE SKIN DAILY AT 10:00 PM. 30 mL 2  . multivitamin (THERAGRAN) per tablet Take 1 tablet by mouth daily.      . Omega-3 Fatty Acids (FISH OIL) 1000 MG CAPS Take 1,000 mg by mouth daily.    Marland Kitchen OVER THE COUNTER MEDICATION Take 1 tablet by mouth daily. Phyomega vitamin.    . potassium chloride SA (K-DUR,KLOR-CON) 20 MEQ tablet TAKE ONE TABLET TWICE DAILY 60 tablet 6  . SYNTHROID 125 MCG tablet TAKE ONE TABLET ONCE DAILY 30 tablet 2  . traMADol (ULTRAM) 50 MG tablet TAKE ONE (1) TABLET EACH DAY AS NEEDED FOR MODERATE PAIN 30 tablet 5  . VICTOZA 18 MG/3ML SOPN INJECT 0.3ML (1.8MG  TOTAL) INTO THE SKINONCE DAILY 9 mL 3  . vitamin E 600 UNIT capsule Take 600 Units by mouth daily.        No current facility-administered medications for this visit.      REVIEW OF SYSTEMS: See HPI for pertinent positives and negatives.  Physical Examination Vitals:   01/11/17 1528 01/11/17 1531  BP: (!) 160/77 (!) 174/77  Pulse: 75   Resp: 20   Temp: 98.6 F (37 C)   TempSrc: Oral   SpO2: 95%   Weight: 232 lb (105.2 kg)   Height:  (1.702 m)    Body mass index is 36.34 kg/m.  General:  WDWN in obese female NAD Gait: Normal HENT: WNL Eyes: PERRLA Pulmonary: normal non-labored breathing, limited air movement, few rales in both bases, no rhonchi or wheezing. Cardiac: RRR, no murmur detected  Abdomen: soft, NT, no masses palpated, large panus Skin: no rashes, no ulcers, no cellulitis.   VASCULAR EXAM  Carotid Bruits Right Left   Negative Negative      Radial pulses are 1+ palpable bilaterally   Adominal aortic pulse is not palpable                      VASCULAR EXAM: Extremities without ischemic changes, without Gangrene; without open wounds. Left second toe is surgically absent (pt states this got infected and had to be removed). Both feet and all toes of both feet are pink and warm with brisk capillary refill.  LE Pulses Right Left       FEMORAL  not palpable  not palpable        POPLITEAL  not palpable   not palpable       POSTERIOR TIBIAL  not palpable   not palpable        DORSALIS PEDIS      ANTERIOR TIBIAL not palpable  not palpable     Musculoskeletal: no muscle wasting or atrophy; no peripheral edema   Neurologic:  A&O X 3; appropriate affect, sensation is normal; speech is normal, CN 2-12 intact, pain and light touch intact in extremities, motor exam as listed above.    ASSESSMENT:  [name] [name] is a 71 y.o. female who is s/p arteriogram with left iliac artery stenting (April 2015) by Dr. Darrick Penna. The patient also has a left  superficial femoral artery and common femoral artery stenosis with below-knee popliteal artery reconstitution. She has claudication in both ankles after 5 minutes of walking, relieved by rest. There are no signs of ischemia in her feet or legs. Both feet, and all toes of both feet are pink and warm with brisk capillary refill.   She has no history of stroke or TIA.   Her atherosclerotic risk factors include uncontrolled DM, former smoker, and obesity.   I discussed with Dr. Myra Gianotti pt HPI and ABI results today. Pt states she is able to tolerate her sx's, and wants to try a graduated walking program.  See Plan.  DATA  Carotid Duplex (01/11/17): Bilateral ICA with 60-79% stenosis. Bilateral vertebral artery flow is antegrade.  Bilateral subclavian artery waveforms are normal.  Stenosis category has increased in the left ICA compared to the exam on 12-06-14.   ABI (Date: 01/11/2017):  R:   ABI: 0.00 (was 0.52 on 07-19-14),   PT: absent  DP: absent  TBI:  0.18  L:   ABI: 0.34 (was 0.48),   PT: mono  DP: mono  TBI: 0.18  Decline in bilateral ABI, absent waveforms in right PT and DP, monophasic waveforms in the left ankle. Bilateral TBI are 0.18.    PLAN:   Based on today's exam and non-invasive vascular lab results, the patient will follow up in 6 months with ABI's and carotid duplex, see Dr. Darrick Penna. I advised pt to notify us if she develops concerns re the circulation in her feet or legs.   I discussed in depth with the patient the nature of atherosclerosis, and emphasized the importance of maximal medical management including strict control of blood pressure, blood glucose, and lipid levels, obtaining regular exercise, and cessation of smoking.  The patient is aware that without maximal medical management the underlying atherosclerotic disease process will progress, limiting the benefit of any interventions.  The patient was given information about stroke prevention and  what symptoms should prompt the patient to seek immediate medical care.  The patient was given information about PAD including signs, symptoms, treatment, what symptoms should prompt the patient to seek immediate medical care, and risk reduction measures to take.  Thank you for allowing [place] to participate in this patient's care.  Charisse March, RN, MSN, FNP-C [facility] Office: [phone]  Clinic MD: Myra Gianotti 01/11/2017 3:38 PM

## 2017-01-12 NOTE — Addendum Note (Signed)
Addended by: Burton Apley A on: 01/12/2017 12:52 PM   Modules accepted: Orders

## 2017-02-08 ENCOUNTER — Other Ambulatory Visit: Payer: Self-pay | Admitting: "Endocrinology

## 2017-02-22 ENCOUNTER — Encounter: Payer: Medicare Other | Admitting: *Deleted

## 2017-02-22 ENCOUNTER — Telehealth: Payer: Self-pay | Admitting: Cardiology

## 2017-02-22 NOTE — Telephone Encounter (Signed)
Spoke with pt and reminded pt of remote transmission that is due today. Pt verbalized understanding.   

## 2017-02-25 ENCOUNTER — Encounter: Payer: Self-pay | Admitting: Cardiology

## 2017-03-10 ENCOUNTER — Other Ambulatory Visit: Payer: Self-pay | Admitting: Cardiology

## 2017-03-10 ENCOUNTER — Other Ambulatory Visit: Payer: Self-pay | Admitting: "Endocrinology

## 2017-03-17 ENCOUNTER — Encounter: Payer: Medicare Other | Admitting: Nurse Practitioner

## 2017-03-26 ENCOUNTER — Ambulatory Visit: Payer: Medicare Other | Admitting: "Endocrinology

## 2017-04-05 ENCOUNTER — Other Ambulatory Visit: Payer: Self-pay | Admitting: "Endocrinology

## 2017-04-16 ENCOUNTER — Ambulatory Visit: Payer: Medicare Other | Admitting: "Endocrinology

## 2017-04-26 ENCOUNTER — Ambulatory Visit (HOSPITAL_COMMUNITY): Payer: Medicare Other | Attending: Cardiology

## 2017-04-26 DIAGNOSIS — E038 Other specified hypothyroidism: Secondary | ICD-10-CM | POA: Diagnosis not present

## 2017-04-26 DIAGNOSIS — E1159 Type 2 diabetes mellitus with other circulatory complications: Secondary | ICD-10-CM | POA: Diagnosis not present

## 2017-04-27 LAB — HEMOGLOBIN A1C
Hgb A1c MFr Bld: 8.4 % of total Hgb — ABNORMAL HIGH (ref <5.7)
Mean Plasma Glucose: 194 (calc)
eAG (mmol/L): 10.8 (calc)

## 2017-04-27 LAB — RENAL FUNCTION PANEL
Albumin: 4.1 g/dL (ref 3.6–5.1)
BUN: 15 mg/dL (ref 7–25)
CALCIUM: 9.2 mg/dL (ref 8.6–10.4)
CO2: 28 mmol/L (ref 20–32)
Chloride: 105 mmol/L (ref 98–110)
Creat: 0.78 mg/dL (ref 0.50–0.99)
Glucose, Bld: 153 mg/dL — ABNORMAL HIGH (ref 65–139)
POTASSIUM: 4.6 mmol/L (ref 3.5–5.3)
Phosphorus: 3.2 mg/dL (ref 2.1–4.3)
Sodium: 141 mmol/L (ref 135–146)

## 2017-04-27 LAB — TSH: TSH: 1.76 mIU/L (ref 0.40–4.50)

## 2017-04-27 LAB — T4, FREE: Free T4: 1.2 ng/dL (ref 0.8–1.8)

## 2017-05-12 ENCOUNTER — Ambulatory Visit (INDEPENDENT_AMBULATORY_CARE_PROVIDER_SITE_OTHER): Payer: Medicare Other | Admitting: Nurse Practitioner

## 2017-05-12 ENCOUNTER — Encounter: Payer: Self-pay | Admitting: Nurse Practitioner

## 2017-05-12 ENCOUNTER — Telehealth: Payer: Self-pay | Admitting: Nurse Practitioner

## 2017-05-12 VITALS — BP 118/74 | Ht 67.0 in | Wt 235.4 lb

## 2017-05-12 DIAGNOSIS — I10 Essential (primary) hypertension: Secondary | ICD-10-CM

## 2017-05-12 DIAGNOSIS — Z1211 Encounter for screening for malignant neoplasm of colon: Secondary | ICD-10-CM

## 2017-05-12 DIAGNOSIS — Z1231 Encounter for screening mammogram for malignant neoplasm of breast: Secondary | ICD-10-CM

## 2017-05-12 DIAGNOSIS — Z780 Asymptomatic menopausal state: Secondary | ICD-10-CM

## 2017-05-12 DIAGNOSIS — Z0001 Encounter for general adult medical examination with abnormal findings: Secondary | ICD-10-CM | POA: Diagnosis not present

## 2017-05-12 DIAGNOSIS — E1159 Type 2 diabetes mellitus with other circulatory complications: Secondary | ICD-10-CM | POA: Diagnosis not present

## 2017-05-12 DIAGNOSIS — R0789 Other chest pain: Secondary | ICD-10-CM | POA: Diagnosis not present

## 2017-05-12 DIAGNOSIS — E13610 Other specified diabetes mellitus with diabetic neuropathic arthropathy: Secondary | ICD-10-CM | POA: Diagnosis not present

## 2017-05-12 DIAGNOSIS — Z01419 Encounter for gynecological examination (general) (routine) without abnormal findings: Secondary | ICD-10-CM

## 2017-05-12 DIAGNOSIS — Z13820 Encounter for screening for osteoporosis: Secondary | ICD-10-CM

## 2017-05-12 DIAGNOSIS — S2232XK Fracture of one rib, left side, subsequent encounter for fracture with nonunion: Secondary | ICD-10-CM

## 2017-05-12 NOTE — Telephone Encounter (Signed)
Pt asked at checkout for a referral back to Dr. Melvyn Novasrtmann ([facility]) for Dupuytren's contracture left pinky finger  States she "forgot" to mention during her OV with [name] this morning  Also states we referred her there before but she was unable to go back for her follow up due to inclement weather  Please initiate referral in system do that I may process

## 2017-05-12 NOTE — Patient Instructions (Signed)
Decrease Enalapril to once a day

## 2017-05-13 ENCOUNTER — Other Ambulatory Visit: Payer: Self-pay | Admitting: Nurse Practitioner

## 2017-05-13 DIAGNOSIS — M24542 Contracture, left hand: Secondary | ICD-10-CM

## 2017-05-13 NOTE — Telephone Encounter (Signed)
Done

## 2017-05-15 ENCOUNTER — Encounter: Payer: Self-pay | Admitting: Nurse Practitioner

## 2017-05-15 NOTE — Progress Notes (Signed)
Subjective:    Patient ID: [name] [name], female    DOB: June 19, 1945, 72 y.o.   MRN: [id number]  HPI presents for her wellness exam. BP running well outside of the office. Sees Dr. Fransico Him for diabetes. Enalapril was increased a few months ago to BID. Now having dizziness and decreased BP when she takes second dose. Has seen 2 podiatrists for her chronic feet issues but would like to see another provider. Is willing to get colonoscopy but only if she can do pill prep. Has chronic pain in the left lateral chest wall area after a fall a few months ago. Localized. Tries to stay as active as she can. Has dentures. No oral lesions. Regular eye exams.     Review of Systems  Constitutional: Negative for activity change, appetite change and fatigue.  HENT: Negative for ear pain, mouth sores, sinus pressure and sore throat.   Respiratory: Negative for cough, chest tightness, shortness of breath and wheezing.   Cardiovascular: Negative for chest pain.  Gastrointestinal: Negative for abdominal distention, abdominal pain, blood in stool, constipation, diarrhea, nausea and vomiting.  Genitourinary: Negative for difficulty urinating, dysuria, enuresis, frequency, genital sores, pelvic pain, urgency and vaginal discharge.   Depression screen [facility] 2/9 05/12/2017 12/24/2016 08/31/2016 06/01/2016 02/06/2016  Decreased Interest - 0 0 0 0  Down, Depressed, Hopeless 0 0 0 0 0  PHQ - 2 Score 0 0 0 0 0  Some recent data might be hidden        Objective:   Physical Exam  Constitutional: She is oriented to person, place, and time. She appears well-developed. No distress.  HENT:  Right Ear: External ear normal.  Left Ear: External ear normal.  Mouth/Throat: Oropharynx is clear and moist.  Neck: Normal range of motion. Neck supple. No tracheal deviation present. No thyromegaly present.  Cardiovascular: Normal rate, regular rhythm and normal heart sounds. Exam reveals no gallop.  No murmur heard. Pulmonary/Chest:  Effort normal and breath sounds normal. Right breast exhibits no inverted nipple, no mass, no skin change and no tenderness. Left breast exhibits no inverted nipple, no mass, no skin change and no tenderness. Breasts are symmetrical.  Axillae no adenopathy.   Abdominal: Soft. She exhibits no distension. There is no tenderness.  Genitourinary: Vagina normal and uterus normal. No vaginal discharge found.  Genitourinary Comments: External GU: no rashes or lesions. Skin dry and pale. Vagina: no discharge. Cervix: normal in appearance. Bimanual exam: no tenderness or obvious masses. Exam limited due to abd girth.   Musculoskeletal: She exhibits no edema.  Localized tenderness to palpation left lateral chest wall.   Lymphadenopathy:    She has no cervical adenopathy.  Neurological: She is alert and oriented to person, place, and time.  Skin: Skin is warm and dry. No rash noted.  Psychiatric: She has a normal mood and affect. Her behavior is normal.  Vitals reviewed.  Diabetic Foot Exam - Simple   Simple Foot Form Diabetic Foot exam was performed with the following findings:  Yes 05/12/2017 10:30 AM  Visual Inspection See comments:  Yes Sensation Testing See comments:  Yes Pulse Check See comments:  Yes Comments DP pulses present bilat. Toes warm with normal cap refill. Toe has been surgically removed left foot. Small superficial open wound noted third toe. No evidence of infection. Sensation diminished left foot as compared to right.         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 [facility](HCC)   Relevant Orders   Ambulatory referral to Podiatry     Musculoskeletal and Integument    Fracture of one rib, left side, subsequent encounter for fracture with nonunion    Other Visit Diagnoses    Well woman exam    -  Primary   Screening for osteoporosis       Relevant Orders   DG Bone Density   Post-menopausal       Relevant Orders   DG Bone Density   Visit for screening mammogram       Relevant Orders   MM DIGITAL SCREENING BILATERAL   Left-sided chest wall pain       Screen for colon cancer       Relevant Orders   Ambulatory referral to Gastroenterology      Urine microalbumin ordered. Given note to add Hep C screening to her next labs with Dr. Fransico HimNida. Referred to a different podiatrist.  Decrease Enalapril to once daily and continue to monitor BP. 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  Order chest xray to be done when she goes for mammogram.  Return in about 1 year (around 05/12/2018) for physical.

## 2017-05-19 ENCOUNTER — Ambulatory Visit (HOSPITAL_COMMUNITY): Payer: Medicare Other | Attending: Cardiology

## 2017-05-20 ENCOUNTER — Encounter: Payer: Self-pay | Admitting: Family Medicine

## 2017-05-20 ENCOUNTER — Ambulatory Visit (HOSPITAL_COMMUNITY): Payer: Medicare Other

## 2017-05-20 ENCOUNTER — Other Ambulatory Visit (HOSPITAL_COMMUNITY): Payer: Medicare Other

## 2017-05-21 ENCOUNTER — Encounter: Payer: Self-pay | Admitting: "Endocrinology

## 2017-05-21 ENCOUNTER — Ambulatory Visit (INDEPENDENT_AMBULATORY_CARE_PROVIDER_SITE_OTHER): Payer: Medicare Other | Admitting: "Endocrinology

## 2017-05-21 VITALS — BP 178/68 | HR 80 | Ht 67.0 in | Wt 231.0 lb

## 2017-05-21 DIAGNOSIS — I10 Essential (primary) hypertension: Secondary | ICD-10-CM | POA: Diagnosis not present

## 2017-05-21 DIAGNOSIS — E038 Other specified hypothyroidism: Secondary | ICD-10-CM

## 2017-05-21 DIAGNOSIS — E1159 Type 2 diabetes mellitus with other circulatory complications: Secondary | ICD-10-CM

## 2017-05-21 DIAGNOSIS — E782 Mixed hyperlipidemia: Secondary | ICD-10-CM

## 2017-05-21 NOTE — Progress Notes (Signed)
Subjective:    Patient ID: [name] [name], female    DOB: Jun 21, 1945, PCP Merlyn Albert, MD   Past Medical History:  Diagnosis Date  . Atrial fibrillation (HCC)   . Chronic systolic heart failure (HCC)   . Colon polyps    20 yrs ago  . Coronary atherosclerosis of native coronary artery    Multivessel, patent grafts 2007  . Diabetes mellitus, type 2 (HCC)   . Essential hypertension, benign   . Hyperlipidemia   . Hypothyroidism   . Hypothyroidism   . Ischemic cardiomyopathy    Ejection Fraction of 25% improved to 45-50% following biventicular pacing, Status post biventricular dual-chamber ICD - [employer]  . Myocardial infarction (HCC)   . PVD (peripheral vascular disease) (HCC)   . Status cardiac pacemaker   . Ulcer    Past Surgical History:  Procedure Laterality Date  . ABDOMINAL AORTAGRAM N/A 08/04/2013   Procedure: ABDOMINAL Ronny Flurry;  Surgeon: Sherren Kerns, MD;  Location: [facility] CATH LAB;  Service: Cardiovascular;  Laterality: N/A;  . APPENDECTOMY    . BI-VENTRICULAR PACEMAKER INSERTION N/A 11/20/2013   Procedure: BI-VENTRICULAR PACEMAKER INSERTION (CRT-P);  Surgeon: Marinus Maw, MD;  Location: [facility] CATH LAB;  Service: Cardiovascular;  Laterality: N/A;  . Bilateral tubal ligation    . CARDIAC DEFIBRILLATOR PLACEMENT     [employer] [place]  . CESAREAN SECTION    . CORONARY ARTERY BYPASS GRAFT  2001   [facility] - [place] to LAD, SVG to ramus, SVG to OM/PLB, SVG to RCA   . FLEXIBLE SIGMOIDOSCOPY  07/19/1998   Dr. Karilyn Cota  . TONSILLECTOMY     Social History   Socioeconomic History  . Marital status: Divorced    Spouse name: None  . Number of children: 1  . Years of education: None  . Highest education level: None  Social Needs  . Financial resource strain: None  . Food insecurity - worry: None  . Food insecurity - inability: None  . Transportation needs - medical: None  . Transportation needs - non-medical: None  Occupational History   . Occupation: Full Time: [employer]  . Occupation: Disabled    Employer: NOT EMPLOYED  Tobacco Use  . Smoking status: Former Smoker    Last attempt to quit: 09/16/1991    Years since quitting: 25.6  . Smokeless tobacco: Never Used  Substance and Sexual Activity  . Alcohol use: No    Alcohol/week: 0.0 oz  . Drug use: No  . Sexual activity: No    Birth control/protection: Post-menopausal  Other Topics Concern  . None  Social History Narrative  . None   Outpatient Encounter Medications as of 05/21/2017  Medication Sig  . ALPRAZolam (XANAX) 1 MG tablet TAKE 1/2-1 TABLET 2 TIMES A DAY AS NEEDED FOR ANXIETY  . apixaban (ELIQUIS) 5 MG TABS tablet Take 1 tablet (5 mg total) by mouth 2 (two) times daily.  . Blood Glucose Monitoring Suppl (ACCU-CHEK AVIVA) device Use as instructed  . carvedilol (COREG) 25 MG tablet TAKE ONE TABLET TWICE DAILY WITH A MEAL  . Cholecalciferol (VITAMIN D) 400 UNITS capsule Take 400 Units by mouth daily.  . Continuous Blood Gluc Sensor (FREESTYLE LIBRE SENSOR SYSTEM) MISC Use one sensor every 10 days.  . enalapril (VASOTEC) 10 MG tablet Take 1 tablet (10 mg total) by mouth 2 (two) times daily. (Patient taking differently: Take 10 mg by mouth daily. )  . furosemide (LASIX) 40 MG tablet Take 1  tablet (40 mg total) by mouth 2 (two) times daily.  Marland Kitchen. glucose blood (ACCU-CHEK AVIVA) test strip Use to test glucose 4 times a day  . insulin lispro (HUMALOG KWIKPEN) 100 UNIT/ML KiwkPen Inject 0.22-0.28 mLs (22-28 Units total) into the skin 3 (three) times daily.  . insulin lispro (HUMALOG KWIKPEN) 100 UNIT/ML KiwkPen Inject 0.22-0.28 mLs (22-28 Units total) into the skin 3 (three) times daily.  . Insulin Pen Needle (B-D ULTRAFINE III SHORT PEN) 31G X 8 MM MISC Use 5 x daily as directed  . LEVEMIR FLEXTOUCH 100 UNIT/ML Pen INJECT 80 UNITS INTO THE SKIN DAILY AT 10:00PM  . multivitamin (THERAGRAN) per tablet Take 1 tablet by mouth daily.    . Omega-3 Fatty Acids  (FISH OIL) 1000 MG CAPS Take 1,000 mg by mouth daily.  Marland Kitchen. OVER THE COUNTER MEDICATION Take 1 tablet by mouth daily. Phyomega vitamin.  . potassium chloride SA (K-DUR,KLOR-CON) 20 MEQ tablet TAKE ONE TABLET TWICE DAILY  . SYNTHROID 125 MCG tablet TAKE ONE TABLET ONCE DAILY  . traMADol (ULTRAM) 50 MG tablet TAKE ONE (1) TABLET EACH DAY AS NEEDED FOR MODERATE PAIN  . VICTOZA 18 MG/3ML SOPN INJECT 0.3ML'S (1.8MG  TOTAL) INTO THE SKIN ONCE DAILY  . vitamin E 600 UNIT capsule Take 600 Units by mouth daily.     No facility-administered encounter medications on file as of 05/21/2017.    ALLERGIES: Allergies  Allergen Reactions  . Avandia [Rosiglitazone] Other (See Comments)    CHF  . Celebrex [Celecoxib] Other (See Comments)    Congestive heart failure  . Tape     Left rash per patient   VACCINATION STATUS: Immunization History  Administered Date(s) Administered  . Influenza,inj,Quad PF,6+ Mos 01/10/2013, 01/01/2014, 12/13/2014, 12/18/2015, 12/15/2016  . Influenza-Unspecified 01/11/2012  . Pneumococcal Conjugate-13 11/13/2013  . Pneumococcal Polysaccharide-23 08/02/2012  . Zoster 11/25/2009    Diabetes  She presents for her follow-up diabetic visit. She has type 2 diabetes mellitus. Onset time: She was diagnosed at approximate age of 34 years. Her disease course has been worsening. There are no hypoglycemic associated symptoms. Pertinent negatives for hypoglycemia include no confusion, headaches, pallor or seizures. There are no diabetic associated symptoms. Pertinent negatives for diabetes include no chest pain, no polydipsia, no polyphagia and no polyuria. There are no hypoglycemic complications. Symptoms are improving. Diabetic complications include heart disease. Risk factors for coronary artery disease include diabetes mellitus, dyslipidemia, hypertension, obesity, sedentary lifestyle and tobacco exposure. She is compliant with treatment most of the time. Her weight is stable. She is  following a generally unhealthy diet. She has had a previous visit with a dietitian. Her breakfast blood glucose range is generally 180-200 mg/dl. Her lunch blood glucose range is generally 180-200 mg/dl. Her dinner blood glucose range is generally 180-200 mg/dl. Her overall blood glucose range is 180-200 mg/dl. An ACE inhibitor/angiotensin II receptor blocker is being taken.  Hyperlipidemia  This is a chronic problem. The current episode started more than 1 year ago. The problem is uncontrolled. Exacerbating diseases include diabetes, hypothyroidism and obesity. Pertinent negatives include no chest pain, myalgias or shortness of breath. Current antihyperlipidemic treatment includes statins. Risk factors for coronary artery disease include dyslipidemia, diabetes mellitus, hypertension, a sedentary lifestyle and post-menopausal.  Hypertension  This is a chronic problem. The current episode started more than 1 year ago. The problem is uncontrolled. Pertinent negatives include no chest pain, headaches, palpitations or shortness of breath. Risk factors for coronary artery disease include dyslipidemia, diabetes mellitus, sedentary lifestyle, obesity  and smoking/tobacco exposure. Past treatments include ACE inhibitors. Compliance problems include diet.  Hypertensive end-organ damage includes CAD/MI. Identifiable causes of hypertension include a thyroid problem.  Thyroid Problem  Presents for follow-up visit. Patient reports no cold intolerance, diarrhea, heat intolerance or palpitations. The symptoms have been improving. Past treatments include levothyroxine. Her past medical history is significant for diabetes and hyperlipidemia.    Review of Systems  Constitutional: Negative for chills, fever and unexpected weight change.  HENT: Negative for trouble swallowing and voice change.   Eyes: Negative for visual disturbance.  Respiratory: Negative for cough, shortness of breath and wheezing.   Cardiovascular:  Negative for chest pain, palpitations and leg swelling.  Gastrointestinal: Negative for diarrhea, nausea and vomiting.  Endocrine: Negative for cold intolerance, heat intolerance, polydipsia, polyphagia and polyuria.  Musculoskeletal: Negative for arthralgias and myalgias.  Skin: Negative for color change, pallor, rash and wound.  Neurological: Negative for seizures and headaches.  Psychiatric/Behavioral: Negative for confusion and suicidal ideas.    Objective:    BP (!) 178/68   Pulse 80   Ht 5\' 7"  (1.702 m)   Wt 231 lb (104.8 kg)   LMP  (LMP Unknown)   SpO2 99%   BMI 36.18 kg/m   Wt Readings from Last 3 Encounters:  05/21/17 231 lb (104.8 kg)  05/12/17 235 lb 6.4 oz (106.8 kg)  01/11/17 232 lb (105.2 kg)    Physical Exam  Constitutional: She is oriented to person, place, and time. She appears well-developed.  HENT:  Head: Normocephalic and atraumatic.  Eyes: EOM are normal.  Neck: Normal range of motion. Neck supple. No tracheal deviation present. Thyromegaly present.  Cardiovascular: Normal rate and regular rhythm.  Pulmonary/Chest: Effort normal and breath sounds normal.  Abdominal: Soft. Bowel sounds are normal. There is no tenderness. There is no guarding.  Musculoskeletal: Normal range of motion. She exhibits no edema.  Neurological: She is alert and oriented to person, place, and time. She has normal reflexes. No cranial nerve deficit. Coordination normal.  Skin: Skin is warm and dry. No rash noted. No erythema. No pallor.  Psychiatric: She has a normal mood and affect. Judgment normal.   CMP ( most recent) CMP     Component Value Date/Time   NA 141 04/26/2017 0914   K 4.6 04/26/2017 0914   CL 105 04/26/2017 0914   CO2 28 04/26/2017 0914   GLUCOSE 153 (H) 04/26/2017 0914   BUN 15 04/26/2017 0914   CREATININE 0.78 04/26/2017 0914   CALCIUM 9.2 04/26/2017 0914   PROT 7.0 11/18/2016 0956   ALBUMIN 3.9 11/18/2016 0956   AST 16 11/18/2016 0956   ALT 15  11/18/2016 0956   ALKPHOS 47 11/18/2016 0956   BILITOT 0.7 11/18/2016 0956   GFRNONAA >89 01/29/2016 0919   GFRAA >89 01/29/2016 0919    Diabetic Labs (most recent): Lab Results  Component Value Date   HGBA1C 8.4 (H) 04/26/2017   HGBA1C 8.6 (H) 11/18/2016   HGBA1C 8.5 (H) 08/28/2016     Lipid Panel ( most recent) Lipid Panel     Component Value Date/Time   CHOL 169 11/18/2016 1000   TRIG 127 11/18/2016 1000   HDL 31 (L) 11/18/2016 1000   CHOLHDL 5.5 (H) 11/18/2016 1000   VLDL 25 11/18/2016 1000   LDLCALC 113 (H) 11/18/2016 1000   LDLDIRECT 60.5 07/31/2008 1508     Assessment & Plan:   1. Type 2 diabetes mellitus with vascular disease (HCC) -Her diabetes is  complicated  by coronary artery disease and patient remains at a high risk for more acute and chronic complications of diabetes which include CAD, CVA, CKD, retinopathy, and neuropathy. These are all discussed in detail with the patient.  Patient came with above target  glucose profile. She admits to dietary indiscretion however she promises to do better. Her A1c remains high at 8.4%    Glucose logs and insulin administration records pertaining to this visit,  to be scanned into patient's records.  Recent labs reviewed.   - I have re-counseled the patient on diet management and weight loss  by adopting a carbohydrate restricted / protein rich  Diet.  -  Suggestion is made for her to avoid simple carbohydrates  from her diet including Cakes, Sweet Desserts / Pastries, Ice Cream, Soda (diet and regular), Sweet Tea, Candies, Chips, Cookies, Store Bought Juices, Alcohol in Excess of  1-2 drinks a day, Artificial Sweeteners, and "Sugar-free" Products. This will help patient to have stable blood glucose profile and potentially avoid unintended weight gain.  - Patient is advised to stick to a routine mealtimes to eat 3 meals  a day and avoid unnecessary snacks ( to snack only to correct hypoglycemia).  - I have approached  patient with the following individualized plan to manage diabetes and patient agrees.  -She wishes to avoid further tightening of glycemia.  -  I will continue with  Levemir 80 units daily at bedtime, and  Humalog  22 units 3 times a day before meals for pre-meal blood glucose  readings of 90-150mg /dl, plus patient specific correction dose of rapid acting insulin  for unexpected hyperglycemia above 150mg /dl, associated with strict monitoring of glucose 4 times a day-before meals and at bedtime. - Patient is warned not to take insulin without proper monitoring per orders. -Adjustment parameters are given for hypo and hyperglycemia in writing. -Patient is encouraged to call clinic for blood glucose levels less than 70 or above 300 mg /dl. - I will continue Victoza 1.8 mg subcutaneously daily, therapeutically suitable for patient.  - Patient specific target  for A1c; LDL, HDL, Triglycerides, and  Waist Circumference were discussed in detail.  2) BP/HTN: Her blood pressure is not controlled to target.  I have advised her to continue her current blood pressure medications including  ACEI/ARB. 3) Lipids/HPL:    uncontrolled with LDL 113, she is advised to continue taking simvastatin.   4)  Weight/Diet: CDE consult in progress, exercise, and carbohydrates information provided.  5) hypothyroidism:  -Her thyroid function tests are consistent with appropriate replacement.  -I advised her to continue levothyroxine 125 mcg p.o. every morning.    - We discussed about correct intake of levothyroxine, at fasting, with water, separated by at least 30 minutes from breakfast, and separated by more than 4 hours from calcium, iron, multivitamins, acid reflux medications (PPIs). -Patient is made aware of the fact that thyroid hormone replacement is needed for life, dose to be adjusted by periodic monitoring of thyroid function tests.    -  her repeat thyroid/neck ultrasound shows stable 8 mm nodule on the right  lower her thyroid. She will not need any intervention or follow-up for this.   6) Chronic Care/Health Maintenance:  -Patient is on ACEI/ARB and Statin medications and encouraged to continue to follow up with Ophthalmology, Podiatrist at least yearly or according to recommendations, and advised to  stay away from smoking. I have recommended yearly flu vaccine and pneumonia vaccination at least every 5 years; moderate intensity  exercise for up to 150 minutes weekly; and  sleep for at least 7 hours a day.  - Time spent with the patient: 25 min, of which >50% was spent in reviewing her blood glucose logs , discussing her hypo- and hyper-glycemic episodes, reviewing her current and  previous labs and insulin doses and developing a plan to avoid hypo- and hyper-glycemia. Please refer to Patient Instructions for Blood Glucose Monitoring and Insulin/Medications Dosing Guide"  in media tab for additional information.   - I advised patient to maintain close follow up with Merlyn Albert, MD for primary care needs.  Follow up plan: -Return in about 3 months (around 08/18/2017) for meter, and logs.  Marquis Lunch, MD Phone: [phone]  Fax: [phone]  This note was partially dictated with voice recognition software. Similar sounding words can be transcribed inadequately or may not  be corrected upon review.  05/21/2017, 1:54 PM

## 2017-05-21 NOTE — Patient Instructions (Signed)

## 2017-05-31 DIAGNOSIS — E113553 Type 2 diabetes mellitus with stable proliferative diabetic retinopathy, bilateral: Secondary | ICD-10-CM | POA: Diagnosis not present

## 2017-05-31 DIAGNOSIS — H35372 Puckering of macula, left eye: Secondary | ICD-10-CM | POA: Diagnosis not present

## 2017-06-01 LAB — HM DIABETES EYE EXAM

## 2017-06-07 ENCOUNTER — Encounter: Payer: Self-pay | Admitting: *Deleted

## 2017-06-09 ENCOUNTER — Ambulatory Visit (INDEPENDENT_AMBULATORY_CARE_PROVIDER_SITE_OTHER): Payer: Medicare Other

## 2017-06-09 ENCOUNTER — Ambulatory Visit (INDEPENDENT_AMBULATORY_CARE_PROVIDER_SITE_OTHER): Payer: Medicare Other | Admitting: Podiatry

## 2017-06-09 DIAGNOSIS — E1140 Type 2 diabetes mellitus with diabetic neuropathy, unspecified: Secondary | ICD-10-CM

## 2017-06-09 DIAGNOSIS — E13610 Other specified diabetes mellitus with diabetic neuropathic arthropathy: Secondary | ICD-10-CM

## 2017-06-09 DIAGNOSIS — E1149 Type 2 diabetes mellitus with other diabetic neurological complication: Secondary | ICD-10-CM | POA: Diagnosis not present

## 2017-06-09 NOTE — Progress Notes (Signed)
Cardiology Office Note  Date: 06/10/2017   ID: [name] [name], DOB 1945-10-28, MRN [id number]  PCP: Mikey Kirschner, MD  Primary Cardiologist: Rozann Lesches, MD   Chief Complaint  Patient presents with  . Cardiomyopathy    History of Present Illness: [name] [name] is a 72 y.o. female last seen in August 2018.  She presents for a routine follow-up visit.  Since last assessment she does not report any exertional chest pain or increasing shortness of breath.  She is using a cane, denies any falls or bleeding problems on Eliquis.  She continues to follow in the device clinic with Dr. Lovena Le, [place] biventricular pacemaker in place.  She has not had a recent device interrogation, states that she had a problem with her remote monitoring device but that it has been corrected.  I reviewed her medications.  Current cardiac regimen includes Eliquis, Coreg, Vasotec, Lasix, potassium supplements, and omega-3 supplements.  I reviewed her recent lab work.  I personally reviewed her ECG today which shows a ventricular paced rhythm with PVC.  Past Medical History:  Diagnosis Date  . Atrial fibrillation ([place])   . Chronic systolic heart failure ([place])   . Colon polyps    20 yrs ago  . Coronary atherosclerosis of native coronary artery    Multivessel, patent grafts 2007  . Diabetes mellitus, type 2 ([place])   . Essential hypertension, benign   . Hyperlipidemia   . Hypothyroidism   . Hypothyroidism   . Ischemic cardiomyopathy    Ejection Fraction of 25% improved to 45-50% following biventicular pacing, Status post biventricular dual-chamber ICD - [employer]  . Myocardial infarction ([place])   . PVD (peripheral vascular disease) ([place])   . Status cardiac pacemaker   . Ulcer     Past Surgical History:  Procedure Laterality Date  . ABDOMINAL AORTAGRAM N/A 08/04/2013   Procedure: ABDOMINAL Maxcine Ham;  Surgeon: Elam Dutch, MD;  Location: [facility] CATH LAB;   Service: Cardiovascular;  Laterality: N/A;  . APPENDECTOMY    . BI-VENTRICULAR PACEMAKER INSERTION N/A 11/20/2013   Procedure: BI-VENTRICULAR PACEMAKER INSERTION (CRT-P);  Surgeon: Evans Lance, MD;  Location: [facility] CATH LAB;  Service: Cardiovascular;  Laterality: N/A;  . Bilateral tubal ligation    . CARDIAC DEFIBRILLATOR PLACEMENT     [employer] [place]  . CESAREAN SECTION    . CORONARY ARTERY BYPASS GRAFT  2001   [place] - LIMA to LAD, SVG to ramus, SVG to OM/PLB, SVG to RCA   . FLEXIBLE SIGMOIDOSCOPY  07/19/1998   Dr. Laural Golden  . TONSILLECTOMY      Current Outpatient Medications  Medication Sig Dispense Refill  . ALPRAZolam (XANAX) 1 MG tablet TAKE 1/2-1 TABLET 2 TIMES A DAY AS NEEDED FOR ANXIETY 60 tablet 5  . apixaban (ELIQUIS) 5 MG TABS tablet Take 1 tablet (5 mg total) by mouth 2 (two) times daily. 180 tablet 3  . Blood Glucose Monitoring Suppl (ACCU-CHEK AVIVA) device Use as instructed 1 each 0  . carvedilol (COREG) 25 MG tablet TAKE ONE TABLET TWICE DAILY WITH A MEAL 180 tablet 3  . Cholecalciferol (VITAMIN D) 400 UNITS capsule Take 400 Units by mouth daily.    . Continuous Blood Gluc Sensor (FREESTYLE LIBRE SENSOR SYSTEM) MISC Use one sensor every 10 days. 3 each 2  . enalapril (VASOTEC) 10 MG tablet Take 1 tablet (10 mg total) by mouth 2 (two) times daily. (Patient taking differently: Take 10 mg by mouth daily. )  180 tablet 3  . furosemide (LASIX) 40 MG tablet Take 1 tablet (40 mg total) by mouth 2 (two) times daily. 180 tablet 3  . glucose blood (ACCU-CHEK AVIVA) test strip Use to test glucose 4 times a day 150 each 5  . insulin lispro (HUMALOG KWIKPEN) 100 UNIT/ML KiwkPen Inject 0.22-0.28 mLs (22-28 Units total) into the skin 3 (three) times daily. 30 mL 2  . insulin lispro (HUMALOG KWIKPEN) 100 UNIT/ML KiwkPen Inject 0.22-0.28 mLs (22-28 Units total) into the skin 3 (three) times daily. 30 mL 2  . Insulin Pen Needle (B-D ULTRAFINE III SHORT PEN) 31G X 8 MM MISC Use 5 x  daily as directed 150 each 5  . LEVEMIR FLEXTOUCH 100 UNIT/ML Pen INJECT 80 UNITS INTO THE SKIN DAILY AT 10:00PM 30 mL 2  . multivitamin (THERAGRAN) per tablet Take 1 tablet by mouth daily.      . Omega-3 Fatty Acids (FISH OIL) 1000 MG CAPS Take 1,000 mg by mouth daily.    Marland Kitchen OVER THE COUNTER MEDICATION Take 1 tablet by mouth daily. Phyomega vitamin.    . potassium chloride SA (K-DUR,KLOR-CON) 20 MEQ tablet TAKE ONE TABLET TWICE DAILY 60 tablet 6  . SYNTHROID 125 MCG tablet TAKE ONE TABLET ONCE DAILY 30 tablet 2  . traMADol (ULTRAM) 50 MG tablet TAKE ONE (1) TABLET EACH DAY AS NEEDED FOR MODERATE PAIN 30 tablet 5  . VICTOZA 18 MG/3ML SOPN INJECT 0.3ML'S (1.8MG TOTAL) INTO THE SKIN ONCE DAILY 9 mL 2  . vitamin E 600 UNIT capsule Take 600 Units by mouth daily.       No current facility-administered medications for this visit.    Allergies:  Avandia [rosiglitazone]; Celebrex [celecoxib]; and Tape   Social History: The patient  reports that she quit smoking about 25 years ago. she has never used smokeless tobacco. She reports that she does not drink alcohol or use drugs.   ROS:  Please see the history of present illness. Otherwise, complete review of systems is positive for arthritic pains in her legs and feet.  All other systems are reviewed and negative.   Physical Exam: VS:  BP 138/78 (BP Location: Left Arm)   Pulse 78   Ht _0  (1.702 m)   Wt 234 lb (106.1 kg)   LMP  (LMP Unknown)   SpO2 98%   BMI 36.65 kg/m , BMI Body mass index is 36.65 kg/m.  Wt Readings from Last 3 Encounters:  06/10/17 234 lb (106.1 kg)  05/21/17 231 lb (104.8 kg)  05/12/17 235 lb 6.4 oz (106.8 kg)    General: Patient appears comfortable at rest.  Uses a cane. HEENT: Conjunctiva and lids normal, oropharynx clear. Neck: Supple, no elevated JVP or carotid bruits, no thyromegaly. Lungs: Clear to auscultation, nonlabored breathing at rest. Cardiac: Regular rate and rhythm, no S3 or significant systolic  murmur. Abdomen: Soft, nontender, bowel sounds present. Extremities: Mild ankle edema, distal pulses 1-2+. Skin: Warm and dry. Musculoskeletal: No kyphosis. Neuropsychiatric: Alert and oriented x3, affect grossly appropriate.  ECG: I personally reviewed the tracing from 05/25/2016 which showed a ventricular paced rhythm with PVC.  Recent Labwork: 11/18/2016: ALT 15; AST 16 04/26/2017: BUN 15; Creat 0.78; Potassium 4.6; Sodium 141; TSH 1.76     Component Value Date/Time   CHOL 169 11/18/2016 1000   TRIG 127 11/18/2016 1000   HDL 31 (L) 11/18/2016 1000   CHOLHDL 5.5 (H) 11/18/2016 1000   VLDL 25 11/18/2016 1000   LDLCALC 113 (H)  11/18/2016 1000   LDLDIRECT 60.5 07/31/2008 1508    Other Studies Reviewed Today:  Echocardiogram 10/29/2015: Study Conclusions  - Left ventricle: The cavity size was mildly dilated. Wall thickness was increased in a pattern of mild LVH. Systolic function was mildly to moderately reduced. The estimated ejection fraction was in the range of 40% to 45%. Diffuse hypokinesis. There is akinesis of the mid-apicalanteroseptal myocardium. There is severe hypokinesis of the basalinferior myocardium. Doppler parameters are consistent with restrictive physiology, indicative of decreased left ventricular diastolic compliance and/or increased left atrial pressure. - Aortic valve: Moderately calcified annulus. Trileaflet; mildly calcified leaflets. Moderate thickening and calcification involving the noncoronary cusp. Noncoronary cusp mobility was restricted. There was mild stenosis. There was trivial regurgitation. Mean gradient (S): 10 mm Hg. Peak gradient (S): 18 mm Hg. VTI ratio of LVOT to aortic valve: 0.53. - Mitral valve: Calcified annulus. There was mild regurgitation. - Left atrium: The atrium was moderately dilated. - Right ventricle: Pacer wire or catheter noted in right ventricle. Systolic function was low normal. - Right  atrium: The atrium was at the upper limits of normal in size. - Tricuspid valve: There was mild regurgitation. - Pulmonary arteries: PA peak pressure: 44 mm Hg (S). - Pericardium, extracardiac: There was no pericardial effusion.  Impressions:  - Mild LV chamber dilatation with mild LVH and LVEF 40-45%. Diffuse hypokinesis with wall motion abnormalities consistent with ischemic cardiomyopathy. Restrictive diastolic filling pattern with increased LV filling pressure. Moderate left atrial enlargement. MAC with mild mitral regurgitation. Mild calcific aortic stenosis with trivial aortic regurgitation. Low normal RV contraction with device wire present. Mild tricuspid regurgitation with PASP 44 mmHg.  Carotid Dopplers 01/11/2017:  Bilateral 60-79% ICA stenoses.  Assessment and Plan:  1.  Ischemic cardiomyopathy with LVEF 40-45%.  She has not followed up for repeat echocardiogram as yet, this will be rescheduled.  Otherwise plan to continue current medical regimen.  2.  [employer] biventricular pacemaker in place with follow-up per Dr. Lovena Le.  Nursing to contact device clinic regarding remote follow-up.  3.  Paroxysmal atrial fibrillation.  She does not report any palpitations and continues on Coreg along with Eliquis.  I reviewed her recent lab work.  4.  Bilateral carotid artery disease, continues to follow with [facility].  Carotid Dopplers from October 2018 are outlined above.  Current medicines were reviewed with the patient today.   Orders Placed This Encounter  Procedures  . EKG 12-Lead    Disposition: Follow-up in 6 months.  Signed, Satira Sark, MD, [facility] 06/10/2017 9:57 AM    [place] [facility] [place]. [address] [place], [place] [address] Phone: [phone]; Fax: [phone]

## 2017-06-10 ENCOUNTER — Other Ambulatory Visit: Payer: Self-pay | Admitting: Family Medicine

## 2017-06-10 ENCOUNTER — Other Ambulatory Visit: Payer: Self-pay

## 2017-06-10 ENCOUNTER — Encounter: Payer: Self-pay | Admitting: Cardiology

## 2017-06-10 ENCOUNTER — Ambulatory Visit (INDEPENDENT_AMBULATORY_CARE_PROVIDER_SITE_OTHER): Payer: Medicare Other | Admitting: Cardiology

## 2017-06-10 VITALS — BP 138/78 | HR 78 | Ht 67.0 in | Wt 234.0 lb

## 2017-06-10 DIAGNOSIS — I4819 Other persistent atrial fibrillation: Secondary | ICD-10-CM

## 2017-06-10 DIAGNOSIS — I6523 Occlusion and stenosis of bilateral carotid arteries: Secondary | ICD-10-CM

## 2017-06-10 DIAGNOSIS — I481 Persistent atrial fibrillation: Secondary | ICD-10-CM | POA: Diagnosis not present

## 2017-06-10 DIAGNOSIS — I255 Ischemic cardiomyopathy: Secondary | ICD-10-CM | POA: Diagnosis not present

## 2017-06-10 DIAGNOSIS — Z950 Presence of cardiac pacemaker: Secondary | ICD-10-CM | POA: Diagnosis not present

## 2017-06-10 MED ORDER — POTASSIUM CHLORIDE CRYS ER 20 MEQ PO TBCR: 20.0000 meq | EXTENDED_RELEASE_TABLET | Freq: Two times a day (BID) | ORAL | 3 refills | Status: AC

## 2017-06-10 NOTE — Telephone Encounter (Signed)
Ok plus five monthly ref 

## 2017-06-10 NOTE — Telephone Encounter (Signed)
refilled 90 day refill for potassium

## 2017-06-10 NOTE — Patient Instructions (Signed)
Your physician wants you to follow-up in: 6 months with Dr.McDowell You will receive a reminder letter in the mail two months in advance. If you don't receive a letter, please call our office to schedule the follow-up appointment.    Schedule apt with device clinic in [place] to send remote transmission [phone]    Get an echocardiogram in 6 months and then see Dr Diona BrownerMcDowell after words   Your physician recommends that you continue on your current medications as directed. Please refer to the Current Medication list given to you today.   If you need a refill on your cardiac medications before your next appointment, please call your pharmacy.     Thank you for choosing [facility] !

## 2017-06-15 ENCOUNTER — Other Ambulatory Visit: Payer: Medicare Other

## 2017-06-16 NOTE — Progress Notes (Signed)
   HPI: 72 year old female presents the office today for evaluation of her left lower extremity.  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  She believes that she needs some support for her foot.  She states that her right foot is doing okay however she experiences significant instability with her left foot and ankle.  She believes that she feels that they are going to break when she walks on them.  Patient has a history of long-standing diabetes mellitus with neuropathy.  Past Medical History:  Diagnosis Date  . Atrial fibrillation ([facility])   . Chronic systolic heart failure ([facility])   . Colon polyps    20 yrs ago  . Coronary atherosclerosis of native coronary artery    Multivessel, patent grafts 2007  . Diabetes mellitus, type 2 ([facility])   . Essential hypertension, benign   . Hyperlipidemia   . Hypothyroidism   . Hypothyroidism   . Ischemic cardiomyopathy    Ejection Fraction of 25% improved to 45-50% following biventicular pacing, Status post biventricular dual-chamber ICD - [employer] [place] H22  . Myocardial infarction ([facility])   . PVD (peripheral vascular disease) ([facility])   . Status cardiac pacemaker   . Ulcer      Physical Exam: General: The patient is alert and oriented x3 in no acute distress.  Dermatology: Skin is warm, dry and supple bilateral lower extremities. Negative for open lesions or macerations.  Vascular: Palpable pedal pulses bilaterally.  There is some mild erythema and redness to the bilateral lower extremities likely secondary to some venous congestion.  Capillary refill within normal limits.  Neurological: Epicritic and protective threshold absent bilaterally.   Musculoskeletal Exam: Degenerative joint disease noted throughout all foot and ankle structures to bilateral feet.  History of second toe amputation to the left foot also noted.  Radiographic Exam:  Degenerative changes noted to the left  foot consistent with a Charcot midfoot arthropathy.  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 X-Rays reviewed.  -Today we are going to set up an appointment with Raiford Nobleick for custom bracing to provide stability and support to her left lower extremity - Continue wearing diabetic shoes with custom molded insoles -Recommend antibiotic ointment and Band-Aid daily to the toe left foot -Return to clinic as needed    Felecia ShellingBrent M. Evans, DPM [facility]  Dr. Felecia ShellingBrent M. Evans, DPM    [address]                                        [place], [place] [id number]                Office [phone]  Fax [phone]

## 2017-06-17 DIAGNOSIS — M79642 Pain in left hand: Secondary | ICD-10-CM | POA: Diagnosis not present

## 2017-06-17 DIAGNOSIS — M720 Palmar fascial fibromatosis [Dupuytren]: Secondary | ICD-10-CM | POA: Diagnosis not present

## 2017-06-22 ENCOUNTER — Other Ambulatory Visit: Payer: Medicare Other

## 2017-06-24 ENCOUNTER — Other Ambulatory Visit: Payer: Self-pay

## 2017-06-24 ENCOUNTER — Telehealth: Payer: Self-pay | Admitting: Cardiology

## 2017-06-24 DIAGNOSIS — I255 Ischemic cardiomyopathy: Secondary | ICD-10-CM

## 2017-06-24 NOTE — Telephone Encounter (Signed)
Order placed for echo.

## 2017-06-24 NOTE — Telephone Encounter (Signed)
Per phone call from pt--she's scheduled for surgery on 07/14/17, states that she's needing an Echo prior to surgery per Dr. Diona BrownerMcDowell. Will need an order

## 2017-06-28 ENCOUNTER — Ambulatory Visit (HOSPITAL_COMMUNITY)
Admission: RE | Admit: 2017-06-28 | Discharge: 2017-06-28 | Disposition: A | Discharge status: Home / Self Care | Payer: Medicare Other | Source: Ambulatory Visit | Attending: Cardiology | Admitting: Cardiology

## 2017-06-28 DIAGNOSIS — E785 Hyperlipidemia, unspecified: Secondary | ICD-10-CM | POA: Diagnosis not present

## 2017-06-28 DIAGNOSIS — Z87891 Personal history of nicotine dependence: Secondary | ICD-10-CM | POA: Diagnosis not present

## 2017-06-28 DIAGNOSIS — I10 Essential (primary) hypertension: Secondary | ICD-10-CM | POA: Insufficient documentation

## 2017-06-28 DIAGNOSIS — I255 Ischemic cardiomyopathy: Secondary | ICD-10-CM | POA: Insufficient documentation

## 2017-06-28 DIAGNOSIS — E119 Type 2 diabetes mellitus without complications: Secondary | ICD-10-CM | POA: Insufficient documentation

## 2017-06-28 DIAGNOSIS — I340 Nonrheumatic mitral (valve) insufficiency: Secondary | ICD-10-CM | POA: Diagnosis not present

## 2017-06-28 NOTE — Progress Notes (Signed)
*  PRELIMINARY RESULTS* Echocardiogram 2D Echocardiogram has been performed.  Jeryl Columbialliott, Maghan Jessee 06/28/2017, 1:27 PM

## 2017-07-07 ENCOUNTER — Ambulatory Visit: Payer: Medicare Other | Admitting: Gastroenterology

## 2017-07-09 ENCOUNTER — Telehealth: Payer: Self-pay | Admitting: Cardiology

## 2017-07-09 ENCOUNTER — Other Ambulatory Visit: Payer: Self-pay | Admitting: Family Medicine

## 2017-07-09 ENCOUNTER — Other Ambulatory Visit: Payer: Self-pay | Admitting: "Endocrinology

## 2017-07-09 NOTE — Telephone Encounter (Signed)
New message  Needs back today if possible 07/09/17      [facility] Pre-operative Risk Assessment    Request for surgical clearance:  1. What type of surgery is being performed?  Left hand   2. When is this surgery scheduled?  07/14/17  3. What type of clearance is required (medical clearance vs. Pharmacy clearance to hold med vs. Both)?  Pharmacy already has been cleared for medical   4. Are there any medications that need to be held prior to surgery and how long?  Eliquis   5. Practice name and name of physician performing surgery?  Dr Caralyn Guile   6. What is your office phone and fax number?  [phone] fax [phone]  7. Anesthesia type (None, local, MAC, general) ?  General    Howie Ill 07/09/2017, 9:26 AM  _________________________________________________________________   (provider comments below)

## 2017-07-09 NOTE — Telephone Encounter (Signed)
Need to clarify what the procedure is before clearance can be provided. 

## 2017-07-12 ENCOUNTER — Ambulatory Visit: Payer: Medicare Other | Admitting: Family

## 2017-07-12 ENCOUNTER — Encounter (HOSPITAL_COMMUNITY): Payer: Medicare Other

## 2017-07-12 NOTE — Telephone Encounter (Signed)
Pt takes Eliquis for afib with CHADS2VASc score of 6 (age, sex, HTN, DM, CAD, CHF). Renal function is normal. Ok to hold Eliquis today and tomorrow for Wednesday procedure. Pre op to address cardiac clearance for general anesthesia. Clearance needs to be faxed back today due to procedure in 2 days.

## 2017-07-12 NOTE — Telephone Encounter (Signed)
   Primary Cardiologist: Nona DellSamuel McDowell, MD  Chart reviewed as part of pre-operative protocol coverage. Patient was contacted 07/12/2017 in reference to pre-operative risk assessment for pending surgery as outlined below.  [name] [name] was last seen on 06/10/2017 by Dr. Diona BrownerMcDowell.  Since that day, [name] [name] has done well.  Therefore, based on ACC/AHA guidelines, the patient would be at acceptable risk for the planned procedure without further cardiovascular testing.   I will route this recommendation to the requesting party via Epic fax function and remove from pre-op pool.  Please call with questions.  Note, patient was already holding eliquis since yesterday 07/11/2017, she will need to restart the eliquis as soon as possible after the procedure at the discretion of the surgeon based on bleeding risk  [name] CourseHao Shian Goodnow, PA 07/12/2017, 6:41 PM

## 2017-07-12 NOTE — Telephone Encounter (Signed)
Called [facility] to [place] [facility]. She stated that the patient is having a:   Left hand and small finger partial palmar fasciectomy and joint release with general anesthesia.

## 2017-07-13 NOTE — Telephone Encounter (Signed)
Called Dr. Glenna Durandrtmann's office and lmom for Halford DecampLaurie Faust surgery scheduler. Called to confirm if clearance letter received by our office.

## 2017-07-15 NOTE — Telephone Encounter (Signed)
Walked medical clearance letter over to office.

## 2017-07-26 ENCOUNTER — Other Ambulatory Visit: Payer: Self-pay

## 2017-07-26 DIAGNOSIS — I6523 Occlusion and stenosis of bilateral carotid arteries: Secondary | ICD-10-CM

## 2017-07-26 DIAGNOSIS — I779 Disorder of arteries and arterioles, unspecified: Secondary | ICD-10-CM

## 2017-08-02 ENCOUNTER — Telehealth: Payer: Self-pay

## 2017-08-02 ENCOUNTER — Telehealth: Payer: Self-pay | Admitting: Podiatry

## 2017-08-02 NOTE — Telephone Encounter (Signed)
Pt called upset stating Dr Isidoro DonningNida's assistant said that Dr Fransico HimNida will not fill out paperwork that the podiatrist has to do that.Pt said to forget the shoes and to go ahead an get the brace.  I told pt I would call there office an get it figured out and let her know.   I called there office and they said pt has to see Dr Fransico HimNida for pt so the paperwork can be filled out. Pt is scheduled to see Dr Fransico HimNida on 5.9.19.  I lvm for pt to call me back.  Pt returned call and is scheduled to see Raiford NobleRick 4.29.19 to pick up brace and we will wait for the shoes to come in to schedule that appt.

## 2017-08-02 NOTE — Telephone Encounter (Signed)
Pt called very upset stating that we have not signed a form from her podiatrist. I explained to her that we will have it to go over at her next visit so Dr Fransico HimNida can perform a foot exam to be able to sign off for diabetic shoes. Pt states we never call her back. I did explain that we have not received any calls from her other that today. I told her that I would double check with Dr Fransico HimNida about the Diabetic shoe form and give her a call back. Pt began to yell on phone> I did tell her I would call back with what Dr Fransico HimNida advises.

## 2017-08-09 ENCOUNTER — Ambulatory Visit (INDEPENDENT_AMBULATORY_CARE_PROVIDER_SITE_OTHER): Payer: Medicare Other | Admitting: Orthotics

## 2017-08-09 DIAGNOSIS — I739 Peripheral vascular disease, unspecified: Secondary | ICD-10-CM | POA: Diagnosis not present

## 2017-08-09 DIAGNOSIS — E559 Vitamin D deficiency, unspecified: Secondary | ICD-10-CM | POA: Diagnosis not present

## 2017-08-09 DIAGNOSIS — M79672 Pain in left foot: Secondary | ICD-10-CM

## 2017-08-09 DIAGNOSIS — E1140 Type 2 diabetes mellitus with diabetic neuropathy, unspecified: Secondary | ICD-10-CM

## 2017-08-09 DIAGNOSIS — E13610 Other specified diabetes mellitus with diabetic neuropathic arthropathy: Secondary | ICD-10-CM

## 2017-08-09 DIAGNOSIS — M76829 Posterior tibial tendinitis, unspecified leg: Secondary | ICD-10-CM | POA: Diagnosis not present

## 2017-08-09 DIAGNOSIS — E1159 Type 2 diabetes mellitus with other circulatory complications: Secondary | ICD-10-CM | POA: Diagnosis not present

## 2017-08-09 DIAGNOSIS — M79671 Pain in right foot: Secondary | ICD-10-CM

## 2017-08-09 DIAGNOSIS — E782 Mixed hyperlipidemia: Secondary | ICD-10-CM | POA: Diagnosis not present

## 2017-08-09 DIAGNOSIS — E1149 Type 2 diabetes mellitus with other diabetic neurological complication: Secondary | ICD-10-CM

## 2017-08-09 LAB — LIPID PANEL
CHOL/HDL RATIO: 5.4 (calc) — AB (ref <5.0)
Cholesterol: 179 mg/dL (ref <200)
HDL: 33 mg/dL — ABNORMAL LOW (ref >50)
LDL CHOLESTEROL (CALC): 118 mg/dL — AB
Non-HDL Cholesterol (Calc): 146 mg/dL (calc) — ABNORMAL HIGH (ref <130)
Triglycerides: 164 mg/dL — ABNORMAL HIGH (ref <150)

## 2017-08-09 LAB — COMPLETE METABOLIC PANEL WITH GFR
AG RATIO: 1.3 (calc) (ref 1.0–2.5)
ALKALINE PHOSPHATASE (APISO): 47 U/L (ref 33–130)
ALT: 11 U/L (ref 6–29)
AST: 18 U/L (ref 10–35)
Albumin: 4.3 g/dL (ref 3.6–5.1)
BILIRUBIN TOTAL: 0.8 mg/dL (ref 0.2–1.2)
BUN: 15 mg/dL (ref 7–25)
CHLORIDE: 102 mmol/L (ref 98–110)
CO2: 32 mmol/L (ref 20–32)
Calcium: 10 mg/dL (ref 8.6–10.4)
Creat: 0.85 mg/dL (ref 0.60–0.93)
GFR, EST AFRICAN AMERICAN: 80 mL/min/{1.73_m2} (ref >=60)
GFR, Est Non African American: 69 mL/min/{1.73_m2} (ref >=60)
Globulin: 3.2 g/dL (calc) (ref 1.9–3.7)
Glucose, Bld: 78 mg/dL (ref 65–99)
POTASSIUM: 4.7 mmol/L (ref 3.5–5.3)
Sodium: 140 mmol/L (ref 135–146)
Total Protein: 7.5 g/dL (ref 6.1–8.1)

## 2017-08-09 NOTE — Progress Notes (Signed)
Patient came in today to pick up standard Afo brace.  Patient was evaluated for fit and function.   The brace fit very well and there were any complaints of the way it felt once donned.  The brace offered ankle stability in both saggital and coroneal planes.  Patient advised to always wear proper fitting shoes with brace. 

## 2017-08-10 ENCOUNTER — Other Ambulatory Visit: Payer: Self-pay | Admitting: "Endocrinology

## 2017-08-10 LAB — HEMOGLOBIN A1C
Hgb A1c MFr Bld: 9.2 % of total Hgb — ABNORMAL HIGH (ref <5.7)
Mean Plasma Glucose: 217 (calc)
eAG (mmol/L): 12 (calc)

## 2017-08-10 LAB — VITAMIN D 25 HYDROXY (VIT D DEFICIENCY, FRACTURES): Vit D, 25-Hydroxy: 21 ng/mL — ABNORMAL LOW (ref 30–100)

## 2017-08-16 ENCOUNTER — Ambulatory Visit: Payer: Medicare Other | Admitting: Family

## 2017-08-16 ENCOUNTER — Encounter (HOSPITAL_COMMUNITY): Payer: Medicare Other

## 2017-08-19 ENCOUNTER — Ambulatory Visit (INDEPENDENT_AMBULATORY_CARE_PROVIDER_SITE_OTHER): Payer: Medicare Other | Admitting: "Endocrinology

## 2017-08-19 ENCOUNTER — Encounter: Payer: Self-pay | Admitting: "Endocrinology

## 2017-08-19 VITALS — BP 149/73 | HR 76 | Ht 67.0 in | Wt 236.0 lb

## 2017-08-19 DIAGNOSIS — E1159 Type 2 diabetes mellitus with other circulatory complications: Secondary | ICD-10-CM

## 2017-08-19 DIAGNOSIS — E559 Vitamin D deficiency, unspecified: Secondary | ICD-10-CM

## 2017-08-19 DIAGNOSIS — I10 Essential (primary) hypertension: Secondary | ICD-10-CM | POA: Diagnosis not present

## 2017-08-19 DIAGNOSIS — E782 Mixed hyperlipidemia: Secondary | ICD-10-CM | POA: Diagnosis not present

## 2017-08-19 DIAGNOSIS — Z6835 Body mass index (BMI) 35.0-35.9, adult: Secondary | ICD-10-CM | POA: Diagnosis not present

## 2017-08-19 DIAGNOSIS — E038 Other specified hypothyroidism: Secondary | ICD-10-CM | POA: Diagnosis not present

## 2017-08-19 MED ORDER — INSULIN LISPRO 100 UNIT/ML (KWIKPEN): 24.0000 [IU] | PEN_INJECTOR | Freq: Three times a day (TID) | SUBCUTANEOUS | 2 refills | Status: AC

## 2017-08-19 MED ORDER — VITAMIN D3 125 MCG (5000 UT) PO CAPS: 5000.0000 [IU] | ORAL_CAPSULE | Freq: Every day | ORAL | 0 refills | Status: AC

## 2017-08-19 NOTE — Patient Instructions (Signed)

## 2017-08-19 NOTE — Progress Notes (Signed)
Subjective:    Patient ID: [name] [name], female    DOB: 12-02-1945, PCP Merlyn Albert, MD   Past Medical History:  Diagnosis Date  . Atrial fibrillation (HCC)   . Chronic systolic heart failure (HCC)   . Colon polyps    20 yrs ago  . Coronary atherosclerosis of native coronary artery    Multivessel, patent grafts 2007  . Diabetes mellitus, type 2 (HCC)   . Essential hypertension, benign   . Hyperlipidemia   . Hypothyroidism   . Hypothyroidism   . Ischemic cardiomyopathy    Ejection Fraction of 25% improved to 45-50% following biventicular pacing, Status post biventricular dual-chamber ICD - [employer]  . Myocardial infarction (HCC)   . PVD (peripheral vascular disease) (HCC)   . Status cardiac pacemaker   . Ulcer    Past Surgical History:  Procedure Laterality Date  . ABDOMINAL AORTAGRAM N/A 08/04/2013   Procedure: ABDOMINAL Ronny Flurry;  Surgeon: Sherren Kerns, MD;  Location: [facility] CATH LAB;  Service: Cardiovascular;  Laterality: N/A;  . APPENDECTOMY    . BI-VENTRICULAR PACEMAKER INSERTION N/A 11/20/2013   Procedure: BI-VENTRICULAR PACEMAKER INSERTION (CRT-P);  Surgeon: Marinus Maw, MD;  Location: [facility] CATH LAB;  Service: Cardiovascular;  Laterality: N/A;  . Bilateral tubal ligation    . CARDIAC DEFIBRILLATOR PLACEMENT     [employer] [place]  . CESAREAN SECTION    . CORONARY ARTERY BYPASS GRAFT  2001   [facility] - LIMA to LAD, SVG to ramus, SVG to OM/PLB, SVG to RCA   . FLEXIBLE SIGMOIDOSCOPY  07/19/1998   Dr. Karilyn Cota  . TONSILLECTOMY     Social History   Socioeconomic History  . Marital status: Divorced    Spouse name: Not on file  . Number of children: 1  . Years of education: Not on file  . Highest education level: Not on file  Occupational History  . Occupation: Full Time: [employer]  . Occupation: Disabled    Associate Professor: NOT EMPLOYED  Social Needs  . Financial resource strain: Not on file  . Food insecurity:   Worry: Not on file    Inability: Not on file  . Transportation needs:    Medical: Not on file    Non-medical: Not on file  Tobacco Use  . Smoking status: Former Smoker    Last attempt to quit: 09/16/1991    Years since quitting: 25.9  . Smokeless tobacco: Never Used  Substance and Sexual Activity  . Alcohol use: No    Alcohol/week: 0.0 oz  . Drug use: No  . Sexual activity: Never    Birth control/protection: Post-menopausal  Lifestyle  . Physical activity:    Days per week: Not on file    Minutes per session: Not on file  . Stress: Not on file  Relationships  . Social connections:    Talks on phone: Not on file    Gets together: Not on file    Attends religious service: Not on file    Active member of club or organization: Not on file    Attends meetings of clubs or organizations: Not on file    Relationship status: Not on file  Other Topics Concern  . Not on file  Social History Narrative  . Not on file   Outpatient Encounter Medications as of 08/19/2017  Medication Sig  . ALPRAZolam (XANAX) 1 MG tablet TAKE ONE-HALF TO ONE TABLET TWO TIMES A DAY AS NEEDED FOR ANXIETY  .  apixaban (ELIQUIS) 5 MG TABS tablet Take 1 tablet (5 mg total) by mouth 2 (two) times daily.  . Blood Glucose Monitoring Suppl (ACCU-CHEK AVIVA) device Use as instructed  . carvedilol (COREG) 25 MG tablet TAKE ONE TABLET TWICE DAILY WITH A MEAL  . Cholecalciferol (VITAMIN D3) 5000 units CAPS Take 1 capsule (5,000 Units total) by mouth daily.  . Continuous Blood Gluc Sensor (FREESTYLE LIBRE SENSOR SYSTEM) MISC Use one sensor every 10 days.  . enalapril (VASOTEC) 10 MG tablet Take 1 tablet (10 mg total) by mouth 2 (two) times daily. (Patient taking differently: Take 10 mg by mouth daily. )  . furosemide (LASIX) 40 MG tablet Take 1 tablet (40 mg total) by mouth 2 (two) times daily.  Marland Kitchen glucose blood (ACCU-CHEK AVIVA) test strip Use to test glucose 4 times a day  . insulin lispro (HUMALOG KWIKPEN) 100 UNIT/ML  KiwkPen Inject 0.24-0.3 mLs (24-30 Units total) into the skin 3 (three) times daily.  . Insulin Pen Needle (B-D ULTRAFINE III SHORT PEN) 31G X 8 MM MISC Use 5 x daily as directed  . LEVEMIR FLEXTOUCH 100 UNIT/ML Pen INJECT 80 UNITS INTO THE SKIN DAILY AT 10:00PM  . multivitamin (THERAGRAN) per tablet Take 1 tablet by mouth daily.    . Omega-3 Fatty Acids (FISH OIL) 1000 MG CAPS Take 1,000 mg by mouth daily.  Marland Kitchen OVER THE COUNTER MEDICATION Take 1 tablet by mouth daily. Phyomega vitamin.  . potassium chloride SA (K-DUR,KLOR-CON) 20 MEQ tablet Take 1 tablet (20 mEq total) by mouth 2 (two) times daily.  Marland Kitchen SYNTHROID 125 MCG tablet TAKE ONE (1) TABLET BY MOUTH EVERY DAY  . traMADol (ULTRAM) 50 MG tablet TAKE ONE TABLET ONCE DAILY FOR MODERATE PAIN  . VICTOZA 18 MG/3ML SOPN INJECT 0.3ML'S (1.8MG  TOTAL) INTO THE SKIN ONCE DAILY  . vitamin E 600 UNIT capsule Take 600 Units by mouth daily.    . [DISCONTINUED] Cholecalciferol (VITAMIN D) 400 UNITS capsule Take 400 Units by mouth daily.  . [DISCONTINUED] HUMALOG KWIKPEN 100 UNIT/ML KiwkPen INJECT 0.22-0.28ML'S (22-28 UNITS TOTAL)INTO THE SKIN THREE TIMES DAILY  . [DISCONTINUED] insulin lispro (HUMALOG KWIKPEN) 100 UNIT/ML KiwkPen Inject 0.22-0.28 mLs (22-28 Units total) into the skin 3 (three) times daily.   No facility-administered encounter medications on file as of 08/19/2017.    ALLERGIES: Allergies  Allergen Reactions  . Avandia [Rosiglitazone] Other (See Comments)    CHF  . Celebrex [Celecoxib] Other (See Comments)    Congestive heart failure  . Tape     Left rash per patient   VACCINATION STATUS: Immunization History  Administered Date(s) Administered  . Influenza,inj,Quad PF,6+ Mos 01/10/2013, 01/01/2014, 12/13/2014, 12/18/2015, 12/15/2016  . Influenza-Unspecified 01/11/2012  . Pneumococcal Conjugate-13 11/13/2013  . Pneumococcal Polysaccharide-23 08/02/2012  . Zoster 11/25/2009    Diabetes  She presents for her follow-up diabetic  visit. She has type 2 diabetes mellitus. Onset time: She was diagnosed at approximate age of 34 years. Her disease course has been worsening. There are no hypoglycemic associated symptoms. Pertinent negatives for hypoglycemia include no confusion, headaches, pallor or seizures. There are no diabetic associated symptoms. Pertinent negatives for diabetes include no chest pain, no polydipsia, no polyphagia and no polyuria. There are no hypoglycemic complications. Symptoms are worsening. Diabetic complications include heart disease. Risk factors for coronary artery disease include diabetes mellitus, dyslipidemia, hypertension, obesity, sedentary lifestyle and tobacco exposure. She is compliant with treatment most of the time. Her weight is increasing steadily. She is following a generally unhealthy diet.  She has had a previous visit with a dietitian. Her breakfast blood glucose range is generally 180-200 mg/dl. Her lunch blood glucose range is generally >200 mg/dl. Her dinner blood glucose range is generally >200 mg/dl. Her bedtime blood glucose range is generally >200 mg/dl. Her overall blood glucose range is >200 mg/dl. An ACE inhibitor/angiotensin II receptor blocker is being taken.  Hyperlipidemia  This is a chronic problem. The current episode started more than 1 year ago. The problem is uncontrolled. Exacerbating diseases include diabetes, hypothyroidism and obesity. Pertinent negatives include no chest pain, myalgias or shortness of breath. Current antihyperlipidemic treatment includes statins. Risk factors for coronary artery disease include dyslipidemia, diabetes mellitus, hypertension, a sedentary lifestyle, post-menopausal and obesity.  Hypertension  This is a chronic problem. The current episode started more than 1 year ago. The problem is uncontrolled. Pertinent negatives include no chest pain, headaches, palpitations or shortness of breath. Risk factors for coronary artery disease include dyslipidemia,  diabetes mellitus, sedentary lifestyle, obesity and smoking/tobacco exposure. Past treatments include ACE inhibitors. Compliance problems include diet.  Hypertensive end-organ damage includes CAD/MI. Identifiable causes of hypertension include a thyroid problem.  Thyroid Problem  Presents for follow-up visit. Patient reports no cold intolerance, diarrhea, heat intolerance or palpitations. The symptoms have been improving. Past treatments include levothyroxine. Her past medical history is significant for diabetes and hyperlipidemia.    Review of Systems  Constitutional: Negative for chills, fever and unexpected weight change.  HENT: Negative for trouble swallowing and voice change.   Eyes: Negative for visual disturbance.  Respiratory: Negative for cough, shortness of breath and wheezing.   Cardiovascular: Negative for chest pain, palpitations and leg swelling.  Gastrointestinal: Negative for diarrhea, nausea and vomiting.  Endocrine: Negative for cold intolerance, heat intolerance, polydipsia, polyphagia and polyuria.  Musculoskeletal: Negative for arthralgias and myalgias.  Skin: Negative for color change, pallor, rash and wound.  Neurological: Negative for seizures and headaches.  Psychiatric/Behavioral: Negative for confusion and suicidal ideas.    Objective:    BP (!) 149/73   Pulse 76   Ht  (1.702 m)   Wt 236 lb (107 kg)   LMP  (LMP Unknown)   BMI 36.96 kg/m   Wt Readings from Last 3 Encounters:  08/19/17 236 lb (107 kg)  06/10/17 234 lb (106.1 kg)  05/21/17 231 lb (104.8 kg)    Physical Exam  Constitutional: She is oriented to person, place, and time. She appears well-developed.  HENT:  Head: Normocephalic and atraumatic.  Eyes: EOM are normal.  Neck: Normal range of motion. Neck supple. No tracheal deviation present. Thyromegaly present.  Cardiovascular: Normal rate.  Pulmonary/Chest: Effort normal.  Abdominal: Bowel sounds are normal. There is no tenderness.  There is no guarding.  Musculoskeletal: Normal range of motion. She exhibits deformity. She exhibits no edema.  Partial amputation of left foot.  Neurological: She is alert and oriented to person, place, and time. She has normal reflexes. No cranial nerve deficit. She exhibits abnormal muscle tone. Coordination normal.  Skin: Skin is warm and dry. No rash noted. No erythema. No pallor.  Psychiatric: She has a normal mood and affect. Judgment normal.   CMP ( most recent) CMP     Component Value Date/Time   NA 140 08/09/2017 0903   K 4.7 08/09/2017 0903   CL 102 08/09/2017 0903   CO2 32 08/09/2017 0903   GLUCOSE 78 08/09/2017 0903   BUN 15 08/09/2017 0903   CREATININE 0.85 08/09/2017 0903   CALCIUM  10.0 08/09/2017 0903   PROT 7.5 08/09/2017 0903   ALBUMIN 3.9 11/18/2016 0956   AST 18 08/09/2017 0903   ALT 11 08/09/2017 0903   ALKPHOS 47 11/18/2016 0956   BILITOT 0.8 08/09/2017 0903   GFRNONAA 69 08/09/2017 0903   GFRAA 80 08/09/2017 0903    Diabetic Labs (most recent): Lab Results  Component Value Date   HGBA1C 9.2 (H) 08/09/2017   HGBA1C 8.4 (H) 04/26/2017   HGBA1C 8.6 (H) 11/18/2016     Lipid Panel ( most recent) Lipid Panel     Component Value Date/Time   CHOL 179 08/09/2017 0903   TRIG 164 (H) 08/09/2017 0903   HDL 33 (L) 08/09/2017 0903   CHOLHDL 5.4 (H) 08/09/2017 0903   VLDL 25 11/18/2016 1000   LDLCALC 118 (H) 08/09/2017 0903   LDLDIRECT 60.5 07/31/2008 1508     Assessment & Plan:   1. Type 2 diabetes mellitus with vascular disease (HCC) -Her diabetes is  complicated by coronary artery disease and patient remains at a high risk for more acute and chronic complications of diabetes which include CAD, CVA, CKD, retinopathy, and neuropathy. These are all discussed in detail with the patient.  Patient came with above target postprandial glucose profile. She admits to dietary indiscretion however she promises to do better. Her A1c is higher at 9.2%, increasing  from 8.4%.     Glucose logs and insulin administration records pertaining to this visit,  to be scanned into patient's records.  Recent labs reviewed.   - I have re-counseled the patient on diet management and weight loss  by adopting a carbohydrate restricted / protein rich  Diet.  -  Suggestion is made for her to avoid simple carbohydrates  from her diet including Cakes, Sweet Desserts / Pastries, Ice Cream, Soda (diet and regular), Sweet Tea, Candies, Chips, Cookies, Store Bought Juices, Alcohol in Excess of  1-2 drinks a day, Artificial Sweeteners, and "Sugar-free" Products. This will help patient to have stable blood glucose profile and potentially avoid unintended weight gain.   - Patient is advised to stick to a routine mealtimes to eat 3 meals  a day and avoid unnecessary snacks ( to snack only to correct hypoglycemia).  - I have approached patient with the following individualized plan to manage diabetes and patient agrees.   -Given her current postprandial glycemic burden, she is approached for higher dose of insulin.   - I will continue with  Levemir 80 units daily at bedtime, and increase Humalog to 24 units 3 times a day before meals for pre-meal blood glucose  readings of 90-150mg /dl, plus patient specific correction dose of rapid acting insulin  for unexpected hyperglycemia above /dl, associated with strict monitoring of glucose 4 times a day-before meals and at bedtime. - Patient is warned not to take insulin without proper monitoring per orders. -Adjustment parameters are given for hypo and hyperglycemia in writing. -Patient is encouraged to call clinic for blood glucose levels less than 70 or above 300 mg /dl. - I will continue Victoza 1.8 mg subcutaneously daily, therapeutically suitable for patient.  - Patient specific target  for A1c; LDL, HDL, Triglycerides, and  Waist Circumference were discussed in detail.  2) BP/HTN: Her blood pressure is not controlled to  target.   I have advised her to continue her current blood pressure medications including  ACEI/ARB. 3) Lipids/HPL: Her recent lipid panel showed uncontrolled LDL at 118.  she is advised to continue taking simvastatin.   4)  Weight/Diet: CDE consult in progress, exercise, and carbohydrates information provided.  5) hypothyroidism:  -Her thyroid function tests are consistent with appropriate replacement.  -I advised her to continue levothyroxine 125 mcg p.o. every morning.   - We discussed about correct intake of levothyroxine, at fasting, with water, separated by at least 30 minutes from breakfast, and separated by more than 4 hours from calcium, iron, multivitamins, acid reflux medications (PPIs). -Patient is made aware of the fact that thyroid hormone replacement is needed for life, dose to be adjusted by periodic monitoring of thyroid function tests. -  her repeat thyroid/neck ultrasound shows stable 8 mm nodule on the right lower her thyroid. She will not need any intervention or follow-up for this.  6) vitamin D deficiency: -Discussed and reinitiated vitamin D3 5000 units daily for the next 90 days.  7) Chronic Care/Health Maintenance:  -Patient is on ACEI/ARB and Statin medications and encouraged to continue to follow up with Ophthalmology, Podiatrist at least yearly or according to recommendations, and advised to  stay away from smoking. I have recommended yearly flu vaccine and pneumonia vaccination at least every 5 years; moderate intensity exercise for up to 150 minutes weekly; and  sleep for at least 7 hours a day.  -Her diabetic foot exam is abnormal including partial limitation of left foot plus decreased monofilament sensation on bilateral feet.  I signed off on her paperwork for diabetic shoes.   - I advised patient to maintain close follow up with Merlyn Albert, MD for primary care needs.  - Time spent with the patient: 25 min, of which >50% was spent in reviewing her  blood glucose logs , discussing her hypo- and hyper-glycemic episodes, reviewing her current and  previous labs and insulin doses and developing a plan to avoid hypo- and hyper-glycemia. Please refer to Patient Instructions for Blood Glucose Monitoring and Insulin/Medications Dosing Guide"  in media tab for additional information. Jakaiya A Brittingham participated in the discussions, expressed understanding, and voiced agreement with the above plans.  All questions were answered to her satisfaction. she is encouraged to contact clinic should she have any questions or concerns prior to her return visit.   Follow up plan: -Return in about 3 months (around 11/19/2017) for follow up with pre-visit labs, meter, and logs.  Marquis Lunch, MD Phone: [phone]  Fax: [phone]  This note was partially dictated with voice recognition software. Similar sounding words can be transcribed inadequately or may not  be corrected upon review.  08/19/2017, 1:50 PM

## 2017-09-07 ENCOUNTER — Telehealth: Payer: Self-pay | Admitting: Cardiology

## 2017-09-07 NOTE — Telephone Encounter (Signed)
Will send to Dr Diona Browner to clarify when next echo is needed

## 2017-09-07 NOTE — Telephone Encounter (Signed)
Pt had an Echo in March, has a recall in to have one in 6 mths per ck out in Feb-- does she still need to have another echo around August-September?

## 2017-09-07 NOTE — Telephone Encounter (Signed)
No, she does not need another echocardiogram in light of the recent study.

## 2017-09-10 ENCOUNTER — Ambulatory Visit: Payer: Medicare Other | Admitting: Gastroenterology

## 2017-09-29 ENCOUNTER — Telehealth: Payer: Self-pay | Admitting: Orthotics

## 2017-09-29 NOTE — Telephone Encounter (Signed)
[name] is scheduled for an appointment on June 24 with Rick. But, she was wondering if she could be seen on June 25th instead. [place] office at 1:30 on the 25th and it would keep her from having to make two trips. Please give her a call.

## 2017-10-04 ENCOUNTER — Ambulatory Visit: Payer: Medicare Other | Admitting: Orthotics

## 2017-10-05 ENCOUNTER — Ambulatory Visit (INDEPENDENT_AMBULATORY_CARE_PROVIDER_SITE_OTHER): Payer: Medicare Other | Admitting: Orthotics

## 2017-10-05 DIAGNOSIS — E13610 Other specified diabetes mellitus with diabetic neuropathic arthropathy: Secondary | ICD-10-CM

## 2017-10-05 DIAGNOSIS — M76829 Posterior tibial tendinitis, unspecified leg: Secondary | ICD-10-CM

## 2017-10-05 DIAGNOSIS — M79672 Pain in left foot: Secondary | ICD-10-CM | POA: Diagnosis not present

## 2017-10-05 DIAGNOSIS — M79671 Pain in right foot: Secondary | ICD-10-CM

## 2017-10-05 DIAGNOSIS — E1140 Type 2 diabetes mellitus with diabetic neuropathy, unspecified: Secondary | ICD-10-CM | POA: Diagnosis not present

## 2017-10-05 DIAGNOSIS — E11610 Type 2 diabetes mellitus with diabetic neuropathic arthropathy: Secondary | ICD-10-CM

## 2017-10-05 DIAGNOSIS — E1149 Type 2 diabetes mellitus with other diabetic neurological complication: Secondary | ICD-10-CM

## 2017-10-05 NOTE — Progress Notes (Signed)

## 2017-10-07 ENCOUNTER — Other Ambulatory Visit: Payer: Self-pay | Admitting: "Endocrinology

## 2017-10-07 ENCOUNTER — Other Ambulatory Visit: Payer: Self-pay | Admitting: Nurse Practitioner

## 2017-10-15 ENCOUNTER — Other Ambulatory Visit: Payer: Self-pay

## 2017-10-15 DIAGNOSIS — I6523 Occlusion and stenosis of bilateral carotid arteries: Secondary | ICD-10-CM

## 2017-10-15 DIAGNOSIS — I6529 Occlusion and stenosis of unspecified carotid artery: Secondary | ICD-10-CM

## 2017-10-15 DIAGNOSIS — I739 Peripheral vascular disease, unspecified: Secondary | ICD-10-CM

## 2017-10-15 DIAGNOSIS — I779 Disorder of arteries and arterioles, unspecified: Secondary | ICD-10-CM

## 2017-10-18 ENCOUNTER — Telehealth: Payer: Self-pay | Admitting: Family Medicine

## 2017-10-18 NOTE — Telephone Encounter (Signed)
Form from [facility] stating patient had Fecal Immunochemical test (FIT) on 09/27/2017 and had positive result.

## 2017-10-20 ENCOUNTER — Other Ambulatory Visit: Payer: Self-pay | Admitting: *Deleted

## 2017-10-20 DIAGNOSIS — R195 Other fecal abnormalities: Secondary | ICD-10-CM

## 2017-10-20 NOTE — Telephone Encounter (Signed)
Discussed with pt. Order for referral put in.  

## 2017-10-20 NOTE — Telephone Encounter (Signed)
I am really not sure when pt last had colonoscopy but see none for a long time, with pos results, needs ref to [facility] for a work up

## 2017-10-20 NOTE — Telephone Encounter (Signed)
Left message to return call 

## 2017-10-26 ENCOUNTER — Other Ambulatory Visit: Payer: Self-pay

## 2017-10-26 ENCOUNTER — Ambulatory Visit (HOSPITAL_COMMUNITY)
Admission: RE | Admit: 2017-10-26 | Discharge: 2017-10-26 | Disposition: A | Discharge status: Home / Self Care | Payer: Medicare Other | Source: Ambulatory Visit | Attending: Family | Admitting: Family

## 2017-10-26 ENCOUNTER — Ambulatory Visit (INDEPENDENT_AMBULATORY_CARE_PROVIDER_SITE_OTHER)
Admission: RE | Admit: 2017-10-26 | Discharge: 2017-10-26 | Disposition: A | Discharge status: Home / Self Care | Payer: Medicare Other | Source: Ambulatory Visit | Attending: Family | Admitting: Family

## 2017-10-26 ENCOUNTER — Encounter: Payer: Self-pay | Admitting: Family

## 2017-10-26 ENCOUNTER — Ambulatory Visit (INDEPENDENT_AMBULATORY_CARE_PROVIDER_SITE_OTHER): Payer: Medicare Other | Admitting: Family

## 2017-10-26 VITALS — BP 181/81 | HR 74 | Temp 97.2°F | Resp 16 | Ht 67.0 in | Wt 232.0 lb

## 2017-10-26 DIAGNOSIS — E1165 Type 2 diabetes mellitus with hyperglycemia: Secondary | ICD-10-CM | POA: Diagnosis not present

## 2017-10-26 DIAGNOSIS — Z95828 Presence of other vascular implants and grafts: Secondary | ICD-10-CM

## 2017-10-26 DIAGNOSIS — E1151 Type 2 diabetes mellitus with diabetic peripheral angiopathy without gangrene: Secondary | ICD-10-CM

## 2017-10-26 DIAGNOSIS — I6523 Occlusion and stenosis of bilateral carotid arteries: Secondary | ICD-10-CM

## 2017-10-26 DIAGNOSIS — Z87891 Personal history of nicotine dependence: Secondary | ICD-10-CM

## 2017-10-26 DIAGNOSIS — I6529 Occlusion and stenosis of unspecified carotid artery: Secondary | ICD-10-CM | POA: Diagnosis not present

## 2017-10-26 DIAGNOSIS — IMO0002 Reserved for concepts with insufficient information to code with codable children: Secondary | ICD-10-CM

## 2017-10-26 DIAGNOSIS — I779 Disorder of arteries and arterioles, unspecified: Secondary | ICD-10-CM

## 2017-10-26 DIAGNOSIS — I739 Peripheral vascular disease, unspecified: Secondary | ICD-10-CM | POA: Diagnosis not present

## 2017-10-26 NOTE — Patient Instructions (Signed)
Stroke Prevention Some health problems and behaviors may make it more likely for you to have a stroke. Below are ways to lessen your risk of having a stroke.  Be active for at least 30 minutes on most or all days.  Do not smoke. Try not to be around others who smoke.  Do not drink too much alcohol. ? Do not have more than 2 drinks a day if you are a man. ? Do not have more than 1 drink a day if you are a woman and are not pregnant.  Eat healthy foods, such as fruits and vegetables. If you were put on a specific diet, follow the diet as told.  Keep your cholesterol levels under control through diet and medicines. Look for foods that are low in saturated fat, trans fat, cholesterol, and are high in fiber.  If you have diabetes, follow all diet plans and take your medicine as told.  Ask your doctor if you need treatment to lower your blood pressure. If you have high blood pressure (hypertension), follow all diet plans and take your medicine as told by your doctor.  If you are 18-39 years old, have your blood pressure checked every 3-5 years. If you are age 40 or older, have your blood pressure checked every year.  Keep a healthy weight. Eat foods that are low in calories, salt, saturated fat, trans fat, and cholesterol.  Do not take drugs.  Avoid birth control pills, if this applies. Talk to your doctor about the risks of taking birth control pills.  Talk to your doctor if you have sleep problems (sleep apnea).  Take all medicine as told by your doctor. ? You may be told to take aspirin or blood thinner medicine. Take this medicine as told by your doctor. ? Understand your medicine instructions.  Make sure any other conditions you have are being taken care of.  Get help right away if:  You suddenly lose feeling (you feel numb) or have weakness in your face, arm, or leg.  Your face or eyelid hangs down to one side.  You suddenly feel confused.  You have trouble talking  (aphasia) or understanding what people are saying.  You suddenly have trouble seeing in one or both eyes.  You suddenly have trouble walking.  You are dizzy.  You lose your balance or your movements are clumsy (uncoordinated).  You suddenly have a very bad headache and you do not know the cause.  You have new chest pain.  Your heart feels like it is fluttering or skipping a beat (irregular heartbeat). Do not wait to see if the symptoms above go away. Get help right away. Call your local emergency services (911 in [place] Do not drive yourself to the hospital. This information is not intended to replace advice given to you by your health care provider. Make sure you discuss any questions you have with your health care provider. Document Released: 09/29/2011 Document Revised: 09/05/2015 Document Reviewed: 09/30/2012 Elsevier Interactive Patient Education  2018 [employer] Inc[employer]     Peripheral Vascular Disease Peripheral vascular disease (PVD) is a disease of the blood vessels that are not part of your heart and brain. A simple term for PVD is poor circulation. In most cases, PVD narrows the blood vessels that carry blood from your heart to the rest of your body. This can result in a decreased supply of blood to your arms, legs, and internal organs, like your stomach or kidneys. However, it most often affects   a person's lower legs and feet. There are two types of PVD.  Organic PVD. This is the more common type. It is caused by damage to the structure of blood vessels.  Functional PVD. This is caused by conditions that make blood vessels contract and tighten (spasm).  Without treatment, PVD tends to get worse over time. PVD can also lead to acute ischemic limb. This is when an arm or limb suddenly has trouble getting enough blood. This is a medical emergency. Follow these instructions at home:  Take medicines only as told by your doctor.  Do not use any tobacco products, including  cigarettes, chewing tobacco, or electronic cigarettes. If you need help quitting, ask your doctor.  Lose weight if you are overweight, and maintain a healthy weight as told by your doctor.  Eat a diet that is low in fat and cholesterol. If you need help, ask your doctor.  Exercise regularly. Ask your doctor for some good activities for you.  Take good care of your feet. ? Wear comfortable shoes that fit well. ? Check your feet often for any cuts or sores. Contact a doctor if:  You have cramps in your legs while walking.  You have leg pain when you are at rest.  You have coldness in a leg or foot.  Your skin changes.  You are unable to get or have an erection (erectile dysfunction).  You have cuts or sores on your feet that are not healing. Get help right away if:  Your arm or leg turns cold and blue.  Your arms or legs become red, warm, swollen, painful, or numb.  You have chest pain or trouble breathing.  You suddenly have weakness in your face, arm, or leg.  You become very confused or you cannot speak.  You suddenly have a very bad headache.  You suddenly cannot see. This information is not intended to replace advice given to you by your health care provider. Make sure you discuss any questions you have with your health care provider. Document Released: 06/24/2009 Document Revised: 09/05/2015 Document Reviewed: 09/07/2013 Elsevier Interactive Patient Education  2017 Elsevier Inc.  

## 2017-10-26 NOTE — Progress Notes (Signed)
[facility] [place] HISTORY AND PHYSICAL   CC: Follow up extracranial carotid artery stenosis and peripheral artery occlusive disease    History of Present Illness:   [name] [name] is a 71 y.o. female who is s/p arteriogram with left iliac artery stenting (April 2015) by Dr. Darrick Penna. 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  The patient had an ulceration on the lateral aspect of her left foot that healed with treatment by Dr. Leanord Hawking at the wound center.  She is on Eliquis. chronic medical problems include congestive heart failure, diabetes, hyperlipidemia, hypertension, ischemic cardiomyopathy with ICD. All these problems are currently stable.    She also has known extracranial carotid artery stenosis.  She denies any hx of stroke or TIA.   After walking about 5 minutes, her ankles hurt, left more so than right. She is able to walk again after 3-4 minutes.  She denies non healing wounds on her feet or legs.  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   She states her blood pressure at home runs about 150's/60's.   Last serum creatinine result on file was 0.72 on 11-18-16.   Pt Diabetic: Yes, last A1C result on file was 9.2 on 08-09-17, uncontrolled, sees Dr. Fransico Him, endocrinologist; however, pt states she is under a lot of stress in the last 3 months due to she has to move since her landlord is selling her house, car trouble, etc.  Pt smoker: former smoker, quit in 1993   Pt meds include: Statin :No, states she takes a natural supplement instead "and it works"  Betablocker: yes ASA: No Other anticoagulants/antiplatelets: Eliquis, takes for atrial fib      Current Outpatient Medications  Medication Sig Dispense Refill  . ALPRAZolam (XANAX) 1 MG tablet TAKE ONE-HALF TO ONE TABLET BY MOUTH TWOTIMES A DAY  AS NEEDED FOR ANXIETY 60 tablet 2  . apixaban (ELIQUIS) 5 MG TABS tablet Take 1 tablet (5 mg total) by mouth 2 (two) times daily. 180 tablet 3  . Blood Glucose Monitoring Suppl (ACCU-CHEK AVIVA) device Use as instructed 1 each 0  . carvedilol (COREG) 25 MG tablet TAKE ONE TABLET TWICE DAILY WITH A MEAL 180 tablet 3  . Cholecalciferol (VITAMIN D3) 5000 units CAPS Take 1 capsule (5,000 Units total) by mouth daily. 90 capsule 0  . Continuous Blood Gluc Sensor (FREESTYLE LIBRE SENSOR SYSTEM) MISC Use one sensor every 10 days. 3 each 2  . enalapril (VASOTEC) 10 MG tablet Take 1 tablet (10 mg total) by mouth 2 (two) times daily. (Patient taking differently: Take 10 mg by mouth daily. ) 180 tablet 3  . furosemide (LASIX) 40 MG tablet Take 1 tablet (40 mg total) by mouth 2 (two) times daily. 180 tablet 3  . glucose blood (ACCU-CHEK AVIVA) test strip Use to test glucose 4 times a day 150 each 5  . glucose blood test strip Accu-Chek Aviva Plus test strips    . insulin lispro (HUMALOG KWIKPEN) 100 UNIT/ML KiwkPen Inject 0.24-0.3 mLs (24-30 Units total) into the skin 3 (three) times daily. 30 mL 2  . Insulin Pen Needle (B-D ULTRAFINE III SHORT PEN) 31G X 8 MM MISC Use 5 x daily as directed 150 each 5  . LEVEMIR FLEXTOUCH 100 UNIT/ML Pen INJECT 80 UNITS INTO THE SKIN DAILY AT 10:00PM 30 mL 2  . multivitamin (THERAGRAN)  per tablet Take 1 tablet by mouth daily.      . Omega-3 Fatty Acids (FISH OIL) 1000 MG CAPS Take 1,000 mg by mouth daily.    Marland Kitchen OVER THE COUNTER MEDICATION Take 1 tablet by mouth daily. Phyomega vitamin.    . potassium chloride SA (K-DUR,KLOR-CON) 20 MEQ tablet Take 1 tablet (20 mEq total) by mouth 2 (two) times daily. 180 tablet 3  . SYNTHROID 125 MCG tablet TAKE ONE (1) TABLET BY MOUTH EVERY DAY 30 tablet 2  . traMADol (ULTRAM) 50 MG tablet TAKE ONE TABLET ONCE DAILY FOR MODERATE PAIN 30 tablet 5  . VICTOZA 18 MG/3ML SOPN INJECT 0.3ML'S (1.8MG  TOTAL) INTO THE SKIN ONCE DAILY 9 mL 2  . vitamin  E 600 UNIT capsule Take 600 Units by mouth daily.       No current facility-administered medications for this visit.     Past Medical History:  Diagnosis Date  . Atrial fibrillation (HCC)   . Chronic systolic heart failure (HCC)   . Colon polyps    20 yrs ago  . Coronary atherosclerosis of native coronary artery    Multivessel, patent grafts 2007  . Diabetes mellitus, type 2 (HCC)   . Essential hypertension, benign   . Hyperlipidemia   . Hypothyroidism   . Hypothyroidism   . Ischemic cardiomyopathy    Ejection Fraction of 25% improved to 45-50% following biventicular pacing, Status post biventricular dual-chamber ICD - [employer]  . Myocardial infarction (HCC)   . PVD (peripheral vascular disease) (HCC)   . Status cardiac pacemaker   . Ulcer     Social History Social History   Tobacco Use  . Smoking status: Former Smoker    Last attempt to quit: 09/16/1991    Years since quitting: 26.1  . Smokeless tobacco: Never Used  Substance Use Topics  . Alcohol use: No    Alcohol/week: 0.0 oz  . Drug use: No    Family History Family History  Problem Relation Age of Onset  . Diabetes Mother   . Ulcers Mother   . Heart disease Mother        before age 37  . Hyperlipidemia Mother   . Hypertension Mother   . Diabetes Father   . Heart attack Father   . Hyperlipidemia Father   . Hypertension Father   . Heart disease Father        before age 24  . AAA (abdominal aortic aneurysm) Father   . Diabetes Sister   . Heart disease Sister        before age 20  . Heart attack Sister   . Coronary artery disease Sister 23    Surgical History Past Surgical History:  Procedure Laterality Date  . ABDOMINAL AORTAGRAM N/A 08/04/2013   Procedure: ABDOMINAL Ronny Flurry;  Surgeon: Sherren Kerns, MD;  Location: [facility] CATH LAB;  Service: Cardiovascular;  Laterality: N/A;  . APPENDECTOMY    . BI-VENTRICULAR PACEMAKER INSERTION N/A 11/20/2013   Procedure: BI-VENTRICULAR  PACEMAKER INSERTION (CRT-P);  Surgeon: Marinus Maw, MD;  Location: [facility] CATH LAB;  Service: Cardiovascular;  Laterality: N/A;  . Bilateral tubal ligation    . CARDIAC DEFIBRILLATOR PLACEMENT     [employer] [place]  . CESAREAN SECTION    . CORONARY ARTERY BYPASS GRAFT  2001   [facility] - LIMA to LAD, SVG to ramus, SVG to OM/PLB, SVG to RCA   . FLEXIBLE SIGMOIDOSCOPY  07/19/1998   Dr. Karilyn Cota  . TONSILLECTOMY  Allergies  Allergen Reactions  . Rosiglitazone Other (See Comments)    CHF  . Tapentadol Rash  . Tape     Left rash per patient  . Celecoxib Other (See Comments)    Congestive heart failure    Current Outpatient Medications  Medication Sig Dispense Refill  . ALPRAZolam (XANAX) 1 MG tablet TAKE ONE-HALF TO ONE TABLET BY MOUTH TWOTIMES A DAY AS NEEDED FOR ANXIETY 60 tablet 2  . apixaban (ELIQUIS) 5 MG TABS tablet Take 1 tablet (5 mg total) by mouth 2 (two) times daily. 180 tablet 3  . Blood Glucose Monitoring Suppl (ACCU-CHEK AVIVA) device Use as instructed 1 each 0  . carvedilol (COREG) 25 MG tablet TAKE ONE TABLET TWICE DAILY WITH A MEAL 180 tablet 3  . Cholecalciferol (VITAMIN D3) 5000 units CAPS Take 1 capsule (5,000 Units total) by mouth daily. 90 capsule 0  . Continuous Blood Gluc Sensor (FREESTYLE LIBRE SENSOR SYSTEM) MISC Use one sensor every 10 days. 3 each 2  . enalapril (VASOTEC) 10 MG tablet Take 1 tablet (10 mg total) by mouth 2 (two) times daily. (Patient taking differently: Take 10 mg by mouth daily. ) 180 tablet 3  . furosemide (LASIX) 40 MG tablet Take 1 tablet (40 mg total) by mouth 2 (two) times daily. 180 tablet 3  . glucose blood (ACCU-CHEK AVIVA) test strip Use to test glucose 4 times a day 150 each 5  . glucose blood test strip Accu-Chek Aviva Plus test strips    . insulin lispro (HUMALOG KWIKPEN) 100 UNIT/ML KiwkPen Inject 0.24-0.3 mLs (24-30 Units total) into the skin 3 (three) times daily. 30 mL 2  . Insulin Pen Needle (B-D ULTRAFINE III SHORT  PEN) 31G X 8 MM MISC Use 5 x daily as directed 150 each 5  . LEVEMIR FLEXTOUCH 100 UNIT/ML Pen INJECT 80 UNITS INTO THE SKIN DAILY AT 10:00PM 30 mL 2  . multivitamin (THERAGRAN) per tablet Take 1 tablet by mouth daily.      . Omega-3 Fatty Acids (FISH OIL) 1000 MG CAPS Take 1,000 mg by mouth daily.    Marland Kitchen OVER THE COUNTER MEDICATION Take 1 tablet by mouth daily. Phyomega vitamin.    . potassium chloride SA (K-DUR,KLOR-CON) 20 MEQ tablet Take 1 tablet (20 mEq total) by mouth 2 (two) times daily. 180 tablet 3  . SYNTHROID 125 MCG tablet TAKE ONE (1) TABLET BY MOUTH EVERY DAY 30 tablet 2  . traMADol (ULTRAM) 50 MG tablet TAKE ONE TABLET ONCE DAILY FOR MODERATE PAIN 30 tablet 5  . VICTOZA 18 MG/3ML SOPN INJECT 0.3ML'S (1.8MG  TOTAL) INTO THE SKIN ONCE DAILY 9 mL 2  . vitamin E 600 UNIT capsule Take 600 Units by mouth daily.       No current facility-administered medications for this visit.      REVIEW OF SYSTEMS: See HPI for pertinent positives and negatives.  Physical Examination Vitals:   10/26/17 1531 10/26/17 1539 10/26/17 1541  BP: (!) 185/75 (!) 181/73 (!) 181/81  Pulse: 74 74 74  Resp: 16    Temp: (!) 97.2 F (36.2 C)    TempSrc: Oral    SpO2: 96%    Weight: 232 lb (105.2 kg)    Height: 5\' 7"  (1.702 m)     Body mass index is 36.34 kg/m.  General:  WDWN obese female in NAD Gait: limp, using cane HENT: WNL Eyes: PERRLA Pulmonary: normal non-labored breathing, limited air movement, few rales in both bases, no rhonchi or wheezing.  Cardiac: RRR, no murmur detected Abdomen: soft, NT, no masses palpated, large panus Skin: no rashes, no ulcers, no cellulitis.   VASCULAR EXAM  Carotid Bruits Right Left   Negative Negative      Radial pulses are 1+ palpable bilaterally   Adominal aortic pulse is not palpable                      VASCULAR EXAM: Extremities without ischemic changes, without Gangrene; without open wounds. Left second toe is surgically absent (pt states this  got infected and had to be removed). Both feet and all toes of both feet are pink and warm with brisk capillary refill.                                                                                                                                                        LE Pulses Right Left       FEMORAL  not palpable  not palpable        POPLITEAL  not palpable   not palpable       POSTERIOR TIBIAL  not palpable   not palpable        DORSALIS PEDIS      ANTERIOR TIBIAL not palpable  not palpable     Musculoskeletal: no muscle wasting or atrophy; no peripheral edema        Neurologic:  A&O X 3; appropriate affect, sensation is normal; speech is normal, CN 2-12 intact, pain and light touch intact in extremities, motor exam as listed above. Psychiatric: Normal thought content, mood appropriate to clinical situation.      ASSESSMENT:  [name] [name] is a 72 y.o. female who is s/p left iliac artery stenting (April 2015) by Dr. Darrick Penna. 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 She also has bilateral extracranial carotid artery stenosis.   She has pain in both ankles after 5 minutes of walking, relieved by rest. There are no signs of ischemia in her feet or legs, all toes are pink and warm with brisk capillary refill. 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   Both feet, and all toes of both feet are pink and warm with brisk capillary refill.   She has no history of stroke or TIA.   Her atherosclerotic risk factors include uncontrolled DM, former smoker, and obesity.   I discussed with Dr. Myra Gianotti at a previous visit, pt HPI and ABI results today. Pt states she is able to tolerate her sx's, and wants to try a graduated walking program.  See Plan.  DATA  Carotid Duplex (10-26-17): Bilateral ICA with 60-79%  stenosis. Bilateral ECA with >50% stenosis. Bilateral vertebral artery flow is  antegrade.  Bilateral subclavian artery waveforms are normal.  No change compared to the exam on 01-11-17.   ABI (Date: 10/26/2017):  R:   ABI: 0.36 (was absent on 01-11-17),   PT: mono  DP: mono  TBI:  0.27, toe pressure 48 (was 0.18)  L:   ABI: 0.28 (was 0.34),   PT: mono  DP: mono  TBI: 0.13, toe pressure 23 (was 0.18)  ABI and TBI improved on the right, stable on the left. All monophasic waveforms.  Severe disease in the right LE, critical limb ischemia in the left.    PLAN:   Based on today's exam and non-invasive vascular lab results, the patient will follow up in 6 months with ABI's and carotid duplex.  I advised pt to notify us if she develops concerns re the circulation in her feet or legs.   Daily seated legs exercises demonstrated and discussed.   I discussed in depth with the patient the nature of atherosclerosis, and emphasized the importance of maximal medical management including strict control of blood pressure, blood glucose, and lipid levels, obtaining regular exercise, and cessation of smoking.  The patient is aware that without maximal medical management the underlying atherosclerotic disease process will progress, limiting the benefit of any interventions.  The patient was given information about stroke prevention and what symptoms should prompt the patient to seek immediate medical care.  The patient was given information about PAD including signs, symptoms, treatment, what symptoms should prompt the patient to seek immediate medical care, and risk reduction measures to take.  Thank you for allowing us to participate in this patient's care.  Charisse MarchSuzanne Malcome Ambrocio, RN, MSN, FNP-C [facility] Office: [phone]  Clinic MD: Early 10/26/2017 3:46 PM

## 2017-10-26 NOTE — Progress Notes (Signed)
Vitals:   10/26/17 1531 10/26/17 1539  BP: (!) 185/75 (!) 181/73  Pulse: 74 74  Resp: 16   Temp: (!) 97.2 F (36.2 C)   TempSrc: Oral   SpO2: 96%   Weight: 232 lb (105.2 kg)   Height: 5\' 7"  (1.702 m)

## 2017-11-09 ENCOUNTER — Encounter: Payer: Self-pay | Admitting: "Endocrinology

## 2017-11-15 ENCOUNTER — Other Ambulatory Visit: Payer: Self-pay | Admitting: *Deleted

## 2017-11-15 NOTE — Patient Outreach (Signed)
[facility] Care Management  11/15/2017  [name] [name] 26-Nov-1945 [id number]  Referral via EMMI-Prevent Call:  Telephone call to patient who was [facility].  HIPPA verification received from patient.   Patient voices that she does have DM 2 and A1c level was elevated to 8.0 three months ago. States she needs to get it  down to 7.0.  Voices that she sees endocrinologist every 3 months. Patient voices that she knows what to do but its just a matter of doing it . States she reads a lot about diabetes and understands what she should do.  [facility] of Health Coach & of care coordination. Patient thanked this [employer] [facility] care coordinator for call but declined [facility] services.   Plan: Case closure. Send [facility] contact information.   Colleen CanLinda Geraldean Walen, RN BSN CCM Care Management Coordinator [facility]  [phone]

## 2017-11-16 ENCOUNTER — Encounter: Payer: Self-pay | Admitting: *Deleted

## 2017-11-19 ENCOUNTER — Ambulatory Visit: Payer: Medicare Other | Admitting: "Endocrinology

## 2017-12-07 ENCOUNTER — Other Ambulatory Visit (HOSPITAL_COMMUNITY)
Admission: RE | Admit: 2017-12-07 | Discharge: 2017-12-07 | Disposition: A | Discharge status: Home / Self Care | Payer: Medicare Other | Source: Other Acute Inpatient Hospital | Attending: Internal Medicine | Admitting: Internal Medicine

## 2017-12-07 ENCOUNTER — Encounter (HOSPITAL_BASED_OUTPATIENT_CLINIC_OR_DEPARTMENT_OTHER): Payer: Medicare Other | Attending: Internal Medicine

## 2017-12-07 DIAGNOSIS — I110 Hypertensive heart disease with heart failure: Secondary | ICD-10-CM | POA: Insufficient documentation

## 2017-12-07 DIAGNOSIS — E1151 Type 2 diabetes mellitus with diabetic peripheral angiopathy without gangrene: Secondary | ICD-10-CM | POA: Insufficient documentation

## 2017-12-07 DIAGNOSIS — I509 Heart failure, unspecified: Secondary | ICD-10-CM | POA: Insufficient documentation

## 2017-12-07 DIAGNOSIS — I2510 Atherosclerotic heart disease of native coronary artery without angina pectoris: Secondary | ICD-10-CM | POA: Diagnosis not present

## 2017-12-07 DIAGNOSIS — E11621 Type 2 diabetes mellitus with foot ulcer: Secondary | ICD-10-CM | POA: Insufficient documentation

## 2017-12-07 DIAGNOSIS — L03116 Cellulitis of left lower limb: Secondary | ICD-10-CM | POA: Diagnosis not present

## 2017-12-07 DIAGNOSIS — L03032 Cellulitis of left toe: Secondary | ICD-10-CM | POA: Diagnosis not present

## 2017-12-07 DIAGNOSIS — L97521 Non-pressure chronic ulcer of other part of left foot limited to breakdown of skin: Secondary | ICD-10-CM | POA: Diagnosis not present

## 2017-12-07 DIAGNOSIS — L97522 Non-pressure chronic ulcer of other part of left foot with fat layer exposed: Secondary | ICD-10-CM | POA: Diagnosis not present

## 2017-12-07 DIAGNOSIS — E1140 Type 2 diabetes mellitus with diabetic neuropathy, unspecified: Secondary | ICD-10-CM | POA: Diagnosis not present

## 2017-12-07 DIAGNOSIS — L97529 Non-pressure chronic ulcer of other part of left foot with unspecified severity: Secondary | ICD-10-CM | POA: Diagnosis not present

## 2017-12-08 ENCOUNTER — Ambulatory Visit: Payer: Medicare Other | Admitting: Gastroenterology

## 2017-12-08 ENCOUNTER — Other Ambulatory Visit: Payer: Self-pay | Admitting: "Endocrinology

## 2017-12-10 LAB — AEROBIC CULTURE  (SUPERFICIAL SPECIMEN)

## 2017-12-10 LAB — AEROBIC CULTURE W GRAM STAIN (SUPERFICIAL SPECIMEN)

## 2017-12-14 ENCOUNTER — Encounter: Payer: Self-pay | Admitting: "Endocrinology

## 2017-12-14 ENCOUNTER — Ambulatory Visit: Payer: Medicare Other | Admitting: "Endocrinology

## 2017-12-16 ENCOUNTER — Other Ambulatory Visit (HOSPITAL_COMMUNITY): Payer: Self-pay

## 2017-12-16 ENCOUNTER — Ambulatory Visit (HOSPITAL_COMMUNITY)
Admission: RE | Admit: 2017-12-16 | Discharge: 2017-12-16 | Disposition: A | Discharge status: Home / Self Care | Payer: Medicare Other | Source: Ambulatory Visit | Attending: Internal Medicine | Admitting: Internal Medicine

## 2017-12-16 ENCOUNTER — Ambulatory Visit: Payer: Medicare Other | Admitting: Cardiology

## 2017-12-16 ENCOUNTER — Encounter (HOSPITAL_BASED_OUTPATIENT_CLINIC_OR_DEPARTMENT_OTHER): Payer: Medicare Other | Attending: Internal Medicine

## 2017-12-16 DIAGNOSIS — L97529 Non-pressure chronic ulcer of other part of left foot with unspecified severity: Principal | ICD-10-CM

## 2017-12-16 DIAGNOSIS — M19072 Primary osteoarthritis, left ankle and foot: Secondary | ICD-10-CM | POA: Insufficient documentation

## 2017-12-16 DIAGNOSIS — E13621 Other specified diabetes mellitus with foot ulcer: Secondary | ICD-10-CM

## 2017-12-16 DIAGNOSIS — Z8781 Personal history of (healed) traumatic fracture: Secondary | ICD-10-CM | POA: Diagnosis not present

## 2017-12-16 DIAGNOSIS — M7989 Other specified soft tissue disorders: Secondary | ICD-10-CM | POA: Diagnosis not present

## 2017-12-16 DIAGNOSIS — B9561 Methicillin susceptible Staphylococcus aureus infection as the cause of diseases classified elsewhere: Secondary | ICD-10-CM | POA: Insufficient documentation

## 2017-12-16 DIAGNOSIS — L97522 Non-pressure chronic ulcer of other part of left foot with fat layer exposed: Secondary | ICD-10-CM | POA: Insufficient documentation

## 2017-12-16 DIAGNOSIS — M795 Residual foreign body in soft tissue: Secondary | ICD-10-CM | POA: Insufficient documentation

## 2017-12-16 DIAGNOSIS — E1140 Type 2 diabetes mellitus with diabetic neuropathy, unspecified: Secondary | ICD-10-CM | POA: Diagnosis not present

## 2017-12-16 DIAGNOSIS — E11621 Type 2 diabetes mellitus with foot ulcer: Secondary | ICD-10-CM | POA: Insufficient documentation

## 2017-12-16 DIAGNOSIS — I509 Heart failure, unspecified: Secondary | ICD-10-CM | POA: Diagnosis not present

## 2017-12-16 DIAGNOSIS — M85872 Other specified disorders of bone density and structure, left ankle and foot: Secondary | ICD-10-CM | POA: Insufficient documentation

## 2017-12-16 DIAGNOSIS — I739 Peripheral vascular disease, unspecified: Secondary | ICD-10-CM | POA: Diagnosis not present

## 2017-12-16 DIAGNOSIS — I2510 Atherosclerotic heart disease of native coronary artery without angina pectoris: Secondary | ICD-10-CM | POA: Diagnosis not present

## 2017-12-16 DIAGNOSIS — E1151 Type 2 diabetes mellitus with diabetic peripheral angiopathy without gangrene: Secondary | ICD-10-CM | POA: Diagnosis not present

## 2017-12-16 DIAGNOSIS — Z89422 Acquired absence of other left toe(s): Secondary | ICD-10-CM | POA: Insufficient documentation

## 2017-12-16 DIAGNOSIS — I110 Hypertensive heart disease with heart failure: Secondary | ICD-10-CM | POA: Diagnosis not present

## 2017-12-17 ENCOUNTER — Telehealth: Payer: Self-pay | Admitting: Family Medicine

## 2017-12-17 NOTE — Telephone Encounter (Signed)
Patient is requesting orders for care givier to assist her in the home per her  wound care doctor Dr. Johnnette Barrios .She states if you need to review her notes they are in the system.Marland Kitchen Her number is [phone]

## 2017-12-17 NOTE — Telephone Encounter (Signed)
Patient states she needs help with ADL activities and Dr. Merilynn Finland had told her she needed to check with you to have home health come out to help her as she cannot stand on her feet for very long. She is aware that you are out until early next week and will await a call with an answer at that time. She states if you needed to see any documentation on her that she needs help it is under Dr. Myriam Forehand note in epic.

## 2017-12-18 NOTE — Telephone Encounter (Signed)
I could not find complete notes in the system, I believe any phsyician is able to make home health referrals and I think it is better done by the physician managing the problem.et pt know this

## 2017-12-20 NOTE — Telephone Encounter (Signed)
Patient is aware 

## 2017-12-23 ENCOUNTER — Encounter: Payer: Medicare Other | Admitting: Internal Medicine

## 2017-12-24 ENCOUNTER — Other Ambulatory Visit: Payer: Self-pay | Admitting: "Endocrinology

## 2017-12-30 DIAGNOSIS — I110 Hypertensive heart disease with heart failure: Secondary | ICD-10-CM | POA: Diagnosis not present

## 2017-12-30 DIAGNOSIS — L97522 Non-pressure chronic ulcer of other part of left foot with fat layer exposed: Secondary | ICD-10-CM | POA: Diagnosis not present

## 2017-12-30 DIAGNOSIS — E1159 Type 2 diabetes mellitus with other circulatory complications: Secondary | ICD-10-CM | POA: Diagnosis not present

## 2017-12-30 DIAGNOSIS — I509 Heart failure, unspecified: Secondary | ICD-10-CM | POA: Diagnosis not present

## 2017-12-30 DIAGNOSIS — E1140 Type 2 diabetes mellitus with diabetic neuropathy, unspecified: Secondary | ICD-10-CM | POA: Diagnosis not present

## 2017-12-30 DIAGNOSIS — E1151 Type 2 diabetes mellitus with diabetic peripheral angiopathy without gangrene: Secondary | ICD-10-CM | POA: Diagnosis not present

## 2017-12-30 DIAGNOSIS — E11621 Type 2 diabetes mellitus with foot ulcer: Secondary | ICD-10-CM | POA: Diagnosis not present

## 2017-12-30 DIAGNOSIS — I2510 Atherosclerotic heart disease of native coronary artery without angina pectoris: Secondary | ICD-10-CM | POA: Diagnosis not present

## 2017-12-30 DIAGNOSIS — B9561 Methicillin susceptible Staphylococcus aureus infection as the cause of diseases classified elsewhere: Secondary | ICD-10-CM | POA: Diagnosis not present

## 2017-12-31 ENCOUNTER — Emergency Department (HOSPITAL_COMMUNITY): Payer: Medicare Other

## 2017-12-31 ENCOUNTER — Inpatient Hospital Stay (HOSPITAL_COMMUNITY): Payer: Medicare Other

## 2017-12-31 ENCOUNTER — Inpatient Hospital Stay (HOSPITAL_COMMUNITY)
Admission: EM | Admit: 2017-12-31 | Discharge: 2018-01-11 | DRG: 023 | Disposition: E | Payer: Medicare Other | Attending: Neurology | Admitting: Neurology

## 2017-12-31 ENCOUNTER — Encounter (HOSPITAL_COMMUNITY): Payer: Self-pay | Admitting: Anesthesiology

## 2017-12-31 ENCOUNTER — Ambulatory Visit (HOSPITAL_COMMUNITY): Payer: Medicare Other

## 2017-12-31 ENCOUNTER — Other Ambulatory Visit: Payer: Self-pay

## 2017-12-31 ENCOUNTER — Encounter (HOSPITAL_COMMUNITY): Admission: EM | Disposition: E | Payer: Self-pay | Source: Home / Self Care | Attending: Neurology

## 2017-12-31 ENCOUNTER — Ambulatory Visit (HOSPITAL_COMMUNITY)
Admission: EM | Admit: 2017-12-31 | Discharge: 2017-12-31 | Disposition: A | Discharge status: Home / Self Care | Payer: Medicare Other | Attending: Radiology | Admitting: Radiology

## 2017-12-31 ENCOUNTER — Emergency Department (HOSPITAL_COMMUNITY): Payer: Medicare Other | Admitting: Anesthesiology

## 2017-12-31 DIAGNOSIS — E11319 Type 2 diabetes mellitus with unspecified diabetic retinopathy without macular edema: Secondary | ICD-10-CM | POA: Diagnosis present

## 2017-12-31 DIAGNOSIS — Z9851 Tubal ligation status: Secondary | ICD-10-CM

## 2017-12-31 DIAGNOSIS — I5022 Chronic systolic (congestive) heart failure: Secondary | ICD-10-CM | POA: Diagnosis not present

## 2017-12-31 DIAGNOSIS — Z91048 Other nonmedicinal substance allergy status: Secondary | ICD-10-CM

## 2017-12-31 DIAGNOSIS — I6302 Cerebral infarction due to thrombosis of basilar artery: Secondary | ICD-10-CM

## 2017-12-31 DIAGNOSIS — I6782 Cerebral ischemia: Secondary | ICD-10-CM | POA: Insufficient documentation

## 2017-12-31 DIAGNOSIS — R1312 Dysphagia, oropharyngeal phase: Secondary | ICD-10-CM | POA: Diagnosis not present

## 2017-12-31 DIAGNOSIS — Y838 Other surgical procedures as the cause of abnormal reaction of the patient, or of later complication, without mention of misadventure at the time of the procedure: Secondary | ICD-10-CM | POA: Diagnosis not present

## 2017-12-31 DIAGNOSIS — E870 Hyperosmolality and hypernatremia: Secondary | ICD-10-CM | POA: Diagnosis not present

## 2017-12-31 DIAGNOSIS — R29713 NIHSS score 13: Secondary | ICD-10-CM | POA: Diagnosis present

## 2017-12-31 DIAGNOSIS — I6523 Occlusion and stenosis of bilateral carotid arteries: Secondary | ICD-10-CM | POA: Diagnosis not present

## 2017-12-31 DIAGNOSIS — J984 Other disorders of lung: Secondary | ICD-10-CM | POA: Diagnosis not present

## 2017-12-31 DIAGNOSIS — G936 Cerebral edema: Secondary | ICD-10-CM | POA: Diagnosis present

## 2017-12-31 DIAGNOSIS — I517 Cardiomegaly: Secondary | ICD-10-CM | POA: Diagnosis not present

## 2017-12-31 DIAGNOSIS — E782 Mixed hyperlipidemia: Secondary | ICD-10-CM | POA: Diagnosis present

## 2017-12-31 DIAGNOSIS — Z888 Allergy status to other drugs, medicaments and biological substances status: Secondary | ICD-10-CM

## 2017-12-31 DIAGNOSIS — E11610 Type 2 diabetes mellitus with diabetic neuropathic arthropathy: Secondary | ICD-10-CM | POA: Diagnosis not present

## 2017-12-31 DIAGNOSIS — Z66 Do not resuscitate: Secondary | ICD-10-CM | POA: Diagnosis not present

## 2017-12-31 DIAGNOSIS — J9811 Atelectasis: Secondary | ICD-10-CM | POA: Diagnosis not present

## 2017-12-31 DIAGNOSIS — J9601 Acute respiratory failure with hypoxia: Secondary | ICD-10-CM | POA: Diagnosis not present

## 2017-12-31 DIAGNOSIS — J9600 Acute respiratory failure, unspecified whether with hypoxia or hypercapnia: Secondary | ICD-10-CM | POA: Diagnosis not present

## 2017-12-31 DIAGNOSIS — Z7901 Long term (current) use of anticoagulants: Secondary | ICD-10-CM

## 2017-12-31 DIAGNOSIS — D72829 Elevated white blood cell count, unspecified: Secondary | ICD-10-CM | POA: Diagnosis present

## 2017-12-31 DIAGNOSIS — I340 Nonrheumatic mitral (valve) insufficiency: Secondary | ICD-10-CM | POA: Diagnosis present

## 2017-12-31 DIAGNOSIS — R0689 Other abnormalities of breathing: Secondary | ICD-10-CM | POA: Diagnosis not present

## 2017-12-31 DIAGNOSIS — Z6841 Body Mass Index (BMI) 40.0 and over, adult: Secondary | ICD-10-CM

## 2017-12-31 DIAGNOSIS — E1159 Type 2 diabetes mellitus with other circulatory complications: Secondary | ICD-10-CM | POA: Diagnosis not present

## 2017-12-31 DIAGNOSIS — Z840 Family history of diseases of the skin and subcutaneous tissue: Secondary | ICD-10-CM

## 2017-12-31 DIAGNOSIS — G319 Degenerative disease of nervous system, unspecified: Secondary | ICD-10-CM | POA: Insufficient documentation

## 2017-12-31 DIAGNOSIS — I110 Hypertensive heart disease with heart failure: Secondary | ICD-10-CM | POA: Diagnosis not present

## 2017-12-31 DIAGNOSIS — Z9049 Acquired absence of other specified parts of digestive tract: Secondary | ICD-10-CM

## 2017-12-31 DIAGNOSIS — Y92234 Operating room of hospital as the place of occurrence of the external cause: Secondary | ICD-10-CM | POA: Diagnosis not present

## 2017-12-31 DIAGNOSIS — I959 Hypotension, unspecified: Secondary | ICD-10-CM | POA: Diagnosis not present

## 2017-12-31 DIAGNOSIS — Z794 Long term (current) use of insulin: Secondary | ICD-10-CM | POA: Diagnosis not present

## 2017-12-31 DIAGNOSIS — Z9089 Acquired absence of other organs: Secondary | ICD-10-CM

## 2017-12-31 DIAGNOSIS — Z886 Allergy status to analgesic agent status: Secondary | ICD-10-CM

## 2017-12-31 DIAGNOSIS — I272 Pulmonary hypertension, unspecified: Secondary | ICD-10-CM | POA: Diagnosis present

## 2017-12-31 DIAGNOSIS — I482 Chronic atrial fibrillation, unspecified: Secondary | ICD-10-CM

## 2017-12-31 DIAGNOSIS — I493 Ventricular premature depolarization: Secondary | ICD-10-CM | POA: Diagnosis present

## 2017-12-31 DIAGNOSIS — Z8619 Personal history of other infectious and parasitic diseases: Secondary | ICD-10-CM

## 2017-12-31 DIAGNOSIS — I252 Old myocardial infarction: Secondary | ICD-10-CM

## 2017-12-31 DIAGNOSIS — I63411 Cerebral infarction due to embolism of right middle cerebral artery: Principal | ICD-10-CM | POA: Diagnosis present

## 2017-12-31 DIAGNOSIS — H538 Other visual disturbances: Secondary | ICD-10-CM | POA: Diagnosis present

## 2017-12-31 DIAGNOSIS — R4781 Slurred speech: Secondary | ICD-10-CM | POA: Diagnosis not present

## 2017-12-31 DIAGNOSIS — E1165 Type 2 diabetes mellitus with hyperglycemia: Secondary | ICD-10-CM | POA: Diagnosis not present

## 2017-12-31 DIAGNOSIS — I255 Ischemic cardiomyopathy: Secondary | ICD-10-CM | POA: Diagnosis not present

## 2017-12-31 DIAGNOSIS — E785 Hyperlipidemia, unspecified: Secondary | ICD-10-CM

## 2017-12-31 DIAGNOSIS — I161 Hypertensive emergency: Secondary | ICD-10-CM

## 2017-12-31 DIAGNOSIS — Z978 Presence of other specified devices: Secondary | ICD-10-CM | POA: Diagnosis not present

## 2017-12-31 DIAGNOSIS — G9349 Other encephalopathy: Secondary | ICD-10-CM | POA: Diagnosis not present

## 2017-12-31 DIAGNOSIS — K219 Gastro-esophageal reflux disease without esophagitis: Secondary | ICD-10-CM | POA: Diagnosis present

## 2017-12-31 DIAGNOSIS — I651 Occlusion and stenosis of basilar artery: Secondary | ICD-10-CM | POA: Diagnosis not present

## 2017-12-31 DIAGNOSIS — Z951 Presence of aortocoronary bypass graft: Secondary | ICD-10-CM

## 2017-12-31 DIAGNOSIS — E876 Hypokalemia: Secondary | ICD-10-CM | POA: Diagnosis not present

## 2017-12-31 DIAGNOSIS — Z8249 Family history of ischemic heart disease and other diseases of the circulatory system: Secondary | ICD-10-CM

## 2017-12-31 DIAGNOSIS — R471 Dysarthria and anarthria: Secondary | ICD-10-CM | POA: Diagnosis present

## 2017-12-31 DIAGNOSIS — I6312 Cerebral infarction due to embolism of basilar artery: Secondary | ICD-10-CM | POA: Diagnosis not present

## 2017-12-31 DIAGNOSIS — I10 Essential (primary) hypertension: Secondary | ICD-10-CM | POA: Diagnosis not present

## 2017-12-31 DIAGNOSIS — R402252 Coma scale, best verbal response, oriented, at arrival to emergency department: Secondary | ICD-10-CM | POA: Diagnosis present

## 2017-12-31 DIAGNOSIS — R4701 Aphasia: Secondary | ICD-10-CM | POA: Diagnosis present

## 2017-12-31 DIAGNOSIS — R0902 Hypoxemia: Secondary | ICD-10-CM | POA: Diagnosis not present

## 2017-12-31 DIAGNOSIS — R402421 Glasgow coma scale score 9-12, in the field [EMT or ambulance]: Secondary | ICD-10-CM | POA: Diagnosis not present

## 2017-12-31 DIAGNOSIS — Z515 Encounter for palliative care: Secondary | ICD-10-CM | POA: Diagnosis not present

## 2017-12-31 DIAGNOSIS — Z833 Family history of diabetes mellitus: Secondary | ICD-10-CM

## 2017-12-31 DIAGNOSIS — Z87891 Personal history of nicotine dependence: Secondary | ICD-10-CM

## 2017-12-31 DIAGNOSIS — R402142 Coma scale, eyes open, spontaneous, at arrival to emergency department: Secondary | ICD-10-CM | POA: Diagnosis present

## 2017-12-31 DIAGNOSIS — Z9289 Personal history of other medical treatment: Secondary | ICD-10-CM

## 2017-12-31 DIAGNOSIS — R29810 Facial weakness: Secondary | ICD-10-CM | POA: Diagnosis not present

## 2017-12-31 DIAGNOSIS — Z79899 Other long term (current) drug therapy: Secondary | ICD-10-CM

## 2017-12-31 DIAGNOSIS — J9690 Respiratory failure, unspecified, unspecified whether with hypoxia or hypercapnia: Secondary | ICD-10-CM | POA: Diagnosis not present

## 2017-12-31 DIAGNOSIS — R29714 NIHSS score 14: Secondary | ICD-10-CM | POA: Diagnosis present

## 2017-12-31 DIAGNOSIS — I480 Paroxysmal atrial fibrillation: Secondary | ICD-10-CM | POA: Diagnosis present

## 2017-12-31 DIAGNOSIS — I6340 Cerebral infarction due to embolism of unspecified cerebral artery: Secondary | ICD-10-CM | POA: Diagnosis not present

## 2017-12-31 DIAGNOSIS — I6389 Other cerebral infarction: Secondary | ICD-10-CM | POA: Diagnosis not present

## 2017-12-31 DIAGNOSIS — I639 Cerebral infarction, unspecified: Secondary | ICD-10-CM | POA: Diagnosis present

## 2017-12-31 DIAGNOSIS — I6322 Cerebral infarction due to unspecified occlusion or stenosis of basilar arteries: Secondary | ICD-10-CM | POA: Diagnosis not present

## 2017-12-31 DIAGNOSIS — E1151 Type 2 diabetes mellitus with diabetic peripheral angiopathy without gangrene: Secondary | ICD-10-CM | POA: Diagnosis present

## 2017-12-31 DIAGNOSIS — R402362 Coma scale, best motor response, obeys commands, at arrival to emergency department: Secondary | ICD-10-CM | POA: Diagnosis present

## 2017-12-31 DIAGNOSIS — R509 Fever, unspecified: Secondary | ICD-10-CM | POA: Diagnosis not present

## 2017-12-31 DIAGNOSIS — T502X5A Adverse effect of carbonic-anhydrase inhibitors, benzothiadiazides and other diuretics, initial encounter: Secondary | ICD-10-CM | POA: Diagnosis not present

## 2017-12-31 DIAGNOSIS — E039 Hypothyroidism, unspecified: Secondary | ICD-10-CM | POA: Diagnosis present

## 2017-12-31 DIAGNOSIS — L7632 Postprocedural hematoma of skin and subcutaneous tissue following other procedure: Secondary | ICD-10-CM | POA: Diagnosis not present

## 2017-12-31 DIAGNOSIS — G8191 Hemiplegia, unspecified affecting right dominant side: Secondary | ICD-10-CM | POA: Diagnosis present

## 2017-12-31 DIAGNOSIS — I160 Hypertensive urgency: Secondary | ICD-10-CM | POA: Diagnosis not present

## 2017-12-31 DIAGNOSIS — Z8349 Family history of other endocrine, nutritional and metabolic diseases: Secondary | ICD-10-CM

## 2017-12-31 DIAGNOSIS — I2510 Atherosclerotic heart disease of native coronary artery without angina pectoris: Secondary | ICD-10-CM | POA: Diagnosis present

## 2017-12-31 DIAGNOSIS — R739 Hyperglycemia, unspecified: Secondary | ICD-10-CM | POA: Diagnosis not present

## 2017-12-31 DIAGNOSIS — J988 Other specified respiratory disorders: Secondary | ICD-10-CM | POA: Diagnosis not present

## 2017-12-31 DIAGNOSIS — Z95828 Presence of other vascular implants and grafts: Secondary | ICD-10-CM

## 2017-12-31 DIAGNOSIS — T884XXA Failed or difficult intubation, initial encounter: Secondary | ICD-10-CM | POA: Diagnosis present

## 2017-12-31 DIAGNOSIS — Z950 Presence of cardiac pacemaker: Secondary | ICD-10-CM

## 2017-12-31 DIAGNOSIS — Z86010 Personal history of colonic polyps: Secondary | ICD-10-CM

## 2017-12-31 DIAGNOSIS — Z79891 Long term (current) use of opiate analgesic: Secondary | ICD-10-CM

## 2017-12-31 DIAGNOSIS — Z79890 Hormone replacement therapy (postmenopausal): Secondary | ICD-10-CM

## 2017-12-31 DIAGNOSIS — Z452 Encounter for adjustment and management of vascular access device: Secondary | ICD-10-CM | POA: Diagnosis not present

## 2017-12-31 DIAGNOSIS — E781 Pure hyperglyceridemia: Secondary | ICD-10-CM | POA: Diagnosis present

## 2017-12-31 HISTORY — PX: IR INTRA CRAN STENT: IMG2345

## 2017-12-31 HISTORY — PX: IR PERCUTANEOUS ART THROMBECTOMY/INFUSION INTRACRANIAL INC DIAG ANGIO: IMG6087

## 2017-12-31 HISTORY — PX: IR US GUIDE VASC ACCESS RIGHT: IMG2390

## 2017-12-31 HISTORY — PX: RADIOLOGY WITH ANESTHESIA: SHX6223

## 2017-12-31 LAB — COMPREHENSIVE METABOLIC PANEL
ALT: 17 U/L (ref 0–44)
AST: 20 U/L (ref 15–41)
Albumin: 4.3 g/dL (ref 3.5–5.0)
Alkaline Phosphatase: 50 U/L (ref 38–126)
Anion gap: 8 (ref 5–15)
BILIRUBIN TOTAL: 0.8 mg/dL (ref 0.3–1.2)
BUN: 20 mg/dL (ref 8–23)
CALCIUM: 9.5 mg/dL (ref 8.9–10.3)
CHLORIDE: 103 mmol/L (ref 98–111)
CO2: 26 mmol/L (ref 22–32)
Creatinine, Ser: 0.87 mg/dL (ref 0.44–1.00)
Glucose, Bld: 286 mg/dL — ABNORMAL HIGH (ref 70–99)
Potassium: 4.7 mmol/L (ref 3.5–5.1)
Sodium: 137 mmol/L (ref 135–145)
TOTAL PROTEIN: 8.7 g/dL — AB (ref 6.5–8.1)

## 2017-12-31 LAB — DIFFERENTIAL
Basophils Absolute: 0.1 10*3/uL (ref 0.0–0.1)
Basophils Relative: 1 %
Eosinophils Absolute: 0.2 10*3/uL (ref 0.0–0.7)
Eosinophils Relative: 2 %
Lymphocytes Relative: 20 %
Lymphs Abs: 2.3 10*3/uL (ref 0.7–4.0)
Monocytes Absolute: 1.2 10*3/uL — ABNORMAL HIGH (ref 0.1–1.0)
Monocytes Relative: 10 %
Neutro Abs: 7.8 10*3/uL — ABNORMAL HIGH (ref 1.7–7.7)
Neutrophils Relative %: 67 %

## 2017-12-31 LAB — URINALYSIS, ROUTINE W REFLEX MICROSCOPIC
BILIRUBIN URINE: NEGATIVE
KETONES UR: 5 mg/dL — AB
Leukocytes, UA: NEGATIVE
NITRITE: NEGATIVE
PH: 5 (ref 5.0–8.0)
PROTEIN: NEGATIVE mg/dL
Specific Gravity, Urine: 1.038 — ABNORMAL HIGH (ref 1.005–1.030)

## 2017-12-31 LAB — CBC
HEMATOCRIT: 44 % (ref 36.0–46.0)
Hemoglobin: 14.2 g/dL (ref 12.0–15.0)
MCH: 30.7 pg (ref 26.0–34.0)
MCHC: 32.3 g/dL (ref 30.0–36.0)
MCV: 95 fL (ref 78.0–100.0)
Platelets: 378 10*3/uL (ref 150–400)
RBC: 4.63 MIL/uL (ref 3.87–5.11)
RDW: 13.4 % (ref 11.5–15.5)
WBC: 11.6 10*3/uL — ABNORMAL HIGH (ref 4.0–10.5)

## 2017-12-31 LAB — GLUCOSE, CAPILLARY
GLUCOSE-CAPILLARY: 251 mg/dL — AB (ref 70–99)
Glucose-Capillary: 222 mg/dL — ABNORMAL HIGH (ref 70–99)
Glucose-Capillary: 209 mg/dL — ABNORMAL HIGH (ref 70–99)

## 2017-12-31 LAB — PROTIME-INR
INR: 1.12
Prothrombin Time: 14.4 seconds (ref 11.4–15.2)

## 2017-12-31 LAB — COMPLETE METABOLIC PANEL WITH GFR
AG Ratio: 1.3 (calc) (ref 1.0–2.5)
ALBUMIN MSPROF: 4.3 g/dL (ref 3.6–5.1)
ALKALINE PHOSPHATASE (APISO): 40 U/L (ref 33–130)
ALT: 14 U/L (ref 6–29)
AST: 18 U/L (ref 10–35)
BILIRUBIN TOTAL: 0.8 mg/dL (ref 0.2–1.2)
BUN: 17 mg/dL (ref 7–25)
CHLORIDE: 104 mmol/L (ref 98–110)
CO2: 28 mmol/L (ref 20–32)
Calcium: 9.6 mg/dL (ref 8.6–10.4)
Creat: 0.88 mg/dL (ref 0.60–0.93)
GFR, Est African American: 77 mL/min/{1.73_m2} (ref >=60)
GFR, Est Non African American: 67 mL/min/{1.73_m2} (ref >=60)
GLOBULIN: 3.4 g/dL (ref 1.9–3.7)
Glucose, Bld: 170 mg/dL — ABNORMAL HIGH (ref 65–99)
Potassium: 4.8 mmol/L (ref 3.5–5.3)
SODIUM: 139 mmol/L (ref 135–146)
Total Protein: 7.7 g/dL (ref 6.1–8.1)

## 2017-12-31 LAB — I-STAT CHEM 8, ED
BUN: 22 mg/dL (ref 8–23)
CALCIUM ION: 1.19 mmol/L (ref 1.15–1.40)
Chloride: 105 mmol/L (ref 98–111)
Creatinine, Ser: 0.8 mg/dL (ref 0.44–1.00)
Glucose, Bld: 281 mg/dL — ABNORMAL HIGH (ref 70–99)
HEMATOCRIT: 43 % (ref 36.0–46.0)
HEMOGLOBIN: 14.6 g/dL (ref 12.0–15.0)
Potassium: 4.7 mmol/L (ref 3.5–5.1)
SODIUM: 138 mmol/L (ref 135–145)
TCO2: 24 mmol/L (ref 22–32)

## 2017-12-31 LAB — ETHANOL: Alcohol, Ethyl (B): <10 mg/dL (ref <10)

## 2017-12-31 LAB — POCT I-STAT 3, ART BLOOD GAS (G3+)
ACID-BASE DEFICIT: 6 mmol/L — AB (ref 0.0–2.0)
Acid-base deficit: 6 mmol/L — ABNORMAL HIGH (ref 0.0–2.0)
BICARBONATE: 18.3 mmol/L — AB (ref 20.0–28.0)
Bicarbonate: 18.2 mmol/L — ABNORMAL LOW (ref 20.0–28.0)
O2 SAT: 100 %
O2 SAT: 99 %
PCO2 ART: 31 mmHg — AB (ref 32.0–48.0)
PH ART: 7.377 (ref 7.350–7.450)
PH ART: 7.376 (ref 7.350–7.450)
PO2 ART: 202 mmHg — AB (ref 83.0–108.0)
TCO2: 19 mmol/L — AB (ref 22–32)
TCO2: 19 mmol/L — AB (ref 22–32)
pCO2 arterial: 31.2 mmHg — ABNORMAL LOW (ref 32.0–48.0)
pO2, Arterial: 165 mmHg — ABNORMAL HIGH (ref 83.0–108.0)

## 2017-12-31 LAB — APTT: aPTT: 31 seconds (ref 24–36)

## 2017-12-31 LAB — HEMOGLOBIN A1C
HEMOGLOBIN A1C: 8.4 %{Hb} — AB (ref <5.7)
Mean Plasma Glucose: 194 (calc)
eAG (mmol/L): 10.8 (calc)

## 2017-12-31 LAB — ECHOCARDIOGRAM COMPLETE: HEIGHTINCHES: 61 in

## 2017-12-31 LAB — I-STAT TROPONIN, ED: TROPONIN I, POC: 0.01 ng/mL (ref 0.00–0.08)

## 2017-12-31 LAB — TRIGLYCERIDES: Triglycerides: 143 mg/dL

## 2017-12-31 LAB — MICROALBUMIN / CREATININE URINE RATIO
Creatinine, Urine: 111 mg/dL (ref 20–275)
Microalb Creat Ratio: 60 mcg/mg creat — ABNORMAL HIGH (ref <30)
Microalb, Ur: 6.7 mg/dL

## 2017-12-31 LAB — RAPID URINE DRUG SCREEN, HOSP PERFORMED
Amphetamines: NOT DETECTED
BARBITURATES: NOT DETECTED
Benzodiazepines: NOT DETECTED
COCAINE: NOT DETECTED
OPIATES: NOT DETECTED
Tetrahydrocannabinol: NOT DETECTED

## 2017-12-31 LAB — CBG MONITORING, ED: GLUCOSE-CAPILLARY: 277 mg/dL — AB (ref 70–99)

## 2017-12-31 LAB — MRSA PCR SCREENING: MRSA by PCR: NEGATIVE

## 2017-12-31 SURGERY — RADIOLOGY WITH ANESTHESIA
Anesthesia: General

## 2017-12-31 MED ORDER — SODIUM CHLORIDE 0.9 % IV SOLN: INTRAVENOUS | Status: DC

## 2017-12-31 MED ORDER — ACETAMINOPHEN 325 MG PO TABS
650.0000 mg | ORAL_TABLET | ORAL | Status: DC | PRN
  Filled 2017-12-31: qty 2

## 2017-12-31 MED ORDER — PROPOFOL 10 MG/ML IV BOLUS
INTRAVENOUS | Status: DC | PRN
  Administered 2017-12-31: 40 mg via INTRAVENOUS
  Administered 2017-12-31: 20 mg via INTRAVENOUS
  Administered 2017-12-31: 70 mg via INTRAVENOUS

## 2017-12-31 MED ORDER — HEPARIN SODIUM (PORCINE) 5000 UNIT/ML IJ SOLN
5000.0000 [IU] | Freq: Three times a day (TID) | INTRAMUSCULAR | Status: DC
  Administered 2018-01-01 – 2018-01-09 (×25): 5000 [IU] via SUBCUTANEOUS
  Filled 2017-12-31 (×27): qty 1

## 2017-12-31 MED ORDER — ALBUTEROL SULFATE (2.5 MG/3ML) 0.083% IN NEBU: 2.5000 mg | INHALATION_SOLUTION | Freq: Four times a day (QID) | RESPIRATORY_TRACT | Status: DC | PRN

## 2017-12-31 MED ORDER — LEVOTHYROXINE SODIUM 125 MCG PO TABS
125.0000 ug | ORAL_TABLET | Freq: Every day | ORAL | Status: DC
  Administered 2018-01-01 – 2018-01-10 (×10): 125 ug via ORAL
  Filled 2017-12-31 (×11): qty 1

## 2017-12-31 MED ORDER — CLEVIDIPINE BUTYRATE 0.5 MG/ML IV EMUL
0.0000 mg/h | INTRAVENOUS | Status: DC
  Administered 2017-12-31: 2 mg/h via INTRAVENOUS
  Filled 2017-12-31: qty 50

## 2017-12-31 MED ORDER — LIDOCAINE HCL (CARDIAC) PF 100 MG/5ML IV SOSY
PREFILLED_SYRINGE | INTRAVENOUS | Status: DC | PRN
  Administered 2017-12-31: 60 mg via INTRATRACHEAL

## 2017-12-31 MED ORDER — NITROGLYCERIN 1 MG/10 ML FOR IR/CATH LAB
INTRA_ARTERIAL | Status: AC | PRN
  Administered 2017-12-31: 25 ug via INTRA_ARTERIAL
  Administered 2017-12-31: 200 ug via INTRA_ARTERIAL

## 2017-12-31 MED ORDER — ORAL CARE MOUTH RINSE
15.0000 mL | OROMUCOSAL | Status: DC
  Administered 2017-12-31 – 2018-01-10 (×99): 15 mL via OROMUCOSAL

## 2017-12-31 MED ORDER — TICAGRELOR 90 MG PO TABS
90.0000 mg | ORAL_TABLET | Freq: Two times a day (BID) | ORAL | Status: DC
  Filled 2017-12-31 (×3): qty 1

## 2017-12-31 MED ORDER — PROPOFOL 1000 MG/100ML IV EMUL
5.0000 ug/kg/min | INTRAVENOUS | Status: DC
  Administered 2017-12-31: 15 ug/kg/min via INTRAVENOUS

## 2017-12-31 MED ORDER — ACETAMINOPHEN 160 MG/5ML PO SOLN: 650.0000 mg | ORAL | Status: DC | PRN

## 2017-12-31 MED ORDER — PERFLUTREN LIPID MICROSPHERE
INTRAVENOUS | Status: AC
  Administered 2017-12-31: 3 mL via INTRAVENOUS
  Filled 2017-12-31: qty 10

## 2017-12-31 MED ORDER — ASPIRIN 81 MG PO CHEW
81.0000 mg | CHEWABLE_TABLET | Freq: Every day | ORAL | Status: DC
  Administered 2018-01-01 – 2018-01-10 (×10): 81 mg
  Filled 2017-12-31 (×10): qty 1

## 2017-12-31 MED ORDER — ATORVASTATIN CALCIUM 80 MG PO TABS
80.0000 mg | ORAL_TABLET | Freq: Every day | ORAL | Status: DC
  Administered 2017-12-31 – 2018-01-09 (×10): 80 mg via ORAL
  Filled 2017-12-31 (×10): qty 1

## 2017-12-31 MED ORDER — NITROGLYCERIN 1 MG/10 ML FOR IR/CATH LAB
INTRA_ARTERIAL | Status: AC
  Filled 2017-12-31: qty 10

## 2017-12-31 MED ORDER — SODIUM CHLORIDE 0.9 % IV SOLN
INTRAVENOUS | Status: DC | PRN
  Administered 2017-12-31: 25 ug/min via INTRAVENOUS

## 2017-12-31 MED ORDER — HYDRALAZINE HCL 20 MG/ML IJ SOLN
INTRAMUSCULAR | Status: AC
  Administered 2017-12-31: 20 mg via INTRAVENOUS
  Filled 2017-12-31: qty 1

## 2017-12-31 MED ORDER — ASPIRIN 81 MG PO CHEW: 81.0000 mg | CHEWABLE_TABLET | Freq: Every day | ORAL | Status: DC

## 2017-12-31 MED ORDER — IOHEXOL 300 MG/ML  SOLN
300.0000 mL | Freq: Once | INTRAMUSCULAR | Status: AC | PRN
  Administered 2017-12-31: 175 mL via INTRA_ARTERIAL

## 2017-12-31 MED ORDER — CLEVIDIPINE BUTYRATE 0.5 MG/ML IV EMUL: 0.0000 mg/h | INTRAVENOUS | Status: DC

## 2017-12-31 MED ORDER — NICARDIPINE HCL IN NACL 20-0.86 MG/200ML-% IV SOLN: 3.0000 mg/h | INTRAVENOUS | Status: DC

## 2017-12-31 MED ORDER — HYDRALAZINE HCL 20 MG/ML IJ SOLN
20.0000 mg | Freq: Once | INTRAMUSCULAR | Status: AC
  Administered 2017-12-31: 20 mg via INTRAVENOUS

## 2017-12-31 MED ORDER — SODIUM CHLORIDE 0.9 % IV SOLN
INTRAVENOUS | Status: DC | PRN
  Administered 2017-12-31: 14:00:00 via INTRAVENOUS
  Administered 2017-12-31: 250 mL via INTRAVENOUS

## 2017-12-31 MED ORDER — INSULIN ASPART 100 UNIT/ML ~~LOC~~ SOLN
0.0000 [IU] | Freq: Three times a day (TID) | SUBCUTANEOUS | Status: DC
  Administered 2017-12-31 (×2): 11 [IU] via SUBCUTANEOUS
  Administered 2018-01-01: 7 [IU] via SUBCUTANEOUS

## 2017-12-31 MED ORDER — HEPARIN SODIUM (PORCINE) 1000 UNIT/ML IJ SOLN
INTRAMUSCULAR | Status: AC | PRN
  Administered 2017-12-31: 1000 [IU] via INTRAVENOUS

## 2017-12-31 MED ORDER — SODIUM CHLORIDE 0.9 % IV SOLN
INTRAVENOUS | Status: DC | PRN
  Administered 2017-12-31: 07:00:00 via INTRAVENOUS

## 2017-12-31 MED ORDER — ENALAPRIL MALEATE 10 MG PO TABS
10.0000 mg | ORAL_TABLET | Freq: Every day | ORAL | Status: DC
  Administered 2018-01-01 – 2018-01-08 (×8): 10 mg via ORAL
  Filled 2017-12-31 (×10): qty 1

## 2017-12-31 MED ORDER — TICAGRELOR 90 MG PO TABS
90.0000 mg | ORAL_TABLET | Freq: Two times a day (BID) | ORAL | Status: DC
  Administered 2018-01-01 – 2018-01-10 (×18): 90 mg
  Filled 2017-12-31 (×18): qty 1

## 2017-12-31 MED ORDER — CHLORHEXIDINE GLUCONATE 0.12% ORAL RINSE (MEDLINE KIT)
15.0000 mL | Freq: Two times a day (BID) | OROMUCOSAL | Status: DC
  Administered 2017-12-31 – 2018-01-10 (×20): 15 mL via OROMUCOSAL

## 2017-12-31 MED ORDER — CEFAZOLIN SODIUM-DEXTROSE 2-3 GM-%(50ML) IV SOLR
INTRAVENOUS | Status: DC | PRN
  Administered 2017-12-31: 2 g via INTRAVENOUS

## 2017-12-31 MED ORDER — ACETAMINOPHEN 325 MG PO TABS: 650.0000 mg | ORAL_TABLET | ORAL | Status: DC | PRN

## 2017-12-31 MED ORDER — ACETAMINOPHEN 650 MG RE SUPP
650.0000 mg | RECTAL | Status: DC | PRN
  Filled 2017-12-31: qty 1

## 2017-12-31 MED ORDER — HEPARIN SODIUM (PORCINE) 1000 UNIT/ML IJ SOLN
INTRAMUSCULAR | Status: AC
  Filled 2017-12-31: qty 1

## 2017-12-31 MED ORDER — CLEVIDIPINE BUTYRATE 0.5 MG/ML IV EMUL
0.0000 mg/h | INTRAVENOUS | Status: DC
  Administered 2017-12-31: 2 mg/h via INTRAVENOUS
  Administered 2017-12-31: 3 mg/h via INTRAVENOUS
  Filled 2017-12-31 (×2): qty 50

## 2017-12-31 MED ORDER — ACETAMINOPHEN 160 MG/5ML PO SOLN
650.0000 mg | ORAL | Status: DC | PRN
  Administered 2018-01-01 – 2018-01-10 (×11): 650 mg
  Filled 2017-12-31 (×12): qty 20.3

## 2017-12-31 MED ORDER — TICAGRELOR 60 MG PO TABS
ORAL_TABLET | ORAL | Status: AC | PRN
  Administered 2017-12-31: 180 mg

## 2017-12-31 MED ORDER — PHENYLEPHRINE HCL 10 MG/ML IJ SOLN
INTRAMUSCULAR | Status: DC | PRN
  Administered 2017-12-31: 40 ug via INTRAVENOUS
  Administered 2017-12-31 (×7): 80 ug via INTRAVENOUS

## 2017-12-31 MED ORDER — VERAPAMIL HCL 2.5 MG/ML IV SOLN
INTRAVENOUS | Status: AC
  Filled 2017-12-31: qty 2

## 2017-12-31 MED ORDER — FAMOTIDINE IN NACL 20-0.9 MG/50ML-% IV SOLN
20.0000 mg | INTRAVENOUS | Status: DC
  Administered 2017-12-31: 20 mg via INTRAVENOUS
  Filled 2017-12-31: qty 50

## 2017-12-31 MED ORDER — PERFLUTREN LIPID MICROSPHERE
1.0000 mL | INTRAVENOUS | Status: AC | PRN
  Administered 2017-12-31: 3 mL via INTRAVENOUS
  Filled 2017-12-31: qty 10

## 2017-12-31 MED ORDER — ACETAMINOPHEN 650 MG RE SUPP: 650.0000 mg | RECTAL | Status: DC | PRN

## 2017-12-31 MED ORDER — CARVEDILOL 25 MG PO TABS
25.0000 mg | ORAL_TABLET | Freq: Two times a day (BID) | ORAL | Status: DC
  Administered 2017-12-31 – 2018-01-09 (×17): 25 mg via ORAL
  Filled 2017-12-31: qty 1
  Filled 2017-12-31 (×2): qty 2
  Filled 2017-12-31 (×2): qty 1
  Filled 2017-12-31: qty 2
  Filled 2017-12-31 (×3): qty 1
  Filled 2017-12-31: qty 2
  Filled 2017-12-31: qty 1
  Filled 2017-12-31 (×3): qty 2
  Filled 2017-12-31: qty 1
  Filled 2017-12-31: qty 2
  Filled 2017-12-31 (×9): qty 1
  Filled 2017-12-31: qty 2
  Filled 2017-12-31: qty 1
  Filled 2017-12-31 (×2): qty 2
  Filled 2017-12-31: qty 1
  Filled 2017-12-31 (×2): qty 2
  Filled 2017-12-31 (×2): qty 1
  Filled 2017-12-31 (×3): qty 2

## 2017-12-31 MED ORDER — PROPOFOL 1000 MG/100ML IV EMUL: 5.0000 ug/kg/min | INTRAVENOUS | Status: DC

## 2017-12-31 MED ORDER — SODIUM CHLORIDE 0.9 % IV SOLN
INTRAVENOUS | Status: DC
  Administered 2017-12-31 – 2018-01-01 (×4): via INTRAVENOUS

## 2017-12-31 MED ORDER — VERAPAMIL HCL 2.5 MG/ML IV SOLN
INTRAVENOUS | Status: AC | PRN
  Administered 2017-12-31: 2.5 mg via INTRAVENOUS

## 2017-12-31 MED ORDER — FENTANYL CITRATE (PF) 100 MCG/2ML IJ SOLN
INTRAMUSCULAR | Status: DC | PRN
  Administered 2017-12-31: 100 ug via INTRAVENOUS

## 2017-12-31 MED ORDER — ASPIRIN 81 MG PO CHEW
CHEWABLE_TABLET | ORAL | Status: AC | PRN
  Administered 2017-12-31: 81 mg

## 2017-12-31 MED ORDER — STROKE: EARLY STAGES OF RECOVERY BOOK
Freq: Once | Status: DC
  Filled 2017-12-31: qty 1

## 2017-12-31 MED ORDER — SUCCINYLCHOLINE CHLORIDE 20 MG/ML IJ SOLN
INTRAMUSCULAR | Status: DC | PRN
  Administered 2017-12-31: 100 mg via INTRAVENOUS

## 2017-12-31 MED ORDER — ROCURONIUM BROMIDE 50 MG/5ML IV SOSY
PREFILLED_SYRINGE | INTRAVENOUS | Status: DC | PRN
  Administered 2017-12-31: 20 mg via INTRAVENOUS
  Administered 2017-12-31: 50 mg via INTRAVENOUS
  Administered 2017-12-31: 30 mg via INTRAVENOUS

## 2017-12-31 MED ORDER — IOPAMIDOL (ISOVUE-370) INJECTION 76%
100.0000 mL | Freq: Once | INTRAVENOUS | Status: AC | PRN
  Administered 2017-12-31: 100 mL via INTRAVENOUS

## 2017-12-31 MED ORDER — SODIUM CHLORIDE 0.9 % IV BOLUS
500.0000 mL | Freq: Once | INTRAVENOUS | Status: AC
  Administered 2017-12-31: 500 mL via INTRAVENOUS

## 2017-12-31 NOTE — ED Notes (Signed)
Lab personnel states labs are off by one hour due to merging of charts; phlebotomy arrived at [phone]

## 2017-12-31 NOTE — Sedation Documentation (Signed)
Hematoma to R mid fa measured by MD with ultrasound who states it measures 5x6 cm

## 2017-12-31 NOTE — Progress Notes (Signed)
Spoke with on call neuro MD regarding difference in BP cuff and Aline as well as BP parameters.  Orders given to go by cuff and systolic pressure between 120-140.

## 2017-12-31 NOTE — Progress Notes (Signed)
  Echocardiogram 2D Echocardiogram has been performed.  [name], [name] 08-15-17, 11:17 AM

## 2017-12-31 NOTE — Progress Notes (Addendum)
NEUROHOSPITALISTS STROKE TEAM - DAILY PROGRESS NOTE    HISTORY [name] [name] is a 72 y.o. female with a history of PVD and CHF as well as afib on eliquis who was last seen by family around 7:30 PM.  She is severely dysarthric, but is able to understand and nod/shake her head and when I asked her if she was normal when she awoke at 1 AM she indicates that she was developed symptoms after that.  I am uncertain of the exact time of onset, however.  She has a history of CHF and peripheral vascular disease and due to this, she is not able to walk long distance, but she does not need any help with any of her activities of daily living.   LKW: 1 AM tpa given?: no, out of window Modified Rankin Scale: 2-Slight disability-UNABLE to perform all activities but does not need assistance NIHSS:13  SUBJECTIVE Patient currently intubated and minimally sedated on propofol.  She was seen post neuro intervention in the ICU.  She is unresponsive butdoes very minimally withdrawal bilateral lower extremities and left arm to stimulus.  Patient very hypertensive postprocedure and currently Cleviprex drip has been initiated   OBJECTIVE Most recent Vital Signs: Vitals:   2018-01-05 1100 Jan 05, 2018 1102 01/05/2018 1130 01-05-18 1200  BP: (!) 105/56 115/60  (!) 109/45  Pulse: 72 78  70  Resp: 16 (!) 21  (!) 25  Temp:      TempSrc:      SpO2:  100%    Weight:   112.3 kg   Height:       CBG (last 3)  Recent Labs    01-05-18 0448 01-05-2018 1136  GLUCAP 277* 222*    Physical Exam  HEENT-intubated.   Cardiovascular- irregularly irregular heart rhythm Lungs-vented breath sounds.  Saturations within normal limits Abdomen-obese Musculoskeletal: Pulses weak in right lower extremities, only audible with Doppler and +1 in left lower extremity Skin-warm and dry.  Left groin dressing clean dry and intact no hematoma noted  Neuro:  Mental Status: Patient  currently is sedated and intubated and unable to follow commands.   Cranial Nerves: Ocular: right pupil very dilated and fixed, left 4 mm very sluggish-patient with disconjugate pupils with right eye hypotropia. Corneal reflexes are depressed bilaterally. Doll's eye reflexes are sluggish. She does have a weak gag. She does breathe above the ventilator. VII: No facial asymmetry noted  Motor: Patient currently sedated, but does minimally  withdrawal bilateral lower extremities and wiggle toes to harsh stimulus she also does very minimally withdrawal left arm to stimulus Tone and bulk:normal tone throughout; no atrophy noted Sensory: Pinprick and light touch intact throughout, bilaterally Cerebellar and Gait: Not tested  IV Fluid Intake:   . sodium chloride    . sodium chloride 75 mL/hr at 2018/01/05 1100  . clevidipine Stopped (Jan 05, 2018 1124)  . famotidine (PEPCID) IV    . propofol (DIPRIVAN) infusion      MEDICATIONS  .  stroke: mapping our early stages of recovery book   Does not apply Once  . [START ON 01/01/2018] aspirin  81 mg Oral Daily   Or  . [START ON 01/01/2018] aspirin  81 mg Per  Tube Daily  . carvedilol  25 mg Oral BID WC  . enalapril  10 mg Oral Daily  . heparin      . [START ON 01/01/2018] heparin injection (subcutaneous)  5,000 Units Subcutaneous Q8H  . insulin aspart  0-20 Units Subcutaneous TID WC  . levothyroxine  125 mcg Oral QAC breakfast  . nitroGLYCERIN      . nitroGLYCERIN      . [START ON 01/01/2018] ticagrelor  90 mg Oral BID   Or  . [START ON 01/01/2018] ticagrelor  90 mg Per Tube BID  . verapamil       PRN:  acetaminophen **OR** acetaminophen (TYLENOL) oral liquid 160 mg/5 mL **OR** acetaminophen, albuterol  Diet:   Diet Order            Diet NPO time specified  Diet effective now              CLINICALLY SIGNIFICANT STUDIES Basic Metabolic Panel:  Recent Labs  Lab 12/30/17 1004 15-Jan-2018 0450 15-Jan-2018 0453  NA 139 137 138  K 4.8 4.7 4.7  CL 104  103 105  CO2 28 26  --   GLUCOSE 170* 286* 281*  BUN 17 20 22   CREATININE 0.88 0.87 0.80  CALCIUM 9.6 9.5  --    Liver Function Tests:  Recent Labs  Lab 12/30/17 1004 01-15-18 0450  AST 18 20  ALT 14 17  ALKPHOS  --  50  BILITOT 0.8 0.8  PROT 7.7 8.7*  ALBUMIN  --  4.3   CBC:  Recent Labs  Lab January 15, 2018 0450 2018/01/15 0453  WBC 11.6*  --   NEUTROABS 7.8*  --   HGB 14.2 14.6  HCT 44.0 43.0  MCV 95.0  --   PLT 378  --    Coagulation:  Recent Labs  Lab 01-15-2018 0450  LABPROT 14.4  INR 1.12    Lipid Panel    Component Value Date/Time   CHOL 179 08/09/2017 0903   TRIG 164 (H) 08/09/2017 0903   HDL 33 (L) 08/09/2017 0903   CHOLHDL 5.4 (H) 08/09/2017 0903   VLDL 25 11/18/2016 1000   LDLCALC 118 (H) 08/09/2017 0903   HgbA1C  Lab Results  Component Value Date   HGBA1C 8.4 (H) 12/30/2017    Urine Drug Screen:  No results found for: LABOPIA, COCAINSCRNUR, LABBENZ, AMPHETMU, THCU, LABBARB  Alcohol Level:  Recent Labs  Lab January 15, 2018 0450  ETH <10    Ct Angio Head/ Neck W Or Wo Contrast/ CT Perfusion  01-15-2018 IMPRESSION:  1. 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 Superior cerebellar arteries not visualized. PCAs are perfused at this time via collateralization from small posterior communicating arteries. 2. No acute core infarct identified by CT perfusion. 3. Approximate 50% stenosis at the origin of the right vertebral artery. Right vertebral artery dominant and otherwise widely patent to the vertebrobasilar junction. 4. Poor opacification of the distal left V2 segment, which may be occluded. Reconstitution at the V3 segment, which remains patent to the cranial vault. Multifocal moderate V4 stenoses as above. 5. Atheromatous stenoses of up to approximately 70-80% about the carotid bifurcations bilaterally. Evaluation limited by prominent calcifications and motion artifact on this exam. 6. Extensive carotid siphon  atherosclerosis with moderate diffuse narrowing bilaterally. 7. 60% stenosis at the origin of the left subclavian artery. 8. Patchy multifocal consolidative opacities within the visualized lungs, concerning for pneumonia. Evidence for underlying pulmonary interstitial edema.   Dg Chest  1 View 01/03/2018 IMPRESSION: Tube positions as described without pneumothorax. Pulmonary vascular congestion. Mild perihilar pulmonary edema. No frank consolidation. Left base atelectasis noted. Postoperative changes noted. Electronically Signed   By: Bretta BangWilliam  Woodruff III M.D.   On: 12/20/2017 12:16   Ct Head Wo Contrast 12/15/2017 IMPRESSION: 1. Involving nonhemorrhagic infarct involving the left occipital lobe is described. This is consistent with acute left PCA occlusion. 2. No other acute or focal cortical or basal ganglia infarct evident. 3. Moderate diffuse white matter disease. 4. Anterior ethmoid and frontal sinus disease may be related to intubation.  EKG :A. 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  Patient with left PCA occlusion with involvement nonhemorrhagic infarct.  Patient status post  Mechanical thrombectomy with resultant complete revascularization of occluded mid to distal basilar artery with Placement of rescue stent across RT PCA ,Bilateral Pcoms,   Code Stroke: Yes  CTA head & neck : Positive CTA for evolving LVO with occlusive thrombus within the distal basilar artery. Superior cerebellar arteries not visualized. Approximate 50% stenosis at the origin of the right vertebral artery,  Atheromatous stenoses of up to approximately 70-80% about the carotid bifurcations bilaterally. Evaluation limited by prominent calcifications and motion artifact on this exam.  Extensive carotid siphon  atherosclerosis with moderate diffuse narrowing bilaterally.   MRI of the brain : not performed due to pacemaker  Repeat Head CT 9/20: Evolving Left PCA nonhemorrhagic infarct. No other acute or focal cortical or basal ganglia infarct evident  2D Echocardiogram : Pending  CXR : Pulmonary vascular congestion. Mild perihilar pulmonary edema. No frank consolidation.  LDL 118  HgbA1c 8.4  VTE: SCD  Antiplatelets : Aspirin 81 mg daily/Brilinta 90 mg twice daily  Atorvastatin 80 mg when tolerating PO  Continue Rehab with PT consult, OT consult, Speech consult  Therapy recommendations:   Pending  Disposition:   Pending  Patient currently sedated and intubated in the ICU post procedure.  Repeat CT of the head did not show acute hemorrhage but did continue to show  Left PCA infarcts  CCM consulted for management of critical comorbidities   Atrial Fibrillation  Home anticoagulation:   Eliquis  Home medications, Betapace   CHA2DS2-VASc Score = 6             Age in Years:  ?789                         Sex:  Female   Female                      Hypertension History:  yes                          Diabetes Mellitus:  0             Congestive Heart Failure History:  0             Vascular Disease History:  yes  Stroke/TIA/Thromboembolism History:  yes     Meds: Coreg   Hypertension urgency  Elevated  BP goal over night 140-160 especially in the setting of patient with recent neuro intervention  Home medications: Enalapril, Coreg  Patient started on Cleviprex drip to maintain blood pressure goal  Hyperlipidemia  Home meds:   None  LDL 1180goal < 70  Add lipitor 80mg  daily  Continue statin at discharge   Uncontrolled diabetes mellitus  Start Accu-Chek every 6 hours with sliding scale while n.p.o.  CHF/CAD and patient with previous CABG  Currently started on aspirin and Brilinta post cardiac  Stent  Continue cardioprotective  medications  Continue home Lasix regimen as needed  Acute respiratory insufficiency in setting of cerebral infarction  Patient intubated, vent management by CCM  X-ray chest showed pulmonary vascular congestion  Other Stroke Risk Factors  Advanced age  Former smoker  PVD  CAD   Other Active Problems  GERD-continue PPI  Hypothyroidism-continue Synthroid   Hospital day # 0  SIGNED Noralee Space [facility] Neuro-hospitalist Team [phone] 01/08/2018, 12:57 PM   01/06/2018 ATTENDING ASSESSMENT   I have personally examined this patient, reviewed notes, independently viewed imaging studies, participated in medical decision making and plan of care.ROS completed by me personally and pertinent positives fully documented  I have made any additions or clarifications directly to the above note. Agree with note above.she presented with distal basilar artery embolic occlusion and has undergone emergent thrombectomy requiring placement of stent. Her neurological exam as well as CT scan done after procedure suggest left greater than then right occipital and possibly right cerebellar infarcts. Her prognosis is guarded. Recommend continuing ventilatory support and sedation overnight and strict control of blood pressure with systolic blood pressure goal between 1 20-1 40. Repeat CT scan tomorrow and follow neurological exam to make decisions about prognosis and withdrawing ventilatory support. I had a long discussion with the patient's son and daughter at the bedside and discuss her prognosis and answered questions about plan of care. Discussed with Dr. Molli Knock critical care medicine.This patient is critically ill and at significant risk of neurological worsening, death and care requires constant monitoring of vital signs, hemodynamics,respiratory and cardiac monitoring, extensive review of multiple databases, frequent neurological assessment, discussion with family, other specialists and medical decision  making of high complexity.I have made any additions or clarifications directly to the above note.This critical care time does not reflect procedure time, or teaching time or supervisory time of PA/NP/Med Resident etc but could involve care discussion time.  I spent 60 minutes of neurocritical care time  in the care of  this patient.      Delia Heady, MD Medical Director [facility] Pager: [phone] 12/29/2017 3:00 PM     To contact Stroke Continuity provider, please refer to [email or web address]. After hours, contact General Neurology

## 2017-12-31 NOTE — Progress Notes (Signed)
Patient is unable to have a MRI due to unsafe pacemaker. Please call MRI with any questions.

## 2017-12-31 NOTE — Anesthesia Procedure Notes (Signed)
Arterial Line Insertion Start/End09/17/2019 6:49 AM, 12/31/2017 6:54 AM Performed by: Val EagleMoser, Kosta Schnitzler, MD  Patient location: OR. Preanesthetic checklist: patient identified, IV checked, site marked, risks and benefits discussed, surgical consent, monitors and equipment checked, pre-op evaluation, timeout performed and anesthesia consent Left, radial was placed Catheter size: 20 G Hand hygiene performed  and maximum sterile barriers used   Attempts: 1 Procedure performed without using ultrasound guided technique. Following insertion, dressing applied and Biopatch. Post procedure assessment: normal and unchanged  Patient tolerated the procedure well with no immediate complications.

## 2017-12-31 NOTE — Anesthesia Postprocedure Evaluation (Signed)
Anesthesia Post Note  Patient: [name] [name]  Procedure(s) Performed: RADIOLOGY WITH ANESTHESIA (N/A )     Patient location during evaluation: ICU Anesthesia Type: General Level of consciousness: patient remains intubated per anesthesia plan Pain management: pain level controlled Vital Signs Assessment: post-procedure vital signs reviewed and stable Respiratory status: patient on ventilator - see flowsheet for VS Cardiovascular status: stable Postop Assessment: no apparent nausea or vomiting Anesthetic complications: no    Last Vitals:  Vitals:   Mar 15, 2018 0950 Mar 15, 2018 1000  BP:  (!) 68/46  Pulse:  73  Resp:  17  Temp:  (!) 36.1 C  SpO2: 100% 100%    Last Pain:  Vitals:   Mar 15, 2018 1000  TempSrc: Rectal                 Renetta Suman A.

## 2017-12-31 NOTE — Progress Notes (Signed)
Patient ID: [name], female   DOB: 09/18/1945, 72 y.o.   MRN: [id number] INR. 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 1 am Found with severe dysarthria and Lt sided weakness. CT Brain. No ICH.ASPECTS 10 .CTA occluded dmid to distal basilar A and prox PCAs. CTP Rapid . Bilateral hemi shere hypoperfusion probably related to low cardiac output.  \\Severe bilateral LE ASPD.  Endovascular revascularization of occluded basilar arterry D/W son. Reasons,risks,alternatives reviewed. Riskds of ICH 10 %,worsenong neuro condition,ventdependency,death inability to  revascularize,vascural injury all discussed.Questioons answere to patients understanding. Informed witnessed consent obtained  From son. S.Janese Radabaugh MD

## 2017-12-31 NOTE — ED Provider Notes (Signed)
[facility] EMERGENCY DEPARTMENT Provider Note   CSN: [id number] Arrival date & time: 01/02/2018  4098   An emergency department physician performed an initial assessment on this suspected stroke patient at 0434.  History   Chief Complaint Chief Complaint  Patient presents with  . Code Stroke    HPI [name] [name] is a 72 y.o. female.  Patient presents to the emergency department under from home by EMS.  Code stroke has been initiated by EMS for right-sided deficit.  Last known normal is unclear, patient and family indicated that she had onset of symptoms around 1 AM this is not certain.  Patient having severe difficulty communicating at arrival.Level V Caveat due to acuity.     Past Medical History:  Diagnosis Date  . Atrial fibrillation (HCC)   . Chronic systolic heart failure (HCC)   . Colon polyps    20 yrs ago  . Coronary atherosclerosis of native coronary artery    Multivessel, patent grafts 2007  . Diabetes mellitus, type 2 (HCC)   . Essential hypertension, benign   . Hyperlipidemia   . Hypothyroidism   . Hypothyroidism   . Ischemic cardiomyopathy    Ejection Fraction of 25% improved to 45-50% following biventicular pacing, Status post biventricular dual-chamber ICD - [employer]  . Myocardial infarction (HCC)   . PVD (peripheral vascular disease) (HCC)   . Status cardiac pacemaker   . Ulcer     Patient Active Problem List   Diagnosis Date Noted  . Vitamin D deficiency 08/19/2017  . Nodular goiter 07/16/2016  . Charcot's joint disease due to secondary diabetes (HCC) 12/18/2015  . 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 Peripheral vascular disease, unspecified (HCC) 09/14/2013  . Atherosclerosis of native arteries of the extremities with ulceration(440.23) 07/11/2013  . Atrial fibrillation (HCC) 09/12/2012  . Fracture of one rib, left side, subsequent encounter for fracture with nonunion 02/25/2012  . Background retinopathy due to secondary  diabetes (HCC) 05/20/2010  . Biventricular cardiac pacemaker in situ 06/05/2009  . Class 2 severe obesity due to excess calories with serious comorbidity and body mass index (BMI) of 35.0 to 35.9 in adult [facility]) 12/06/2008  . Coronary atherosclerosis of native coronary artery 12/05/2008  . Secondary cardiomyopathy, unspecified 10/17/2008  . Hypothyroidism 04/04/2008  . Type 2 diabetes mellitus with vascular disease (HCC) 04/04/2008  . Mixed hyperlipidemia 04/04/2008  . Essential hypertension, benign 04/04/2008    Past Surgical History:  Procedure Laterality Date  . ABDOMINAL AORTAGRAM N/A 08/04/2013   Procedure: ABDOMINAL Ronny Flurry;  Surgeon: Sherren Kerns, MD;  Location: [facility] CATH LAB;  Service: Cardiovascular;  Laterality: N/A;  . APPENDECTOMY    . BI-VENTRICULAR PACEMAKER INSERTION N/A 11/20/2013   Procedure: BI-VENTRICULAR PACEMAKER INSERTION (CRT-P);  Surgeon: Marinus Maw, MD;  Location: [facility] CATH LAB;  Service: Cardiovascular;  Laterality: N/A;  . Bilateral tubal ligation    . CARDIAC DEFIBRILLATOR PLACEMENT     [employer] [place]  . CESAREAN SECTION    . CORONARY ARTERY BYPASS GRAFT  2001   [facility] - LIMA to LAD, SVG to ramus, SVG to OM/PLB, SVG to RCA   . FLEXIBLE SIGMOIDOSCOPY  07/19/1998   Dr. Karilyn Cota  . TONSILLECTOMY       OB History   None      Home Medications    Prior to Admission medications   Medication Sig Start Date End Date Taking? Authorizing Provider  ACCU-CHEK AVIVA PLUS test strip USE TO TEST GLUCOSE 4 TIMES A DAY  12/27/17   Roma KayserNida, Gebreselassie W, MD  ALPRAZolam Prudy Feeler(XANAX) 1 MG tablet TAKE ONE-HALF TO ONE TABLET BY MOUTH TWOTIMES A DAY AS NEEDED FOR ANXIETY 10/07/17   Campbell RichesHoskins, Carolyn C, NP  apixaban (ELIQUIS) 5 MG TABS tablet Take 1 tablet (5 mg total) by mouth 2 (two) times daily. 11/05/16   Marinus Mawaylor, Gregg W, MD  Blood Glucose Monitoring Suppl (ACCU-CHEK AVIVA) device Use as instructed 05/06/15   Roma KayserNida, Gebreselassie W, MD  carvedilol (COREG) 25 MG  tablet TAKE ONE TABLET TWICE DAILY WITH A MEAL 11/05/16   Marinus Mawaylor, Gregg W, MD  Cholecalciferol (VITAMIN D3) 5000 units CAPS Take 1 capsule (5,000 Units total) by mouth daily. 08/19/17   Roma KayserNida, Gebreselassie W, MD  Continuous Blood Gluc Sensor (FREESTYLE LIBRE SENSOR SYSTEM) MISC Use one sensor every 10 days. 12/24/16   Roma KayserNida, Gebreselassie W, MD  enalapril (VASOTEC) 10 MG tablet Take 1 tablet (10 mg total) by mouth 2 (two) times daily. Patient taking differently: Take 10 mg by mouth daily.  11/05/16   Marinus Mawaylor, Gregg W, MD  furosemide (LASIX) 40 MG tablet Take 1 tablet (40 mg total) by mouth 2 (two) times daily. 11/05/16   Marinus Mawaylor, Gregg W, MD  glucose blood test strip Accu-Chek Aviva Plus test strips    [provider]  insulin lispro (HUMALOG KWIKPEN) 100 UNIT/ML KiwkPen Inject 0.24-0.3 mLs (24-30 Units total) into the skin 3 (three) times daily. 08/19/17   Roma KayserNida, Gebreselassie W, MD  insulin lispro (HUMALOG KWIKPEN) 100 UNIT/ML KiwkPen Inject 0.24-0.3 mLs (24-30 Units total) into the skin 3 (three) times daily. 12/08/17   Roma KayserNida, Gebreselassie W, MD  Insulin Pen Needle (B-D ULTRAFINE III SHORT PEN) 31G X 8 MM MISC Use 5 x daily as directed 09/02/16   Roma KayserNida, Gebreselassie W, MD  LEVEMIR FLEXTOUCH 100 UNIT/ML Pen INJECT 80 UNITS INTO THE SKIN DAILY AT 10:00PM 08/11/17   Roma KayserNida, Gebreselassie W, MD  multivitamin [facility](THERAGRAN) per tablet Take 1 tablet by mouth daily.      [provider]  Omega-3 Fatty Acids (FISH OIL) 1000 MG CAPS Take 1,000 mg by mouth daily.    [provider]  OVER THE COUNTER MEDICATION Take 1 tablet by mouth daily. Phyomega vitamin.    [provider]  potassium chloride SA (K-DUR,KLOR-CON) 20 MEQ tablet Take 1 tablet (20 mEq total) by mouth 2 (two) times daily. 06/10/17   Jonelle SidleMcDowell, Samuel G, MD  SYNTHROID 125 MCG tablet TAKE ONE (1) TABLET BY MOUTH EVERY DAY 10/07/17   Roma KayserNida, Gebreselassie W, MD  traMADol Janean Sark(ULTRAM) 50 MG tablet TAKE ONE TABLET ONCE DAILY FOR MODERATE  PAIN 06/11/17   Merlyn AlbertLuking, William S, MD  VICTOZA 18 MG/3ML SOPN INJECT 0.3ML'S (1.8MG  TOTAL) INTO THE SKIN ONCE DAILY 07/12/17   Roma KayserNida, Gebreselassie W, MD  vitamin E 600 UNIT capsule Take 600 Units by mouth daily.      [provider]    Family History Family History  Problem Relation Age of Onset  . Diabetes Mother   . Ulcers Mother   . Heart disease Mother        before age 70  . Hyperlipidemia Mother   . Hypertension Mother   . Diabetes Father   . Heart attack Father   . Hyperlipidemia Father   . Hypertension Father   . Heart disease Father        before age 70  . AAA (abdominal aortic aneurysm) Father   . Diabetes Sister   . Heart disease Sister  before age 48  . Heart attack Sister   . Coronary artery disease Sister 45    Social History Social History   Tobacco Use  . Smoking status: Former Smoker    Last attempt to quit: 09/16/1991    Years since quitting: 26.3  . Smokeless tobacco: Never Used  Substance Use Topics  . Alcohol use: No    Alcohol/week: 0.0 standard drinks  . Drug use: No     Allergies   Rosiglitazone; Tapentadol; Tape; and Celecoxib   Review of Systems Review of Systems  Unable to perform ROS: Acuity of condition     Physical Exam Updated Vital Signs BP (!) 141/75   Pulse 70   Resp 16   LMP  (LMP Unknown)   SpO2 94%   Physical Exam  Constitutional: She appears well-developed and well-nourished. She appears distressed.  HENT:  Head: Normocephalic and atraumatic.  Right Ear: Hearing normal.  Left Ear: Hearing normal.  Nose: Nose normal.  Mouth/Throat: Oropharynx is clear and moist and mucous membranes are normal.  Eyes: Pupils are equal, round, and reactive to light. Conjunctivae and EOM are normal.  Neck: Normal range of motion. Neck supple.  Cardiovascular: Regular rhythm, S1 normal and S2 normal. Exam reveals no gallop and no friction rub.  No murmur heard. Pulmonary/Chest: Effort normal and breath sounds normal. No  respiratory distress. She exhibits no tenderness.  Abdominal: Soft. Normal appearance and bowel sounds are normal. There is no hepatosplenomegaly. There is no tenderness. There is no rebound, no guarding, no tenderness at McBurney's point and negative Murphy's sign. No hernia.  Neurological: She is alert. A cranial nerve deficit and sensory deficit is present. She exhibits abnormal muscle tone. Coordination normal. GCS eye subscore is 4. GCS verbal subscore is 5. GCS motor subscore is 6.  Flaccid RUE, RLE  Skin: Skin is warm, dry and intact. No rash noted. No cyanosis.  Psychiatric: She has a normal mood and affect. Her speech is normal and behavior is normal. Thought content normal.  Nursing note and vitals reviewed.    ED Treatments / Results  Labs (all labs ordered are listed, but only abnormal results are displayed) Labs Reviewed  CBC - Abnormal; Notable for the following components:      Result Value   WBC 11.6 (*)    All other components within normal limits  DIFFERENTIAL - Abnormal; Notable for the following components:   Neutro Abs 7.8 (*)    Monocytes Absolute 1.2 (*)    All other components within normal limits  COMPREHENSIVE METABOLIC PANEL - Abnormal; Notable for the following components:   Glucose, Bld 286 (*)    Total Protein 8.7 (*)    All other components within normal limits  CBG MONITORING, ED - Abnormal; Notable for the following components:   Glucose-Capillary 277 (*)    All other components within normal limits  I-STAT CHEM 8, ED - Abnormal; Notable for the following components:   Glucose, Bld 281 (*)    All other components within normal limits  ETHANOL  PROTIME-INR  APTT  RAPID URINE DRUG SCREEN, HOSP PERFORMED  URINALYSIS, ROUTINE W REFLEX MICROSCOPIC  I-STAT TROPONIN, ED  I-STAT TROPONIN, ED    EKG None  Radiology Ct Angio Head W Or Wo Contrast  Result Date: 2018-01-03 CLINICAL DATA:  Initial evaluation for right-sided neglect, slurred speech.  EXAM: CT ANGIOGRAPHY HEAD AND NECK CT PERFUSION BRAIN TECHNIQUE: Multidetector CT imaging of the head and neck was performed using the standard  protocol during bolus administration of intravenous contrast. Multiplanar CT image reconstructions and MIPs were obtained to evaluate the vascular anatomy. Carotid stenosis measurements (when applicable) are obtained utilizing NASCET criteria, using the distal internal carotid diameter as the denominator. Multiphase CT imaging of the brain was performed following IV bolus contrast injection. Subsequent parametric perfusion maps were calculated using RAPID software. CONTRAST:  ISOVUE-370 IOPAMIDOL (ISOVUE-370) INJECTION 76% COMPARISON:  None available at time of this dictation. FINDINGS: CTA NECK FINDINGS Aortic arch: Examination technically limited by motion artifact. Visualized aortic arch of normal caliber with normal branch pattern. Moderate atherosclerotic change about the aortic arch and origin of the great vessels. Approximate 60% stenosis at the origin of the left subclavian artery. No other flow-limiting stenosis about the origin of the great vessels. Visualized subclavian arteries otherwise grossly patent. Right carotid system: Focal plaque at the origin of the right common carotid artery with associated 60% stenosis. Right common carotid artery otherwise patent to the bifurcation without flow-limiting stenosis. Extensive plaque about the right bifurcation/proximal right ICA. Resultant fairly severe stenosis of up to approximately 70-80%, although exact termination difficult given prominent calcifications and motion artifact within this region. Right ICA patent distally to the skull base. Left carotid system: Left common carotid artery patent from its origin to the bifurcation without stenosis. Prominent calcification about the left bifurcation/proximal left ICA with associated stenosis of up to approximately 75% by NASCET criteria, although again, evaluation  limited. Left ICA patent distally to the skull base without stenosis, dissection or occlusion. Vertebral arteries: Both of the vertebral arteries arise from the subclavian arteries. Right vertebral artery dominant. Focal plaque at the origin of the right vertebral artery with suspected approximate 50% stenosis. Right vertebral artery otherwise patent to the skull base. Left vertebral artery somewhat diminutive. Left V1 and V2 segments patent proximally. There is poor opacification of the left V2 segment at the level of V3 (series 7, image 193), which may be occluded. Contrast opacification is seen distally at the left V3 segment, with the left vert appearing patent as it courses into the cranial vault. Skeleton: No acute osseus abnormality. No discrete osseous lesions. Moderate cervical spondylolysis at C5-6 and C6-7. Other neck: No acute soft tissue abnormality within the neck. Upper chest: Left-sided pacemaker/AICD in place. Sequelae of prior CABG. Cardiomegaly partially visualized. Patchy multifocal consolidative opacities within the visualized lungs, concerning for pneumonia. Underlying pulmonary interstitial edema. Enlarged subcarinal nodal conglomerate measures 2.2 cm, which may be reactive. Review of the MIP images confirms the above findings CTA HEAD FINDINGS Anterior circulation: Petrous segments patent bilaterally. Extensive calcified plaque throughout the carotid siphons with moderate diffuse narrowing, up to approximately 50%. ICA termini patent. A1 segments patent bilaterally. Left A1 hypoplastic, likely accounting for the diminutive left ICA is compared to the right. Normal anterior communicating artery. Anterior cerebral arteries patent to their distal aspects without stenosis. Left M1 patent without stenosis. Normal left MCA bifurcation. No proximal left M2 occlusion. Distal left MCA branches well perfused. Right M1 widely patent. Normal right MCA bifurcation. No proximal right M2 occlusion. Distal  right MCA branches well perfused. Posterior circulation: Scattered non stenotic plaque within the right V4 segment. Right vertebral patent to the vertebrobasilar junction without stenosis. Focal approximate 60% stenosis within the left V4 segment as it penetrates the dural reflection. Moderate multifocal stenoses seen distally within the left V4 segment. Posterior inferior cerebral arteries patent bilaterally. Basilar patent proximally. There is occlusion distally (series 7, image 112), which remains occluded to the  basilar tip. Superior cerebellar arteries not seen. There is perfusion of the PCAs bilateral, likely via small posterior communicating arteries. Severe proximal right P2 stenosis (series 10, image 21). Multifocal atheromatous irregularity seen throughout the PCAs distally. Venous sinuses: Patent. Anatomic variants: Small bilateral posterior communicating arteries. Dominant right vertebral artery. Delayed phase: Not performed. Review of the MIP images confirms the above findings CT Brain Perfusion Findings: CBF (<30%) Volume: 0mL Perfusion (Tmax>6.0s) volume: Mismatch Volume: Infinite. Infarction Location:Negative infarct by CT perfusion. 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 IMPRESSION: 1. 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 Superior cerebellar arteries not visualized. PCAs are perfused at this time via collateralization from small posterior communicating arteries. 2. No acute core infarct identified by CT perfusion. 3. Approximate 50% stenosis at the origin of the right vertebral artery. Right vertebral artery dominant and otherwise widely patent to the vertebrobasilar junction. 4. Poor opacification of the distal left V2 segment, which may be occluded. Reconstitution at the V3 segment, which remains patent to the cranial vault. Multifocal moderate V4 stenoses as above. 5.  Atheromatous stenoses of up to approximately 70-80% about the carotid bifurcations bilaterally. Evaluation limited by prominent calcifications and motion artifact on this exam. 6. 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 7. 60% stenosis at the origin of the left subclavian artery. 8. Patchy multifocal consolidative opacities within the visualized lungs, concerning for pneumonia. Evidence for underlying pulmonary interstitial edema. Critical Value/emergent results were called by telephone at the time of interpretation on 01/09/2018 at 5:26 am to Dr. Jaci Carrel , who verbally acknowledged these results. Electronically Signed   By: Rise Mu M.D.   On: 12/20/2017 05:47   Ct Angio Neck W And/or Wo Contrast  Result Date: 12/21/2017 CLINICAL DATA:  Initial evaluation for right-sided neglect, slurred speech. EXAM: CT ANGIOGRAPHY HEAD AND NECK CT PERFUSION BRAIN TECHNIQUE: Multidetector CT imaging of the head and neck was performed using the standard protocol during bolus administration of intravenous contrast. Multiplanar CT image reconstructions and MIPs were obtained to evaluate the vascular anatomy. Carotid stenosis measurements (when applicable) are obtained utilizing NASCET criteria, using the distal internal carotid diameter as the denominator. Multiphase CT imaging of the brain was performed following IV bolus contrast injection. Subsequent parametric perfusion maps were calculated using RAPID software. CONTRAST:  ISOVUE-370 IOPAMIDOL (ISOVUE-370) INJECTION 76% COMPARISON:  None available at time of this dictation. FINDINGS: CTA NECK FINDINGS Aortic arch: Examination technically limited by motion artifact. Visualized aortic arch of normal caliber with normal branch pattern. Moderate atherosclerotic change about the aortic arch and origin of the great vessels. Approximate 60% stenosis at the origin of the left subclavian artery. No other  flow-limiting stenosis about the origin of the great vessels. Visualized subclavian arteries otherwise grossly patent. Right carotid system: Focal plaque at the origin of the right common carotid artery with associated 60% stenosis. Right common carotid artery otherwise patent to the bifurcation without flow-limiting stenosis. Extensive plaque about the right bifurcation/proximal right ICA. Resultant fairly severe stenosis of up to approximately 70-80%, although exact termination difficult given prominent calcifications and motion artifact within this region. Right ICA patent distally to the skull base. Left carotid system: Left common carotid artery patent from its origin to the bifurcation without stenosis. Prominent calcification about the left bifurcation/proximal left ICA with associated stenosis of up to approximately 75% by NASCET criteria, although again, evaluation limited. Left ICA patent distally to the  skull base without stenosis, dissection or occlusion. Vertebral arteries: Both of the vertebral arteries arise from the subclavian arteries. Right vertebral artery dominant. Focal plaque at the origin of the right vertebral artery with suspected approximate 50% stenosis. Right vertebral artery otherwise patent to the skull base. Left vertebral artery somewhat diminutive. Left V1 and V2 segments patent proximally. There is poor opacification of the left V2 segment at the level of V3 (series 7, image 193), which may be occluded. Contrast opacification is seen distally at the left V3 segment, with the left vert appearing patent as it courses into the cranial vault. Skeleton: No acute osseus abnormality. No discrete osseous lesions. Moderate cervical spondylolysis at C5-6 and C6-7. Other neck: No acute soft tissue abnormality within the neck. Upper chest: Left-sided pacemaker/AICD in place. Sequelae of prior CABG. Cardiomegaly partially visualized. Patchy multifocal consolidative opacities within the  visualized lungs, concerning for pneumonia. Underlying pulmonary interstitial edema. Enlarged subcarinal nodal conglomerate measures 2.2 cm, which may be reactive. Review of the MIP images confirms the above findings CTA HEAD FINDINGS Anterior circulation: Petrous segments patent bilaterally. Extensive calcified plaque throughout the carotid siphons with moderate diffuse narrowing, up to approximately 50%. ICA termini patent. A1 segments patent bilaterally. Left A1 hypoplastic, likely accounting for the diminutive left ICA is compared to the right. Normal anterior communicating artery. Anterior cerebral arteries patent to their distal aspects without stenosis. Left M1 patent without stenosis. Normal left MCA bifurcation. No proximal left M2 occlusion. Distal left MCA branches well perfused. Right M1 widely patent. Normal right MCA bifurcation. No proximal right M2 occlusion. Distal right MCA branches well perfused. Posterior circulation: Scattered non stenotic plaque within the right V4 segment. Right vertebral patent to the vertebrobasilar junction without stenosis. Focal approximate 60% stenosis within the left V4 segment as it penetrates the dural reflection. Moderate multifocal stenoses seen distally within the left V4 segment. Posterior inferior cerebral arteries patent bilaterally. Basilar patent proximally. There is occlusion distally (series 7, image 112), which remains occluded to the basilar tip. Superior cerebellar arteries not seen. There is perfusion of the PCAs bilateral, likely via small posterior communicating arteries. Severe proximal right P2 stenosis (series 10, image 21). Multifocal atheromatous irregularity seen throughout the PCAs distally. Venous sinuses: Patent. Anatomic variants: Small bilateral posterior communicating arteries. Dominant right vertebral artery. Delayed phase: Not performed. Review of the MIP images confirms the above findings CT Brain Perfusion Findings: CBF (<30%) Volume:  0mL Perfusion (Tmax>6.0s) volume: Mismatch Volume: Infinite. Infarction Location:Negative infarct by CT perfusion. 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 IMPRESSION: 1. 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 Superior cerebellar arteries not visualized. PCAs are perfused at this time via collateralization from small posterior communicating arteries. 2. No acute core infarct identified by CT perfusion. 3. Approximate 50% stenosis at the origin of the right vertebral artery. Right vertebral artery dominant and otherwise widely patent to the vertebrobasilar junction. 4. Poor opacification of the distal left V2 segment, which may be occluded. Reconstitution at the V3 segment, which remains patent to the cranial vault. Multifocal moderate V4 stenoses as above. 5. Atheromatous stenoses of up to approximately 70-80% about the carotid bifurcations bilaterally. Evaluation limited by prominent calcifications and motion artifact on this exam. 6. 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 7. 60% stenosis at the origin of the left subclavian artery. 8. Patchy multifocal consolidative opacities within the visualized lungs, concerning for pneumonia. Evidence for underlying  pulmonary interstitial edema. Critical Value/emergent results were called by telephone at the time of interpretation on 01/02/18 at 5:26 am to Dr. Jaci Carrel , who verbally acknowledged these results. Electronically Signed   By: Rise Mu M.D.   On: 01/02/2018 05:47   Ct Cerebral Perfusion W Contrast  Result Date: 01-02-2018 CLINICAL DATA:  Initial evaluation for right-sided neglect, slurred speech. EXAM: CT ANGIOGRAPHY HEAD AND NECK CT PERFUSION BRAIN TECHNIQUE: Multidetector CT imaging of the head and neck was performed using the standard protocol during bolus  administration of intravenous contrast. Multiplanar CT image reconstructions and MIPs were obtained to evaluate the vascular anatomy. Carotid stenosis measurements (when applicable) are obtained utilizing NASCET criteria, using the distal internal carotid diameter as the denominator. Multiphase CT imaging of the brain was performed following IV bolus contrast injection. Subsequent parametric perfusion maps were calculated using RAPID software. CONTRAST:  ISOVUE-370 IOPAMIDOL (ISOVUE-370) INJECTION 76% COMPARISON:  None available at time of this dictation. FINDINGS: CTA NECK FINDINGS Aortic arch: Examination technically limited by motion artifact. Visualized aortic arch of normal caliber with normal branch pattern. Moderate atherosclerotic change about the aortic arch and origin of the great vessels. Approximate 60% stenosis at the origin of the left subclavian artery. No other flow-limiting stenosis about the origin of the great vessels. Visualized subclavian arteries otherwise grossly patent. Right carotid system: Focal plaque at the origin of the right common carotid artery with associated 60% stenosis. Right common carotid artery otherwise patent to the bifurcation without flow-limiting stenosis. Extensive plaque about the right bifurcation/proximal right ICA. Resultant fairly severe stenosis of up to approximately 70-80%, although exact termination difficult given prominent calcifications and motion artifact within this region. Right ICA patent distally to the skull base. Left carotid system: Left common carotid artery patent from its origin to the bifurcation without stenosis. Prominent calcification about the left bifurcation/proximal left ICA with associated stenosis of up to approximately 75% by NASCET criteria, although again, evaluation limited. Left ICA patent distally to the skull base without stenosis, dissection or occlusion. Vertebral arteries: Both of the vertebral arteries arise from the  subclavian arteries. Right vertebral artery dominant. Focal plaque at the origin of the right vertebral artery with suspected approximate 50% stenosis. Right vertebral artery otherwise patent to the skull base. Left vertebral artery somewhat diminutive. Left V1 and V2 segments patent proximally. There is poor opacification of the left V2 segment at the level of V3 (series 7, image 193), which may be occluded. Contrast opacification is seen distally at the left V3 segment, with the left vert appearing patent as it courses into the cranial vault. Skeleton: No acute osseus abnormality. No discrete osseous lesions. Moderate cervical spondylolysis at C5-6 and C6-7. Other neck: No acute soft tissue abnormality within the neck. Upper chest: Left-sided pacemaker/AICD in place. Sequelae of prior CABG. Cardiomegaly partially visualized. Patchy multifocal consolidative opacities within the visualized lungs, concerning for pneumonia. Underlying pulmonary interstitial edema. Enlarged subcarinal nodal conglomerate measures 2.2 cm, which may be reactive. Review of the MIP images confirms the above findings CTA HEAD FINDINGS Anterior circulation: Petrous segments patent bilaterally. Extensive calcified plaque throughout the carotid siphons with moderate diffuse narrowing, up to approximately 50%. ICA termini patent. A1 segments patent bilaterally. Left A1 hypoplastic, likely accounting for the diminutive left ICA is compared to the right. Normal anterior communicating artery. Anterior cerebral arteries patent to their distal aspects without stenosis. Left M1 patent without stenosis. Normal left MCA bifurcation. No proximal left M2 occlusion. Distal left MCA branches  well perfused. Right M1 widely patent. Normal right MCA bifurcation. No proximal right M2 occlusion. Distal right MCA branches well perfused. Posterior circulation: Scattered non stenotic plaque within the right V4 segment. Right vertebral patent to the vertebrobasilar  junction without stenosis. Focal approximate 60% stenosis within the left V4 segment as it penetrates the dural reflection. Moderate multifocal stenoses seen distally within the left V4 segment. Posterior inferior cerebral arteries patent bilaterally. Basilar patent proximally. There is occlusion distally (series 7, image 112), which remains occluded to the basilar tip. Superior cerebellar arteries not seen. There is perfusion of the PCAs bilateral, likely via small posterior communicating arteries. Severe proximal right P2 stenosis (series 10, image 21). Multifocal atheromatous irregularity seen throughout the PCAs distally. Venous sinuses: Patent. Anatomic variants: Small bilateral posterior communicating arteries. Dominant right vertebral artery. Delayed phase: Not performed. Review of the MIP images confirms the above findings CT Brain Perfusion Findings: CBF (<30%) Volume: 0mL Perfusion (Tmax>6.0s) volume: Mismatch Volume: Infinite. Infarction Location:Negative infarct by CT perfusion. 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 IMPRESSION: 1. 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 Superior cerebellar arteries not visualized. PCAs are perfused at this time via collateralization from small posterior communicating arteries. 2. No acute core infarct identified by CT perfusion. 3. Approximate 50% stenosis at the origin of the right vertebral artery. Right vertebral artery dominant and otherwise widely patent to the vertebrobasilar junction. 4. Poor opacification of the distal left V2 segment, which may be occluded. Reconstitution at the V3 segment, which remains patent to the cranial vault. Multifocal moderate V4 stenoses as above. 5. Atheromatous stenoses of up to approximately 70-80% about the carotid bifurcations bilaterally. Evaluation limited by prominent calcifications and motion artifact  on this exam. 6. 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 7. 60% stenosis at the origin of the left subclavian artery. 8. Patchy multifocal consolidative opacities within the visualized lungs, concerning for pneumonia. Evidence for underlying pulmonary interstitial edema. Critical Value/emergent results were called by telephone at the time of interpretation on 12/15/2017 at 5:26 am to Dr. Jaci Carrel , who verbally acknowledged these results. Electronically Signed   By: Rise Mu M.D.   On: 12/13/2017 05:47    Procedures .Critical Care Performed by: Gilda Crease, MD Authorized by: Gilda Crease, MD   Critical care provider statement:    Critical care time (minutes):  40   Critical care time was exclusive of:  Separately billable procedures and treating other patients   Critical care was necessary to treat or prevent imminent or life-threatening deterioration of the following conditions:  CNS failure or compromise   Critical care was time spent personally by me on the following activities:  Ordering and performing treatments and interventions, ordering and review of laboratory studies, development of treatment plan with patient or surrogate, discussions with consultants, ordering and review of radiographic studies, pulse oximetry, re-evaluation of patient's condition, examination of patient and review of old charts   (including critical care time)  Medications Ordered in ED Medications  iopamidol (ISOVUE-370) 76 % injection 100 mL (100 mLs Intravenous Contrast Given 12/14/2017 0504)     Initial Impression / Assessment and Plan / ED Course  I have reviewed the triage vital signs and the nursing notes.  Pertinent labs & imaging results that were available during my care of the patient were reviewed by me and considered in my medical decision making (see chart for details).  Patient brought to the emergency  department by EMS as a code stroke.  Last known normal was unclear at arrival.  Patient is on Eliquis, not a candidate for TPA.  Last Eliquis dose was yesterday at bedtime.  Examination arrival revealed dense right hemiparesis with leftward gaze/right side neglect.  She has an expressive a aphasia.  Large vessel occlusion was felt likely.  She arrived as a code stroke.  She was evaluated by tele-neurology.  Screening CT was read as unremarkable, patient sent back for CT angios head and neck as well as CT perfusion study.  Basilar artery clot was noted.  Discussed with Dr. Amada Jupiter, at [facility].  Arrangements will be made for interventional radiology.  Patient will be transferred to [facility] emergency traffic by local EMS.  Final Clinical Impressions(s) / ED Diagnoses   Final diagnoses:  Stroke [facility])    ED Discharge Orders    None       Gilda Crease, MD 02-Jan-2018 [phone]

## 2017-12-31 NOTE — Consult Note (Signed)
   [facility] TeleNeurology Consult Services    Date of Service:   Nov 12, 2017 04:39:51  Impression:     .  RO Acute Ischemic Stroke  Comments: Emergent transfer for thrombectomy. ED doc discussed with neuro and NIR.  Mechanism of Stroke: Possible Thromboembolic Possible Cardioembolic Small Vessel Disease  Metrics: Last Known Well: Unknown [facility] Notification Time: Nov 12, 2017 04:39:14 Arrival Time: Nov 12, 2017 04:20:00 Stamp Time: Nov 12, 2017 04:39:51 Time First Login Attempt: Nov 12, 2017 04:45:00 Video Start Time: Nov 12, 2017 04:45:00  Symptoms: right weakness and aphasia NIHSS Start Assessment Time: Nov 12, 2017 04:45:00 Patient is not a candidate for tPA. Patient was not deemed candidate for tPA thrombolytics because of Current use of NOA's. Video End Time: Nov 12, 2017 04:57:46  CT head was reviewed.  Advanced imaging was reviewed. Advanced imaging CTA head and neck obtained.  This is deemed to be a candidate for thrombectomy. Case not discussed with Neurointerventionalist. ER physician notified of the decision on thrombolytics management.  Our recommendations are outlined below.  Recommendations:   Routine Consultation with Inhouse Neurology for Follow up Care  Sign Out:     .  Discussed with Emergency Department Provider    ------------------------------------------------------------------------------  History of Present Illness: Patient is a 72 years old Female.  Patient was brought by EMS for symptoms of right weakness and aphasia  Patient presented from home, son is not here yet, she is aphasic so from report she may be last seen normal at 1 am but this is not confirmed, has Afib on Eliquis, she was found with right weakness and aphasia.  CT head was reviewed.   Examination: 1A: Level of Consciousness - Alert; keenly responsive + 0 1B: Ask Month and Age - Aphasic + 2 1C: Blink Eyes & Squeeze Hands - Performs Both Tasks + 0 2: Test  Horizontal Extraocular Movements - Normal + 0 3: Test Visual Fields - No Visual Loss + 0 4: Test Facial Palsy (Use Grimace if Obtunded) - Normal symmetry + 0 5A: Test Left Arm Motor Drift - No Drift for 10 Seconds + 0 5B: Test Right Arm Motor Drift - No Movement + 4 6A: Test Left Leg Motor Drift - No Drift for 5 Seconds + 0 6B: Test Right Leg Motor Drift - No Effort Against Gravity + 3 7: Test Limb Ataxia (FNF/Heel-Shin) - Does Not Understand + 0 8: Test Sensation - Normal; No sensory loss + 0 9: Test Language/Aphasia - Mute/Global Aphasia: No Usable Speech/Auditory Comprehension + 3 10: Test Dysarthria - Mute/Anarthric + 2 11: Test Extinction/Inattention - No abnormality + 0  NIHSS Score: 14  Patient was informed the Neurology Consult would happen via TeleHealth consult by way of interactive audio and video telecommunications and consented to receiving care in this manner.  Due to the immediate potential for life-threatening deterioration due to underlying acute neurologic illness, I spent 35 minutes providing critical care. This time includes time for face to face visit via telemedicine, review of medical records, imaging studies and discussion of findings with providers, the patient and/or family.   Dr Lorrin GoodellYazan Jaquell Seddon   [facility] [phone]

## 2017-12-31 NOTE — ED Notes (Signed)
EMS arrived to transfer pt.  

## 2017-12-31 NOTE — Progress Notes (Signed)
Assessed patient with Dr. Corliss Skainseveshwar in neuro ICU following procedure. Patient underwent an image-guided cerebral angiogram with mid to distal basilar artery thrombectomy today with Dr. Corliss Skainseveshwar.  Patient laying in bed intubated and sedated. Speech and comprehension not assessed. Pupils reactive but sluggish, 2-3 mm bilaterally. EOMs not assessed. Visual fields not assessed. No facial asymmetry. Unable to protrude tongue. Patient able to wiggle toes and withdraw bilateral lower extremities to pain, spontaneously moving right arm, no spontaneous movements of left arm. Pronator drift not assessed. Fine motor and coordination not assessed. Gait not assessed. Romberg not assessed. Heel to toe not assessed. Radial pulses 2+ bilaterally.  Plan to stay in neuro ICU. Appreciate and agree with neurology management. IR to follow.  [name] Bogalexandra M Louk, PA-C 06-22-2017, 2:40 PM

## 2017-12-31 NOTE — Progress Notes (Addendum)
   09/19/17 1000  Vitals  Temp (!) 97 F (36.1 C)  Temp Source Rectal  BP (!) 68/46 (MD made aware)  MAP (mmHg) (!) 54  BP Location Left Arm  Pulse Rate 73  ECG Heart Rate 73  Resp 17  Oxygen Therapy  SpO2 100 %  O2 Device Ventilator  Art Line  Arterial Line BP 85/46  Arterial Line MAP (mmHg) 60 mmHg  Pain Assessment  Pain Scale CPOT  Critical Care Pain Observation Tool (CPOT)  Facial Expression 0  Body Movements 0  Muscle Tension 0  Compliance with ventilator (intubated pts.) 0  Vocalization (extubated pts.) N/A  CPOT Total 0  PCA/Epidural/Spinal Assessment  Respiratory Pattern Regular;Unlabored  Glasgow Coma Scale  Unable to Complete GCS due to  Medication  Height and Weight  Height 5\' 1"  (1.549 m)  Weight in (lb) to have BMI = 25 132  Provider Notification  Provider Name/Title NP from Neurology at bedside  Date Provider Notified 09/19/17  Time Provider Notified 1000  Pt arrived to 4N15 at current time.  Pt unresponsive,  TR band in place to R radial artery, palpable pulse distal to TR band.  L femoral site accessed but unable to use for angio, gazue to that site.  R pupil fixed and dilated, left 4-5 mm and reactive (this is new s/p procedure per IR CRNA) Minimally responsive to pain to LEs and LUE, no movement to pain in RUE. MD at bedside and made aware of all aforementioned information.

## 2017-12-31 NOTE — Progress Notes (Signed)
Code stroke  Call time  423am Beeper   425  Exam started  430 Finished  435 SOC   435 Complete in epic 440 Rad called  NO ANSWER.

## 2017-12-31 NOTE — ED Triage Notes (Signed)
Pt arrives via EMS Code Stroke. Pt was last known well at 0100. Pt has right sided neglect and weakness. Pt also with slurred speech and unable to control saliva.

## 2017-12-31 NOTE — Consult Note (Addendum)
NAME:  [name] [name], MRN:  [id number], DOB:  11/18/1945, LOS: 0 ADMISSION DATE:  01/06/2018, CONSULTATION DATE:  9/20 REFERRING MD: Pearlean Brownie , CHIEF COMPLAINT:     Brief History   72 y.o. female with a history of PVD and CHF as well as afib on eliquis who was last seen by family around 7:30 PM.  She arrived to the ED 9/20 am  and she was severely  Dysarthric with right sided weakness, but  able to understand and nod/shake her head.  She was  asked  if she was normal when she awoke at 1 AM. She indicates that she was normal at 1 am.  She indicates that she  developed symptoms after that. Exact time of onset after 1 am  is uncertain.  She has a history of CHF and peripheral vascular disease . CT Head was + for Cerebral infarction due to embolism of right middle cerebral artery. She was out of window for tpa, but within time frame for acute IR intervention. Of interest coags were normal, so Eliquis compliance is questionable. Pt. Was intubated for revascularization. PCCM have been consulted for vent management and assistance with ICU care. Significant Hospital Events   9/20>> Admission 9/20>> CT Brain: Positive CTA for EVLO with occlusive thrombus within the distal basilar artery.  Consults: date of consult/date signed off & final recs:  Pulmonary 9/20 IR 9/20 PT/OT/ Speech 9/20 Stroke Team 9/20  Procedures (surgical and bedside):  9/20  SP RT Vertbral artery angiogram followed complete revascularization og occluded mid to distal basilar artery  Significant Diagnostic Tests:  12/27/2017>>  CT head 9/20>> CT Brain: Positive CTA for EVLO with occlusive thrombus within the distal basilar artery. 01/09/2018 Echo>> pending 06/2017 Echo Left ventricle: LVEF is approximately 40% with akiensis of the   distal inferior/inferoseptal and apical walls; hypokinesis of the   distal anterior and lateral walls. The cavity size was moderately   dilated. Wall thickness was increased in a pattern of mild  LVH. - Mitral valve: There was mild regurgitation. - Left atrium: The atrium was severely dilated. - Right atrium: The atrium was mildly dilated. - Pulmonary arteries: PA peak pressure: 40 mm Hg (S).  Micro Data: 12/07/2017>> Recent toe infection>>STAPHYLOCOCCUS AUREUS    Antimicrobials:  9/20>> None   Subjective:  SP re-perfusion procedure, intubated and sedated, on propofol and Cleviprex Needs CXR for ETT placement and ABG  Objective   Blood pressure (!) 68/46, pulse 73, temperature (!) 97 F (36.1 C), temperature source Rectal, resp. rate 17, height 5\' 1"  (1.549 m), SpO2 100 %.    Vent Mode: PRVC FiO2 (%):  [100 %] 100 % Set Rate:  [15 bmp] 15 bmp Vt Set:  [450 mL] 450 mL PEEP:  [5 cmH20] 5 cmH20 Plateau Pressure:  [22 cmH20] 22 cmH20   Intake/Output Summary (Last 24 hours) at 12/14/2017 1054 Last data filed at 01/02/2018 0952 Gross per 24 hour  Intake 1500 ml  Output 475 ml  Net 1025 ml   There were no vitals filed for this visit.  Examination: General: Sedated and intubated SP re-perfusion procedure HENT: NCAT, thick neck, No LAD, No JVD Lungs: Bilateral chest excursion noted, Coarse throughout,synchronous with vent Cardiovascular: SR with PAC's and PVC's, S1, S2, No RMG Abdomen: Obese, Minimal BS with OG clamped, NT, ND, Body mass index is 43.84 kg/m.  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 Placement of rescue stent across RT PCA ,Bilateral pcoms. Sedated and intubated R pupil Fixed and dilated Suspect some brain stem infarct per neuro Plan: Continue Cleviprex per neuro for BP control Coreg and Vasotec as ordered Brilinta BID Frequent neuro checks Monitor for seizures Follow up imaging per neuro Rest per Neuro Primary  Acute Respiratory Insufficiency in setting of Cerebral infarction due to embolism of right middle cerebral artery and IR re-vascularization Placed on PC upon arrival to ICU Former smoker quit 1993 Difficult intubation 9/20 Plan: CXR now and in am ABG upon return from CT>> call results No weaning today 9/20 Wean oxygen and titrate PEEP as needed Saturation goals are 88-92% Albuterol nebs prn Monitor bloody secretions per glottic suction Propofol for sedation RASS Goal -1   Hx. Atrial Fibrillation on Eliquis  Hx. CHF, Pulmonary HTN + 1 L since admission Hx PVD Plan: Follow Up Echo>> done not read Tele monitoring EKG prn Trend troponin Monitor I&O daily Consider adding lasix 20 daily ( Home dose is 40 BID) CXR daily Trend lactate as needed   Renal: No Acute Issues Plan: Trend BMET and UO daily Replete electrolytes prn Monitor I&O closely  Hematology Eliquis at home Leukocytosis Plan PAS hose for DVT prophylaxis 9/20 Starting 01/01/2018>> Heparin SQ CBC daily PT/INR in am Monitor for bleeding Transfuse for HGB < 7  ID Leukocytosis 12/07/2017>> Recent toe infection + for STAPHYLOCOCCUS AUREUS   Plan: Trend fever curve and WBC CBC daily Culture as is clinically indicated Trend PCT PCT to guide ABX therapy  Endocrine + DM + hypothyroid Plan: Synthroid as ordered CBG Q 4 SSI as ordered HGB A1C TSH   GI No Acute Issues Plan: NPO for now SUP pepcid Will consider TF 9/21  Neuro>> As above   No family at bedside to update 9/20 Will most  likely need trach and PEG as per Dr. Pearlean Brownie prognosis is poor   Disposition / Summary of Today's Plan Jan 03, 2018   SP revascularization 9/20 ABG/ CXR No weaning     Diet: NPO Pain/Anxiety/Delirium protocol : Propofol VAP protocol  DVT prophylaxis: SQ Heparin starting 9/21, PAS hose 9/20  GI prophylaxis: Pepcid Hyperglycemia protocol: CBG/ SSI Mobility:BR Code Status: FC Family Communication: No Family at bedside  Labs   CBC: Recent Labs  Lab 01/03/18 0450 Jan 03, 2018 0453  WBC 11.6*  --   NEUTROABS 7.8*  --   HGB 14.2 14.6  HCT 44.0 43.0  MCV 95.0  --   PLT 378  --    Basic Metabolic Panel: Recent Labs  Lab 12/30/17 1004 Jan 03, 2018 0450 03-Jan-2018 0453  NA 139 137 138  K 4.8 4.7 4.7  CL 104 103 105  CO2 28 26  --   GLUCOSE 170* 286* 281*  BUN 17 20 22   CREATININE 0.88 0.87 0.80  CALCIUM 9.6 9.5  --    GFR: CrCl cannot be calculated (Unknown ideal weight.). Recent Labs  Lab 01/03/2018 0450  WBC  11.6*   Liver Function Tests: Recent Labs  Lab 12/30/17 1004 12/12/2017 0450  AST 18 20  ALT 14 17  ALKPHOS  --  50  BILITOT 0.8 0.8  PROT 7.7 8.7*  ALBUMIN  --  4.3   No results for input(s): LIPASE, AMYLASE in the last 168 hours. No results for input(s): AMMONIA in the last 168 hours. ABG    Component Value Date/Time   TCO2 24 01/04/2018 0453    Coagulation Profile: Recent Labs  Lab 01/05/2018 0450  INR 1.12   Cardiac Enzymes: No results for input(s): CKTOTAL, CKMB, CKMBINDEX, TROPONINI in the last 168 hours. HbA1C: Hgb A1c MFr Bld  Date/Time Value Ref Range Status  12/30/2017 10:04 AM 8.4 (H) <5.7 % of total Hgb Final    Comment:    For someone without known diabetes, a hemoglobin A1c value of 6.5% or greater indicates that they may have  diabetes and this should be confirmed with a follow-up  test. . For someone with known diabetes, a value <7% indicates  that their diabetes is well controlled and a value  greater than or equal to 7% indicates  suboptimal  control. A1c targets should be individualized based on  duration of diabetes, age, comorbid conditions, and  other considerations. . Currently, no consensus exists regarding use of hemoglobin A1c for diagnosis of diabetes for children. Marland Kitchen.   08/09/2017 09:03 AM 9.2 (H) <5.7 % of total Hgb Final    Comment:    For someone without known diabetes, a hemoglobin A1c value of 6.5% or greater indicates that they may have  diabetes and this should be confirmed with a follow-up  test. . For someone with known diabetes, a value <7% indicates  that their diabetes is well controlled and a value  greater than or equal to 7% indicates suboptimal  control. A1c targets should be individualized based on  duration of diabetes, age, comorbid conditions, and  other considerations. . Currently, no consensus exists regarding use of hemoglobin A1c for diagnosis of diabetes for children. .    CBG: Recent Labs  Lab 01/01/2018 0448  GLUCAP 277*    Admitting History of Present Illness.   72 y.o. female with a history of PVD and CHF as well as afib on eliquis who was last seen by family around 7:30 PM.  She arrived to the ED 9/20 am  and she was severely  Dysarthric with right sided weakness, but  able to understand and nod/shake her head.  She was  asked  if she was normal when she awoke at 1 AM. She indicates that she was normal at 1 am.  She indicates that she  developed symptoms after that. Exact time of onset  is uncertain , but it was after 1 am.  She has a history of CHF and peripheral vascular disease . CT Head was + for Cerebral infarction due to embolism of right middle cerebral artery. She was out of window for tpa, but within time frame for acute IR intervention. Of interest coags were normal, so Eliquis compliance is questionable. Pt. Was intubated for revascularization. PCCM have been consulted for vent management and assistance with ICU care. Review of Systems:   Unable : intubated  and sedated Past medical history  She,  has a past medical history of Atrial fibrillation (HCC), Chronic systolic heart failure (HCC), Colon polyps, Coronary atherosclerosis of native coronary artery, Diabetes mellitus, type 2 (HCC), Essential hypertension, benign, Hyperlipidemia, Hypothyroidism, Hypothyroidism, Ischemic cardiomyopathy, Myocardial infarction (  HCC), PVD (peripheral vascular disease) (HCC), Status cardiac pacemaker, and Ulcer.   Surgical History    Past Surgical History:  Procedure Laterality Date  . ABDOMINAL AORTAGRAM N/A 08/04/2013   Procedure: ABDOMINAL Ronny Flurry;  Surgeon: Sherren Kerns, MD;  Location: [facility] CATH LAB;  Service: Cardiovascular;  Laterality: N/A;  . APPENDECTOMY    . BI-VENTRICULAR PACEMAKER INSERTION N/A 11/20/2013   Procedure: BI-VENTRICULAR PACEMAKER INSERTION (CRT-P);  Surgeon: Marinus Maw, MD;  Location[facility] CATH LAB;  Service: Cardiovascular;  Laterality: N/A;  . Bilateral tubal ligation    . CARDIAC DEFIBRILLATOR PLACEMENT     [employer]  . CESAREAN SECTION    . CORONARY ARTERY BYPASS GRAFT  2001   [facility] - [place] to LAD, SVG to ramus, SVG to OM/PLB, SVG to RCA   . FLEXIBLE SIGMOIDOSCOPY  07/19/1998   Dr. Karilyn Cota  . TONSILLECTOMY       Social History   Social History   Socioeconomic History  . Marital status: Divorced    Spouse name: Not on file  . Number of children: 1  . Years of education: Not on file  . Highest education level: Not on file  Occupational History  . Occupation: Full Time: [employer]  . Occupation: Disabled    Associate Professor: NOT EMPLOYED  Social Needs  . Financial resource strain: Not on file  . Food insecurity:    Worry: Not on file    Inability: Not on file  . Transportation needs:    Medical: Not on file    Non-medical: Not on file  Tobacco Use  . Smoking status: Former Smoker    Last attempt to quit: 09/16/1991    Years since quitting: 26.3  . Smokeless tobacco: Never Used  Substance and  Sexual Activity  . Alcohol use: No    Alcohol/week: 0.0 standard drinks  . Drug use: No  . Sexual activity: Never    Birth control/protection: Post-menopausal  Lifestyle  . Physical activity:    Days per week: Not on file    Minutes per session: Not on file  . Stress: Not on file  Relationships  . Social connections:    Talks on phone: Not on file    Gets together: Not on file    Attends religious service: Not on file    Active member of club or organization: Not on file    Attends meetings of clubs or organizations: Not on file    Relationship status: Not on file  . Intimate partner violence:    Fear of current or ex partner: Not on file    Emotionally abused: Not on file    Physically abused: Not on file    Forced sexual activity: Not on file  Other Topics Concern  . Not on file  Social History Narrative  . Not on file  ,  reports that she quit smoking about 26 years ago. She has never used smokeless tobacco. She reports that she does not drink alcohol or use drugs.   Family history   Her family history includes AAA (abdominal aortic aneurysm) in her father; Coronary artery disease (age of onset: 56) in her sister; Diabetes in her father, mother, and sister; Heart attack in her father and sister; Heart disease in her father, mother, and sister; Hyperlipidemia in her father and mother; Hypertension in her father and mother; Ulcers in her mother.   Allergies Allergies  Allergen Reactions  . Rosiglitazone Other (See Comments)    CHF  .  Tapentadol Rash  . Tape     Left rash per patient  . Celecoxib Other (See Comments)    Congestive heart failure    Home meds  Prior to Admission medications   Medication Sig Start Date End Date Taking? Authorizing Provider  ALPRAZolam (XANAX) 1 MG tablet TAKE ONE-HALF TO ONE TABLET BY MOUTH TWOTIMES A DAY AS NEEDED FOR ANXIETY Patient taking differently: Take 0.5 mg by mouth 2 (two) times daily as needed. TAKE ONE-HALF TO ONE TABLET BY  MOUTH TWOTIMES A DAY AS NEEDED FOR ANXIETY 10/07/17  Yes Campbell Riches, NP  apixaban (ELIQUIS) 5 MG TABS tablet Take 1 tablet (5 mg total) by mouth 2 (two) times daily. 11/05/16  Yes Marinus Maw, MD  carvedilol (COREG) 25 MG tablet TAKE ONE TABLET TWICE DAILY WITH A MEAL Patient taking differently: Take 12.5 mg by mouth 2 (two) times daily with a meal. TAKE ONE TABLET TWICE DAILY WITH A MEAL 11/05/16  Yes Marinus Maw, MD  Cholecalciferol (VITAMIN D3) 5000 units CAPS Take 1 capsule (5,000 Units total) by mouth daily. 08/19/17  Yes Roma Kayser, MD  enalapril (VASOTEC) 10 MG tablet Take 1 tablet (10 mg total) by mouth 2 (two) times daily. Patient taking differently: Take 10 mg by mouth daily.  11/05/16  Yes Marinus Maw, MD  furosemide (LASIX) 40 MG tablet Take 1 tablet (40 mg total) by mouth 2 (two) times daily. 11/05/16  Yes Marinus Maw, MD  insulin lispro (HUMALOG KWIKPEN) 100 UNIT/ML KiwkPen Inject 0.24-0.3 mLs (24-30 Units total) into the skin 3 (three) times daily. 08/19/17  Yes Nida, Denman George, MD  LEVEMIR FLEXTOUCH 100 UNIT/ML Pen INJECT 80 UNITS INTO THE SKIN DAILY AT 10:00PM Patient taking differently: Inject 80 Units into the skin at bedtime.  08/11/17  Yes Nida, Denman George, MD  multivitamin [facility]) per tablet Take 1 tablet by mouth daily.     Yes [provider]  Omega-3 Fatty Acids (FISH OIL) 1000 MG CAPS Take 1,000 mg by mouth daily.   Yes [provider]  potassium chloride SA (K-DUR,KLOR-CON) 20 MEQ tablet Take 1 tablet (20 mEq total) by mouth 2 (two) times daily. 06/10/17  Yes Jonelle Sidle, MD  SYNTHROID 125 MCG tablet TAKE ONE (1) TABLET BY MOUTH EVERY DAY Patient taking differently: Take 125 mcg by mouth daily before breakfast.  10/07/17  Yes Nida, Denman George, MD  traMADol (ULTRAM) 50 MG tablet TAKE ONE TABLET ONCE DAILY FOR MODERATE PAIN Patient taking differently: Take 50 mg by mouth as needed for moderate pain. TAKE ONE  TABLET ONCE DAILY FOR MODERATE PAIN 06/11/17  Yes Merlyn Albert, MD  VICTOZA 18 MG/3ML SOPN INJECT 0.3ML'S (1.8MG  TOTAL) INTO THE SKIN ONCE DAILY Patient taking differently: Inject 1.8 mg into the skin daily.  07/12/17  Yes Roma Kayser, MD  vitamin E 600 UNIT capsule Take 600 Units by mouth daily.     Yes [provider]  ACCU-CHEK AVIVA PLUS test strip USE TO TEST GLUCOSE 4 TIMES A DAY 12/27/17   Roma Kayser, MD  Blood Glucose Monitoring Suppl (ACCU-CHEK AVIVA) device Use as instructed 05/06/15   Roma Kayser, MD  Continuous Blood Gluc Sensor (FREESTYLE LIBRE SENSOR SYSTEM) MISC Use one sensor every 10 days. 12/24/16   Roma Kayser, MD  glucose blood test strip Accu-Chek Aviva Plus test strips    [provider]  Insulin Pen Needle (B-D ULTRAFINE III SHORT PEN) 31G X  8 MM MISC Use 5 x daily as directed 09/02/16   Roma Kayser, MD  OVER THE COUNTER MEDICATION Take 1 tablet by mouth daily. Phyomega vitamin.    [provider]      Bevelyn Ngo, AGACNP-BC [facility] Pager # [phone] 2018-01-12 10:54 AM  Attending Note:  72 year old female with PMH above who presents to the hospital with AMS, found to have a large ischemic CVA despite of being on eliquis for a-fib.  Patient was taken in by [facility] and clot was removed.  However, on exam, neuro exam remains very poor with a blown right pupil.  I reviewed CXR myself, ETT is in a good position.  Discussed with neurology.  Prognosis is very poor for meaningful recovery.  Will adjust vent for ABG.  Maintain on full support for now and with sedation.  Keep flat for sheaths.  Maintain SBP between 120-140.  Hold off any weaning for now.  Neurology is speaking with the family.  ISS and CBGs ordered.  No need for abx at this time.  PCCM will continue to follow for critical care management.  The patient is critically ill with multiple organ systems failure  and requires high complexity decision making for assessment and support, frequent evaluation and titration of therapies, application of advanced monitoring technologies and extensive interpretation of multiple databases.   Critical Care Time devoted to patient care services described in this note is  45  Minutes. This time reflects time of care of this signee Dr Koren Bound. This critical care time does not reflect procedure time, or teaching time or supervisory time of PA/NP/Med student/Med Resident etc but could involve care discussion time.  Alyson Reedy, M.D. [facility] Pulmonary/Critical Care Medicine. Pager: [phone]. After hours pager: [phone].

## 2017-12-31 NOTE — Progress Notes (Signed)
Initial Nutrition Assessment  DOCUMENTATION CODES:   Morbid obesity  INTERVENTION:   Recommend Vital High Protein @ 40 ml/hr (960 ml/day) 30 ml Prostat BID MVI daily  Provides: 1160 kcal, 114 grams protein, and 802 ml free water.    NUTRITION DIAGNOSIS:   Inadequate oral intake related to inability to eat as evidenced by NPO status.  GOAL:   Provide needs based on ASPEN/SCCM guidelines  MONITOR:   TF tolerance, Vent status  REASON FOR ASSESSMENT:   Ventilator    ASSESSMENT:   Pt with PMH of DM on insulin at home, HTN, CHF, afib, ischemic cardiomyopathy s/p ICD placement, and PVD who was admitted with basilar occlusion likely secondary to embolus from afib. Pt s/p revascularization today.    Pt discussed during ICU rounds and with RN.  Pt just back from IR  Patient is currently intubated on ventilator support MV: 10.6 L/min Temp (24hrs), Avg:97 F (36.1 C), Min:97 F (36.1 C), Max:97 F (36.1 C)  Medications reviewed and include: SSI with meals Labs reviewed:  CBG: 222 Lab Results  Component Value Date   HGBA1C 8.4 (H) 12/30/2017  BP: 114/48 MAP: 63    NUTRITION - FOCUSED PHYSICAL EXAM:    Most Recent Value  Orbital Region  No depletion  Upper Arm Region  No depletion  Thoracic and Lumbar Region  No depletion  Buccal Region  No depletion  Temple Region  No depletion  Clavicle Bone Region  No depletion  Clavicle and Acromion Bone Region  No depletion  Scapular Bone Region  Unable to assess  Dorsal Hand  No depletion  Patellar Region  No depletion  Anterior Thigh Region  No depletion  Posterior Calf Region  No depletion  Edema (RD Assessment)  None  Hair  Reviewed  Eyes  Unable to assess  Mouth  Unable to assess  Skin  Reviewed  Nails  Reviewed       Diet Order:   Diet Order            Diet NPO time specified  Diet effective now              EDUCATION NEEDS:   No education needs have been identified at this time  Skin:  Skin  Assessment: Reviewed RN Assessment(groin incision)  Last BM:  unknown  Height:   Ht Readings from Last 1 Encounters:  March 14, 2018 5\' 1"  (1.549 m)    Weight:   Wt Readings from Last 1 Encounters:  March 14, 2018 112.3 kg    Ideal Body Weight:  47.7 kg  BMI:  Body mass index is 46.78 kg/m.  Estimated Nutritional Needs:   Kcal:  [id number]  Protein:  95-119 grams   Fluid:  > 1.5 L/day  Kendell BaneHeather Creedence Heiss RD, LDN, CNSC [phone] Pager [phone] After Hours Pager

## 2017-12-31 NOTE — Procedures (Signed)
S/P RT Vertbral artery angiogram followed complete revascularization og occluded mid to distal basilar artery with x 3 passes with 5mm x 33 mm embotrap retriever device achieving a TICI 2 B revascularization . Placement of rescue stent across RT PCA ,Bilateral pcoms,

## 2017-12-31 NOTE — Progress Notes (Signed)
Inpatient Diabetes Program Recommendations  AACE/ADA: New Consensus Statement on Inpatient Glycemic Control (2015)  Target Ranges:  Prepandial:   less than 140 mg/dL      Peak postprandial:   less than 180 mg/dL (1-2 hours)      Critically ill patients:  140 - 180 mg/dL   Lab Results  Component Value Date   GLUCAP 222 (H) 12/15/2017   HGBA1C 8.4 (H) 12/30/2017    Review of Glycemic Control  Diabetes history: Type 2 Outpatient Diabetes medications: Levemir 80 units every HS, Humalog 24-30 units TID, Victoza Current orders for Inpatient glycemic control: Novolog RESISTANT TID  Inpatient Diabetes Program Recommendations:    Noted that blood sugars have been greater than 180 mg/dl.  Recommend changing Novolog correction scale to every 4 hours if patient is NPO. If blood sugars continue to be elevated, may need Levemir 15 units daily to start and titrate dosage as needed.   Marland Kitchen.Smith MinceKendra Ariannie Penaloza RN BSN CDE Diabetes Coordinator Pager: [phone]  8am-5pm

## 2017-12-31 NOTE — Transfer of Care (Signed)
Immediate Anesthesia Transfer of Care Note  Patient: [name] [name]  Procedure(s) Performed: RADIOLOGY WITH ANESTHESIA (N/A )  Patient Location: ICU  Anesthesia Type:General  Level of Consciousness: unresponsive and Patient remains intubated per anesthesia plan  Airway & Oxygen Therapy: Patient remains intubated per anesthesia plan and Patient placed on Ventilator (see vital sign flow sheet for setting)  Post-op Assessment: Report given to RN and Post -op Vital signs reviewed and stable  Post vital signs: Reviewed and stable  Last Vitals:  Vitals Value Taken Time  BP    Temp    Pulse 70 03-19-18  9:52 AM  Resp 15 03-19-18  9:52 AM  SpO2 100 % 03-19-18  9:50 AM  Vitals shown include unvalidated device data.  Last Pain: There were no vitals filed for this visit.       Complications: No apparent anesthesia complications

## 2017-12-31 NOTE — Progress Notes (Signed)
OT Cancellation Note  Patient Details Name: [name] MRN: [id number] DOB: 1945/09/16   Cancelled Treatment:    Reason Eval/Treat Not Completed: Patient at procedure or test/ unavailable(IR)  Thornell MuleWARD,HILLARY  Forestine Macho, OT/L   Acute OT Clinical Specialist Acute Rehabilitation Services Pager [phone] Office [phone]  12/28/2017, 4:12 PM

## 2017-12-31 NOTE — Progress Notes (Signed)
Transported PT from 4N15 to CT[facility] Pt stable throughout with no complications. VS within normal limits.

## 2017-12-31 NOTE — Progress Notes (Signed)
Paged on call Neuro MD regarding low blood pressure.  bolus of NS ordered.

## 2017-12-31 NOTE — H&P (Signed)
Neurology H&P  CC: Left Sided weakness  History is obtained from:patient  HPI: [name] [name] is a 72 y.o. female with a history of PVD and CHF as well as afib on eliquis who was last seen by family around 7:30 PM.  She is severely dysarthric, but is able to understand and nod/shake her head and when I asked her if she was normal when she awoke at 1 AM she indicates that she was developed symptoms after that.  I am uncertain of the exact time of onset, however.  She has a history of CHF and peripheral vascular disease and due to this, she is not able to walk long distance, but she does not need any help with any of her activities of daily living.   LKW: 1 AM tpa given?: no, out of window Modified Rankin Scale: 2-Slight disability-UNABLE to perform all activities but does not need assistance NIHSS:13  ROS:   Unable to obtain due to severe dysarthria  Past Medical History:  Diagnosis Date  . Atrial fibrillation (HCC)   . Chronic systolic heart failure (HCC)   . Colon polyps    20 yrs ago  . Coronary atherosclerosis of native coronary artery    Multivessel, patent grafts 2007  . Diabetes mellitus, type 2 (HCC)   . Essential hypertension, benign   . Hyperlipidemia   . Hypothyroidism   . Hypothyroidism   . Ischemic cardiomyopathy    Ejection Fraction of 25% improved to 45-50% following biventicular pacing, Status post biventricular dual-chamber ICD - [employer] Livian H22  . Myocardial infarction (HCC)   . PVD (peripheral vascular disease) (HCC)   . Status cardiac pacemaker   . Ulcer      Family History  Problem Relation Age of Onset  . Diabetes Mother   . Ulcers Mother   . Heart disease Mother        before age 70  . Hyperlipidemia Mother   . Hypertension Mother   . Diabetes Father   . Heart attack Father   . Hyperlipidemia Father   . Hypertension Father   . Heart disease Father        before age 70  . AAA (abdominal aortic aneurysm) Father   . Diabetes  Sister   . Heart disease Sister        before age 70  . Heart attack Sister   . Coronary artery disease Sister [id number]     Social History:  reports that she quit smoking about 26 years ago. She has never used smokeless tobacco. She reports that she does not drink alcohol or use drugs.   Exam: Current vital signs: BP (!) 141/75   Pulse 70   Resp 16   LMP  (LMP Unknown)   SpO2 94%  Vital signs in last 24 hours: Pulse Rate:  [69-73] 70 (09/20 0534) Resp:  [16-30] 16 (09/20 0534) BP: (141-191)/(75-112) 141/75 (09/20 0534) SpO2:  [93 %-96 %] 94 % (09/20 0534)  Physical Exam  Constitutional: Appears well-developed and obese Psych: Appears anxious Eyes: No scleral injection HENT: No OP obstrucion Head: Normocephalic.  Cardiovascular: Normal rate and regular rhythm.  Respiratory: Effort normal and breath sounds normal to anterior ascultation GI: Soft.  No distension. There is no tenderness.  Skin: WDI  Neuro: Mental Status: She is awake, alert, able to follow commands readily, unable to speak due to severe dysarthria. Cranial Nerves: II: Visual Fields are full. Pupils are equal, round, and reactive to light.  III,IV, VI:  She has severe limitations of extraocular movements with minimal abduction in her left eye, no other movements are seen V, VII: She has symmetric nasolabial folds, but is unable to smile voluntarily on either side She has severe difficulty with oral movements and Motor: Able to move her left side against gravity, no drift, severe 1/5 weakness in the RUE, 2/5 in the RLE Sensory: As above.  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  She is within the timeframe for acute IR intervention and she has been taken for this.  With normal coags, I do wonder about compliance with Eliquis.  Recommendations: - HgbA1c, fasting lipid panel - MRI  of the brain without contrast - Frequent neuro checks - Echocardiogram - Prophylactic therapy-TBD pending post intervention imaging - Risk factor modification - Telemetry monitoring - PT consult, OT consult, Speech consult - Stroke team to follow - SSI for DM   This patient is critically ill and at significant risk of neurological worsening, death and care requires constant monitoring of vital signs, hemodynamics,respiratory and cardiac monitoring, neurological assessment, discussion with family, other specialists and medical decision making of high complexity. I spent 50 minutes of neurocritical care time  in the care of  this patient.  Ritta Slot, MD [facility] [phone]  If 7pm- 7am, please page neurology on call as listed in AMION. 2018-01-25  6:17 AM

## 2017-12-31 NOTE — Anesthesia Procedure Notes (Signed)
Arterial Line Insertion Start/End09/21/2019 6:45 AM, 12/31/2017 7:00 AM Performed by: anesthesiologist  Patient location: OOR procedure area. Preanesthetic checklist: patient identified, IV checked, site marked, risks and benefits discussed, surgical consent, monitors and equipment checked, pre-op evaluation, timeout performed and anesthesia consent Left, radial was placed Catheter size: 20 G Hand hygiene performed , maximum sterile barriers used  and Seldinger technique used Allen's test indicative of satisfactory collateral circulation Attempts: 2 Procedure performed using ultrasound guided technique. Ultrasound Notes:anatomy identified, needle tip was noted to be adjacent to the nerve/plexus identified and no ultrasound evidence of intravascular and/or intraneural injection Following insertion, dressing applied and Biopatch. Post procedure assessment: normal  Patient tolerated the procedure well with no immediate complications.

## 2017-12-31 NOTE — Anesthesia Procedure Notes (Signed)
Procedure Name: Intubation Date/Time: 12/21/2017 6:45 AM Performed by: Adria Dillonkin, Muhanad Torosyan A, CRNA Pre-anesthesia Checklist: Patient identified, Emergency Drugs available, Suction available and Patient being monitored Patient Re-evaluated:Patient Re-evaluated prior to induction Oxygen Delivery Method: Circle system utilized Preoxygenation: Pre-oxygenation with 100% oxygen Induction Type: IV induction Ventilation: Mask ventilation without difficulty Laryngoscope Size: Miller and 2 Grade View: Grade I Tube type: Subglottic suction tube Tube size: 7.5 mm Number of attempts: 1 Airway Equipment and Method: Stylet Placement Confirmation: ETT inserted through vocal cords under direct vision,  positive ETCO2,  CO2 detector and breath sounds checked- equal and bilateral Secured at: 23 cm Tube secured with: Tape Dental Injury: Teeth and Oropharynx as per pre-operative assessment

## 2017-12-31 NOTE — Anesthesia Preprocedure Evaluation (Addendum)
Anesthesia Evaluation  Patient identified by MRN, date of birth, ID band  Reviewed: Unable to perform ROS - Chart review onlyPreop documentation limited or incomplete due to emergent nature of procedure.  Airway Mallampati: II  TM Distance: >3 FB     Dental   Pulmonary former smoker,    Pulmonary exam normal breath sounds clear to auscultation       Cardiovascular hypertension, + CAD, + Past MI and + Peripheral Vascular Disease  + dysrhythmias Atrial Fibrillation + pacemaker  Rhythm:Regular Rate:Normal  Paced rhythm   Neuro/Psych  Neuromuscular disease CVA, Residual Symptoms    GI/Hepatic   Endo/Other  diabetesHypothyroidism   Renal/GU      Musculoskeletal   Abdominal (+) + obese,   Peds  Hematology   Anesthesia Other Findings Acute stroke--> Code Stroke caled  Ejection Fraction of 25% improved to 45-50% following biventicular pacing, Status post biventricular dual-chamber ICD - [employer] Livian H22  Reproductive/Obstetrics                            Anesthesia Physical Anesthesia Plan  ASA: IV and emergent  Anesthesia Plan: General   Post-op Pain Management:    Induction: Intravenous  PONV Risk Score and Plan: 3 and Treatment may vary due to age or medical condition, Ondansetron and Dexamethasone  Airway Management Planned: Oral ETT  Additional Equipment: Arterial line  Intra-op Plan:   Post-operative Plan: Post-operative intubation/ventilation  Informed Consent: I have reviewed the patients History and Physical, chart, labs and discussed the procedure including the risks, benefits and alternatives for the proposed anesthesia with the patient or authorized representative who has indicated his/her understanding and acceptance.   Dental advisory given  Plan Discussed with: CRNA and Surgeon  Anesthesia Plan Comments:        Anesthesia Quick Evaluation

## 2017-12-31 NOTE — Progress Notes (Signed)
Patient ID: [name] FrayNellie A Stachnik, female   DOB: 16-Sep-1945, 72 y.o.   MRN: [id number] INR retrotonsillar abscess radial approach used to perform endovascukar treatment.Both LE pulses non palpable ,nor dopplerable. Strong Rt radial and brachial pulse palpated. Level 2 Bouret test confirming  RT  dorso-palmer anastomosis. After the termination of the procedure wrist band applied without difficulty.  RT Radial pulse and  RT  brachial pulse present  With the wrist band in position. A 5cm x 5 cm superficial swelling noted in the prox RT forearm. Soft to touch US probable  superficial hematoma not compromising vascular supply .Circumferential bandage applied... S.Hyla Coard MD

## 2018-01-01 ENCOUNTER — Inpatient Hospital Stay: Payer: Self-pay

## 2018-01-01 ENCOUNTER — Inpatient Hospital Stay (HOSPITAL_COMMUNITY): Payer: Medicare Other

## 2018-01-01 DIAGNOSIS — E1159 Type 2 diabetes mellitus with other circulatory complications: Secondary | ICD-10-CM

## 2018-01-01 DIAGNOSIS — J9600 Acute respiratory failure, unspecified whether with hypoxia or hypercapnia: Secondary | ICD-10-CM

## 2018-01-01 DIAGNOSIS — E785 Hyperlipidemia, unspecified: Secondary | ICD-10-CM

## 2018-01-01 DIAGNOSIS — Z794 Long term (current) use of insulin: Secondary | ICD-10-CM

## 2018-01-01 DIAGNOSIS — R739 Hyperglycemia, unspecified: Secondary | ICD-10-CM

## 2018-01-01 LAB — CBC WITH DIFFERENTIAL/PLATELET
Abs Immature Granulocytes: 0 10*3/uL (ref 0.0–0.1)
BASOS PCT: 1 %
Basophils Absolute: 0.1 10*3/uL (ref 0.0–0.1)
EOS ABS: 0.1 10*3/uL (ref 0.0–0.7)
Eosinophils Relative: 1 %
HEMATOCRIT: 34 % — AB (ref 36.0–46.0)
HEMOGLOBIN: 10.4 g/dL — AB (ref 12.0–15.0)
Immature Granulocytes: 0 %
Lymphocytes Relative: 16 %
Lymphs Abs: 1.8 10*3/uL (ref 0.7–4.0)
MCH: 29.5 pg (ref 26.0–34.0)
MCHC: 30.6 g/dL (ref 30.0–36.0)
MCV: 96.3 fL (ref 78.0–100.0)
Monocytes Absolute: 1 10*3/uL (ref 0.1–1.0)
Monocytes Relative: 9 %
Neutro Abs: 8 10*3/uL — ABNORMAL HIGH (ref 1.7–7.7)
Neutrophils Relative %: 73 %
Platelets: 310 10*3/uL (ref 150–400)
RBC: 3.53 MIL/uL — ABNORMAL LOW (ref 3.87–5.11)
RDW: 13.6 % (ref 11.5–15.5)
WBC: 10.8 10*3/uL — AB (ref 4.0–10.5)

## 2018-01-01 LAB — BASIC METABOLIC PANEL
Anion gap: 10 (ref 5–15)
BUN: 13 mg/dL (ref 8–23)
CHLORIDE: 111 mmol/L (ref 98–111)
CO2: 18 mmol/L — AB (ref 22–32)
CREATININE: 0.78 mg/dL (ref 0.44–1.00)
Calcium: 7.8 mg/dL — ABNORMAL LOW (ref 8.9–10.3)
GFR calc Af Amer: >60 mL/min (ref >60)
GFR calc non Af Amer: >60 mL/min (ref >60)
Glucose, Bld: 210 mg/dL — ABNORMAL HIGH (ref 70–99)
POTASSIUM: 3.6 mmol/L (ref 3.5–5.1)
Sodium: 139 mmol/L (ref 135–145)

## 2018-01-01 LAB — GLUCOSE, CAPILLARY
GLUCOSE-CAPILLARY: 220 mg/dL — AB (ref 70–99)
Glucose-Capillary: 237 mg/dL — ABNORMAL HIGH (ref 70–99)
Glucose-Capillary: 192 mg/dL — ABNORMAL HIGH (ref 70–99)
Glucose-Capillary: 182 mg/dL — ABNORMAL HIGH (ref 70–99)
Glucose-Capillary: 169 mg/dL — ABNORMAL HIGH (ref 70–99)

## 2018-01-01 LAB — POCT I-STAT 3, ART BLOOD GAS (G3+)
Acid-base deficit: 5 mmol/L — ABNORMAL HIGH (ref 0.0–2.0)
BICARBONATE: 20.1 mmol/L (ref 20.0–28.0)
O2 SAT: 99 %
TCO2: 21 mmol/L — AB (ref 22–32)
pCO2 arterial: 35.8 mmHg (ref 32.0–48.0)
pH, Arterial: 7.356 (ref 7.350–7.450)
pO2, Arterial: 164 mmHg — ABNORMAL HIGH (ref 83.0–108.0)

## 2018-01-01 LAB — PROCALCITONIN

## 2018-01-01 LAB — LIPID PANEL
CHOL/HDL RATIO: 6.4 ratio
Cholesterol: 135 mg/dL (ref 0–200)
HDL: 21 mg/dL — ABNORMAL LOW (ref >40)
LDL CALC: 73 mg/dL (ref 0–99)
Triglycerides: 206 mg/dL — ABNORMAL HIGH (ref <150)
VLDL: 41 mg/dL — AB (ref 0–40)

## 2018-01-01 LAB — PROTIME-INR
INR: 1.25
PROTHROMBIN TIME: 15.6 s — AB (ref 11.4–15.2)

## 2018-01-01 LAB — HIV ANTIBODY (ROUTINE TESTING W REFLEX): HIV Screen 4th Generation wRfx: NONREACTIVE

## 2018-01-01 LAB — SODIUM
SODIUM: 139 mmol/L (ref 135–145)
Sodium: 142 mmol/L (ref 135–145)

## 2018-01-01 LAB — TROPONIN I: Troponin I: 0.03 ng/mL (ref <0.03)

## 2018-01-01 LAB — PHOSPHORUS: Phosphorus: 2.7 mg/dL (ref 2.5–4.6)

## 2018-01-01 LAB — MAGNESIUM: MAGNESIUM: 1.7 mg/dL (ref 1.7–2.4)

## 2018-01-01 LAB — BRAIN NATRIURETIC PEPTIDE: B Natriuretic Peptide: 199.9 pg/mL — ABNORMAL HIGH (ref 0.0–100.0)

## 2018-01-01 LAB — TSH: TSH: 0.267 u[IU]/mL — AB (ref 0.350–4.500)

## 2018-01-01 MED ORDER — POTASSIUM CHLORIDE 20 MEQ/15ML (10%) PO SOLN
40.0000 meq | Freq: Two times a day (BID) | ORAL | Status: AC
  Administered 2018-01-01 (×2): 40 meq via ORAL
  Filled 2018-01-01 (×2): qty 30

## 2018-01-01 MED ORDER — INSULIN ASPART 100 UNIT/ML ~~LOC~~ SOLN
0.0000 [IU] | SUBCUTANEOUS | Status: DC
  Administered 2018-01-01: 7 [IU] via SUBCUTANEOUS
  Administered 2018-01-01 – 2018-01-02 (×3): 4 [IU] via SUBCUTANEOUS
  Administered 2018-01-02 (×3): 7 [IU] via SUBCUTANEOUS
  Administered 2018-01-02: 4 [IU] via SUBCUTANEOUS
  Administered 2018-01-02: 7 [IU] via SUBCUTANEOUS
  Administered 2018-01-03: 11 [IU] via SUBCUTANEOUS
  Administered 2018-01-03 (×4): 7 [IU] via SUBCUTANEOUS
  Administered 2018-01-03: 15 [IU] via SUBCUTANEOUS
  Administered 2018-01-04: 4 [IU] via SUBCUTANEOUS
  Administered 2018-01-04: 7 [IU] via SUBCUTANEOUS
  Administered 2018-01-04: 4 [IU] via SUBCUTANEOUS
  Administered 2018-01-04: 3 [IU] via SUBCUTANEOUS
  Administered 2018-01-04: 7 [IU] via SUBCUTANEOUS
  Administered 2018-01-04: 4 [IU] via SUBCUTANEOUS
  Administered 2018-01-05: 7 [IU] via SUBCUTANEOUS
  Administered 2018-01-05 (×3): 4 [IU] via SUBCUTANEOUS
  Administered 2018-01-05: 7 [IU] via SUBCUTANEOUS
  Administered 2018-01-05: 3 [IU] via SUBCUTANEOUS
  Administered 2018-01-06 (×3): 4 [IU] via SUBCUTANEOUS
  Administered 2018-01-06: 7 [IU] via SUBCUTANEOUS
  Administered 2018-01-06 – 2018-01-07 (×4): 4 [IU] via SUBCUTANEOUS
  Administered 2018-01-07: 7 [IU] via SUBCUTANEOUS
  Administered 2018-01-07: 11 [IU] via SUBCUTANEOUS
  Administered 2018-01-07: 7 [IU] via SUBCUTANEOUS
  Administered 2018-01-07 – 2018-01-08 (×3): 4 [IU] via SUBCUTANEOUS
  Administered 2018-01-08: 7 [IU] via SUBCUTANEOUS
  Administered 2018-01-08 (×2): 4 [IU] via SUBCUTANEOUS
  Administered 2018-01-08: 7 [IU] via SUBCUTANEOUS
  Administered 2018-01-09 (×3): 4 [IU] via SUBCUTANEOUS
  Administered 2018-01-09: 11 [IU] via SUBCUTANEOUS
  Administered 2018-01-09 (×2): 7 [IU] via SUBCUTANEOUS
  Administered 2018-01-10: 4 [IU] via SUBCUTANEOUS
  Administered 2018-01-10 (×2): 7 [IU] via SUBCUTANEOUS

## 2018-01-01 MED ORDER — SODIUM CHLORIDE 0.9 % IV SOLN
1.0000 g | Freq: Once | INTRAVENOUS | Status: AC
  Administered 2018-01-01: 1 g via INTRAVENOUS
  Filled 2018-01-01: qty 10

## 2018-01-01 MED ORDER — SODIUM CHLORIDE 0.9% FLUSH
10.0000 mL | Freq: Two times a day (BID) | INTRAVENOUS | Status: DC
  Administered 2018-01-01 – 2018-01-06 (×6): 10 mL
  Administered 2018-01-06: 20 mL
  Administered 2018-01-07 – 2018-01-08 (×3): 10 mL
  Administered 2018-01-09: 20 mL

## 2018-01-01 MED ORDER — SODIUM CHLORIDE 3 % IV SOLN
INTRAVENOUS | Status: DC
  Administered 2018-01-01: 50 mL/h via INTRAVENOUS
  Administered 2018-01-02: 25 mL/h via INTRAVENOUS
  Filled 2018-01-01 (×4): qty 500

## 2018-01-01 MED ORDER — PANTOPRAZOLE SODIUM 40 MG PO TBEC: 40.0000 mg | DELAYED_RELEASE_TABLET | Freq: Every day | ORAL | Status: DC

## 2018-01-01 MED ORDER — PANTOPRAZOLE SODIUM 40 MG PO PACK
40.0000 mg | PACK | Freq: Every day | ORAL | Status: DC
  Administered 2018-01-01 – 2018-01-09 (×9): 40 mg
  Filled 2018-01-01 (×9): qty 20

## 2018-01-01 MED ORDER — CHLORHEXIDINE GLUCONATE CLOTH 2 % EX PADS
6.0000 | MEDICATED_PAD | Freq: Every day | CUTANEOUS | Status: DC
  Administered 2018-01-02 – 2018-01-10 (×9): 6 via TOPICAL

## 2018-01-01 MED ORDER — MAGNESIUM SULFATE 2 GM/50ML IV SOLN
2.0000 g | Freq: Once | INTRAVENOUS | Status: AC
  Administered 2018-01-01: 2 g via INTRAVENOUS
  Filled 2018-01-01: qty 50

## 2018-01-01 MED ORDER — INSULIN GLARGINE 100 UNIT/ML ~~LOC~~ SOLN
20.0000 [IU] | Freq: Every day | SUBCUTANEOUS | Status: DC
  Administered 2018-01-01 – 2018-01-02 (×2): 20 [IU] via SUBCUTANEOUS
  Filled 2018-01-01 (×2): qty 0.2

## 2018-01-01 MED ORDER — PHENYLEPHRINE HCL-NACL 10-0.9 MG/250ML-% IV SOLN
0.0000 ug/min | INTRAVENOUS | Status: DC
  Administered 2018-01-01: 20 ug/min via INTRAVENOUS
  Filled 2018-01-01: qty 250

## 2018-01-01 MED ORDER — FUROSEMIDE 10 MG/ML IJ SOLN
20.0000 mg | Freq: Once | INTRAMUSCULAR | Status: AC
  Administered 2018-01-01: 20 mg via INTRAVENOUS
  Filled 2018-01-01: qty 2

## 2018-01-01 MED ORDER — SODIUM CHLORIDE 0.9% FLUSH: 10.0000 mL | INTRAVENOUS | Status: DC | PRN

## 2018-01-01 NOTE — Progress Notes (Signed)
Paged on call Neuro MD patient lethargic to follow commands, and persistent hypotension.  CCM consulted was informed that orders for pressors will be entered.

## 2018-01-01 NOTE — Progress Notes (Signed)
Peripherally Inserted Central Catheter/Midline Placement  The IV Nurse has discussed with the patient and/or persons authorized to consent for the patient, the purpose of this procedure and the potential benefits and risks involved with this procedure.  The benefits include less needle sticks, lab draws from the catheter, and the patient may be discharged home with the catheter. Risks include, but not limited to, infection, bleeding, blood clot (thrombus formation), and puncture of an artery; nerve damage and irregular heartbeat and possibility to perform a PICC exchange if needed/ordered by physician.  Alternatives to this procedure were also discussed.  Bard Power PICC patient education guide, fact sheet on infection prevention and patient information card has been provided to patient /or left at bedside.  Son at bedside signed consent for PICC, at the bedside with two family members.  PICC/Midline Placement Documentation  PICC Double Lumen 01/01/18 PICC Right Brachial 45 cm 0 cm (Active)  Indication for Insertion or Continuance of Line Administration of hyperosmolar/irritating solutions (i.e. TPN, Vancomycin, etc.) 01/01/2018  5:38 PM  Exposed Catheter (cm) 0 cm 01/01/2018  5:38 PM  Site Assessment Clean;Dry;Intact 01/01/2018  5:38 PM  Lumen #1 Status Flushed;Saline locked;Blood return noted 01/01/2018  5:38 PM  Lumen #2 Status Flushed;Saline locked;Blood return noted 01/01/2018  5:38 PM  Dressing Type Transparent 01/01/2018  5:38 PM  Dressing Status Clean;Intact;Dry;Antimicrobial disc in place 01/01/2018  5:38 PM  Line Care Connections checked and tightened 01/01/2018  5:38 PM  Line Adjustment (NICU/IV Team Only) No 01/01/2018  5:38 PM  Dressing Intervention New dressing 01/01/2018  5:38 PM  Dressing Change Due 01/08/18 01/01/2018  5:38 PM       [name] [name], [name] [name] 01/01/2018, 5:40 PM

## 2018-01-01 NOTE — Progress Notes (Signed)
PT Cancellation Note  Patient Details Name: [name] [name] MRN: [id number] DOB: Jun 25, 1945   Cancelled Treatment:    Reason Eval/Treat Not Completed: Medical issues which prohibited therapy(intubated and not currently appropriate)   [name] [name] 01/01/2018, 6:55 AM  Delaney MeigsMaija Tabor Milfred Krammes, PT Acute Rehabilitation Services Pager: [phone] Office: [phone]

## 2018-01-01 NOTE — Progress Notes (Signed)
Pt. No longer following commands.  Will move extremities to painful stimulus.  Neuro MD alerted.  Stat  CT scan ordered.

## 2018-01-01 NOTE — Consult Note (Addendum)
NAME:  [name] [name], MRN:  [id number], DOB:  11/13/1945, LOS: 1 ADMISSION DATE:  2018-01-07, CONSULTATION DATE:  9/20 REFERRING MD: Pearlean Brownie , CHIEF COMPLAINT:     Brief History   72 y.o. female with a history of PVD and CHF as well as afib on eliquis who was last seen by family around 7:30 PM.  She arrived to the ED 9/20 am  and she was severely  Dysarthric with right sided weakness, but  able to understand and nod/shake her head.  She was  asked  if she was normal when she awoke at 1 AM. She indicates that she was normal at 1 am.  She indicates that she  developed symptoms after that. Exact time of onset after 1 am  is uncertain.  She has a history of CHF and peripheral vascular disease . CT Head was + for Cerebral infarction due to embolism of right middle cerebral artery. She was out of window for tpa, but within time frame for acute IR intervention. Of interest coags were normal, so Eliquis compliance is questionable. Pt. Was intubated for revascularization. PCCM have been consulted for vent management and assistance with ICU care. Significant Hospital Events   9/20>> Admission 9/20>> CT Brain: Positive CTA for EVLO with occlusive thrombus within the distal basilar artery.  Consults: date of consult/date signed off & final recs:  Pulmonary 9/20 IR 9/20 PT/OT/ Speech 9/20 Stroke Team 9/20  Procedures (surgical and bedside):  9/20  SP RT Vertbral artery angiogram followed complete revascularization og occluded mid to distal basilar artery  Significant Diagnostic Tests:  Jan 07, 2018>>  CT head 9/20>> CT Brain: Positive CTA for EVLO with occlusive thrombus within the distal basilar artery. 2018-01-07 Echo>>  Extremely limited; EF 35-40%, definity used; apical akinesis; hypokinesis of   the basal inferior wall; overall moderate LV dysfunction;   moderate LVE; mild LVH; trace AI; mild MR; moderate LAE. 06/2017 Echo Left ventricle: LVEF is approximately 40% with akiensis of the   distal  inferior/inferoseptal and apical walls; hypokinesis of the   distal anterior and lateral walls. The cavity size was moderately   dilated. Wall thickness was increased in a pattern of mild LVH. - Mitral valve: There was mild regurgitation. - Left atrium: The atrium was severely dilated. - Right atrium: The atrium was mildly dilated. - Pulmonary arteries: PA peak pressure: 40 mm Hg (S).  Micro Data: 12/07/2017>> Recent toe infection>>STAPHYLOCOCCUS AUREUS    Antimicrobials:  9/20>> None   Subjective:  Off all sedation 9/21 am. Movement of all 4 extremities spontaneously, following some  Commands with L side only intermittently. + 1400 cc last 24 T max 100.3 but PCT < 0.10 Troponin 0.03 Required neo 9/20 pm, but now off with adequate BP  Objective   Blood pressure (!) 137/52, pulse 70, temperature 99.1 F (37.3 C), temperature source Axillary, resp. rate (!) 27, height 5\' 1"  (1.549 m), weight 112.3 kg, SpO2 100 %.    Vent Mode: PCV FiO2 (%):  [40 %-100 %] 40 % Set Rate:  [15 bmp] 15 bmp Vt Set:  [450 mL] 450 mL PEEP:  [5 cmH20] 5 cmH20 Plateau Pressure:  [12 cmH20-22 cmH20] 16 cmH20   Intake/Output Summary (Last 24 hours) at 01/01/2018 0816 Last data filed at 01/01/2018 0800 Gross per 24 hour  Intake 2832.44 ml  Output 960 ml  Net 1872.44 ml   Filed Weights   2018/01/07 1130  Weight: 112.3 kg    Examination: General: Remains intubated, sedation is  off, following some commands HENT: NCAT, thick neck, No LAD, No JVD, ETT secure , OG tube secure Lungs: Bilateral chest excursion, CTA, synchronous with vent Cardiovascular: S1, S2, PAC's and PVC's noted, No RMG Abdomen: Obese,  BS+ , with OG clamped, NT, ND, Body mass index is 46.78 kg/m.  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 Placement of rescue stent across RT PCA ,Bilateral pcoms. Sedated and intubated Suspect some brain stem infarct per neuro 9/21>> edema on follow up scan Na 139 Plan: BP goal 120-140 systolic Cleviprex off 9/21 am Coreg and Vasotec as ordered Brilinta BID Frequent neuro checks Monitor for seizures Follow up imaging per neuro Rest per Neuro Primary Will add 3% saline at 50 cc/he Q 6 sodiums Place PICC  Acute Respiratory Insufficiency in setting of Cerebral infarction due to embolism of right middle cerebral artery and IR re-vascularization Placed on PC upon arrival to ICU Former smoker quit 1993 Difficult intubation 9/20 Failed SBT 9/21 am >> Elevated BP/ RR Blood tinged secretions 9/21 Plan: Trend CXR No further weaning 9/21 Wean oxygen and titrate PEEP as needed Saturation goals are 88-92% Albuterol nebs prn Monitor bloody secretions per glottic suction and ETT Minimize sedation RASS Goal -1   Hx. Atrial Fibrillation on Eliquis  Hx. CHF, Pulmonary HTN + 1400 cc  since admission Hx PVD Ectopy 9/21 Troponin 0.03 Plan: Echo results noted Tele monitoring EKG prn Trend troponin BNP Monitor I&O daily Lasix x 1 dose CXR daily Trend lactate as needed Consider cards consult Mag 2 grams repletion    Renal: Hypocalcemia Plan: Trend BMET and UO daily Replete electrolytes prn ( K+, Mag 9/21) Monitor I&O closely Will replete 1 gram now Will check ionized calcium  Hematology Eliquis at home Leukocytosis Plan PAS hose for DVT prophylaxis  9/20 Starting 01/01/2018>> Heparin SQ CBC daily PT/INR in am Monitor for bleeding Transfuse for HGB < 7  ID Leukocytosis 12/07/2017>> Recent toe infection + for STAPHYLOCOCCUS AUREUS PCT  WNL  9/21 Plan: Trend fever curve and WBC CBC daily Culture as is clinically indicated Trend PCT PCT to guide ABX therapy  Endocrine + DM + hypothyroid Hyperglycemic Plan: Synthroid as ordered CBG Q 4 SSI as ordered HGB A1C TSH   GI Elevated Triglycerides Most likely 2/2 Cleviprex/ propofol>> Both now off Plan: Trend Triglycerides SUP pepcid Dietitian consult for TF  Neuro>> As above   Family at bedside updated 9/21. They state they have had Living Will discussions with patient but want to see what her functional status will be as she continues to recover before making decisions regarding goals of care..    Disposition / Summary of Today's Plan 01/01/18   SP revascularization 9/20 ABG/ CXR Failed wean 9/21 ( HR, RR, BP)     Diet: NPO Pain/Anxiety/Delirium protocol : Propofol VAP protocol  DVT prophylaxis: SQ Heparin starting 9/21, PAS hose 9/20  GI prophylaxis: Pepcid Hyperglycemia protocol: CBG/ SSI Mobility:BR Code Status: FC Family Communication:Updated 9/21  Labs   CBC: Recent Labs  Lab 01/01/2018 0450 12/24/2017 0453 01/01/18 0323  WBC 11.6*  --  10.8*  NEUTROABS 7.8*  --  8.0*  HGB 14.2 14.6 10.4*  HCT 44.0 43.0 34.0*  MCV 95.0  --  96.3  PLT 378  --  310   Basic Metabolic Panel: Recent Labs  Lab 12/30/17 1004 12/21/2017 0450 12/29/2017 0453 01/01/18 0323  NA 139 137 138 139  K 4.8 4.7 4.7 3.6  CL 104 103 105 111  CO2 28 26  --  18*  GLUCOSE 170* 286* 281* 210*  BUN 17 20 22 13   CREATININE 0.88 0.87 0.80 0.78  CALCIUM 9.6 9.5  --  7.8*  MG  --   --   --  1.7  PHOS  --   --   --  2.7   GFR: Estimated Creatinine Clearance: 76 mL/min (by C-G formula based on SCr of 0.78 mg/dL). Recent Labs  Lab 01/02/2018 0450 01/01/18 0323  PROCALCITON  --   <0.10  WBC 11.6* 10.8*   Liver Function Tests: Recent Labs  Lab 12/30/17 1004 12/26/2017 0450  AST 18 20  ALT 14 17  ALKPHOS  --  50  BILITOT 0.8 0.8  PROT 7.7 8.7*  ALBUMIN  --  4.3   No results for input(s): LIPASE, AMYLASE in the last 168 hours. No results for input(s): AMMONIA in the last 168 hours. ABG    Component Value Date/Time   PHART 7.356 01/01/2018 0333   PCO2ART 35.8 01/01/2018 0333   PO2ART 164.0 (H) 01/01/2018 0333   HCO3 20.1 01/01/2018 0333   TCO2 21 (L) 01/01/2018 0333   ACIDBASEDEF 5.0 (H) 01/01/2018 0333   O2SAT 99.0 01/01/2018 0333    Coagulation Profile: Recent Labs  Lab 12/27/2017 0450 01/01/18 0323  INR 1.12 1.25   Cardiac Enzymes: Recent Labs  Lab 01/01/18 0323  TROPONINI 0.03*   HbA1C: Hgb A1c MFr Bld  Date/Time Value Ref Range Status  12/30/2017 10:04 AM 8.4 (H) <5.7 % of total Hgb Final    Comment:    For someone without known diabetes, a hemoglobin A1c value of 6.5% or greater indicates that they may have  diabetes and this should be confirmed with a follow-up  test. . For someone with known diabetes, a value <7% indicates  that their diabetes is well controlled and a value  greater than or equal to 7% indicates suboptimal  control. A1c targets should be individualized based on  duration of diabetes, age, comorbid conditions, and  other considerations. . Currently, no consensus exists regarding use of hemoglobin A1c for diagnosis of diabetes for children. Marland Kitchen   08/09/2017 09:03 AM 9.2 (H) <5.7 % of total Hgb Final    Comment:    For someone without known diabetes, a hemoglobin A1c value of 6.5% or greater indicates that they may have  diabetes and this should be confirmed with a follow-up  test. . For someone with known diabetes, a value <7% indicates  that their diabetes is well controlled and a value  greater than or equal to 7% indicates suboptimal  control. A1c targets should be individualized based on  duration of  diabetes, age, comorbid conditions, and  other considerations. . Currently, no consensus exists regarding use of hemoglobin A1c for diagnosis of diabetes for children. .    CBG: Recent Labs  Lab 01/06/2018 0448 12/30/2017 1136 12/29/2017  1406 12/20/2017 2156 01/01/18 0717  GLUCAP 277* 222* 251* 209* 237*    Admitting History of Present Illness.   72 y.o. female with a history of PVD and CHF as well as afib on eliquis who was last seen by family around 7:30 PM.  She arrived to the ED 9/20 am  and she was severely  Dysarthric with right sided weakness, but  able to understand and nod/shake her head.  She was  asked  if she was normal when she awoke at 1 AM. She indicates that she was normal at 1 am.  She indicates that she  developed symptoms after that. Exact time of onset  is uncertain , but it was after 1 am.  She has a history of CHF and peripheral vascular disease . CT Head was + for Cerebral infarction due to embolism of right middle cerebral artery. She was out of window for tpa, but within time frame for acute IR intervention. Of interest coags were normal, so Eliquis compliance is questionable. Pt. Was intubated for revascularization. PCCM have been consulted for vent management and assistance with ICU care. Review of Systems:   Unable : intubated and sedated Past medical history  She,  has a past medical history of Atrial fibrillation (HCC), Chronic systolic heart failure (HCC), Colon polyps, Coronary atherosclerosis of native coronary artery, Diabetes mellitus, type 2 (HCC), Essential hypertension, benign, Hyperlipidemia, Hypothyroidism, Hypothyroidism, Ischemic cardiomyopathy, Myocardial infarction [facility](HCC), PVD (peripheral vascular disease) (HCC), Status cardiac pacemaker, and Ulcer.   Surgical History    Past Surgical History:  Procedure Laterality Date  . ABDOMINAL AORTAGRAM N/A 08/04/2013   Procedure: ABDOMINAL Ronny FlurryAORTAGRAM;  Surgeon: Sherren Kernsharles E Fields, MD;  [facility];   Service: Cardiovascular;  Laterality: N/A;  . APPENDECTOMY    . BI-VENTRICULAR PACEMAKER INSERTION N/A 11/20/2013   Procedure: BI-VENTRICULAR PACEMAKER INSERTION (CRT-P);  Surgeon: Marinus MawGregg W Taylor, MD;  [facility];  Service: Cardiovascular;  Laterality: N/A;  . Bilateral tubal ligation    . CARDIAC DEFIBRILLATOR PLACEMENT     [facility] [place]  . CESAREAN SECTION    . CORONARY ARTERY BYPASS GRAFT  2001   [facility] [place] to LAD, SVG to ramus, SVG to OM/PLB, SVG to RCA   . FLEXIBLE SIGMOIDOSCOPY  07/19/1998   Dr. Karilyn Cotaehman  . TONSILLECTOMY       Social History   Social History   Socioeconomic History  . Marital status: Divorced    Spouse name: Not on file  . Number of children: 1  . Years of education: Not on file  . Highest education level: Not on file  Occupational History  . Occupation: Full Time: [employer]  . 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 Financial resource strain: Not on file  . Food insecurity:    Worry: Not on file    Inability: Not on file  . Transportation needs:    Medical: Not on file    Non-medical: Not on file  Tobacco Use  . Smoking status: Former Smoker    Last attempt to quit: 09/16/1991    Years since quitting: 26.3  . Smokeless tobacco: Never Used  Substance and Sexual Activity  . Alcohol use: No    Alcohol/week: 0.0 standard drinks  . Drug use: No  . Sexual activity: Never    Birth control/protection: Post-menopausal  Lifestyle  . Physical activity:    Days per week: Not on file    Minutes  per session: Not on file  . Stress: Not on file  Relationships  . Social connections:    Talks on phone: Not on file    Gets together: Not on file    Attends religious service: Not on file    Active member of club or organization: Not on file    Attends meetings of clubs or organizations: Not on file    Relationship status: Not on file  . Intimate partner violence:    Fear of current or ex partner:  Not on file    Emotionally abused: Not on file    Physically abused: Not on file    Forced sexual activity: Not on file  Other Topics Concern  . Not on file  Social History Narrative  . Not on file  ,  reports that she quit smoking about 26 years ago. She has never used smokeless tobacco. She reports that she does not drink alcohol or use drugs.   Family history   Her family history includes AAA (abdominal aortic aneurysm) in her father; Coronary artery disease (age of onset: 50) in her sister; Diabetes in her father, mother, and sister; Heart attack in her father and sister; Heart disease in her father, mother, and sister; Hyperlipidemia in her father and mother; Hypertension in her father and mother; Ulcers in her mother.   Allergies Allergies  Allergen Reactions  . Rosiglitazone Other (See Comments)    CHF  . Tapentadol Rash  . Tape     Left rash per patient  . Celecoxib Other (See Comments)    Congestive heart failure    Home meds  Prior to Admission medications   Medication Sig Start Date End Date Taking? Authorizing Provider  ALPRAZolam (XANAX) 1 MG tablet TAKE ONE-HALF TO ONE TABLET BY MOUTH TWOTIMES A DAY AS NEEDED FOR ANXIETY Patient taking differently: Take 0.5 mg by mouth 2 (two) times daily as needed. TAKE ONE-HALF TO ONE TABLET BY MOUTH TWOTIMES A DAY AS NEEDED FOR ANXIETY 10/07/17  Yes Campbell Riches, NP  apixaban (ELIQUIS) 5 MG TABS tablet Take 1 tablet (5 mg total) by mouth 2 (two) times daily. 11/05/16  Yes Marinus Maw, MD  carvedilol (COREG) 25 MG tablet TAKE ONE TABLET TWICE DAILY WITH A MEAL Patient taking differently: Take 12.5 mg by mouth 2 (two) times daily with a meal. TAKE ONE TABLET TWICE DAILY WITH A MEAL 11/05/16  Yes Marinus Maw, MD  Cholecalciferol (VITAMIN D3) 5000 units CAPS Take 1 capsule (5,000 Units total) by mouth daily. 08/19/17  Yes Roma Kayser, MD  enalapril (VASOTEC) 10 MG tablet Take 1 tablet (10 mg total) by mouth 2 (two)  times daily. Patient taking differently: Take 10 mg by mouth daily.  11/05/16  Yes Marinus Maw, MD  furosemide (LASIX) 40 MG tablet Take 1 tablet (40 mg total) by mouth 2 (two) times daily. 11/05/16  Yes Marinus Maw, MD  insulin lispro (HUMALOG KWIKPEN) 100 UNIT/ML KiwkPen Inject 0.24-0.3 mLs (24-30 Units total) into the skin 3 (three) times daily. 08/19/17  Yes Nida, Denman George, MD  LEVEMIR FLEXTOUCH 100 UNIT/ML Pen INJECT 80 UNITS INTO THE SKIN DAILY AT 10:00PM Patient taking differently: Inject 80 Units into the skin at bedtime.  08/11/17  Yes Nida, Denman George, MD  multivitamin [facility]) per tablet Take 1 tablet by mouth daily.     Yes [provider]  Omega-3 Fatty Acids (FISH OIL) 1000 MG CAPS Take 1,000 mg by mouth daily.  Yes [provider]  potassium chloride SA (K-DUR,KLOR-CON) 20 MEQ tablet Take 1 tablet (20 mEq total) by mouth 2 (two) times daily. 06/10/17  Yes Jonelle Sidle, MD  SYNTHROID 125 MCG tablet TAKE ONE (1) TABLET BY MOUTH EVERY DAY Patient taking differently: Take 125 mcg by mouth daily before breakfast.  10/07/17  Yes Nida, Denman George, MD  traMADol (ULTRAM) 50 MG tablet TAKE ONE TABLET ONCE DAILY FOR MODERATE PAIN Patient taking differently: Take 50 mg by mouth as needed for moderate pain. TAKE ONE TABLET ONCE DAILY FOR MODERATE PAIN 06/11/17  Yes Merlyn Albert, MD  VICTOZA 18 MG/3ML SOPN INJECT 0.3ML'S (1.8MG  TOTAL) INTO THE SKIN ONCE DAILY Patient taking differently: Inject 1.8 mg into the skin daily.  07/12/17  Yes Roma Kayser, MD  vitamin E 600 UNIT capsule Take 600 Units by mouth daily.     Yes [provider]  ACCU-CHEK AVIVA PLUS test strip USE TO TEST GLUCOSE 4 TIMES A DAY 12/27/17   Roma Kayser, MD  Blood Glucose Monitoring Suppl (ACCU-CHEK AVIVA) device Use as instructed 05/06/15   Roma Kayser, MD  Continuous Blood Gluc Sensor (FREESTYLE LIBRE SENSOR SYSTEM) MISC Use one sensor every 10  days. 12/24/16   Roma Kayser, MD  glucose blood test strip Accu-Chek Aviva Plus test strips    [provider]  Insulin Pen Needle (B-D ULTRAFINE III SHORT PEN) 31G X 8 MM MISC Use 5 x daily as directed 09/02/16   Roma Kayser, MD  OVER THE COUNTER MEDICATION Take 1 tablet by mouth daily. Phyomega vitamin.    [provider]      Bevelyn Ngo, AGACNP-BC [facility] Pulmonary/Critical Care Medicine Pager # [phone] 01/01/2018 8:16 AM

## 2018-01-01 NOTE — Progress Notes (Signed)
Patient transported from 4N15  To CT and back without any complications.

## 2018-01-01 NOTE — Progress Notes (Signed)
SLP Cancellation Note  Patient Details Name: Gena Frayellie A Heinzelman MRN: [id number] DOB: 1946-01-27   Cancelled treatment:       Reason Eval/Treat Not Completed: Medical issues which prohibited therapy; remains on ventilator; ST will continue efforts.   Tressie StalkerPat Maesyn Frisinger, M.S., CCC-SLP 01/01/2018, 1:30 PM

## 2018-01-01 NOTE — Progress Notes (Signed)
OT Cancellation Note  Patient Details Name: [name] [name] MRN: [id number] DOB: July 04, 1945   Cancelled Treatment:    Reason Eval/Treat Not Completed: Patient not medically ready.  Pt intubated.  Will reattempt.  Jeani HawkingWendi Macsen Nuttall, OTR/L Acute Rehabilitation Services Pager [phone] Office [phone]   Jeani HawkingConarpe, Shar Paez M 01/01/2018, 12:27 PM

## 2018-01-01 NOTE — Progress Notes (Signed)
NEUROHOSPITALISTS STROKE TEAM - DAILY PROGRESS NOTE   SUBJECTIVE Patient still intubated and off sedation. Daughter at bedside. Pt eyes closed but able to open eyes on voice and following simple commands. Eyes fixed position, RUE weakness. Not on tube feeding yet.   OBJECTIVE Most recent Vital Signs: Vitals:   01/01/18 0730 01/01/18 0745 01/01/18 0800 01/01/18 0815  BP: (!) 133/57 (!) 145/62 (!) 137/52 (!) 146/60  Pulse: 70 70 70 69  Resp: (!) 29 (!) 30 (!) 27 (!) 29  Temp:   99.1 F (37.3 C)   TempSrc:   Axillary   SpO2:   100%   Weight:      Height:       CBG (last 3)  Recent Labs    2018-01-12 1406 12-Jan-2018 2156 01/01/18 0717  GLUCAP 251* 209* 237*    Physical Exam  HEENT-intubated.   Cardiovascular- irregularly irregular heart rhythm Lungs-vented breath sounds.  Saturations within normal limits Abdomen-obese Musculoskeletal: Pulses weak in right lower extremities, only audible with Doppler and +1 in left lower extremity Skin-warm and dry.  Left groin dressing clean dry and intact no hematoma noted  Neuro:  Intubated off sedation. Eyes closed but able to open briefly on voice. PERRL, fixed middle position, not lateral gaze. Corneal positive b/l. Very weak gag and cough. Not blinking to visual threat. Facial symmetry not able to test due to ET tube. LUE 3/5 proximal and distal and following commands. LLE 3-/5 proximal and distal 3+/5. RUE flaccid. RLE 2/5 proximal and 3-/5 distally. DTR 1+ and bilateral babinski. Sensation, coordination and gait not tested.  Lipid Panel    Component Value Date/Time   CHOL 135 01/01/2018 0323   TRIG 206 (H) 01/01/2018 0323   HDL 21 (L) 01/01/2018 0323   CHOLHDL 6.4 01/01/2018 0323   VLDL 41 (H) 01/01/2018 0323   LDLCALC 73 01/01/2018 0323   LDLCALC 118 (H) 08/09/2017 0903   HgbA1C  Lab Results  Component Value Date   HGBA1C 8.4 (H) 12/30/2017    Urine Drug Screen:        Component Value Date/Time   LABOPIA NONE DETECTED 01/12/18 1747   COCAINSCRNUR NONE DETECTED 2018-01-12 1747   LABBENZ NONE DETECTED Jan 12, 2018 1747   AMPHETMU NONE DETECTED 2018/01/12 1747   THCU NONE DETECTED 12-Jan-2018 1747   LABBARB NONE DETECTED 2018/01/12 1747    Alcohol Level:  Recent Labs  Lab 2018-01-12 0450  ETH <10    Ct Angio Head/ Neck W Or Wo Contrast/ CT Perfusion  12-Jan-2018 IMPRESSION:  1. 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 Superior cerebellar arteries not visualized. PCAs are perfused at this time via collateralization from small posterior communicating arteries. 2. No acute core infarct identified by CT perfusion. 3. Approximate 50% stenosis at the origin of the right vertebral artery. Right vertebral artery dominant and otherwise widely patent to the vertebrobasilar junction. 4. Poor opacification of the distal left V2 segment, which may be occluded. Reconstitution at the V3 segment, which remains patent to the cranial vault. Multifocal moderate V4 stenoses as above. 5. Atheromatous stenoses of up to approximately 70-80% about the carotid bifurcations bilaterally. Evaluation limited by prominent calcifications  and motion artifact on this exam. 6. 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 7. 60% stenosis at the origin of the left subclavian artery. 8. Patchy multifocal consolidative opacities within the visualized lungs, concerning for pneumonia. Evidence for underlying pulmonary interstitial edema.   Dg Chest 1 View 12/16/2017 IMPRESSION: Tube positions as described without pneumothorax. Pulmonary vascular congestion. Mild perihilar pulmonary edema. No frank consolidation. Left base atelectasis noted. Postoperative changes noted. Electronically Signed   By: Bretta Bang III M.D.   On: 12/29/2017 12:16   Ct Head Wo Contrast 12/14/2017 IMPRESSION: 1. Involving nonhemorrhagic infarct  involving the left occipital lobe is described. This is consistent with acute left PCA occlusion. 2. No other acute or focal cortical or basal ganglia infarct evident. 3. Moderate diffuse white matter disease. 4. Anterior ethmoid and frontal sinus disease may be related to intubation.  EKG :A. fib /a flutter  Ct Head Wo Contrast  Result Date: 01/01/2018 CLINICAL DATA:  72 y/o  F; stroke for follow-up. EXAM: CT HEAD WITHOUT CONTRAST TECHNIQUE: Contiguous axial images were obtained from the base of the skull through the vertex without intravenous contrast. COMPARISON:  12/26/2017 CT head, CTA head, CT perfusion head. FINDINGS: Brain: Subacute infarct involving the left occipital lobe, left SCA distribution of cerebellum, and right thalamus with mild edema and local mass effect. No associated hemorrhage. No additional focus of stroke, hemorrhage, or focal mass effect identified. No extra-axial collection, hydrocephalus, or effacement of basilar cisterns. Stable background of chronic microvascular ischemic changes and volume loss of the brain. Vascular: Tip of basilar to right PCA stent in situ. Skull: Normal. Negative for fracture or focal lesion. Sinuses/Orbits: No acute finding. Other: Bilateral intra-ocular lens replacement. IMPRESSION: Subacute infarct involving left occipital lobe, left SCA distribution of cerebellum, and right thalamus. Mild increased edema and local mass effect. No herniation or associated hemorrhage. Electronically Signed   By: Mitzi Hansen M.D.   On: 01/01/2018 02:36   Dg Chest Port 1 View  Result Date: 01/01/2018 CLINICAL DATA:  Respiratory failure EXAM: PORTABLE CHEST 1 VIEW COMPARISON:  12/17/2017 FINDINGS: Endotracheal tube terminates 3.5 cm above the carina. Mild perihilar edema, right lower lobe predominant. Less likely, multifocal right lung infection is possible. No definite pleural effusions. No pneumothorax. Heart is top-normal in size. Postsurgical changes  related to prior CABG. Left subclavian ICD. Enteric tube courses below the diaphragm. IMPRESSION: Endotracheal tube terminates 3.5 cm above the carina. Suspected mild perihilar edema, right lower lobe predominant. Less likely, multifocal right lung infection is possible. Postoperative changes with additional support apparatus as above. Electronically Signed   By: Charline Bills M.D.   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   CTA head & neck : Positive CTA for evolving LVO with occlusive thrombus within the distal basilar artery. Superior cerebellar arteries not visualized. Approximate 50% stenosis at the origin of the right vertebral artery,  Atheromatous stenoses of up to approximately 70-80% about the carotid bifurcations bilaterally. 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   MRI of the brain : not performed due to pacemaker  Repeat Head CT 9/20: Evolving Left PCA nonhemorrhagic infarct.   Repeat CT head 9/21 - left PCA, left SCA and right thalamic infarcts  2D Echocardiogram : Pending  CXR : Pulmonary vascular congestion. Mild perihilar pulmonary edema. No frank  consolidation.  LDL 118  HgbA1c 8.4  VTE: heparin subq  Antiplatelets : Aspirin 81 mg daily/Brilinta 90 mg twice daily  Continue Rehab with PT consult, OT consult, Speech consult  Therapy recommendations:   Pending  Disposition:   Pending   Atrial Fibrillation  Home anticoagulation:   Eliquis  Home medications, Betapace   CHA2DS2-VASc Score = 6             Age in Years:  ?[age]                         Sex:  Female   Female                      Hypertension History:  yes                          Diabetes Mellitus:  0             Congestive Heart Failure History:  0             Vascular Disease  History:  yes                             Stroke/TIA/Thromboembolism History:  yes     Meds: Coreg  Due to multifocal infarcts with moderate size on CT, will plan to resume eliquis in 7 days post stroke.    Respiratory failure  Intubated off sedation  CCM on board  Wean off vent as able  Hypertension urgency  Stable off cleviprex  BP goal < 160 post procedure  Home medications: Enalapril, Coreg  Hyperlipidemia  Home meds:   None  LDL 118, goal < 70  on lipitor 80mg  daily  Continue statin at discharge   Uncontrolled diabetes mellitus  A1C 8.4, goal < 7.0  SSI  CBG monitoring  Hyperglycemia  Put on lantus 20  DM coordinator consult pending  CHF/CAD s/p CABG  Currently on aspirin and Brilinta post cardiac  Stent  Continue coreg and home lasix  Avoid fluid overload  Other Stroke Risk Factors  Advanced age  Former smoker  PVD   Other Active Problems  GERD-continue PPI  Hypothyroidism-continue Synthroid   Hospital day # 1  This patient is critically ill due to posterior infarcts, dysphagia, respiratory failure, BA occlusion s/p IR and at significant risk of neurological worsening, death form recurrent strokes, hemorrhagic conversion, cerebral edema, dysphagia, aspiration, seizure. This patient's care requires constant monitoring of vital signs, hemodynamics, respiratory and cardiac monitoring, review of multiple databases, neurological assessment, discussion with family, other specialists and medical decision making of high complexity. I spent 45 minutes of neurocritical care time in the care of this patient. I had long discussion with daughter and granddaughter at bedside, updated pt current condition, treatment plan and potential prognosis. They expressed understanding and appreciation.   Marvel PlanJindong Dejia Ebron, MD PhD Stroke Neurology 01/01/2018 11:48 AM    To contact Stroke Continuity provider, please refer to [email or web address]. After hours, contact General  Neurology

## 2018-01-01 NOTE — Progress Notes (Signed)
eLink Physician-Brief Progress Note Patient Name: [name] [name] DOB: Feb 05, 1946 MRN: [id number]   Date of Service  01/01/2018  HPI/Events of Note  Patient is s/p neuro-intervention for basilar CVA with current MAP of 63 mmHg despite fluid boluses  eICU Interventions  Phenylephrine infusion targeting MAP > 65 mmHg and SBP > 100 mmHg        Youa Deloney U Videl Nobrega 01/01/2018, 12:59 AM

## 2018-01-01 NOTE — Progress Notes (Addendum)
142 Na level called in to Dr Amada JupiterKirkpatrick with Neurology. Advised to leave 3%NaCl at 6825ml/hr. RN will continue to monitor

## 2018-01-01 NOTE — Progress Notes (Signed)
Referring Physician(s): Xu,J  Supervising Physician: Julieanne Cotton  Patient Status:  [facility] - In-pt  Chief Complaint:  stroke  Subjective: Pt intubated; will occ open eyes to voice; will give thumbs up when prompted; not blinking to visual threat   Allergies: Rosiglitazone; Tapentadol; Tape; and Celecoxib  Medications: Prior to Admission medications   Medication Sig Start Date End Date Taking? Authorizing Provider  ALPRAZolam (XANAX) 1 MG tablet TAKE ONE-HALF TO ONE TABLET BY MOUTH TWOTIMES A DAY AS NEEDED FOR ANXIETY Patient taking differently: Take 0.5 mg by mouth 2 (two) times daily as needed. TAKE ONE-HALF TO ONE TABLET BY MOUTH TWOTIMES A DAY AS NEEDED FOR ANXIETY 10/07/17  Yes Campbell Riches, NP  apixaban (ELIQUIS) 5 MG TABS tablet Take 1 tablet (5 mg total) by mouth 2 (two) times daily. 11/05/16  Yes Marinus Maw, MD  carvedilol (COREG) 25 MG tablet TAKE ONE TABLET TWICE DAILY WITH A MEAL Patient taking differently: Take 12.5 mg by mouth 2 (two) times daily with a meal. TAKE ONE TABLET TWICE DAILY WITH A MEAL 11/05/16  Yes Marinus Maw, MD  Cholecalciferol (VITAMIN D3) 5000 units CAPS Take 1 capsule (5,000 Units total) by mouth daily. 08/19/17  Yes Roma Kayser, MD  enalapril (VASOTEC) 10 MG tablet Take 1 tablet (10 mg total) by mouth 2 (two) times daily. Patient taking differently: Take 10 mg by mouth daily.  11/05/16  Yes Marinus Maw, MD  furosemide (LASIX) 40 MG tablet Take 1 tablet (40 mg total) by mouth 2 (two) times daily. 11/05/16  Yes Marinus Maw, MD  insulin lispro (HUMALOG KWIKPEN) 100 UNIT/ML KiwkPen Inject 0.24-0.3 mLs (24-30 Units total) into the skin 3 (three) times daily. 08/19/17  Yes Nida, Denman George, MD  LEVEMIR FLEXTOUCH 100 UNIT/ML Pen INJECT 80 UNITS INTO THE SKIN DAILY AT 10:00PM Patient taking differently: Inject 80 Units into the skin at bedtime.  08/11/17  Yes Nida, Denman George, MD  multivitamin [facility]) per tablet  Take 1 tablet by mouth daily.     Yes [provider]  Omega-3 Fatty Acids (FISH OIL) 1000 MG CAPS Take 1,000 mg by mouth daily.   Yes [provider]  potassium chloride SA (K-DUR,KLOR-CON) 20 MEQ tablet Take 1 tablet (20 mEq total) by mouth 2 (two) times daily. 06/10/17  Yes Jonelle Sidle, MD  SYNTHROID 125 MCG tablet TAKE ONE (1) TABLET BY MOUTH EVERY DAY Patient taking differently: Take 125 mcg by mouth daily before breakfast.  10/07/17  Yes Nida, Denman George, MD  traMADol (ULTRAM) 50 MG tablet TAKE ONE TABLET ONCE DAILY FOR MODERATE PAIN Patient taking differently: Take 50 mg by mouth as needed for moderate pain. TAKE ONE TABLET ONCE DAILY FOR MODERATE PAIN 06/11/17  Yes Merlyn Albert, MD  VICTOZA 18 MG/3ML SOPN INJECT 0.3ML'S (1.8MG  TOTAL) INTO THE SKIN ONCE DAILY Patient taking differently: Inject 1.8 mg into the skin daily.  07/12/17  Yes Roma Kayser, MD  vitamin E 600 UNIT capsule Take 600 Units by mouth daily.     Yes [provider]  ACCU-CHEK AVIVA PLUS test strip USE TO TEST GLUCOSE 4 TIMES A DAY 12/27/17   Roma Kayser, MD  Blood Glucose Monitoring Suppl (ACCU-CHEK AVIVA) device Use as instructed 05/06/15   Roma Kayser, MD  Continuous Blood Gluc Sensor (FREESTYLE LIBRE SENSOR SYSTEM) MISC Use one sensor every 10 days. 12/24/16   Roma Kayser, MD  glucose blood test strip Accu-Chek  Aviva Plus test strips    [provider]  Insulin Pen Needle (B-D ULTRAFINE III SHORT PEN) 31G X 8 MM MISC Use 5 x daily as directed 09/02/16   Roma Kayser, MD  OVER THE COUNTER MEDICATION Take 1 tablet by mouth daily. Phyomega vitamin.    [provider]     Vital Signs: BP (!) 148/58   Pulse 70   Temp 99 F (37.2 C) (Axillary)   Resp (!) 28   Ht 5\' 1"  (1.549 m)   Wt 247 lb 9.2 oz (112.3 kg)   LMP  (LMP Unknown)   SpO2 100%   BMI 46.78 kg/m   Physical Exam intubated; responds some to voice/FC;  pupils 3 mm bilat;  absent rt hand grip, withdraws to pain;  left side with purposeful movement; rt radial/left CFA access sites ok, no discrete hematomas; some rt arm edema  Imaging: Ct Angio Head W Or Wo Contrast  Result Date: 2018-01-11 CLINICAL DATA:  Initial evaluation for right-sided neglect, slurred speech. EXAM: CT ANGIOGRAPHY HEAD AND NECK CT PERFUSION BRAIN TECHNIQUE: Multidetector CT imaging of the head and neck was performed using the standard protocol during bolus administration of intravenous contrast. Multiplanar CT image reconstructions and MIPs were obtained to evaluate the vascular anatomy. Carotid stenosis measurements (when applicable) are obtained utilizing NASCET criteria, using the distal internal carotid diameter as the denominator. Multiphase CT imaging of the brain was performed following IV bolus contrast injection. Subsequent parametric perfusion maps were calculated using RAPID software. CONTRAST:  ISOVUE-370 IOPAMIDOL (ISOVUE-370) INJECTION 76% COMPARISON:  None available at time of this dictation. FINDINGS: CTA NECK FINDINGS Aortic arch: Examination technically limited by motion artifact. Visualized aortic arch of normal caliber with normal branch pattern. Moderate atherosclerotic change about the aortic arch and origin of the great vessels. Approximate 60% stenosis at the origin of the left subclavian artery. No other flow-limiting stenosis about the origin of the great vessels. Visualized subclavian arteries otherwise grossly patent. Right carotid system: Focal plaque at the origin of the right common carotid artery with associated 60% stenosis. Right common carotid artery otherwise patent to the bifurcation without flow-limiting stenosis. Extensive plaque about the right bifurcation/proximal right ICA. Resultant fairly severe stenosis of up to approximately 70-80%, although exact termination difficult given prominent calcifications and motion artifact within this region.  Right ICA patent distally to the skull base. Left carotid system: Left common carotid artery patent from its origin to the bifurcation without stenosis. Prominent calcification about the left bifurcation/proximal left ICA with associated stenosis of up to approximately 75% by NASCET criteria, although again, evaluation limited. Left ICA patent distally to the skull base without stenosis, dissection or occlusion. Vertebral arteries: Both of the vertebral arteries arise from the subclavian arteries. Right vertebral artery dominant. Focal plaque at the origin of the right vertebral artery with suspected approximate 50% stenosis. Right vertebral artery otherwise patent to the skull base. Left vertebral artery somewhat diminutive. Left V1 and V2 segments patent proximally. There is poor opacification of the left V2 segment at the level of V3 (series 7, image 193), which may be occluded. Contrast opacification is seen distally at the left V3 segment, with the left vert appearing patent as it courses into the cranial vault. Skeleton: No acute osseus abnormality. No discrete osseous lesions. Moderate cervical spondylolysis at C5-6 and C6-7. Other neck: No acute soft tissue abnormality within the neck. Upper chest: Left-sided pacemaker/AICD in place. Sequelae of prior CABG. Cardiomegaly partially visualized. Patchy multifocal  consolidative opacities within the visualized lungs, concerning for pneumonia. Underlying pulmonary interstitial edema. Enlarged subcarinal nodal conglomerate measures 2.2 cm, which may be reactive. Review of the MIP images confirms the above findings CTA HEAD FINDINGS Anterior circulation: Petrous segments patent bilaterally. Extensive calcified plaque throughout the carotid siphons with moderate diffuse narrowing, up to approximately 50%. ICA termini patent. A1 segments patent bilaterally. Left A1 hypoplastic, likely accounting for the diminutive left ICA is compared to the right. Normal anterior  communicating artery. Anterior cerebral arteries patent to their distal aspects without stenosis. Left M1 patent without stenosis. Normal left MCA bifurcation. No proximal left M2 occlusion. Distal left MCA branches well perfused. Right M1 widely patent. Normal right MCA bifurcation. No proximal right M2 occlusion. Distal right MCA branches well perfused. Posterior circulation: Scattered non stenotic plaque within the right V4 segment. Right vertebral patent to the vertebrobasilar junction without stenosis. Focal approximate 60% stenosis within the left V4 segment as it penetrates the dural reflection. Moderate multifocal stenoses seen distally within the left V4 segment. Posterior inferior cerebral arteries patent bilaterally. Basilar patent proximally. There is occlusion distally (series 7, image 112), which remains occluded to the basilar tip. Superior cerebellar arteries not seen. There is perfusion of the PCAs bilateral, likely via small posterior communicating arteries. Severe proximal right P2 stenosis (series 10, image 21). Multifocal atheromatous irregularity seen throughout the PCAs distally. Venous sinuses: Patent. Anatomic variants: Small bilateral posterior communicating arteries. Dominant right vertebral artery. Delayed phase: Not performed. Review of the MIP images confirms the above findings CT Brain Perfusion Findings: CBF (<30%) Volume: 0mL Perfusion (Tmax>6.0s) volume: Mismatch Volume: Infinite. Infarction Location:Negative infarct by CT perfusion. 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 IMPRESSION: 1. 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 Superior cerebellar arteries not visualized. PCAs are perfused at this time via collateralization from small posterior communicating arteries. 2. No acute core infarct identified by CT perfusion. 3. Approximate 50% stenosis at the  origin of the right vertebral artery. Right vertebral artery dominant and otherwise widely patent to the vertebrobasilar junction. 4. Poor opacification of the distal left V2 segment, which may be occluded. Reconstitution at the V3 segment, which remains patent to the cranial vault. Multifocal moderate V4 stenoses as above. 5. Atheromatous stenoses of up to approximately 70-80% about the carotid bifurcations bilaterally. Evaluation limited by prominent calcifications and motion artifact on this exam. 6. 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 7. 60% stenosis at the origin of the left subclavian artery. 8. Patchy multifocal consolidative opacities within the visualized lungs, concerning for pneumonia. Evidence for underlying pulmonary interstitial edema. Critical Value/emergent results were called by telephone at the time of interpretation on 01/22/18 at 5:26 am to Dr. Jaci Carrel , who verbally acknowledged these results. Electronically Signed   By: Rise Mu M.D.   On: 01-22-18 05:47   Dg Chest 1 View  Result Date: 2018/01/22 CLINICAL DATA:  CVA.  Hypoxia. EXAM: CHEST  1 VIEW COMPARISON:  June 22, 2016 FINDINGS: Endotracheal tube tip is 2.8 cm above the carina. Nasogastric tube tip and side port are below the diaphragm. Pacemaker leads are attached the right atrium, right ventricle, and coronary sinus. No pneumothorax. : There is atelectatic change in the left base. There is hazy opacity in the perihilar regions, likely representing localized edema. Lungs elsewhere clear. There is cardiomegaly with pulmonary venous hypertension. Patient is status post coronary artery bypass grafting. No adenopathy.  No bone lesions. IMPRESSION: Tube positions as described without pneumothorax. Pulmonary vascular congestion. Mild perihilar pulmonary edema. No frank consolidation. Left base atelectasis noted. Postoperative changes noted. Electronically Signed    By: Bretta Bang III M.D.   On: 01-04-2018 12:16   Ct Head Wo Contrast  Result Date: 01/01/2018 CLINICAL DATA:  72 y/o  F; stroke for follow-up. EXAM: CT HEAD WITHOUT CONTRAST TECHNIQUE: Contiguous axial images were obtained from the base of the skull through the vertex without intravenous contrast. COMPARISON:  2018/01/04 CT head, CTA head, CT perfusion head. FINDINGS: Brain: Subacute infarct involving the left occipital lobe, left SCA distribution of cerebellum, and right thalamus with mild edema and local mass effect. No associated hemorrhage. No additional focus of stroke, hemorrhage, or focal mass effect identified. No extra-axial collection, hydrocephalus, or effacement of basilar cisterns. Stable background of chronic microvascular ischemic changes and volume loss of the brain. Vascular: Tip of basilar to right PCA stent in situ. Skull: Normal. Negative for fracture or focal lesion. Sinuses/Orbits: No acute finding. Other: Bilateral intra-ocular lens replacement. IMPRESSION: Subacute infarct involving left occipital lobe, left SCA distribution of cerebellum, and right thalamus. Mild increased edema and local mass effect. No herniation or associated hemorrhage. Electronically Signed   By: Mitzi Hansen M.D.   On: 01/01/2018 02:36   Ct Head Wo Contrast  Result Date: January 04, 2018 CLINICAL DATA:  Status post revascularization of distal basilar occlusion with rescue stent placed the mid basilar artery into the right posterior cerebral artery. The left posterior cerebral artery remains occluded. EXAM: CT HEAD WITHOUT CONTRAST TECHNIQUE: Contiguous axial images were obtained from the base of the skull through the vertex without intravenous contrast. COMPARISON:  CT perfusion and CT angiogram of the same day. FINDINGS: Brain: There is loss of gray-white differentiation at the left occipital pole and along the medial left occipital lobe. Inferior left occipital lobe infarct is noted. No  associated hemorrhage is present. Although there is white matter disease on the right, no definite cortical infarct is present in the right occipital lobe. Basal ganglia are intact. Insular ribbon is normal bilaterally. Basilar artery stent extending into the right PCA is noted. There is no hyperdense vessel in the posterior circulation. Ventricles are proportionate to the degree of atrophy. No significant extra-axial fluid collection is present. A cavum septum pellucidum is incidentally noted. Vascular: Basilar artery stent in place. Atherosclerotic calcifications are present in the cavernous internal carotid arteries and at the dural margin of both vertebral arteries without significant stenosis. Skull: The skull base is within normal limits. No focal lytic or blastic lesions are present. Sinuses/Orbits: Mucosal thickening is present in the anterior ethmoid air cells with minimal fluid in the frontal sinuses bilaterally. The remaining paranasal sinuses are clear. The mastoid air cells are clear. The patient is intubated. Bilateral lens replacements are present. Globes and orbits are otherwise within normal limits. IMPRESSION: 1. Involving nonhemorrhagic infarct involving the left occipital lobe is described. This is consistent with acute left PCA occlusion. 2. No other acute or focal cortical or basal ganglia infarct evident. 3. Moderate diffuse white matter disease. 4. Anterior ethmoid and frontal sinus disease may be related to intubation. Electronically Signed   By: Marin Roberts M.D.   On: 2018-01-04 12:22   Ct Angio Neck W And/or Wo Contrast  Result Date: 01-04-2018 CLINICAL DATA:  Initial evaluation for right-sided neglect, slurred speech. EXAM: CT ANGIOGRAPHY HEAD AND NECK CT PERFUSION BRAIN TECHNIQUE: Multidetector CT imaging of the head and neck was  performed using the standard protocol during bolus administration of intravenous contrast. Multiplanar CT image reconstructions and MIPs were  obtained to evaluate the vascular anatomy. Carotid stenosis measurements (when applicable) are obtained utilizing NASCET criteria, using the distal internal carotid diameter as the denominator. Multiphase CT imaging of the brain was performed following IV bolus contrast injection. Subsequent parametric perfusion maps were calculated using RAPID software. CONTRAST:  ISOVUE-370 IOPAMIDOL (ISOVUE-370) INJECTION 76% COMPARISON:  None available at time of this dictation. FINDINGS: CTA NECK FINDINGS Aortic arch: Examination technically limited by motion artifact. Visualized aortic arch of normal caliber with normal branch pattern. Moderate atherosclerotic change about the aortic arch and origin of the great vessels. Approximate 60% stenosis at the origin of the left subclavian artery. No other flow-limiting stenosis about the origin of the great vessels. Visualized subclavian arteries otherwise grossly patent. Right carotid system: Focal plaque at the origin of the right common carotid artery with associated 60% stenosis. Right common carotid artery otherwise patent to the bifurcation without flow-limiting stenosis. Extensive plaque about the right bifurcation/proximal right ICA. Resultant fairly severe stenosis of up to approximately 70-80%, although exact termination difficult given prominent calcifications and motion artifact within this region. Right ICA patent distally to the skull base. Left carotid system: Left common carotid artery patent from its origin to the bifurcation without stenosis. Prominent calcification about the left bifurcation/proximal left ICA with associated stenosis of up to approximately 75% by NASCET criteria, although again, evaluation limited. Left ICA patent distally to the skull base without stenosis, dissection or occlusion. Vertebral arteries: Both of the vertebral arteries arise from the subclavian arteries. Right vertebral artery dominant. Focal plaque at the origin of the right  vertebral artery with suspected approximate 50% stenosis. Right vertebral artery otherwise patent to the skull base. Left vertebral artery somewhat diminutive. Left V1 and V2 segments patent proximally. There is poor opacification of the left V2 segment at the level of V3 (series 7, image 193), which may be occluded. Contrast opacification is seen distally at the left V3 segment, with the left vert appearing patent as it courses into the cranial vault. Skeleton: No acute osseus abnormality. No discrete osseous lesions. Moderate cervical spondylolysis at C5-6 and C6-7. Other neck: No acute soft tissue abnormality within the neck. Upper chest: Left-sided pacemaker/AICD in place. Sequelae of prior CABG. Cardiomegaly partially visualized. Patchy multifocal consolidative opacities within the visualized lungs, concerning for pneumonia. Underlying pulmonary interstitial edema. Enlarged subcarinal nodal conglomerate measures 2.2 cm, which may be reactive. Review of the MIP images confirms the above findings CTA HEAD FINDINGS Anterior circulation: Petrous segments patent bilaterally. Extensive calcified plaque throughout the carotid siphons with moderate diffuse narrowing, up to approximately 50%. ICA termini patent. A1 segments patent bilaterally. Left A1 hypoplastic, likely accounting for the diminutive left ICA is compared to the right. Normal anterior communicating artery. Anterior cerebral arteries patent to their distal aspects without stenosis. Left M1 patent without stenosis. Normal left MCA bifurcation. No proximal left M2 occlusion. Distal left MCA branches well perfused. Right M1 widely patent. Normal right MCA bifurcation. No proximal right M2 occlusion. Distal right MCA branches well perfused. Posterior circulation: Scattered non stenotic plaque within the right V4 segment. Right vertebral patent to the vertebrobasilar junction without stenosis. Focal approximate 60% stenosis within the left V4 segment as it  penetrates the dural reflection. Moderate multifocal stenoses seen distally within the left V4 segment. Posterior inferior cerebral arteries patent bilaterally. Basilar patent proximally. There is occlusion distally (series 7, image 112),  which remains occluded to the basilar tip. Superior cerebellar arteries not seen. There is perfusion of the PCAs bilateral, likely via small posterior communicating arteries. Severe proximal right P2 stenosis (series 10, image 21). Multifocal atheromatous irregularity seen throughout the PCAs distally. Venous sinuses: Patent. Anatomic variants: Small bilateral posterior communicating arteries. Dominant right vertebral artery. Delayed phase: Not performed. Review of the MIP images confirms the above findings CT Brain Perfusion Findings: CBF (<30%) Volume: 0mL Perfusion (Tmax>6.0s) volume: Mismatch Volume: Infinite. Infarction Location:Negative infarct by CT perfusion. 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 IMPRESSION: 1. 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 Superior cerebellar arteries not visualized. PCAs are perfused at this time via collateralization from small posterior communicating arteries. 2. No acute core infarct identified by CT perfusion. 3. Approximate 50% stenosis at the origin of the right vertebral artery. Right vertebral artery dominant and otherwise widely patent to the vertebrobasilar junction. 4. Poor opacification of the distal left V2 segment, which may be occluded. Reconstitution at the V3 segment, which remains patent to the cranial vault. Multifocal moderate V4 stenoses as above. 5. Atheromatous stenoses of up to approximately 70-80% about the carotid bifurcations bilaterally. Evaluation limited by prominent calcifications and motion artifact on this exam. 6. Extensive carotid siphon atherosclerosis with moderate diffuse narrowing  bilaterally. 7. 60% stenosis at the origin of the left subclavian artery. 8. Patchy multifocal consolidative opacities within the visualized lungs, concerning for pneumonia. Evidence for underlying pulmonary interstitial edema. Critical Value/emergent results were called by telephone at the time of interpretation on 01/07/2018 at 5:26 am to Dr. Jaci Carrel , who verbally acknowledged these results. Electronically Signed   By: Rise Mu M.D.   On: 12/25/2017 05:47   Ct Cerebral Perfusion W Contrast  Result Date: 12/26/2017 CLINICAL DATA:  Initial evaluation for right-sided neglect, slurred speech. EXAM: CT ANGIOGRAPHY HEAD AND NECK CT PERFUSION BRAIN TECHNIQUE: Multidetector CT imaging of the head and neck was performed using the standard protocol during bolus administration of intravenous contrast. Multiplanar CT image reconstructions and MIPs were obtained to evaluate the vascular anatomy. Carotid stenosis measurements (when applicable) are obtained utilizing NASCET criteria, using the distal internal carotid diameter as the denominator. Multiphase CT imaging of the brain was performed following IV bolus contrast injection. Subsequent parametric perfusion maps were calculated using RAPID software. CONTRAST:  ISOVUE-370 IOPAMIDOL (ISOVUE-370) INJECTION 76% COMPARISON:  None available at time of this dictation. FINDINGS: CTA NECK FINDINGS Aortic arch: Examination technically limited by motion artifact. Visualized aortic arch of normal caliber with normal branch pattern. Moderate atherosclerotic change about the aortic arch and origin of the great vessels. Approximate 60% stenosis at the origin of the left subclavian artery. No other flow-limiting stenosis about the origin of the great vessels. Visualized subclavian arteries otherwise grossly patent. Right carotid system: Focal plaque at the origin of the right common carotid artery with associated 60% stenosis. Right common carotid artery  otherwise patent to the bifurcation without flow-limiting stenosis. Extensive plaque about the right bifurcation/proximal right ICA. Resultant fairly severe stenosis of up to approximately 70-80%, although exact termination difficult given prominent calcifications and motion artifact within this region. Right ICA patent distally to the skull base. Left carotid system: Left common carotid artery patent from its origin to the bifurcation without stenosis. Prominent calcification about the left bifurcation/proximal left ICA with associated stenosis of up to approximately 75% by NASCET criteria, although again, evaluation limited. Left ICA patent  distally to the skull base without stenosis, dissection or occlusion. Vertebral arteries: Both of the vertebral arteries arise from the subclavian arteries. Right vertebral artery dominant. Focal plaque at the origin of the right vertebral artery with suspected approximate 50% stenosis. Right vertebral artery otherwise patent to the skull base. Left vertebral artery somewhat diminutive. Left V1 and V2 segments patent proximally. There is poor opacification of the left V2 segment at the level of V3 (series 7, image 193), which may be occluded. Contrast opacification is seen distally at the left V3 segment, with the left vert appearing patent as it courses into the cranial vault. Skeleton: No acute osseus abnormality. No discrete osseous lesions. Moderate cervical spondylolysis at C5-6 and C6-7. Other neck: No acute soft tissue abnormality within the neck. Upper chest: Left-sided pacemaker/AICD in place. Sequelae of prior CABG. Cardiomegaly partially visualized. Patchy multifocal consolidative opacities within the visualized lungs, concerning for pneumonia. Underlying pulmonary interstitial edema. Enlarged subcarinal nodal conglomerate measures 2.2 cm, which may be reactive. Review of the MIP images confirms the above findings CTA HEAD FINDINGS Anterior circulation: Petrous  segments patent bilaterally. Extensive calcified plaque throughout the carotid siphons with moderate diffuse narrowing, up to approximately 50%. ICA termini patent. A1 segments patent bilaterally. Left A1 hypoplastic, likely accounting for the diminutive left ICA is compared to the right. Normal anterior communicating artery. Anterior cerebral arteries patent to their distal aspects without stenosis. Left M1 patent without stenosis. Normal left MCA bifurcation. No proximal left M2 occlusion. Distal left MCA branches well perfused. Right M1 widely patent. Normal right MCA bifurcation. No proximal right M2 occlusion. Distal right MCA branches well perfused. Posterior circulation: Scattered non stenotic plaque within the right V4 segment. Right vertebral patent to the vertebrobasilar junction without stenosis. Focal approximate 60% stenosis within the left V4 segment as it penetrates the dural reflection. Moderate multifocal stenoses seen distally within the left V4 segment. Posterior inferior cerebral arteries patent bilaterally. Basilar patent proximally. There is occlusion distally (series 7, image 112), which remains occluded to the basilar tip. Superior cerebellar arteries not seen. There is perfusion of the PCAs bilateral, likely via small posterior communicating arteries. Severe proximal right P2 stenosis (series 10, image 21). Multifocal atheromatous irregularity seen throughout the PCAs distally. Venous sinuses: Patent. Anatomic variants: Small bilateral posterior communicating arteries. Dominant right vertebral artery. Delayed phase: Not performed. Review of the MIP images confirms the above findings CT Brain Perfusion Findings: CBF (<30%) Volume: 0mL Perfusion (Tmax>6.0s) volume: Mismatch Volume: Infinite. Infarction Location:Negative infarct by CT perfusion. 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 IMPRESSION: 1. Positive  CTA for EVLO with occlusive thrombus within the distal basilar artery. Superior cerebellar arteries not visualized. PCAs are perfused at this time via collateralization from small posterior communicating arteries. 2. No acute core infarct identified by CT perfusion. 3. Approximate 50% stenosis at the origin of the right vertebral artery. Right vertebral artery dominant and otherwise widely patent to the vertebrobasilar junction. 4. Poor opacification of the distal left V2 segment, which may be occluded. Reconstitution at the V3 segment, which remains patent to the cranial vault. Multifocal moderate V4 stenoses as above. 5. Atheromatous stenoses of up to approximately 70-80% about the carotid bifurcations bilaterally. Evaluation limited by prominent calcifications and motion artifact on this exam. 6. 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 7. 60% stenosis at the origin of the left subclavian artery. 8. Patchy multifocal consolidative opacities within the visualized lungs, concerning for pneumonia.  Evidence for underlying pulmonary interstitial edema. Critical Value/emergent results were called by telephone at the time of interpretation on 12/20/2017 at 5:26 am to Dr. Jaci Carrel , who verbally acknowledged these results. Electronically Signed   By: Rise Mu M.D.   On: 01/05/2018 05:47   Dg Chest Port 1 View  Result Date: 01/01/2018 CLINICAL DATA:  Respiratory failure EXAM: PORTABLE CHEST 1 VIEW COMPARISON:  01/09/2018 FINDINGS: Endotracheal tube terminates 3.5 cm above the carina. Mild perihilar edema, right lower lobe predominant. Less likely, multifocal right lung infection is possible. No definite pleural effusions. No pneumothorax. Heart is top-normal in size. Postsurgical changes related to prior CABG. Left subclavian ICD. Enteric tube courses below the diaphragm. IMPRESSION: Endotracheal tube terminates 3.5 cm above the carina. Suspected mild  perihilar edema, right lower lobe predominant. Less likely, multifocal right lung infection is possible. Postoperative changes with additional support apparatus as above. Electronically Signed   By: Charline Bills M.D.   On: 01/01/2018 07:07   [place] Ekg Site Rite  Result Date: 01/01/2018 If Site Rite image not attached, placement could not be confirmed due to current cardiac rhythm.  [place] Ekg Site Rite  Result Date: 01/01/2018 If Site Rite image not attached, placement could not be confirmed due to current cardiac rhythm.   Labs:  CBC: Recent Labs    12/28/2017 0450 01/09/2018 0453 01/01/18 0323  WBC 11.6*  --  10.8*  HGB 14.2 14.6 10.4*  HCT 44.0 43.0 34.0*  PLT 378  --  310    COAGS: Recent Labs    12/28/2017 0450 01/01/18 0323  INR 1.12 1.25  APTT 31  --     BMP: Recent Labs    08/09/17 0903 12/30/17 1004 12/15/2017 0450 01/05/2018 0453 01/01/18 0323  NA 140 139 137 138 139  K 4.7 4.8 4.7 4.7 3.6  CL 102 104 103 105 111  CO2 32 28 26  --  18*  GLUCOSE 78 170* 286* 281* 210*  BUN 15 17 20 22 13   CALCIUM 10.0 9.6 9.5  --  7.8*  CREATININE 0.85 0.88 0.87 0.80 0.78  GFRNONAA 69 67 >60  --  >60  GFRAA 80 77 >60  --  >60    LIVER FUNCTION TESTS: Recent Labs    08/09/17 0903 12/30/17 1004 01/04/2018 0450  BILITOT 0.8 0.8 0.8  AST 18 18 20   ALT 11 14 17   ALKPHOS  --   --  50  PROT 7.5 7.7 8.7*  ALBUMIN  --   --  4.3    Assessment and Plan: Pt with hx posterior circulation infarcts including left PCA, 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 Placement of rescue stent across RT PCA ,Bilateral pcoms 9/20; CT head today with  subacute infarct involving left occipital lobe, left  SCA distribution of cerebellum, and right thalamus. Mild increased edema and local mass effect. No herniation or associated hemorrhage; on brilinta, asa;  creat nl; hgb 10.4, WBC 10.8; plans as per neurology  Electronically Signed: D. Jeananne Rama, PA-C 01/01/2018, 3:48 PM   I spent a total of 15 minutes at the the patient's bedside AND on the patient's hospital floor or unit, greater than 50% of which was counseling/coordinating care for cerebral arteriogram  with endovascular intervention    Patient ID: [name] [name], female   DOB: 1945/05/07, 72 y.o.   MRN: [id number]

## 2018-01-02 ENCOUNTER — Inpatient Hospital Stay (HOSPITAL_COMMUNITY): Payer: Medicare Other

## 2018-01-02 DIAGNOSIS — I161 Hypertensive emergency: Secondary | ICD-10-CM

## 2018-01-02 DIAGNOSIS — E1165 Type 2 diabetes mellitus with hyperglycemia: Secondary | ICD-10-CM

## 2018-01-02 DIAGNOSIS — R1312 Dysphagia, oropharyngeal phase: Secondary | ICD-10-CM

## 2018-01-02 LAB — BASIC METABOLIC PANEL
Anion gap: 9 (ref 5–15)
BUN: 12 mg/dL (ref 8–23)
CHLORIDE: 114 mmol/L — AB (ref 98–111)
CO2: 18 mmol/L — AB (ref 22–32)
Calcium: 8 mg/dL — ABNORMAL LOW (ref 8.9–10.3)
Creatinine, Ser: 0.77 mg/dL (ref 0.44–1.00)
GFR calc Af Amer: >60 mL/min (ref >60)
GFR calc non Af Amer: >60 mL/min (ref >60)
GLUCOSE: 252 mg/dL — AB (ref 70–99)
POTASSIUM: 4.3 mmol/L (ref 3.5–5.1)
SODIUM: 141 mmol/L (ref 135–145)

## 2018-01-02 LAB — CBC
HCT: 35.7 % — ABNORMAL LOW (ref 36.0–46.0)
HEMOGLOBIN: 10.6 g/dL — AB (ref 12.0–15.0)
MCH: 29.8 pg (ref 26.0–34.0)
MCHC: 29.7 g/dL — AB (ref 30.0–36.0)
MCV: 100.3 fL — ABNORMAL HIGH (ref 78.0–100.0)
Platelets: 265 10*3/uL (ref 150–400)
RBC: 3.56 MIL/uL — AB (ref 3.87–5.11)
RDW: 16.5 % — ABNORMAL HIGH (ref 11.5–15.5)
WBC: 10 10*3/uL (ref 4.0–10.5)

## 2018-01-02 LAB — MAGNESIUM
MAGNESIUM: 1.9 mg/dL (ref 1.7–2.4)
MAGNESIUM: 2 mg/dL (ref 1.7–2.4)
Magnesium: 2.2 mg/dL (ref 1.7–2.4)

## 2018-01-02 LAB — PROCALCITONIN: Procalcitonin: <0.1 ng/mL

## 2018-01-02 LAB — GLUCOSE, CAPILLARY
GLUCOSE-CAPILLARY: 228 mg/dL — AB (ref 70–99)
GLUCOSE-CAPILLARY: 236 mg/dL — AB (ref 70–99)
Glucose-Capillary: 196 mg/dL — ABNORMAL HIGH (ref 70–99)
Glucose-Capillary: 213 mg/dL — ABNORMAL HIGH (ref 70–99)
Glucose-Capillary: 238 mg/dL — ABNORMAL HIGH (ref 70–99)
Glucose-Capillary: 228 mg/dL — ABNORMAL HIGH (ref 70–99)

## 2018-01-02 LAB — PHOSPHORUS
PHOSPHORUS: 1.9 mg/dL — AB (ref 2.5–4.6)
Phosphorus: 1.8 mg/dL — ABNORMAL LOW (ref 2.5–4.6)

## 2018-01-02 LAB — LACTIC ACID, PLASMA: LACTIC ACID, VENOUS: 1.3 mmol/L (ref 0.5–1.9)

## 2018-01-02 LAB — SODIUM
SODIUM: 150 mmol/L — AB (ref 135–145)
Sodium: 142 mmol/L (ref 135–145)

## 2018-01-02 LAB — BRAIN NATRIURETIC PEPTIDE: B NATRIURETIC PEPTIDE 5: 202.6 pg/mL — AB (ref 0.0–100.0)

## 2018-01-02 MED ORDER — INSULIN GLARGINE 100 UNIT/ML ~~LOC~~ SOLN
15.0000 [IU] | Freq: Two times a day (BID) | SUBCUTANEOUS | Status: DC
  Administered 2018-01-02 – 2018-01-03 (×2): 15 [IU] via SUBCUTANEOUS
  Filled 2018-01-02 (×3): qty 0.15

## 2018-01-02 MED ORDER — LABETALOL HCL 5 MG/ML IV SOLN
5.0000 mg | INTRAVENOUS | Status: DC | PRN
  Administered 2018-01-02 – 2018-01-04 (×3): 5 mg via INTRAVENOUS
  Filled 2018-01-02 (×2): qty 4

## 2018-01-02 MED ORDER — PRO-STAT SUGAR FREE PO LIQD
30.0000 mL | Freq: Two times a day (BID) | ORAL | Status: DC
  Administered 2018-01-02 – 2018-01-09 (×16): 30 mL
  Filled 2018-01-02 (×16): qty 30

## 2018-01-02 MED ORDER — VITAL HIGH PROTEIN PO LIQD
1000.0000 mL | ORAL | Status: DC
  Administered 2018-01-02 – 2018-01-09 (×12): 1000 mL
  Filled 2018-01-02 (×3): qty 1000

## 2018-01-02 MED ORDER — FUROSEMIDE 10 MG/ML PO SOLN
20.0000 mg | Freq: Two times a day (BID) | ORAL | Status: DC
  Administered 2018-01-02 – 2018-01-03 (×3): 20 mg via ORAL
  Filled 2018-01-02: qty 2
  Filled 2018-01-02: qty 5
  Filled 2018-01-02: qty 2

## 2018-01-02 MED ORDER — LABETALOL HCL 5 MG/ML IV SOLN
5.0000 mg | INTRAVENOUS | Status: DC | PRN
  Administered 2018-01-02: 5 mg via INTRAVENOUS
  Filled 2018-01-02 (×2): qty 4

## 2018-01-02 NOTE — Progress Notes (Addendum)
NAME:  [name] [name], MRN:  [id number], DOB:  Nov 29, 1945, LOS: 2 ADMISSION DATE:  12/17/2017, CONSULTATION DATE:  9/20 REFERRING MD: Pearlean Brownie , CHIEF COMPLAINT:   MCA Ischemic stroke/ VDRF  Brief History   72 y.o. female with a history of PVD and CHF as well as afib on eliquis who was last seen by family around 7:30 PM.  She arrived to the ED 9/20 am  and she was severely  Dysarthric with right sided weakness, but  able to understand and nod/shake her head.  She was  asked  if she was normal when she awoke at 1 AM. She indicates that she was normal at 1 am.  She indicates that she  developed symptoms after that. Exact time of onset after 1 am  is uncertain.  She has a history of CHF and peripheral vascular disease . CT Head was + for Cerebral infarction due to embolism of right middle cerebral artery. She was out of window for tpa, but within time frame for acute IR intervention. Of interest coags were normal, so Eliquis compliance is questionable. Pt. Was intubated for revascularization. PCCM have been consulted for vent management and assistance with ICU care. Significant Hospital Events   9/20>> Admission 9/20>> CT Brain: Positive CTA for EVLO with occlusive thrombus within the distal basilar artery. 9/21>> 3% saline added at 25cc/hr for Mild increased edema and local mass effect and NA of 136  Consults: date of consult/date signed off & final recs:  Pulmonary 9/20 IR 9/20 PT/OT/ Speech 9/20 Stroke Team 9/20  Procedures (surgical and bedside):  9/20  SP RT Vertbral artery angiogram followed complete revascularization og occluded mid to distal basilar artery  Significant Diagnostic Tests:  12/13/2017>>  CT head 9/20>> CT Brain: Positive CTA for EVLO with occlusive thrombus within the distal basilar artery. 12/20/2017 Echo>>  Extremely limited; EF 35-40%, definity used; apical akinesis; hypokinesis of   the basal inferior wall; overall moderate LV dysfunction;   moderate LVE; mild LVH;  trace AI; mild MR; moderate LAE. 06/2017 Echo Left ventricle: LVEF is approximately 40% with akiensis of the   distal inferior/inferoseptal and apical walls; hypokinesis of the   distal anterior and lateral walls. The cavity size was moderately   dilated. Wall thickness was increased in a pattern of mild LVH. - Mitral valve: There was mild regurgitation. - Left atrium: The atrium was severely dilated. - Right atrium: The atrium was mildly dilated. - Pulmonary arteries: PA peak pressure: 40 mm Hg (S).  Micro Data: 12/07/2017>> Recent toe infection>>STAPHYLOCOCCUS AUREUS    Antimicrobials:  9/20>> None   Subjective:  Slightly more agitated 9/22>. Causing increase in BP Following simple commands Weaning on 10/5 Off all sedation .  Movement of all 4 extremities to command, L>R - 653 cc last 24  T max  99.3 but PCT < 0.10 Troponin 0.03     Objective   Blood pressure (!) 174/72, pulse 69, temperature 98.8 F (37.1 C), temperature source Axillary, resp. rate (!) 25, height 5\' 1"  (1.549 m), weight 112.3 kg, SpO2 100 %.    Vent Mode: PCV FiO2 (%):  [30 %] 30 % Set Rate:  [15 bmp] 15 bmp PEEP:  [5 cmH20] 5 cmH20 Plateau Pressure:  [14 cmH20-26 cmH20] 26 cmH20   Intake/Output Summary (Last 24 hours) at 01/02/2018 0802 Last data filed at 01/02/2018 0700 Gross per 24 hour  Intake 1424.58 ml  Output 1975 ml  Net -550.42 ml   [employer]  01/08/2018 1130  Weight: 112.3 kg    Examination: General: Remains intubated, sedation is off, following some commands HENT: NCAT, thick neck, No LAD, No JVD, ETT secure , OG tube secure Lungs: Bilateral chest excursion, CTA, synchronous with vent, weaning Cardiovascular: Paced, S1, S2, PAC's and PVC's noted, No RMG Abdomen: Obese,  BS+ , with OG clamped, NT, ND,  Body mass index is 46.78 kg/m. 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 Placement of rescue stent across RT PCA ,Bilateral pcoms. Sedated and intubated Suspect some brain stem infarct per neuro 9/21>> edema on follow up scan Na 139>> Started 3% saline at 25 cc/hr for mild cerebral edema Plan: BP goal 120-140 systolic Cleviprex off 9/21 am Coreg and Vasotec as ordered Brilinta BID Frequent neuro checks Monitor for seizures Follow up imaging per neuro Rest per Neuro Primary Will add 3% saline at 50 cc/he Q 6 sodiums Place PICC  Acute Respiratory Insufficiency in setting of Cerebral infarction due to embolism of right middle cerebral artery and IR re-vascularization Placed on PC upon arrival to ICU Former smoker quit 1993 Difficult intubation 9/20 Weaning 10/5 9/22 Plan: Trend CXR SBT trials as tolerated Wean oxygen and titrate PEEP as needed Saturation goals are 88-92% Albuterol nebs prn Monitor bloody secretions per glottic suction and ETT Minimize sedation RASS Goal -1   Hx. Atrial Fibrillation on Eliquis  Hx. CHF, Pulmonary HTN Bi Ventricular pacemeaker Frequent PVC's + 700 since admission Hx PVD Continued Ectopy 9/22 Troponin 0.03 Plan: Echo results noted Tele monitoring EKG prn Trend troponin Trend BNP Monitor I&O daily Will add  lasix 20 BID if tolerates increase to home dose of 40 BID 9/23 CXR prn Trend lactate as needed Consider cards consult Trend mag and calcium   Renal: Hypocalcemia Plan: Trend BMET and UO  daily Replete electrolytes prn ( K+, Mag 9/21) Monitor I&O closely Will replete 1 gram Ca  now Will check ionized calcium  Hematology Eliquis at home Leukocytosis Plan PAS hose for DVT prophylaxis 9/20 Starting 01/01/2018>> Heparin SQ CBC daily PT/INR in am Monitor for bleeding Transfuse for HGB < 7  ID Leukocytosis 12/07/2017>> Recent toe infection + for STAPHYLOCOCCUS AUREUS PCT  WNL  9/21 Plan: Trend fever curve and WBC CBC daily Culture as is clinically indicated Trend PCT PCT to guide ABX therapy  Endocrine + DM + hypothyroid Hyperglycemic HGB A1C>> 8.4 TSH low on home dose synthroid Plan: Consider decreasing synthroid dose CBG Q 4 SSI as ordered Diabetic Team Consult   GI Elevated Triglycerides Most likely 2/2 Cleviprex/ propofol>> Both now off Plan: Trend Triglycerides SUP pepcid Dietitian consult for TF  Neuro>> As above   Family at bedside updated 9/22. They state they have had Living Will discussions with patient but want to see what her functional status will be as she continues to recover before making decisions regarding goals of care..Marland Kitchen  Disposition / Summary of Today's Plan 01/02/18   SP revascularization 9/20 ABG/ CXR Failed wean 9/21 ( HR, RR, BP)     Diet: TF Pain/Anxiety/Delirium protocol : Propofol VAP protocol  DVT prophylaxis: SQ Heparin starting 9/21, PAS hose 9/20  GI prophylaxis: Pepcid Hyperglycemia protocol: CBG/ SSI Mobility:BR Code Status: FC Family Communication:Updated 9/21  Labs   CBC: Recent Labs  Lab 01/09/2018 0450 January 09, 2018 0453 01/01/18 0323 01/02/18 0528  WBC 11.6*  --  10.8* 10.0  NEUTROABS 7.8*  --  8.0*  --   HGB 14.2 14.6 10.4* 10.6*  HCT 44.0 43.0 34.0* 35.7*  MCV 95.0  --  96.3 100.3*  PLT 378  --  310 265   Basic Metabolic Panel: Recent Labs  Lab 12/30/17 1004 09-Jan-2018 0450 09-Jan-2018 0453 01/01/18 0323 01/01/18 1537 01/01/18 2120  NA 139 137 138 139 139 142  K 4.8 4.7 4.7 3.6  --    --   CL 104 103 105 111  --   --   CO2 28 26  --  18*  --   --   GLUCOSE 170* 286* 281* 210*  --   --   BUN 17 20 22 13   --   --   CREATININE 0.88 0.87 0.80 0.78  --   --   CALCIUM 9.6 9.5  --  7.8*  --   --   MG  --   --   --  1.7  --   --   PHOS  --   --   --  2.7  --   --    GFR: Estimated Creatinine Clearance: 76 mL/min (by C-G formula based on SCr of 0.78 mg/dL). Recent Labs  Lab 01/09/18 0450 01/01/18 0323 01/02/18 0528  PROCALCITON  --  <0.10  --   WBC 11.6* 10.8* 10.0  LATICACIDVEN  --   --  1.3   Liver Function Tests: Recent Labs  Lab 12/30/17 1004 Jan 09, 2018 0450  AST 18 20  ALT 14 17  ALKPHOS  --  50  BILITOT 0.8 0.8  PROT 7.7 8.7*  ALBUMIN  --  4.3   No results for input(s): LIPASE, AMYLASE in the last 168 hours. No results for input(s): AMMONIA in the last 168 hours. ABG    Component Value Date/Time   PHART 7.356 01/01/2018 0333   PCO2ART 35.8 01/01/2018 0333   PO2ART 164.0 (H) 01/01/2018 0333   HCO3 20.1 01/01/2018 0333   TCO2 21 (L) 01/01/2018 0333   ACIDBASEDEF 5.0 (H) 01/01/2018 0333   O2SAT 99.0 01/01/2018 0333    Coagulation Profile: Recent Labs  Lab 01/09/18 0450 01/01/18 0323  INR 1.12 1.25   Cardiac Enzymes: Recent Labs  Lab 01/01/18 0323  TROPONINI 0.03*   HbA1C: Hgb A1c MFr Bld  Date/Time Value Ref Range Status  12/30/2017 10:04 AM 8.4 (H) <5.7 % of total Hgb Final    Comment:    For someone without known diabetes, a hemoglobin A1c value of 6.5% or greater indicates that they may have  diabetes and this should be confirmed with a follow-up  test. . For someone with known diabetes, a value <7% indicates  that their diabetes is well controlled and a value  greater than or equal to 7% indicates suboptimal  control. A1c targets should be individualized based on  duration of diabetes, age, comorbid conditions, and  other considerations. . Currently, no consensus exists regarding use of hemoglobin A1c for diagnosis of  diabetes for children. Marland Kitchen  08/09/2017 09:03 AM 9.2 (H) <5.7 % of total Hgb Final    Comment:    For someone without known diabetes, a hemoglobin A1c value of 6.5% or greater indicates that they may have  diabetes and this should be confirmed with a follow-up  test. . For someone with known diabetes, a value <7% indicates  that their diabetes is well controlled and a value  greater than or equal to 7% indicates suboptimal  control. A1c targets should be individualized based on  duration of diabetes, age, comorbid conditions, and  other considerations. . Currently, no consensus exists regarding use of hemoglobin A1c for diagnosis of diabetes for children. .    CBG: Recent Labs  Lab 01/01/18 1606 01/01/18 1916 01/01/18 2307 01/02/18 0314 01/02/18 0743  GLUCAP 192* 182* 169* 196* 213*    Admitting History of Present Illness.   72 y.o. female with a history of PVD and CHF as well as afib on eliquis who was last seen by family around 7:30 PM.  She arrived to the ED 9/20 am  and she was severely  Dysarthric with right sided weakness, but  able to understand and nod/shake her head.  She was  asked  if she was normal when she awoke at 1 AM. She indicates that she was normal at 1 am.  She indicates that she  developed symptoms after that. Exact time of onset  is uncertain , but it was after 1 am.  She has a history of CHF and peripheral vascular disease . CT Head was + for Cerebral infarction due to embolism of right middle cerebral artery. She was out of window for tpa, but within time frame for acute IR intervention. Of interest coags were normal, so Eliquis compliance is questionable. Pt. Was intubated for revascularization. PCCM have been consulted for vent management and assistance with ICU care. Review of Systems:   Unable : intubated and sedated Past medical history  She,  has a past medical history of Atrial fibrillation (HCC), Chronic systolic heart failure (HCC), Colon polyps,  Coronary atherosclerosis of native coronary artery, Diabetes mellitus, type 2 (HCC), Essential hypertension, benign, Hyperlipidemia, Hypothyroidism, Hypothyroidism, Ischemic cardiomyopathy, Myocardial infarction [facility]), PVD (peripheral vascular disease) (HCC), Status cardiac pacemaker, and Ulcer.   Surgical History    Past Surgical History:  Procedure Laterality Date  . ABDOMINAL AORTAGRAM N/A 08/04/2013   Procedure: ABDOMINAL Ronny Flurry;  Surgeon: Sherren Kerns, MD;  Location: [facility] CATH LAB;  Service: Cardiovascular;  Laterality: N/A;  . APPENDECTOMY    . BI-VENTRICULAR PACEMAKER INSERTION N/A 11/20/2013   Procedure: BI-VENTRICULAR PACEMAKER INSERTION (CRT-P);  Surgeon: Marinus Maw, MD;  Location: [facility] CATH LAB;  Service: Cardiovascular;  Laterality: N/A;  . Bilateral tubal ligation    . CARDIAC DEFIBRILLATOR PLACEMENT     [facility] [place]  . CESAREAN SECTION    . CORONARY ARTERY BYPASS GRAFT  2001   [facility] [place] to LAD, SVG to ramus, SVG to OM/PLB, SVG to RCA   . FLEXIBLE SIGMOIDOSCOPY  07/19/1998   Dr. Karilyn Cota  . TONSILLECTOMY       Social History   Social History   Socioeconomic History  . Marital status: Divorced    Spouse name: Not on file  . Number of children: 1  . Years of education: Not on file  . Highest education level: Not on file  Occupational History  . Occupation: Full Time: [employer]  . Occupation: Disabled    Associate Professor: NOT EMPLOYED  Social  Needs  . Financial resource strain: Not on file  . Food insecurity:    Worry: Not on file    Inability: Not on file  . Transportation needs:    Medical: Not on file    Non-medical: Not on file  Tobacco Use  . Smoking status: Former Smoker    Last attempt to quit: 09/16/1991    Years since quitting: 26.3  . Smokeless tobacco: Never Used  Substance and Sexual Activity  . Alcohol use: No    Alcohol/week: 0.0 standard drinks  . Drug use: No  . Sexual activity: Never    Birth control/protection:  Post-menopausal  Lifestyle  . Physical activity:    Days per week: Not on file    Minutes per session: Not on file  . Stress: Not on file  Relationships  . Social connections:    Talks on phone: Not on file    Gets together: Not on file    Attends religious service: Not on file    Active member of club or organization: Not on file    Attends meetings of clubs or organizations: Not on file    Relationship status: Not on file  . Intimate partner violence:    Fear of current or ex partner: Not on file    Emotionally abused: Not on file    Physically abused: Not on file    Forced sexual activity: Not on file  Other Topics Concern  . Not on file  Social History Narrative  . Not on file  ,  reports that she quit smoking about 26 years ago. She has never used smokeless tobacco. She reports that she does not drink alcohol or use drugs.   Family history   Her family history includes AAA (abdominal aortic aneurysm) in her father; Coronary artery disease (age of onset: 39) in her sister; Diabetes in her father, mother, and sister; Heart attack in her father and sister; Heart disease in her father, mother, and sister; Hyperlipidemia in her father and mother; Hypertension in her father and mother; Ulcers in her mother.   Allergies Allergies  Allergen Reactions  . Rosiglitazone Other (See Comments)    CHF  . Tapentadol Rash  . Tape     Left rash per patient  . Celecoxib Other (See Comments)    Congestive heart failure    Home meds  Prior to Admission medications   Medication Sig Start Date End Date Taking? Authorizing Provider  ALPRAZolam (XANAX) 1 MG tablet TAKE ONE-HALF TO ONE TABLET BY MOUTH TWOTIMES A DAY AS NEEDED FOR ANXIETY Patient taking differently: Take 0.5 mg by mouth 2 (two) times daily as needed. TAKE ONE-HALF TO ONE TABLET BY MOUTH TWOTIMES A DAY AS NEEDED FOR ANXIETY 10/07/17  Yes Campbell Riches, NP  apixaban (ELIQUIS) 5 MG TABS tablet Take 1 tablet (5 mg total) by  mouth 2 (two) times daily. 11/05/16  Yes Marinus Maw, MD  carvedilol (COREG) 25 MG tablet TAKE ONE TABLET TWICE DAILY WITH A MEAL Patient taking differently: Take 12.5 mg by mouth 2 (two) times daily with a meal. TAKE ONE TABLET TWICE DAILY WITH A MEAL 11/05/16  Yes Marinus Maw, MD  Cholecalciferol (VITAMIN D3) 5000 units CAPS Take 1 capsule (5,000 Units total) by mouth daily. 08/19/17  Yes Roma Kayser, MD  enalapril (VASOTEC) 10 MG tablet Take 1 tablet (10 mg total) by mouth 2 (two) times daily. Patient taking differently: Take 10 mg by mouth daily.  11/05/16  Yes Marinus Maw, MD  furosemide (LASIX) 40 MG tablet Take 1 tablet (40 mg total) by mouth 2 (two) times daily. 11/05/16  Yes Marinus Maw, MD  insulin lispro (HUMALOG KWIKPEN) 100 UNIT/ML KiwkPen Inject 0.24-0.3 mLs (24-30 Units total) into the skin 3 (three) times daily. 08/19/17  Yes Nida, Denman George, MD  LEVEMIR FLEXTOUCH 100 UNIT/ML Pen INJECT 80 UNITS INTO THE SKIN DAILY AT 10:00PM Patient taking differently: Inject 80 Units into the skin at bedtime.  08/11/17  Yes Nida, Denman George, MD  multivitamin [facility]) per tablet Take 1 tablet by mouth daily.     Yes [provider]  Omega-3 Fatty Acids (FISH OIL) 1000 MG CAPS Take 1,000 mg by mouth daily.   Yes [provider]  potassium chloride SA (K-DUR,KLOR-CON) 20 MEQ tablet Take 1 tablet (20 mEq total) by mouth 2 (two) times daily. 06/10/17  Yes Jonelle Sidle, MD  SYNTHROID 125 MCG tablet TAKE ONE (1) TABLET BY MOUTH EVERY DAY Patient taking differently: Take 125 mcg by mouth daily before breakfast.  10/07/17  Yes Nida, Denman George, MD  traMADol (ULTRAM) 50 MG tablet TAKE ONE TABLET ONCE DAILY FOR MODERATE PAIN Patient taking differently: Take 50 mg by mouth as needed for moderate pain. TAKE ONE TABLET ONCE DAILY FOR MODERATE PAIN 06/11/17  Yes Merlyn Albert, MD  VICTOZA 18 MG/3ML SOPN INJECT 0.3ML'S (1.8MG  TOTAL) INTO THE SKIN ONCE  DAILY Patient taking differently: Inject 1.8 mg into the skin daily.  07/12/17  Yes Roma Kayser, MD  vitamin E 600 UNIT capsule Take 600 Units by mouth daily.     Yes [provider]  ACCU-CHEK AVIVA PLUS test strip USE TO TEST GLUCOSE 4 TIMES A DAY 12/27/17   Roma Kayser, MD  Blood Glucose Monitoring Suppl (ACCU-CHEK AVIVA) device Use as instructed 05/06/15   Roma Kayser, MD  Continuous Blood Gluc Sensor (FREESTYLE LIBRE SENSOR SYSTEM) MISC Use one sensor every 10 days. 12/24/16   Roma Kayser, MD  glucose blood test strip Accu-Chek Aviva Plus test strips    [provider]  Insulin Pen Needle (B-D ULTRAFINE III SHORT PEN) 31G X 8 MM MISC Use 5 x daily as directed 09/02/16   Roma Kayser, MD  OVER THE COUNTER MEDICATION Take 1 tablet by mouth daily. Phyomega vitamin.    [provider]      Bevelyn Ngo, AGACNP-BC [facility] Pulmonary/Critical Care Medicine Pager # [phone] 01/02/2018 8:02 AM

## 2018-01-02 NOTE — Progress Notes (Signed)
Inpatient Diabetes Program Recommendations  AACE/ADA: New Consensus Statement on Inpatient Glycemic Control (2019)  Target Ranges:  Prepandial:   less than 140 mg/dL      Peak postprandial:   less than 180 mg/dL (1-2 hours)      Critically ill patients:  140 - 180 mg/dL   Results for [name] [name], [name] [name] (MRN [id number]) as of 01/02/2018 09:11  Ref. Range 01/01/2018 07:17 01/01/2018 11:10 01/01/2018 16:06 01/01/2018 19:16 01/01/2018 23:07 01/02/2018 03:14 01/02/2018 07:43  Glucose-Capillary Latest Ref Range: 70 - 99 mg/dL 409237 (H)  Novolog 7 units 220 (H)  Novolog 7 units  Lantus 20 units 192 (H)  Novolog 4 units 182 (H)  Novolog 4 units 169 (H)  Novolog 4 units 196 (H)  Novolog 4 units 213 (H)  Novolog 7 units   Review of Glycemic Control  Diabetes history:DM2 Outpatient Diabetes medications: Levemir 80 units QHS, Humalog 24-30 units TID with meals, Victoza 1.8 mg daily Current orders for Inpatient glycemic control: Lantus 20 units daily, Novolog 0-20 units Q4H  Inpatient Diabetes Program Recommendations: Insulin - Basal: Please consider increasing Lantus to 30 units daily.   Insulin -Tube Feeding Coverage: Once tube feedings are started, if glucose continues to be consistently greater than 180 mg/dl, please consider ordering Novolog 3 units Q4H for tube feeding coverage. If tube feeding is stopped or held then Novolog tube feeding coverage would also be stopped or held.  NOTE: Noted consult for Diabetes Coordinator. Diabetes Coordinator is not on campus over the weekend but available by pager from 8am to 5pm for questions or concerns. Chart reviewed. Patient remains intubated and noted consult for RD to initiate tube feedings. Over the past 24 hours patient has received [name] total of Novolog 37 units of correction and glucose has ranged from 169-237 mg/dl. Recommend increasing Lantus to 30 units daily and once tube feedings are started, if glucose is consistently elevated then order Novolog  3 units Q4H for tube feeding coverage.  Thanks, Orlando PennerMarie Dalton Molesworth, RN, MSN, CDE Diabetes Coordinator Inpatient Diabetes Program [phone] (Team Pager from 8am to 5pm)

## 2018-01-02 NOTE — Progress Notes (Signed)
PT Cancellation Note  Patient Details Name: [name] [name] MRN: [id number] DOB: 05/04/1945   Cancelled Treatment:    Reason Eval/Treat Not Completed: Patient not medically ready(remains intubated)   Moya Duan B Dariya Gainer 01/02/2018, 7:00 AM  Delaney MeigsMaija Tabor Irlanda Croghan, PT Acute Rehabilitation Services Pager: [phone] Office: [phone]

## 2018-01-02 NOTE — Progress Notes (Signed)
OT Cancellation Note  Patient Details Name: [name] [name] MRN: [id number] DOB: 04-03-1946   Cancelled Treatment:    Reason Eval/Treat Not Completed: Patient not medically ready.  Pt remains intubated.  [name] [name], OTR/L Acute Rehabilitation Services Pager [phone] Office [phone]   [name] HawkingConarpe, [name] [name] 01/02/2018, 8:35 AM

## 2018-01-02 NOTE — Plan of Care (Signed)
Patient remains intubated with measures of care to reduce risk of further complications are ongoing

## 2018-01-02 NOTE — Progress Notes (Signed)
NEUROHOSPITALISTS STROKE TEAM - DAILY PROGRESS NOTE   SUBJECTIVE Patient still intubated and off sedation.  RN is at bedside. Pt eyes closed but able to open eyes on voice and following simple commands. Eyes fixed position, RUE weakness. Just started on tube feeding.   OBJECTIVE Most recent Vital Signs: Vitals:   01/02/18 0600 01/02/18 0630 01/02/18 0700 01/02/18 0730  BP: (!) 165/67 (!) 156/67 (!) 160/67 (!) 174/72  Pulse: 70 70 70 69  Resp: (!) 25 (!) 24 (!) 24 (!) 25  Temp:      TempSrc:      SpO2:      Weight:      Height:       CBG (last 3)  Recent Labs    01/01/18 2307 01/02/18 0314 01/02/18 0743  GLUCAP 169* 196* 213*    Physical Exam  HEENT-intubated.   Cardiovascular- irregularly irregular heart rhythm Lungs-vented breath sounds.  Saturations within normal limits Abdomen-obese Musculoskeletal: Pulses weak in right lower extremities, only audible with Doppler and +1 in left lower extremity Skin-warm and dry.  Left groin dressing clean dry and intact no hematoma noted  Neuro:  Intubated off sedation. Eyes closed but able to open briefly on voice. PERRL, fixed middle position, not lateral gaze. Corneal positive b/l. Very weak gag and cough. Not blinking to visual threat. Facial symmetry not able to test due to ET tube. LUE 3/5 proximal and distal and following commands. LLE 3-/5 proximal and distal 3+/5. RUE flaccid. RLE 2/5 proximal and 3-/5 distally. DTR 1+ and bilateral babinski. Sensation, coordination and gait not tested.  Lipid Panel    Component Value Date/Time   CHOL 135 01/01/2018 0323   TRIG 206 (H) 01/01/2018 0323   HDL 21 (L) 01/01/2018 0323   CHOLHDL 6.4 01/01/2018 0323   VLDL 41 (H) 01/01/2018 0323   LDLCALC 73 01/01/2018 0323   LDLCALC 118 (H) 08/09/2017 0903   HgbA1C  Lab Results  Component Value Date   HGBA1C 8.4 (H) 12/30/2017    Urine Drug Screen:      Component Value Date/Time   LABOPIA NONE DETECTED 11-Jan-2018 1747   COCAINSCRNUR NONE DETECTED 01/11/2018 1747   LABBENZ NONE DETECTED 01/11/2018 1747   AMPHETMU NONE DETECTED 01-11-2018 1747   THCU NONE DETECTED January 11, 2018 1747   LABBARB NONE DETECTED January 11, 2018 1747    Alcohol Level:  Recent Labs  Lab January 11, 2018 0450  ETH <10    Ct Angio Head/ Neck W Or Wo Contrast/ CT Perfusion  2018/01/11 IMPRESSION:  1. 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 Superior cerebellar arteries not visualized. PCAs are perfused at this time via collateralization from small posterior communicating arteries. 2. No acute core infarct identified by CT perfusion. 3. Approximate 50% stenosis at the origin of the right vertebral artery. Right vertebral artery dominant and otherwise widely patent to the vertebrobasilar junction. 4. Poor opacification of the distal left V2 segment, which may be occluded. Reconstitution at the V3 segment, which remains patent to the cranial vault. Multifocal moderate V4 stenoses as above. 5. Atheromatous stenoses of up to approximately 70-80% about the carotid bifurcations bilaterally. Evaluation limited by prominent calcifications and motion artifact  on this exam. 6. 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 7. 60% stenosis at the origin of the left subclavian artery. 8. Patchy multifocal consolidative opacities within the visualized lungs, concerning for pneumonia. Evidence for underlying pulmonary interstitial edema.   Dg Chest 1 View 16-Jan-2018 IMPRESSION: Tube positions as described without pneumothorax. Pulmonary vascular congestion. Mild perihilar pulmonary edema. No frank consolidation. Left base atelectasis noted. Postoperative changes noted. Electronically Signed   By: Bretta Bang III M.D.   On: January 16, 2018 12:16   Ct Head Wo Contrast 2018-01-16 IMPRESSION: 1. Involving nonhemorrhagic infarct involving the left occipital  lobe is described. This is consistent with acute left PCA occlusion. 2. No other acute or focal cortical or basal ganglia infarct evident. 3. Moderate diffuse white matter disease. 4. Anterior ethmoid and frontal sinus disease may be related to intubation.  EKG :A. fib /a flutter  Ct Head Wo Contrast  Result Date: 01/01/2018 CLINICAL DATA:  72 y/o  F; stroke for follow-up. EXAM: CT HEAD WITHOUT CONTRAST TECHNIQUE: Contiguous axial images were obtained from the base of the skull through the vertex without intravenous contrast. COMPARISON:  01-16-2018 CT head, CTA head, CT perfusion head. FINDINGS: Brain: Subacute infarct involving the left occipital lobe, left SCA distribution of cerebellum, and right thalamus with mild edema and local mass effect. No associated hemorrhage. No additional focus of stroke, hemorrhage, or focal mass effect identified. No extra-axial collection, hydrocephalus, or effacement of basilar cisterns. Stable background of chronic microvascular ischemic changes and volume loss of the brain. Vascular: Tip of basilar to right PCA stent in situ. Skull: Normal. Negative for fracture or focal lesion. Sinuses/Orbits: No acute finding. Other: Bilateral intra-ocular lens replacement. IMPRESSION: Subacute infarct involving left occipital lobe, left SCA distribution of cerebellum, and right thalamus. Mild increased edema and local mass effect. No herniation or associated hemorrhage. Electronically Signed   By: Mitzi Hansen M.D.   On: 01/01/2018 02:36   Dg Chest Port 1 View  Result Date: 01/01/2018 CLINICAL DATA:  Respiratory failure EXAM: PORTABLE CHEST 1 VIEW COMPARISON:  01-16-2018 FINDINGS: Endotracheal tube terminates 3.5 cm above the carina. Mild perihilar edema, right lower lobe predominant. Less likely, multifocal right lung infection is possible. No definite pleural effusions. No pneumothorax. Heart is top-normal in size. Postsurgical changes related to prior CABG. Left  subclavian ICD. Enteric tube courses below the diaphragm. IMPRESSION: Endotracheal tube terminates 3.5 cm above the carina. Suspected mild perihilar edema, right lower lobe predominant. Less likely, multifocal right lung infection is possible. Postoperative changes with additional support apparatus as above. Electronically Signed   By: Charline Bills M.D.   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   CTA head & neck : Positive CTA for evolving LVO with occlusive thrombus within the distal basilar artery. Superior cerebellar arteries not visualized. Approximate 50% stenosis at the origin of the right vertebral artery,  Atheromatous stenoses of up to approximately 70-80% about the carotid bifurcations bilaterally. 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   MRI of the brain : not performed due to pacemaker  Repeat Head CT 9/20: Evolving Left PCA nonhemorrhagic infarct.   Repeat CT head 9/21 - left PCA, left SCA and right thalamic infarcts  2D Echocardiogram EF 35 to 40%  LDL 118  HgbA1c 8.4  VTE: heparin subq  Antiplatelets :  Aspirin 81 mg daily/Brilinta 90 mg twice daily  Continue Rehab with PT consult, OT consult, Speech consult  Therapy recommendations:   Pending  Disposition:   Pending   Atrial Fibrillation  Home anticoagulation:   Eliquis  Home medications, Betapace   CHA2DS2-VASc Score = 6             Age in Years:  ?7375                         Sex:  Female   Female                      Hypertension History:  yes                          Diabetes Mellitus:  0             Congestive Heart Failure History:  0             Vascular Disease History:  yes                             Stroke/TIA/Thromboembolism History:  yes     Meds: Coreg  Due to  multifocal infarcts with moderate size on CT, will plan to resume eliquis in 7 days post stroke.    Respiratory failure  Intubated off sedation  CCM on board  Wean off vent as able  On weaning trial  Hypertension urgency  Stable off cleviprex  BP goal < 160 post procedure  Home medications: Enalapril, Coreg  Hyperlipidemia  Home meds:   None  LDL 118, goal < 70  on lipitor 80mg  daily  Continue statin at discharge   Uncontrolled diabetes mellitus  A1C 8.4, goal < 7.0  SSI  CBG monitoring  Hyperglycemia  Change to lantus 15U bid  DM coordinator consult pending  CHF/CAD s/p CABG  Currently on aspirin and Brilinta post cardiac  Stent  Continue coreg and home lasix  Avoid fluid overload  Dysphagia  On tube feeding  CBG every 4 hours  Other Stroke Risk Factors  Advanced age  Former smoker  PVD   Other Active Problems  GERD-continue PPI  Hypothyroidism-continue Synthroid   Hospital day # 2  This patient is critically ill due to posterior infarcts, dysphagia, respiratory failure, BA occlusion s/p IR and at significant risk of neurological worsening, death form recurrent strokes, hemorrhagic conversion, cerebral edema, dysphagia, aspiration, seizure. This patient's care requires constant monitoring of vital signs, hemodynamics, respiratory and cardiac monitoring, review of multiple databases, neurological assessment, discussion with family, other specialists and medical decision making of high complexity. I spent 40 minutes of neurocritical care time in the care of this patient.   Marvel PlanJindong Christianna Belmonte, MD PhD Stroke Neurology 01/02/2018 12:34 PM     To contact Stroke Continuity provider, please refer to [email or web address]. After hours, contact General Neurology

## 2018-01-02 NOTE — Progress Notes (Signed)
Brief Nutrition Note  Consult received for enteral/tube feeding initiation and management.  Adult Enteral Nutrition Protocol initiated. RD placed ordered recommendations form initial assessment on 9/20. [facility]  Admitting Dx: Stroke (HCC) [I63.9]  Body mass index is 46.78 kg/m. Pt meets criteria for morbid obesity based on current BMI.  Labs:  Recent Labs  Lab 06-23-17 0450 06-23-17 0453 01/01/18 0323 01/01/18 1537 01/01/18 2120 01/02/18 0816  NA 137 138 139 139 142 141  K 4.7 4.7 3.6  --   --  4.3  CL 103 105 111  --   --  114*  CO2 26  --  18*  --   --  18*  BUN 20 22 13   --   --  12  CREATININE 0.87 0.80 0.78  --   --  0.77  CALCIUM 9.5  --  7.8*  --   --  8.0*  MG  --   --  1.7  --   --  2.2  PHOS  --   --  2.7  --   --   --   GLUCOSE 286* 281* 210*  --   --  252*    Tilda FrancoLindsey Tawny Raspberry, MS, RD, LDN [facility] Clinical Dietitian Pager: [phone] After Hours Pager: [phone]

## 2018-01-03 ENCOUNTER — Encounter (HOSPITAL_COMMUNITY): Payer: Self-pay | Admitting: Interventional Radiology

## 2018-01-03 ENCOUNTER — Inpatient Hospital Stay (HOSPITAL_COMMUNITY): Payer: Medicare Other

## 2018-01-03 DIAGNOSIS — I161 Hypertensive emergency: Secondary | ICD-10-CM

## 2018-01-03 DIAGNOSIS — I6340 Cerebral infarction due to embolism of unspecified cerebral artery: Secondary | ICD-10-CM

## 2018-01-03 DIAGNOSIS — J988 Other specified respiratory disorders: Secondary | ICD-10-CM

## 2018-01-03 LAB — BASIC METABOLIC PANEL
ANION GAP: 8 (ref 5–15)
BUN: 19 mg/dL (ref 8–23)
CALCIUM: 8.4 mg/dL — AB (ref 8.9–10.3)
CO2: 24 mmol/L (ref 22–32)
Chloride: 112 mmol/L — ABNORMAL HIGH (ref 98–111)
Creatinine, Ser: 0.73 mg/dL (ref 0.44–1.00)
GFR calc non Af Amer: >60 mL/min (ref >60)
Glucose, Bld: 267 mg/dL — ABNORMAL HIGH (ref 70–99)
POTASSIUM: 3.7 mmol/L (ref 3.5–5.1)
SODIUM: 144 mmol/L (ref 135–145)

## 2018-01-03 LAB — GLUCOSE, CAPILLARY
GLUCOSE-CAPILLARY: 333 mg/dL — AB (ref 70–99)
GLUCOSE-CAPILLARY: 259 mg/dL — AB (ref 70–99)
GLUCOSE-CAPILLARY: 212 mg/dL — AB (ref 70–99)
GLUCOSE-CAPILLARY: 192 mg/dL — AB (ref 70–99)
Glucose-Capillary: 232 mg/dL — ABNORMAL HIGH (ref 70–99)
Glucose-Capillary: 233 mg/dL — ABNORMAL HIGH (ref 70–99)

## 2018-01-03 LAB — MAGNESIUM
MAGNESIUM: 1.9 mg/dL (ref 1.7–2.4)
MAGNESIUM: 1.9 mg/dL (ref 1.7–2.4)

## 2018-01-03 LAB — CBC
HCT: 33.8 % — ABNORMAL LOW (ref 36.0–46.0)
Hemoglobin: 10.3 g/dL — ABNORMAL LOW (ref 12.0–15.0)
MCH: 29.9 pg (ref 26.0–34.0)
MCHC: 30.5 g/dL (ref 30.0–36.0)
MCV: 98.3 fL (ref 78.0–100.0)
PLATELETS: 272 10*3/uL (ref 150–400)
RBC: 3.44 MIL/uL — ABNORMAL LOW (ref 3.87–5.11)
RDW: 13.4 % (ref 11.5–15.5)
WBC: 10.1 10*3/uL (ref 4.0–10.5)

## 2018-01-03 LAB — CALCIUM, IONIZED

## 2018-01-03 LAB — SODIUM: Sodium: 144 mmol/L (ref 135–145)

## 2018-01-03 LAB — PHOSPHORUS
PHOSPHORUS: 2.5 mg/dL (ref 2.5–4.6)
Phosphorus: 2.6 mg/dL (ref 2.5–4.6)

## 2018-01-03 LAB — TROPONIN I: TROPONIN I: 0.03 ng/mL — AB (ref <0.03)

## 2018-01-03 MED ORDER — INSULIN GLARGINE 100 UNIT/ML ~~LOC~~ SOLN
20.0000 [IU] | Freq: Two times a day (BID) | SUBCUTANEOUS | Status: DC
  Administered 2018-01-03 – 2018-01-09 (×13): 20 [IU] via SUBCUTANEOUS
  Filled 2018-01-03 (×14): qty 0.2

## 2018-01-03 MED ORDER — BISACODYL 10 MG RE SUPP: 10.0000 mg | Freq: Every day | RECTAL | Status: DC | PRN

## 2018-01-03 MED ORDER — FUROSEMIDE 10 MG/ML PO SOLN
40.0000 mg | Freq: Two times a day (BID) | ORAL | Status: DC
  Administered 2018-01-03 – 2018-01-04 (×2): 40 mg
  Filled 2018-01-03 (×2): qty 4

## 2018-01-03 MED ORDER — POTASSIUM CHLORIDE 20 MEQ/15ML (10%) PO SOLN
40.0000 meq | Freq: Every day | ORAL | Status: DC
  Administered 2018-01-03 – 2018-01-05 (×3): 40 meq
  Filled 2018-01-03 (×3): qty 30

## 2018-01-03 MED ORDER — DOCUSATE SODIUM 50 MG/5ML PO LIQD: 50.0000 mg | Freq: Two times a day (BID) | ORAL | Status: DC | PRN

## 2018-01-03 MED ORDER — SODIUM CHLORIDE 0.9 % IV SOLN
INTRAVENOUS | Status: DC | PRN
  Administered 2018-01-03: 500 mL via INTRAVENOUS
  Administered 2018-01-07: 250 mL via INTRAVENOUS
  Administered 2018-01-10: 500 mL via INTRAVENOUS

## 2018-01-03 MED ORDER — INSULIN ASPART 100 UNIT/ML ~~LOC~~ SOLN
3.0000 [IU] | SUBCUTANEOUS | Status: DC
  Administered 2018-01-03 – 2018-01-04 (×9): 3 [IU] via SUBCUTANEOUS

## 2018-01-03 NOTE — Progress Notes (Signed)
PT Cancellation Note/Discharge  Patient Details Name: [name] [name] MRN: [id number] DOB: [birth year]   Cancelled Treatment:    Reason Eval/Treat Not Completed: Other (comment).  PT orders were discontinued.  Please re-order therapies if/when this pt is stable enough to be seen.  Thanks,    Rollene Rotundaebecca B. Doretta Remmert, PT, DPT  Acute Rehabilitation [phone] pager #[phone] office   01/03/2018, 3:05 PM

## 2018-01-03 NOTE — Progress Notes (Signed)
NEUROHOSPITALISTS STROKE TEAM - DAILY PROGRESS NOTE   SUBJECTIVE Patient daughter and son at bedside.  Patient still intubated and off sedation.  No significant neuro changes, but more lethargic than yesterday. Pt eyes still closed but able to open eyes on voice and following simple commands. Eyes fixed position, RUE weakness.  On tube feeding.  OBJECTIVE Most recent Vital Signs: Vitals:   01/03/18 1100 01/03/18 1118 01/03/18 1200 01/03/18 1300  BP: (!) 153/55 130/83 (!) 151/58 (!) 161/53  Pulse: 70 70 70 70  Resp: (!) 33 (!) 29 (!) 28 (!) 29  Temp:      TempSrc:      SpO2:  99%    Weight:      Height:       CBG (last 3)  Recent Labs    01/03/18 0434 01/03/18 0741 01/03/18 1154  GLUCAP 232* 233* 333*    Physical Exam  HEENT-intubated.   Cardiovascular- irregularly irregular heart rhythm Lungs-vented breath sounds.  Saturations within normal limits Abdomen-obese Musculoskeletal: Pulses weak in right lower extremities, only audible with Doppler and +1 in left lower extremity Skin-warm and dry.  Left groin dressing clean dry and intact no hematoma noted  Neuro:  Intubated off sedation. Eyes closed but able to open briefly on voice. PERRL, fixed middle position, not lateral gaze. Corneal positive b/l. Very weak gag and cough. Not blinking to visual threat. Facial symmetry not able to test due to ET tube. LUE 3/5 proximal and distal and following commands. LLE 3-/5 proximal and distal 3+/5. RUE flaccid. RLE 2/5 proximal and 3-/5 distally. DTR 1+ and bilateral babinski. Sensation, coordination and gait not tested.  Lipid Panel    Component Value Date/Time   CHOL 135 01/01/2018 0323   TRIG 206 (H) 01/01/2018 0323   HDL 21 (L) 01/01/2018 0323   CHOLHDL 6.4 01/01/2018 0323   VLDL 41 (H) 01/01/2018 0323   LDLCALC 73 01/01/2018 0323   LDLCALC 118 (H) 08/09/2017 0903   HgbA1C  Lab Results  Component Value Date   HGBA1C 8.4  (H) 12/30/2017    Urine Drug Screen:      Component Value Date/Time   LABOPIA NONE DETECTED 01/26/18 1747   COCAINSCRNUR NONE DETECTED 2018/01/26 1747   LABBENZ NONE DETECTED 26-Jan-2018 1747   AMPHETMU NONE DETECTED 2018/01/26 1747   THCU NONE DETECTED 2018-01-26 1747   LABBARB NONE DETECTED 01-26-18 1747    Alcohol Level:  Recent Labs  Lab January 26, 2018 0450  ETH <10    Ct Angio Head/ Neck W Or Wo Contrast/ CT Perfusion  01-26-18 IMPRESSION:  1. 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 Superior cerebellar arteries not visualized. PCAs are perfused at this time via collateralization from small posterior communicating arteries. 2. No acute core infarct identified by CT perfusion. 3. Approximate 50% stenosis at the origin of the right vertebral artery. Right vertebral artery dominant and otherwise widely patent to the vertebrobasilar junction. 4. Poor opacification of the distal left V2 segment, which may be occluded. Reconstitution at the V3 segment, which remains patent to the cranial vault. Multifocal moderate V4 stenoses as above. 5. Atheromatous stenoses of up to approximately 70-80% about the  carotid bifurcations bilaterally. Evaluation limited by prominent calcifications and motion artifact on this exam. 6. 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 7. 60% stenosis at the origin of the left subclavian artery. 8. Patchy multifocal consolidative opacities within the visualized lungs, concerning for pneumonia. Evidence for underlying pulmonary interstitial edema.   Dg Chest 1 View 12/28/2017 IMPRESSION: Tube positions as described without pneumothorax. Pulmonary vascular congestion. Mild perihilar pulmonary edema. No frank consolidation. Left base atelectasis noted. Postoperative changes noted. Electronically Signed   By: Bretta Bang III M.D.   On: 01/03/2018 12:16   Ct Head Wo Contrast  01/06/2018 IMPRESSION: 1. Involving nonhemorrhagic infarct involving the left occipital lobe is described. This is consistent with acute left PCA occlusion. 2. No other acute or focal cortical or basal ganglia infarct evident. 3. Moderate diffuse white matter disease. 4. Anterior ethmoid and frontal sinus disease may be related to intubation.  EKG :A. fib /a flutter  Ct Head Wo Contrast  Result Date: 01/01/2018 CLINICAL DATA:  72 y/o  F; stroke for follow-up. EXAM: CT HEAD WITHOUT CONTRAST TECHNIQUE: Contiguous axial images were obtained from the base of the skull through the vertex without intravenous contrast. COMPARISON:  01/01/2018 CT head, CTA head, CT perfusion head. FINDINGS: Brain: Subacute infarct involving the left occipital lobe, left SCA distribution of cerebellum, and right thalamus with mild edema and local mass effect. No associated hemorrhage. No additional focus of stroke, hemorrhage, or focal mass effect identified. No extra-axial collection, hydrocephalus, or effacement of basilar cisterns. Stable background of chronic microvascular ischemic changes and volume loss of the brain. Vascular: Tip of basilar to right PCA stent in situ. Skull: Normal. Negative for fracture or focal lesion. Sinuses/Orbits: No acute finding. Other: Bilateral intra-ocular lens replacement. IMPRESSION: Subacute infarct involving left occipital lobe, left SCA distribution of cerebellum, and right thalamus. Mild increased edema and local mass effect. No herniation or associated hemorrhage. Electronically Signed   By: Mitzi Hansen M.D.   On: 01/01/2018 02:36   Dg Chest Port 1 View  Result Date: 01/01/2018 CLINICAL DATA:  Respiratory failure EXAM: PORTABLE CHEST 1 VIEW COMPARISON:  12/16/2017 FINDINGS: Endotracheal tube terminates 3.5 cm above the carina. Mild perihilar edema, right lower lobe predominant. Less likely, multifocal right lung infection is possible. No definite pleural effusions. No  pneumothorax. Heart is top-normal in size. Postsurgical changes related to prior CABG. Left subclavian ICD. Enteric tube courses below the diaphragm. IMPRESSION: Endotracheal tube terminates 3.5 cm above the carina. Suspected mild perihilar edema, right lower lobe predominant. Less likely, multifocal right lung infection is possible. Postoperative changes with additional support apparatus as above. Electronically Signed   By: Charline Bills M.D.   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   CTA head & neck : Positive CTA for evolving LVO with occlusive thrombus within the distal basilar artery. Superior cerebellar arteries not visualized. Approximate 50% stenosis at the origin of the right vertebral artery,  Atheromatous stenoses of up to approximately 70-80% about the carotid bifurcations bilaterally. 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   MRI of the brain : not performed due to pacemaker  Repeat Head CT 9/20: Evolving Left PCA nonhemorrhagic infarct.   Repeat CT head 9/21 - left PCA, left SCA and right thalamic infarcts  2D Echocardiogram EF 35 to 40%  LDL  118  HgbA1c 8.4  VTE: heparin subq  Antiplatelets : Aspirin 81 mg daily/Brilinta 90 mg twice daily  Continue Rehab with PT consult, OT consult, Speech consult  Therapy recommendations:   Pending  Disposition:   Pending   Atrial Fibrillation  Home anticoagulation:   Eliquis  Home medications, Betapace   CHA2DS2-VASc Score = 6             Age in Years:  ?3375                         Sex:  Female   Female                      Hypertension History:  yes                          Diabetes Mellitus:  0             Congestive Heart Failure History:  0             Vascular Disease History:  yes                              Stroke/TIA/Thromboembolism History:  yes     Meds: Coreg  Due to multifocal infarcts with moderate size on CT, will plan to resume eliquis in 7 days post stroke.    Respiratory failure  Intubated off sedation  CCM on board  Wean off vent as able  Tolerated weaning trial so far  However, mental status not enough for extubation due to not able to protect airway.   Hypertension urgency  Stable off cleviprex  BP goal < 160 post procedure  Home medications: Enalapril, Coreg  Hyperlipidemia  Home meds:   None  LDL 118, goal < 70  on lipitor 80mg  daily  Continue statin at discharge   Uncontrolled diabetes mellitus  A1C 8.4, goal < 7.0  SSI  CBG monitoring  Hyperglycemia after tube feeding  Change to lantus 20U bid, add 3 units NovoLog every 4 hours for tube feeding coverage  DM coordinator recs appreciated  CHF/CAD s/p CABG  Currently on aspirin and Brilinta post cardiac stent  Continue coreg and home lasix  Avoid fluid overload  Dysphagia  On tube feeding  CBG every 4 hours  NovoLog coverage  Other Stroke Risk Factors  Advanced age  Former smoker  PVD   Other Active Problems  GERD-continue PPI  Hypothyroidism-continue Synthroid   Hospital day # 3  This patient is critically ill due to posterior infarcts, dysphagia, respiratory failure, BA occlusion s/p IR and at significant risk of neurological worsening, death form recurrent strokes, hemorrhagic conversion, cerebral edema, dysphagia, aspiration, seizure. This patient's care requires constant monitoring of vital signs, hemodynamics, respiratory and cardiac monitoring, review of multiple databases, neurological assessment, discussion with family, other specialists and medical decision making of high complexity. I spent 40 minutes of neurocritical care time in the care of this patient. I had long discussion with daughter and son at bedside, updated pt current condition,  treatment plan and potential prognosis. They expressed understanding and appreciation.    Marvel PlanJindong Vernadine Coombs, MD PhD Stroke Neurology 01/03/2018 2:01 PM     To contact Stroke Continuity provider, please refer to [email or web address]. After hours, contact General Neurology

## 2018-01-03 NOTE — Progress Notes (Addendum)
NAME:  [name] [name], MRN:  [id number], DOB:  16-Oct-1945, LOS: 3 ADMISSION DATE:  12/24/2017, CONSULTATION DATE:  9/20 REFERRING MD: Pearlean Brownie , CHIEF COMPLAINT:   MCA Ischemic stroke/ VDRF  Brief History   72 y.o. female with a history of PVD and CHF as well as afib on eliquis who was last seen by family around 7:30 PM.  She arrived to the ED 9/20 am and she was severely dysarthric with right sided weakness, but  able to understand and nod/shake her head.  She was asked  if she was normal when she awoke at 1 AM. She indicates that she was normal at 1 am.  She indicates that she  developed symptoms after that. Exact time of onset after 1 am  is uncertain. CT Head was + for Cerebral infarction due to embolism of right middle cerebral artery. She was out of window for tpa, but within time frame for acute IR intervention. Of interest coags were normal, so Eliquis compliance is questionable. Pt. was intubated for revascularization. PCCM have been consulted for vent management and assistance with ICU care.  Significant Hospital Events   9/20>> Admission 9/20>> CT Brain: Positive CTA for EVLO with occlusive thrombus within the distal basilar artery. 9/21>> 3% saline added at 25cc/hr for Mild increased edema and local mass effect and NA of 136  Consults: date of consult/date signed off & final recs:  Pulmonary 9/20 IR 9/20 PT/OT/ Speech 9/20 Stroke Team 9/20  Procedures (surgical and bedside):  9/20  SP RT Vertbral artery angiogram followed complete revascularization og occluded mid to distal basilar artery  Significant Diagnostic Tests:  12/30/2017>>  CT head 9/20>> CT Brain: Positive CTA for EVLO with occlusive thrombus within the distal basilar artery. 12/17/2017 Echo>>  Extremely limited; EF 35-40%, definity used; apical akinesis; hypokinesis of   the basal inferior wall; overall moderate LV dysfunction;   moderate LVE; mild LVH; trace AI; mild MR; moderate LAE.  01/04/2018 TTE >> - Left  ventricle: The cavity size was moderately dilated. Wall   thickness was increased in a pattern of mild LVH. Systolic   function was moderately reduced. The estimated ejection fraction   was in the range of 35% to 40%. There is akinesis of the apical   myocardium. There is hypokinesis of the basalinferior myocardium. - Aortic valve: There was trivial regurgitation. - Mitral valve: Calcified annulus. Mildly thickened leaflets.  There was mild regurgitation. - Left atrium: The atrium was moderately dilated. Impressions: - Extremely limited; definity used; apical akinesis; hypokinesis of  the basal inferior wall; overall moderate LV dysfunction;   moderate LVE; mild LVH; trace AI; mild MR; moderate LAE.  CT head 9/21 >> Subacute infarct involving left occipital lobe, left SCA distribution of cerebellum, and right thalamus. Mild increased edema and local mass effect. No herniation or associated hemorrhage.  Micro Data: 12/07/2017>> Recent toe infection>>STAPHYLOCOCCUS AUREUS   9/20 MRSA PCR >> neg  Antimicrobials:  9/20 cefazolin (pre-op)  Subjective:  No events overnight Remains on 3%, Na ranging 150- 144 Net -100, -1138ml/24 hrs Continues off sedation, following commands Currently weaning 10/5   Objective   Blood pressure (!) 157/64, pulse 72, temperature 98.2 F (36.8 C), temperature source Axillary, resp. rate (!) 26, height 5\' 1"  (1.549 m), weight 112.3 kg, SpO2 100 %.    Vent Mode: PCV FiO2 (%):  [30 %] 30 % Set Rate:  [15 bmp] 15 bmp PEEP:  [5 cmH20] 5 cmH20 Pressure Support:  [10 cmH20] 10  cmH20 Plateau Pressure:  [14 cmH20-16 cmH20] 16 cmH20   Intake/Output Summary (Last 24 hours) at 01/03/2018 0811 Last data filed at 01/03/2018 0400 Gross per 24 hour  Intake 1443.38 ml  Output 2550 ml  Net -1106.62 ml   Filed Weights   06-Jan-2018 1130 01/02/18 1100 01/03/18 0500  Weight: 112.3 kg 113 kg 112.3 kg    Examination: General:  Older female on MV in NAD HEENT: MM  pink/moist, ETT 7.5 at 23/24 cm, OGT, pupils 3/reactive Neuro: opens eyes to verbal, f/c- wiggles finger/toes in all extremities, unable to lift to gravity except for minimal in LUE CV: paced, no murmur, +2 pulses, warm PULM: even/non-labored on PS 10/5, lungs bilaterally clear, rales LLL GI: obese, soft, non-tender, bs active, +BM Extremities: warm/dry, trace BLE edema  Skin: no rashes  Resolved Hospital Problem list    Assessment & Plan:   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  Suspect some brain stem infarct per neuro 9/21>> edema on follow up scan w/ left PCA, left SCA and right thalamic infarcts; Na 139>> Started 3% saline at 25 cc/hr for mild cerebral edema - off cleviprex 9/20 - off sedation 9/20 Plan: Neurology primary SBP goal < 160 Ongoing neuro exams, monitor for seizures Further imaging per Neuro Continue asa, brilinta and lipitor  Stop 3% with increasing diuresis, defer further hypertonic tx to neurology  SLP/ PT/ OT when able   Acute Respiratory Insufficiency in setting of Cerebral infarction  - Difficult intubation 9/20 - Weaning 10/5 9/22 - CXR reviewed- stable lines/ ETT; mild pulm congestion, LLL atelectsis, ? Small L effusion Plan: Full MV support, PRVC 8 cc/kg Trend CXR Daily SBT, currently 10/5 Saturation goals are 88-92%, wean PEEP/ FiO2 Albuterol nebs prn RASS Goal 0/-1 Has not needed sedation since 9/20 SLP when extubated Bowel regimen per PAD protocol    Hx. Atrial Fibrillation on Eliquis  Hx. CHF, Pulmonary HTN, Bi Ventricular pacemeaker, PVD - TTE 01/06/18 w/ EF 35-40%;  Limited study; apical akinesis; hypokinesis of  the basal inferior wall; overall moderate LV dysfunction;   moderate LVE; mild LVH; trace  AI; mild MR; moderate LAE. - net -100 ml Plan: Tele monitoring Goal SBP < 160 Defer to Neuro for restarting eliquis Continue home vasotec, coreg Increase lasix to home dose 40mg  BID Continue foley today to monitor diuresis given ongoing AMS Add KCL 40 meq daily  Prn labetalol  Monitor for volume overload/ daily wts/ I/Os Normal lactate Daily renal panel/ mag  Hypocalcemia Plan: trend  On Eliquis at home - normal coags on admit, some question of compliance Leukocytosis- resolved Plan SCDs/ heparin SQ for VTE Defer eliquis to neurology as above Trend CBC Monitor for bleeding Transfuse for HGB < 7  Leukocytosis- resolved  - 12/07/2017>> Recent toe infection + for STAPHYLOCOCCUS AUREUS - PCT  WNL  9/21 - remains afebrile Plan: Trend fever curve and WBC Monitor clinically  DM - HgbA1c 8.4 - ongoing hyperglycemia  P:  CBG q4 SSI resistant Add TF coverage 3units q 4 lantus 10u BID  Hypothyroid - TSH low on admit P:  Continue synthroid po Recheck TSH in 5 weeks  Elevated Triglycerides - Most likely 2/2 Cleviprex/ propofol>> Both now off Plan: lipitor as above Trend   Disposition / Summary  of Today's Plan 01/03/18   Continue SBT Ongoing neuro assessments BP control < 160 Stopping hypertonic saline/ increasing diuresis     Diet: TF  Pain/Anxiety/Delirium protocol : as needed  VAP protocol  DVT prophylaxis: SQ heparin, SCDs GI prophylaxis: Protonix  Hyperglycemia protocol: CBG/ SSI/ lantus Mobility:BR Code Status: FC Family Communication:Updated 9/21. Family at bedside updated 9/22. They state they have had Living Will discussions with patient but want to see what her functional status will be as she continues to recover before making decisions regarding goals of care. No family at bedside on am rounds 9/23.  Labs   CBC: Recent Labs  Lab 2018-01-19 0450 January 19, 2018 0453 01/01/18 0323 01/02/18 0528 01/03/18 0500  WBC 11.6*  --  10.8* 10.0 10.1   NEUTROABS 7.8*  --  8.0*  --   --   HGB 14.2 14.6 10.4* 10.6* 10.3*  HCT 44.0 43.0 34.0* 35.7* 33.8*  MCV 95.0  --  96.3 100.3* 98.3  PLT 378  --  310 265 272   Basic Metabolic Panel: Recent Labs  Lab 12/30/17 1004 2018-01-19 0450 2018/01/19 0453 01/01/18 0323  01/02/18 0816 01/02/18 1430 01/02/18 1700 01/02/18 2006 01/03/18 0209 01/03/18 0500  NA 139 137 138 139   < > 141 142  --  150* 144 144  K 4.8 4.7 4.7 3.6  --  4.3  --   --   --   --  3.7  CL 104 103 105 111  --  114*  --   --   --   --  112*  CO2 28 26  --  18*  --  18*  --   --   --   --  24  GLUCOSE 170* 286* 281* 210*  --  252*  --   --   --   --  267*  BUN 17 20 22 13   --  12  --   --   --   --  19  CREATININE 0.88 0.87 0.80 0.78  --  0.77  --   --   --   --  0.73  CALCIUM 9.6 9.5  --  7.8*  --  8.0*  --   --   --   --  8.4*  MG  --   --   --  1.7  --  2.2 1.9 2.0  --   --  1.9  PHOS  --   --   --  2.7  --   --  1.8* 1.9*  --   --  2.5   < > = values in this interval not displayed.   GFR: Estimated Creatinine Clearance: 76 mL/min (by C-G formula based on SCr of 0.73 mg/dL). Recent Labs  Lab 19-Jan-2018 0450 01/01/18 0323 01/02/18 0528 01/02/18 0816 01/03/18 0500  PROCALCITON  --  <0.10  --  <0.10  --   WBC 11.6* 10.8* 10.0  --  10.1  LATICACIDVEN  --   --  1.3  --   --    Liver Function Tests: Recent Labs  Lab 12/30/17 1004 01/19/18 0450  AST 18 20  ALT 14 17  ALKPHOS  --  50  BILITOT 0.8 0.8  PROT 7.7 8.7*  ALBUMIN  --  4.3   No results for input(s): LIPASE, AMYLASE in the last 168 hours. No results for input(s): AMMONIA in the last 168 hours. ABG    Component Value Date/Time   PHART 7.356 01/01/2018 0333   PCO2ART  35.8 01/01/2018 0333   PO2ART 164.0 (H) 01/01/2018 0333   HCO3 20.1 01/01/2018 0333   TCO2 21 (L) 01/01/2018 0333   ACIDBASEDEF 5.0 (H) 01/01/2018 0333   O2SAT 99.0 01/01/2018 0333    Coagulation Profile: Recent Labs  Lab 01/08/2018 0450 01/01/18 0323  INR 1.12 1.25    Cardiac Enzymes: Recent Labs  Lab 01/01/18 0323 01/03/18 0500  TROPONINI 0.03* 0.03*   HbA1C: Hgb A1c MFr Bld  Date/Time Value Ref Range Status  12/30/2017 10:04 AM 8.4 (H) <5.7 % of total Hgb Final    Comment:    For someone without known diabetes, a hemoglobin A1c value of 6.5% or greater indicates that they may have  diabetes and this should be confirmed with a follow-up  test. . For someone with known diabetes, a value <7% indicates  that their diabetes is well controlled and a value  greater than or equal to 7% indicates suboptimal  control. A1c targets should be individualized based on  duration of diabetes, age, comorbid conditions, and  other considerations. . Currently, no consensus exists regarding use of hemoglobin A1c for diagnosis of diabetes for children. Marland Kitchen.   08/09/2017 09:03 AM 9.2 (H) <5.7 % of total Hgb Final    Comment:    For someone without known diabetes, a hemoglobin A1c value of 6.5% or greater indicates that they may have  diabetes and this should be confirmed with a follow-up  test. . For someone with known diabetes, a value <7% indicates  that their diabetes is well controlled and a value  greater than or equal to 7% indicates suboptimal  control. A1c targets should be individualized based on  duration of diabetes, age, comorbid conditions, and  other considerations. . Currently, no consensus exists regarding use of hemoglobin A1c for diagnosis of diabetes for children. .    CBG: Recent Labs  Lab 01/02/18 1511 01/02/18 1936 01/02/18 2326 01/03/18 0434 01/03/18 0741  GLUCAP 228* 228* 236* 232* 233*    CCT 45 mins  Posey BoyerBrooke Cobain Morici, AGACNP-BC [facility] Pgr: [phone] or if no answer [phone] 01/03/2018, 8:55 AM

## 2018-01-03 NOTE — Progress Notes (Addendum)
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 Corliss Skains. Patient laying in bed intubated. Patient's condition stable- still no movements of RUE, pupils middle fixed position. Right wrist incision c/d/i.   Allergies: Rosiglitazone; Tapentadol; Tape; and Celecoxib  Medications: Prior to Admission medications   Medication Sig Start Date End Date Taking? Authorizing Provider  ALPRAZolam (XANAX) 1 MG tablet TAKE ONE-HALF TO ONE TABLET BY MOUTH TWOTIMES A DAY AS NEEDED FOR ANXIETY Patient taking differently: Take 0.5 mg by mouth 2 (two) times daily as needed. TAKE ONE-HALF TO ONE TABLET BY MOUTH TWOTIMES A DAY AS NEEDED FOR ANXIETY 10/07/17  Yes Campbell Riches, NP  apixaban (ELIQUIS) 5 MG TABS tablet Take 1 tablet (5 mg total) by mouth 2 (two) times daily. 11/05/16  Yes Marinus Maw, MD  carvedilol (COREG) 25 MG tablet TAKE ONE TABLET TWICE DAILY WITH A MEAL Patient taking differently: Take 12.5 mg by mouth 2 (two) times daily with a meal. TAKE ONE TABLET TWICE DAILY WITH A MEAL 11/05/16  Yes Marinus Maw, MD  Cholecalciferol (VITAMIN D3) 5000 units CAPS Take 1 capsule (5,000 Units total) by mouth daily. 08/19/17  Yes Roma Kayser, MD  enalapril (VASOTEC) 10 MG tablet Take 1 tablet (10 mg total) by mouth 2 (two) times daily. Patient taking differently: Take 10 mg by mouth daily.  11/05/16  Yes Marinus Maw, MD  furosemide (LASIX) 40 MG tablet Take 1 tablet (40 mg total) by mouth 2 (two) times daily. 11/05/16  Yes Marinus Maw, MD  insulin lispro (HUMALOG KWIKPEN) 100 UNIT/ML KiwkPen Inject 0.24-0.3 mLs (24-30 Units total) into the skin 3 (three) times daily. 08/19/17  Yes Nida, Denman George, MD    LEVEMIR FLEXTOUCH 100 UNIT/ML Pen INJECT 80 UNITS INTO THE SKIN DAILY AT 10:00PM Patient taking differently: Inject 80 Units into the skin at bedtime.  08/11/17  Yes Nida, Denman George, MD  multivitamin [facility]) per tablet Take 1 tablet by mouth daily.     Yes [provider]  Omega-3 Fatty Acids (FISH OIL) 1000 MG CAPS Take 1,000 mg by mouth daily.   Yes [provider]  potassium chloride SA (K-DUR,KLOR-CON) 20 MEQ tablet Take 1 tablet (20 mEq total) by mouth 2 (two) times daily. 06/10/17  Yes Jonelle Sidle, MD  SYNTHROID 125 MCG tablet TAKE ONE (1) TABLET BY MOUTH EVERY DAY Patient taking differently: Take 125 mcg by mouth daily before breakfast.  10/07/17  Yes Nida, Denman George, MD  traMADol (ULTRAM) 50 MG tablet TAKE ONE TABLET ONCE DAILY FOR MODERATE PAIN Patient taking differently: Take 50 mg by mouth as needed for moderate pain. TAKE ONE TABLET ONCE DAILY FOR MODERATE PAIN 06/11/17  Yes Merlyn Albert, MD  VICTOZA 18 MG/3ML SOPN INJECT 0.3ML'S (1.8MG  TOTAL) INTO THE SKIN ONCE DAILY Patient taking differently: Inject 1.8 mg into the skin daily.  07/12/17  Yes Roma Kayser, MD  vitamin E 600 UNIT capsule Take 600 Units by mouth daily.     Yes [provider]  ACCU-CHEK AVIVA PLUS test strip USE TO TEST GLUCOSE 4 TIMES A DAY 12/27/17   Roma Kayser, MD  Blood Glucose Monitoring Suppl (ACCU-CHEK AVIVA) device Use as instructed 05/06/15   Roma Kayser, MD  Continuous Blood Gluc Sensor (FREESTYLE LIBRE SENSOR SYSTEM) MISC Use one sensor every 10 days. 12/24/16   Roma Kayser, MD  glucose blood test strip Accu-Chek Aviva Plus test strips    [provider]  Insulin Pen Needle (B-D ULTRAFINE III SHORT PEN) 31G X 8 MM MISC Use 5 x daily as directed 09/02/16   Roma Kayser, MD  OVER THE COUNTER MEDICATION Take 1 tablet by mouth daily. Phyomega vitamin.    [provider]     Vital Signs: BP (!)  157/83   Pulse 67   Temp 99.5 F (37.5 C) (Axillary)   Resp (!) 21   Ht 5\' 1"  (1.549 m)   Wt 247 lb 9.2 oz (112.3 kg)   LMP  (LMP Unknown)   SpO2 99%   BMI 46.78 kg/m   Physical Exam  Constitutional: She appears well-developed and well-nourished. No distress.  Intubated.  Pulmonary/Chest: Effort normal. No respiratory distress.  Intubated.  Neurological:  Intubated. Speech and comprehension not assessed. PERRL bilaterally with pupils fixed middle position. Visual fields not assessed. Facial asymmetry unable to assess due to ET tube. Unable to protrude tongue. Can spontaneously move left side and RLE, unable to move RUE, positive Babinski bilaterally. No pronator drift not assessed. Fine motor and coordination not assessed. Gait not assessed. Romberg not assessed. Heel to toe not assessed. Distal pulses 1+ bilaterally.  Skin: Skin is warm and dry.  Right wrist incision soft without active bleeding or hematoma.  Psychiatric:  Intubated.  Nursing note and vitals reviewed.   Imaging: Ct Angio Head W Or Wo Contrast  Result Date: 2018/01/12 CLINICAL DATA:  Initial evaluation for right-sided neglect, slurred speech. EXAM: CT ANGIOGRAPHY HEAD AND NECK CT PERFUSION BRAIN TECHNIQUE: Multidetector CT imaging of the head and neck was performed using the standard protocol during bolus administration of intravenous contrast. Multiplanar CT image reconstructions and MIPs were obtained to evaluate the vascular anatomy. Carotid stenosis measurements (when applicable) are obtained utilizing NASCET criteria, using the distal internal carotid diameter as the denominator. Multiphase CT imaging of the brain was performed following IV bolus contrast injection. Subsequent parametric perfusion maps were calculated using RAPID software. CONTRAST:  ISOVUE-370 IOPAMIDOL (ISOVUE-370) INJECTION 76% COMPARISON:  None available at time of this dictation. FINDINGS: CTA NECK FINDINGS Aortic arch:  Examination technically limited by motion artifact. Visualized aortic arch of normal caliber with normal branch pattern. Moderate atherosclerotic change about the aortic arch and origin of the great vessels. Approximate 60% stenosis at the origin of the left subclavian artery. No other flow-limiting stenosis about the origin of the great vessels. Visualized subclavian arteries otherwise grossly patent. Right carotid system: Focal plaque at the origin of the right common carotid artery with associated 60% stenosis. Right common carotid artery otherwise patent to the bifurcation without flow-limiting stenosis. Extensive plaque about the right bifurcation/proximal right ICA. Resultant fairly severe stenosis of up to approximately 70-80%, although exact termination difficult given prominent calcifications and motion artifact within this region. Right ICA patent distally to the skull base. Left carotid system: Left common carotid artery patent from its origin to the bifurcation without stenosis. Prominent calcification about the left bifurcation/proximal left ICA with associated stenosis of up to approximately 75% by NASCET criteria, although again, evaluation limited. Left ICA patent distally to the skull base without stenosis, dissection or occlusion. Vertebral arteries: Both of the vertebral arteries arise from the subclavian arteries. Right vertebral artery dominant. Focal plaque at the origin of the right vertebral  artery with suspected approximate 50% stenosis. Right vertebral artery otherwise patent to the skull base. Left vertebral artery somewhat diminutive. Left V1 and V2 segments patent proximally. There is poor opacification of the left V2 segment at the level of V3 (series 7, image 193), which may be occluded. Contrast opacification is seen distally at the left V3 segment, with the left vert appearing patent as it courses into the cranial vault. Skeleton: No acute osseus abnormality. No discrete osseous  lesions. Moderate cervical spondylolysis at C5-6 and C6-7. Other neck: No acute soft tissue abnormality within the neck. Upper chest: Left-sided pacemaker/AICD in place. Sequelae of prior CABG. Cardiomegaly partially visualized. Patchy multifocal consolidative opacities within the visualized lungs, concerning for pneumonia. Underlying pulmonary interstitial edema. Enlarged subcarinal nodal conglomerate measures 2.2 cm, which may be reactive. Review of the MIP images confirms the above findings CTA HEAD FINDINGS Anterior circulation: Petrous segments patent bilaterally. Extensive calcified plaque throughout the carotid siphons with moderate diffuse narrowing, up to approximately 50%. ICA termini patent. A1 segments patent bilaterally. Left A1 hypoplastic, likely accounting for the diminutive left ICA is compared to the right. Normal anterior communicating artery. Anterior cerebral arteries patent to their distal aspects without stenosis. Left M1 patent without stenosis. Normal left MCA bifurcation. No proximal left M2 occlusion. Distal left MCA branches well perfused. Right M1 widely patent. Normal right MCA bifurcation. No proximal right M2 occlusion. Distal right MCA branches well perfused. Posterior circulation: Scattered non stenotic plaque within the right V4 segment. Right vertebral patent to the vertebrobasilar junction without stenosis. Focal approximate 60% stenosis within the left V4 segment as it penetrates the dural reflection. Moderate multifocal stenoses seen distally within the left V4 segment. Posterior inferior cerebral arteries patent bilaterally. Basilar patent proximally. There is occlusion distally (series 7, image 112), which remains occluded to the basilar tip. Superior cerebellar arteries not seen. There is perfusion of the PCAs bilateral, likely via small posterior communicating arteries. Severe proximal right P2 stenosis (series 10, image 21). Multifocal atheromatous irregularity seen  throughout the PCAs distally. Venous sinuses: Patent. Anatomic variants: Small bilateral posterior communicating arteries. Dominant right vertebral artery. Delayed phase: Not performed. Review of the MIP images confirms the above findings CT Brain Perfusion Findings: CBF (<30%) Volume: 0mL Perfusion (Tmax>6.0s) volume: Mismatch Volume: Infinite. Infarction Location:Negative infarct by CT perfusion. 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 IMPRESSION: 1. 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 Superior cerebellar arteries not visualized. PCAs are perfused at this time via collateralization from small posterior communicating arteries. 2. No acute core infarct identified by CT perfusion. 3. Approximate 50% stenosis at the origin of the right vertebral artery. Right vertebral artery dominant and otherwise widely patent to the vertebrobasilar junction. 4. Poor opacification of the distal left V2 segment, which may be occluded. Reconstitution at the V3 segment, which remains patent to the cranial vault. Multifocal moderate V4 stenoses as above. 5. Atheromatous stenoses of up to approximately 70-80% about the carotid bifurcations bilaterally. Evaluation limited by prominent calcifications and motion artifact on this exam. 6. 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 7. 60% stenosis at the origin of the left subclavian artery. 8. Patchy multifocal consolidative opacities within the visualized lungs, concerning for pneumonia. Evidence for underlying pulmonary interstitial edema. Critical Value/emergent results were called by telephone at the time of interpretation on 01/04/2018 at 5:26 am to Dr. Jaci Carrel , who verbally acknowledged these results. Electronically Signed  By: Rise Mu M.D.   On: 01-09-2018 05:47   Dg Chest 1  View  Result Date: 01/09/2018 CLINICAL DATA:  CVA.  Hypoxia. EXAM: CHEST  1 VIEW COMPARISON:  June 22, 2016 FINDINGS: Endotracheal tube tip is 2.8 cm above the carina. Nasogastric tube tip and side port are below the diaphragm. Pacemaker leads are attached the right atrium, right ventricle, and coronary sinus. No pneumothorax. : There is atelectatic change in the left base. There is hazy opacity in the perihilar regions, likely representing localized edema. Lungs elsewhere clear. There is cardiomegaly with pulmonary venous hypertension. Patient is status post coronary artery bypass grafting. No adenopathy. No bone lesions. IMPRESSION: Tube positions as described without pneumothorax. Pulmonary vascular congestion. Mild perihilar pulmonary edema. No frank consolidation. Left base atelectasis noted. Postoperative changes noted. Electronically Signed   By: Bretta Bang III M.D.   On: January 09, 2018 12:16   Ct Head Wo Contrast  Result Date: 01/01/2018 CLINICAL DATA:  72 y/o  F; stroke for follow-up. EXAM: CT HEAD WITHOUT CONTRAST TECHNIQUE: Contiguous axial images were obtained from the base of the skull through the vertex without intravenous contrast. COMPARISON:  01-09-2018 CT head, CTA head, CT perfusion head. FINDINGS: Brain: Subacute infarct involving the left occipital lobe, left SCA distribution of cerebellum, and right thalamus with mild edema and local mass effect. No associated hemorrhage. No additional focus of stroke, hemorrhage, or focal mass effect identified. No extra-axial collection, hydrocephalus, or effacement of basilar cisterns. Stable background of chronic microvascular ischemic changes and volume loss of the brain. Vascular: Tip of basilar to right PCA stent in situ. Skull: Normal. Negative for fracture or focal lesion. Sinuses/Orbits: No acute finding. Other: Bilateral intra-ocular lens replacement. IMPRESSION: Subacute infarct involving left occipital lobe, left SCA distribution of  cerebellum, and right thalamus. Mild increased edema and local mass effect. No herniation or associated hemorrhage. Electronically Signed   By: Mitzi Hansen M.D.   On: 01/01/2018 02:36   Ct Head Wo Contrast  Result Date: 09-Jan-2018 CLINICAL DATA:  Status post revascularization of distal basilar occlusion with rescue stent placed the mid basilar artery into the right posterior cerebral artery. The left posterior cerebral artery remains occluded. EXAM: CT HEAD WITHOUT CONTRAST TECHNIQUE: Contiguous axial images were obtained from the base of the skull through the vertex without intravenous contrast. COMPARISON:  CT perfusion and CT angiogram of the same day. FINDINGS: Brain: There is loss of gray-white differentiation at the left occipital pole and along the medial left occipital lobe. Inferior left occipital lobe infarct is noted. No associated hemorrhage is present. Although there is white matter disease on the right, no definite cortical infarct is present in the right occipital lobe. Basal ganglia are intact. Insular ribbon is normal bilaterally. Basilar artery stent extending into the right PCA is noted. There is no hyperdense vessel in the posterior circulation. Ventricles are proportionate to the degree of atrophy. No significant extra-axial fluid collection is present. A cavum septum pellucidum is incidentally noted. Vascular: Basilar artery stent in place. Atherosclerotic calcifications are present in the cavernous internal carotid arteries and at the dural margin of both vertebral arteries without significant stenosis. Skull: The skull base is within normal limits. No focal lytic or blastic lesions are present. Sinuses/Orbits: Mucosal thickening is present in the anterior ethmoid air cells with minimal fluid in the frontal sinuses bilaterally. The remaining paranasal sinuses are clear. The mastoid air cells are clear. The patient is intubated. Bilateral lens replacements are present. Globes  and  orbits are otherwise within normal limits. IMPRESSION: 1. Involving nonhemorrhagic infarct involving the left occipital lobe is described. This is consistent with acute left PCA occlusion. 2. No other acute or focal cortical or basal ganglia infarct evident. 3. Moderate diffuse white matter disease. 4. Anterior ethmoid and frontal sinus disease may be related to intubation. Electronically Signed   By: Marin Roberts M.D.   On: 01/05/2018 12:22   Ct Angio Neck W And/or Wo Contrast  Result Date: 01/03/2018 CLINICAL DATA:  Initial evaluation for right-sided neglect, slurred speech. EXAM: CT ANGIOGRAPHY HEAD AND NECK CT PERFUSION BRAIN TECHNIQUE: Multidetector CT imaging of the head and neck was performed using the standard protocol during bolus administration of intravenous contrast. Multiplanar CT image reconstructions and MIPs were obtained to evaluate the vascular anatomy. Carotid stenosis measurements (when applicable) are obtained utilizing NASCET criteria, using the distal internal carotid diameter as the denominator. Multiphase CT imaging of the brain was performed following IV bolus contrast injection. Subsequent parametric perfusion maps were calculated using RAPID software. CONTRAST:  ISOVUE-370 IOPAMIDOL (ISOVUE-370) INJECTION 76% COMPARISON:  None available at time of this dictation. FINDINGS: CTA NECK FINDINGS Aortic arch: Examination technically limited by motion artifact. Visualized aortic arch of normal caliber with normal branch pattern. Moderate atherosclerotic change about the aortic arch and origin of the great vessels. Approximate 60% stenosis at the origin of the left subclavian artery. No other flow-limiting stenosis about the origin of the great vessels. Visualized subclavian arteries otherwise grossly patent. Right carotid system: Focal plaque at the origin of the right common carotid artery with associated 60% stenosis. Right common carotid artery otherwise patent to the  bifurcation without flow-limiting stenosis. Extensive plaque about the right bifurcation/proximal right ICA. Resultant fairly severe stenosis of up to approximately 70-80%, although exact termination difficult given prominent calcifications and motion artifact within this region. Right ICA patent distally to the skull base. Left carotid system: Left common carotid artery patent from its origin to the bifurcation without stenosis. Prominent calcification about the left bifurcation/proximal left ICA with associated stenosis of up to approximately 75% by NASCET criteria, although again, evaluation limited. Left ICA patent distally to the skull base without stenosis, dissection or occlusion. Vertebral arteries: Both of the vertebral arteries arise from the subclavian arteries. Right vertebral artery dominant. Focal plaque at the origin of the right vertebral artery with suspected approximate 50% stenosis. Right vertebral artery otherwise patent to the skull base. Left vertebral artery somewhat diminutive. Left V1 and V2 segments patent proximally. There is poor opacification of the left V2 segment at the level of V3 (series 7, image 193), which may be occluded. Contrast opacification is seen distally at the left V3 segment, with the left vert appearing patent as it courses into the cranial vault. Skeleton: No acute osseus abnormality. No discrete osseous lesions. Moderate cervical spondylolysis at C5-6 and C6-7. Other neck: No acute soft tissue abnormality within the neck. Upper chest: Left-sided pacemaker/AICD in place. Sequelae of prior CABG. Cardiomegaly partially visualized. Patchy multifocal consolidative opacities within the visualized lungs, concerning for pneumonia. Underlying pulmonary interstitial edema. Enlarged subcarinal nodal conglomerate measures 2.2 cm, which may be reactive. Review of the MIP images confirms the above findings CTA HEAD FINDINGS Anterior circulation: Petrous segments patent bilaterally.  Extensive calcified plaque throughout the carotid siphons with moderate diffuse narrowing, up to approximately 50%. ICA termini patent. A1 segments patent bilaterally. Left A1 hypoplastic, likely accounting for the diminutive left ICA is compared to the right. Normal anterior communicating artery.  Anterior cerebral arteries patent to their distal aspects without stenosis. Left M1 patent without stenosis. Normal left MCA bifurcation. No proximal left M2 occlusion. Distal left MCA branches well perfused. Right M1 widely patent. Normal right MCA bifurcation. No proximal right M2 occlusion. Distal right MCA branches well perfused. Posterior circulation: Scattered non stenotic plaque within the right V4 segment. Right vertebral patent to the vertebrobasilar junction without stenosis. Focal approximate 60% stenosis within the left V4 segment as it penetrates the dural reflection. Moderate multifocal stenoses seen distally within the left V4 segment. Posterior inferior cerebral arteries patent bilaterally. Basilar patent proximally. There is occlusion distally (series 7, image 112), which remains occluded to the basilar tip. Superior cerebellar arteries not seen. There is perfusion of the PCAs bilateral, likely via small posterior communicating arteries. Severe proximal right P2 stenosis (series 10, image 21). Multifocal atheromatous irregularity seen throughout the PCAs distally. Venous sinuses: Patent. Anatomic variants: Small bilateral posterior communicating arteries. Dominant right vertebral artery. Delayed phase: Not performed. Review of the MIP images confirms the above findings CT Brain Perfusion Findings: CBF (<30%) Volume: 0mL Perfusion (Tmax>6.0s) volume: Mismatch Volume: Infinite. Infarction Location:Negative infarct by CT perfusion. 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 IMPRESSION: 1. Positive CTA for EVLO with occlusive  thrombus within the distal basilar artery. Superior cerebellar arteries not visualized. PCAs are perfused at this time via collateralization from small posterior communicating arteries. 2. No acute core infarct identified by CT perfusion. 3. Approximate 50% stenosis at the origin of the right vertebral artery. Right vertebral artery dominant and otherwise widely patent to the vertebrobasilar junction. 4. Poor opacification of the distal left V2 segment, which may be occluded. Reconstitution at the V3 segment, which remains patent to the cranial vault. Multifocal moderate V4 stenoses as above. 5. Atheromatous stenoses of up to approximately 70-80% about the carotid bifurcations bilaterally. Evaluation limited by prominent calcifications and motion artifact on this exam. 6. 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 7. 60% stenosis at the origin of the left subclavian artery. 8. Patchy multifocal consolidative opacities within the visualized lungs, concerning for pneumonia. Evidence for underlying pulmonary interstitial edema. Critical Value/emergent results were called by telephone at the time of interpretation on 01/02/2018 at 5:26 am to Dr. Jaci Carrel , who verbally acknowledged these results. Electronically Signed   By: Rise Mu M.D.   On: 12/19/2017 05:47   Ct Cerebral Perfusion W Contrast  Result Date: 12/15/2017 CLINICAL DATA:  Initial evaluation for right-sided neglect, slurred speech. EXAM: CT ANGIOGRAPHY HEAD AND NECK CT PERFUSION BRAIN TECHNIQUE: Multidetector CT imaging of the head and neck was performed using the standard protocol during bolus administration of intravenous contrast. Multiplanar CT image reconstructions and MIPs were obtained to evaluate the vascular anatomy. Carotid stenosis measurements (when applicable) are obtained utilizing NASCET criteria, using the distal internal carotid diameter as the denominator. Multiphase CT  imaging of the brain was performed following IV bolus contrast injection. Subsequent parametric perfusion maps were calculated using RAPID software. CONTRAST:  ISOVUE-370 IOPAMIDOL (ISOVUE-370) INJECTION 76% COMPARISON:  None available at time of this dictation. FINDINGS: CTA NECK FINDINGS Aortic arch: Examination technically limited by motion artifact. Visualized aortic arch of normal caliber with normal branch pattern. Moderate atherosclerotic change about the aortic arch and origin of the great vessels. Approximate 60% stenosis at the origin of the left subclavian artery. No other flow-limiting stenosis about the origin of the great vessels. Visualized subclavian arteries otherwise grossly  patent. Right carotid system: Focal plaque at the origin of the right common carotid artery with associated 60% stenosis. Right common carotid artery otherwise patent to the bifurcation without flow-limiting stenosis. Extensive plaque about the right bifurcation/proximal right ICA. Resultant fairly severe stenosis of up to approximately 70-80%, although exact termination difficult given prominent calcifications and motion artifact within this region. Right ICA patent distally to the skull base. Left carotid system: Left common carotid artery patent from its origin to the bifurcation without stenosis. Prominent calcification about the left bifurcation/proximal left ICA with associated stenosis of up to approximately 75% by NASCET criteria, although again, evaluation limited. Left ICA patent distally to the skull base without stenosis, dissection or occlusion. Vertebral arteries: Both of the vertebral arteries arise from the subclavian arteries. Right vertebral artery dominant. Focal plaque at the origin of the right vertebral artery with suspected approximate 50% stenosis. Right vertebral artery otherwise patent to the skull base. Left vertebral artery somewhat diminutive. Left V1 and V2 segments patent proximally. There  is poor opacification of the left V2 segment at the level of V3 (series 7, image 193), which may be occluded. Contrast opacification is seen distally at the left V3 segment, with the left vert appearing patent as it courses into the cranial vault. Skeleton: No acute osseus abnormality. No discrete osseous lesions. Moderate cervical spondylolysis at C5-6 and C6-7. Other neck: No acute soft tissue abnormality within the neck. Upper chest: Left-sided pacemaker/AICD in place. Sequelae of prior CABG. Cardiomegaly partially visualized. Patchy multifocal consolidative opacities within the visualized lungs, concerning for pneumonia. Underlying pulmonary interstitial edema. Enlarged subcarinal nodal conglomerate measures 2.2 cm, which may be reactive. Review of the MIP images confirms the above findings CTA HEAD FINDINGS Anterior circulation: Petrous segments patent bilaterally. Extensive calcified plaque throughout the carotid siphons with moderate diffuse narrowing, up to approximately 50%. ICA termini patent. A1 segments patent bilaterally. Left A1 hypoplastic, likely accounting for the diminutive left ICA is compared to the right. Normal anterior communicating artery. Anterior cerebral arteries patent to their distal aspects without stenosis. Left M1 patent without stenosis. Normal left MCA bifurcation. No proximal left M2 occlusion. Distal left MCA branches well perfused. Right M1 widely patent. Normal right MCA bifurcation. No proximal right M2 occlusion. Distal right MCA branches well perfused. Posterior circulation: Scattered non stenotic plaque within the right V4 segment. Right vertebral patent to the vertebrobasilar junction without stenosis. Focal approximate 60% stenosis within the left V4 segment as it penetrates the dural reflection. Moderate multifocal stenoses seen distally within the left V4 segment. Posterior inferior cerebral arteries patent bilaterally. Basilar patent proximally. There is occlusion  distally (series 7, image 112), which remains occluded to the basilar tip. Superior cerebellar arteries not seen. There is perfusion of the PCAs bilateral, likely via small posterior communicating arteries. Severe proximal right P2 stenosis (series 10, image 21). Multifocal atheromatous irregularity seen throughout the PCAs distally. Venous sinuses: Patent. Anatomic variants: Small bilateral posterior communicating arteries. Dominant right vertebral artery. Delayed phase: Not performed. Review of the MIP images confirms the above findings CT Brain Perfusion Findings: CBF (<30%) Volume: 0mL Perfusion (Tmax>6.0s) volume: Mismatch Volume: Infinite. Infarction Location:Negative infarct by CT perfusion. 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 IMPRESSION: 1. 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 Superior cerebellar arteries not visualized. PCAs are perfused at this time via collateralization from small posterior communicating arteries. 2. No acute core infarct identified by CT perfusion. 3.  Approximate 50% stenosis at the origin of the right vertebral artery. Right vertebral artery dominant and otherwise widely patent to the vertebrobasilar junction. 4. Poor opacification of the distal left V2 segment, which may be occluded. Reconstitution at the V3 segment, which remains patent to the cranial vault. Multifocal moderate V4 stenoses as above. 5. Atheromatous stenoses of up to approximately 70-80% about the carotid bifurcations bilaterally. Evaluation limited by prominent calcifications and motion artifact on this exam. 6. 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 7. 60% stenosis at the origin of the left subclavian artery. 8. Patchy multifocal consolidative opacities within the visualized lungs, concerning for pneumonia. Evidence for underlying pulmonary  interstitial edema. Critical Value/emergent results were called by telephone at the time of interpretation on Jan 06, 2018 at 5:26 am to Dr. Jaci Carrel , who verbally acknowledged these results. Electronically Signed   By: Rise Mu M.D.   On: 06-Jan-2018 05:47   Dg Chest Port 1 View  Result Date: 01/03/2018 CLINICAL DATA:  Endotracheal tube.  Cerebral infarction. EXAM: PORTABLE CHEST 1 VIEW COMPARISON:  01/02/2018 FINDINGS: Endotracheal tube terminates 2.7 cm above carina.Nasogastric tube extends beyond the inferior aspect of the film. Pacer/AICD device. Prior median sternotomy for CABG. Cardiomegaly accentuated by AP portable technique. Probable layering left pleural effusion. No pneumothorax. Pulmonary venous congestion, superimposed upon low lung volumes. Developing left base atelectasis. IMPRESSION: Similar cardiomegaly and mild pulmonary venous congestion. Developing left base atelectasis and possible small left pleural effusion. Electronically Signed   By: Jeronimo Greaves M.D.   On: 01/03/2018 08:08   Dg Chest Port 1 View  Result Date: 01/02/2018 CLINICAL DATA:  Respiratory failure.  Follow-up exam. EXAM: PORTABLE CHEST 1 VIEW COMPARISON:  01/01/2018 and older studies. FINDINGS: Endotracheal tube, nasal/orogastric tube and right PICC stable and well positioned. No change in the left anterior chest wall biventricular cardioverter-defibrillator. Stable changes from prior CABG surgery. Lungs demonstrate vascular congestion and chronic interstitial thickening without overt pulmonary edema. Additional basilar opacity is atelectasis. No pneumothorax. IMPRESSION: 1. No significant change from the previous day's study. 2. Chronic interstitial thickening with vascular congestion but no overt pulmonary edema. Mild basilar atelectasis. 3. Support apparatus is stable and well positioned Electronically Signed   By: Amie Portland M.D.   On: 01/02/2018 07:46   Dg Chest Port 1 View  Result Date:  01/01/2018 CLINICAL DATA:  PICC placement. EXAM: PORTABLE CHEST 1 VIEW COMPARISON:  01/01/2018. FINDINGS: Interval right PICC with its tip obscured by left subclavian pacer and AICD leads, approximately 5 cm above the superior cavoatrial junction. Endotracheal tube tip 3 cm above the carina. Nasogastric tube extending into the upper abdomen. Stable enlarged cardiac silhouette and post CABG changes. The interstitial markings remain diffusely prominent with improved aeration at the lung bases. IMPRESSION: 1. Right PICC tip obscured by left subclavian pacer and AICD leads, approximately 5 cm above the superior cavoatrial junction. 2. Stable cardiomegaly and chronic interstitial lung disease. 3. Improved bibasilar atelectasis. Electronically Signed   By: Beckie Salts M.D.   On: 01/01/2018 18:57   Dg Chest Port 1 View  Result Date: 01/01/2018 CLINICAL DATA:  Respiratory failure EXAM: PORTABLE CHEST 1 VIEW COMPARISON:  01/06/2018 FINDINGS: Endotracheal tube terminates 3.5 cm above the carina. Mild perihilar edema, right lower lobe predominant. Less likely, multifocal right lung infection is possible. No definite pleural effusions. No pneumothorax. Heart is top-normal in size. Postsurgical changes related to prior CABG. Left subclavian ICD. Enteric tube courses below the diaphragm. IMPRESSION: Endotracheal tube terminates  3.5 cm above the carina. Suspected mild perihilar edema, right lower lobe predominant. Less likely, multifocal right lung infection is possible. Postoperative changes with additional support apparatus as above. Electronically Signed   By: Charline Bills M.D.   On: 01/01/2018 07:07   [place] Ekg Site Rite  Result Date: 01/01/2018 If Site Rite image not attached, placement could not be confirmed due to current cardiac rhythm.  [place] Ekg Site Rite  Result Date: 01/01/2018 If Site Rite image not attached, placement could not be confirmed due to current cardiac rhythm.   Labs:  CBC: Recent Labs     01/02/2018 0450 12/20/2017 0453 01/01/18 0323 01/02/18 0528 01/03/18 0500  WBC 11.6*  --  10.8* 10.0 10.1  HGB 14.2 14.6 10.4* 10.6* 10.3*  HCT 44.0 43.0 34.0* 35.7* 33.8*  PLT 378  --  310 265 272    COAGS: Recent Labs    12/13/2017 0450 01/01/18 0323  INR 1.12 1.25  APTT 31  --     BMP: Recent Labs    12/17/2017 0450 01/01/2018 0453 01/01/18 0323  01/02/18 0816 01/02/18 1430 01/02/18 2006 01/03/18 0209 01/03/18 0500  NA 137 138 139   < > 141 142 150* 144 144  K 4.7 4.7 3.6  --  4.3  --   --   --  3.7  CL 103 105 111  --  114*  --   --   --  112*  CO2 26  --  18*  --  18*  --   --   --  24  GLUCOSE 286* 281* 210*  --  252*  --   --   --  267*  BUN 20 22 13   --  12  --   --   --  19  CALCIUM 9.5  --  7.8*  --  8.0*  --   --   --  8.4*  CREATININE 0.87 0.80 0.78  --  0.77  --   --   --  0.73  GFRNONAA >60  --  >60  --  >60  --   --   --  >60  GFRAA >60  --  >60  --  >60  --   --   --  >60   < > = values in this interval not displayed.    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 Corliss Skains. Patient's condition stable- still no movements of RUE, pupils middle fixed position. Right wrist incision stable. Appreciate and agree with neurology management. Please call IR with questions/concerns.   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

## 2018-01-03 NOTE — Progress Notes (Signed)
eLink Physician-Brief Progress Note Patient Name: [name] [name] DOB: May 31, 1945 MRN: [id number]   Date of Service  01/03/2018  HPI/Events of Note  Troponin = 0.03. Isolated result. Not sure why drawn.   eICU Interventions  Will continue to trend Troponin.      Intervention Category Intermediate Interventions: Diagnostic test evaluation  Sommer,Steven Dennard Nipugene 01/03/2018, 6:41 AM

## 2018-01-03 NOTE — Consult Note (Signed)
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 Patient has [facility] followed [facility] Lar[facility] AppleM. [facility] Wound type: Full thickness neuropathic Pressure Injury POA: NA Measurement:1.5cm round x 0.2cm Wound bed:60% pink, 40% yellow/brown slough Drainage (amount, consistency, odor) moist, small to moderate amount. Periwound: intact, no erythema, warmth or edema at this time.  Son reports that this has improved since admission over the weekend. Dressing procedure/placement/frequency: I will continue the POC of Dr. Leanord Hawkingobson, using a daily dressing change of silver hydrofiber covered with dry dressing and secured with tape.  I will provide bilateral pressure redistribution heel boots and ask for a sacral silicone prophylactic foam dressing for pressure injury prevention purposes.  WOC nursing team will not follow, but will remain available to this patient, the nursing and medical teams.  Please re-consult if needed. Thanks, Ladona MowLaurie Janyce Ellinger, MSN, RN, GNP, Hans EdenCWOCN, CWON-AP, FAAN  Pager# [phone]

## 2018-01-03 NOTE — Progress Notes (Signed)
Inpatient Diabetes Program Recommendations  AACE/ADA: New Consensus Statement on Inpatient Glycemic Control (2015)  Target Ranges:  Prepandial:   less than 140 mg/dL      Peak postprandial:   less than 180 mg/dL (1-2 hours)      Critically ill patients:  140 - 180 mg/dL   Lab Results  Component Value Date   GLUCAP 233 (H) 01/03/2018   HGBA1C 8.4 (H) 12/30/2017     Review of Glycemic ControlResults for [name] [name], [name] (MRN [id number]) as of 01/03/2018 10:13  Ref. Range 01/02/2018 15:11 01/02/2018 19:36 01/02/2018 23:26 01/03/2018 04:34 01/03/2018 07:41  Glucose-Capillary Latest Ref Range: 70 - 99 mg/dL 409228 (H) 811228 (H) 914236 (H) 232 (H) 233 (H)   Diabetes history:DM2 Outpatient Diabetes medications: Levemir 80 units QHS, Humalog 24-30 units TID with meals, Victoza 1.8 mg daily Current orders for Inpatient glycemic control: Lantus 15 units bid, Novolog 0-20 units Q4H, Novolog 3 units q 4 hours (tube feed coverage) Inpatient Diabetes Program Recommendations:  Note that tube feed coverage added today.  May consider increasing Lantus to 20 units bid.  If blood sugars remain > goal, may also need to increase tube feed coverage.   Thanks Beryl MeagerJenny Shulamit Donofrio, RN, BC-ADM Inpatient Diabetes Coordinator Pager [phone] (8a-5p)

## 2018-01-03 NOTE — Progress Notes (Signed)
Nutrition Follow-up  DOCUMENTATION CODES:   Morbid obesity  INTERVENTION:   Continue:  Vital High Protein @ 40 ml/hr (960 ml/day) via OGT 30 ml Prostat BID MVI daily  Provides: 1160 kcal, 114 grams protein, 107 grams of carbohydrate, and 802 ml free water.    NUTRITION DIAGNOSIS:   Inadequate oral intake related to inability to eat as evidenced by NPO status. 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 Pt s/p revascularization today.    Pt discussed during ICU rounds and with RN.  OG tube  Off 3% Pt weaning, no sedation. Per RN insulin being adjusted due to elevated blood sugars  Patient is currently intubated on ventilator support MV: 10.6 L/min Temp (24hrs), Avg:98.6 F (37 C), Min:97.7 F (36.5 C), Max:99.5 F (37.5 C)  Medications reviewed and include: SSI with meals, lasix, 3 units novolog every 4 hours, 15 units lantus BID, 40 mEq KCl daily Labs reviewed:  CBG: 232-233 Lab Results  Component Value Date   HGBA1C 8.4 (H) 12/30/2017  BP: 128/87 MAP: 105    Diet Order:   Diet Order            Diet NPO time specified  Diet effective now              EDUCATION NEEDS:   No education needs have been identified at this time  Skin:  Skin Assessment: Reviewed RN Assessment(groin incision)  Last BM:  9/23 small  Height:   Ht Readings from Last 1 Encounters:  12/23/2017 '5\' 1"'  (1.549 m)    Weight:   Wt Readings from Last 1 Encounters:  01/03/18 112.3 kg    Ideal Body Weight:  47.7 kg  BMI:  Body mass index is 46.78 kg/m.  Estimated Nutritional Needs:   Kcal:  5732-2025  Protein:  95-119 grams   Fluid:  > 1.5 L/day  Maylon Peppers RD, LDN, CNSC [phone] Pager [phone] After  Hours Pager

## 2018-01-03 NOTE — Plan of Care (Signed)
Pt continues to be ventilated but weaned for nine hours today.  Will continue to monitor  Heloise PurpuraSusan Sissy Goetzke RN

## 2018-01-03 NOTE — Progress Notes (Signed)
CRITICAL VALUE ALERT  Critical Value:  Troponin 0.03  Date & Time Notied:  9/23 16100638  Provider Not[facility]  Orders Received/Actions taken: repeat troponin labs ordered

## 2018-01-03 NOTE — Progress Notes (Signed)
OT Cancellation Note  Patient Details Name: [name] [name] MRN: [id number] DOB: [birth year]   Cancelled Treatment:    Reason Eval/Treat Not Completed: Other (comment). OT order discontinued. Please reorder OT when appropriate for therapy. Thanks.   Thornell MuleWARD,HILLARY  Nolberto Cheuvront, OT/L   Acute OT Clinical Specialist Acute Rehabilitation Services Pager [phone] Office [phone]  01/03/2018, 3:27 PM

## 2018-01-04 ENCOUNTER — Encounter (HOSPITAL_COMMUNITY): Payer: Self-pay | Admitting: Interventional Radiology

## 2018-01-04 ENCOUNTER — Inpatient Hospital Stay (HOSPITAL_COMMUNITY): Payer: Medicare Other

## 2018-01-04 LAB — BASIC METABOLIC PANEL
Anion gap: 9 (ref 5–15)
BUN: 22 mg/dL (ref 8–23)
CO2: 26 mmol/L (ref 22–32)
Calcium: 8.7 mg/dL — ABNORMAL LOW (ref 8.9–10.3)
Chloride: 109 mmol/L (ref 98–111)
Creatinine, Ser: 0.73 mg/dL (ref 0.44–1.00)
GFR calc Af Amer: >60 mL/min (ref >60)
GFR calc non Af Amer: >60 mL/min (ref >60)
Glucose, Bld: 197 mg/dL — ABNORMAL HIGH (ref 70–99)
POTASSIUM: 3.1 mmol/L — AB (ref 3.5–5.1)
Sodium: 144 mmol/L (ref 135–145)

## 2018-01-04 LAB — GLUCOSE, CAPILLARY
GLUCOSE-CAPILLARY: 146 mg/dL — AB (ref 70–99)
Glucose-Capillary: 205 mg/dL — ABNORMAL HIGH (ref 70–99)
Glucose-Capillary: 180 mg/dL — ABNORMAL HIGH (ref 70–99)
Glucose-Capillary: 191 mg/dL — ABNORMAL HIGH (ref 70–99)
Glucose-Capillary: 204 mg/dL — ABNORMAL HIGH (ref 70–99)

## 2018-01-04 LAB — POCT I-STAT 3, ART BLOOD GAS (G3+)
Acid-Base Excess: 2 mmol/L (ref 0.0–2.0)
BICARBONATE: 25.3 mmol/L (ref 20.0–28.0)
O2 Saturation: 98 %
TCO2: 26 mmol/L (ref 22–32)
pCO2 arterial: 36.6 mmHg (ref 32.0–48.0)
pH, Arterial: 7.451 — ABNORMAL HIGH (ref 7.350–7.450)
pO2, Arterial: 98 mmHg (ref 83.0–108.0)

## 2018-01-04 LAB — CBC
HEMATOCRIT: 33.7 % — AB (ref 36.0–46.0)
HEMOGLOBIN: 10.2 g/dL — AB (ref 12.0–15.0)
MCH: 29.7 pg (ref 26.0–34.0)
MCHC: 30.3 g/dL (ref 30.0–36.0)
MCV: 98 fL (ref 78.0–100.0)
Platelets: 264 10*3/uL (ref 150–400)
RBC: 3.44 MIL/uL — AB (ref 3.87–5.11)
RDW: 13.2 % (ref 11.5–15.5)
WBC: 11.5 10*3/uL — ABNORMAL HIGH (ref 4.0–10.5)

## 2018-01-04 LAB — MAGNESIUM: Magnesium: 1.8 mg/dL (ref 1.7–2.4)

## 2018-01-04 LAB — PROCALCITONIN: Procalcitonin: <0.1 ng/mL

## 2018-01-04 MED ORDER — POTASSIUM CHLORIDE 10 MEQ/50ML IV SOLN
10.0000 meq | INTRAVENOUS | Status: AC
  Administered 2018-01-04 (×6): 10 meq via INTRAVENOUS
  Filled 2018-01-04 (×2): qty 50

## 2018-01-04 MED ORDER — FUROSEMIDE 40 MG PO TABS
40.0000 mg | ORAL_TABLET | Freq: Every day | ORAL | Status: DC
  Administered 2018-01-05 – 2018-01-09 (×5): 40 mg
  Filled 2018-01-04 (×5): qty 1

## 2018-01-04 MED ORDER — INSULIN ASPART 100 UNIT/ML ~~LOC~~ SOLN
5.0000 [IU] | SUBCUTANEOUS | Status: DC
  Administered 2018-01-04 – 2018-01-10 (×34): 5 [IU] via SUBCUTANEOUS

## 2018-01-04 NOTE — Progress Notes (Signed)
OT Cancellation Note  Patient Details Name: [name] [name] MRN: [id number] DOB: 08/26/1945   Cancelled Treatment:    Reason Eval/Treat Not Completed: Medical issues which prohibited therapy. Nsg reports increased lethargy and R sided weakness. Scheduled for CT today. Will assess at later time if appropriate.   Thornell MuleWARD,HILLARY  Trinnity Breunig, OT/L   Acute OT Clinical Specialist Acute Rehabilitation Services Pager [phone] Office [phone]  01/04/2018, 11:44 AM

## 2018-01-04 NOTE — Progress Notes (Signed)
Patient was taken to CT & back to 4N15 without any complications.  

## 2018-01-04 NOTE — Progress Notes (Signed)
Dr. Roda ShuttersXu notified that right pupil is 3mm while the left pupil is 2mm, both reactive but sluggish. No other neuro changes noted. CT head ordered. Status communicated to oncoming RN.

## 2018-01-04 NOTE — Progress Notes (Signed)
NEUROHOSPITALISTS STROKE TEAM - DAILY PROGRESS NOTE   SUBJECTIVE Patient RN is at bedside.  Patient still intubated and off sedation.  Following all commands, node and shake head for questions, appropriate.  Has diarrhea, likely due to tube feeding, put on rectal tube.  Still not able to wean off ventilation.  OBJECTIVE Most recent Vital Signs: Vitals:   01/04/18 1600 01/04/18 1606 01/04/18 1700 01/04/18 1800  BP: (!) 135/54 (!) 135/54 (!) 144/62 (!) 136/58  Pulse: 68 71 70 70  Resp: (!) 26 18 (!) 26 (!) 24  Temp: 98.9 F (37.2 C)     TempSrc: Axillary     SpO2: 98% 98% 98% 98%  Weight:      Height:       CBG (last 3)  Recent Labs    01/04/18 0740 01/04/18 1132 01/04/18 1530  GLUCAP 180* 191* 204*    Physical Exam  HEENT-intubated.   Cardiovascular- irregularly irregular heart rhythm Lungs-vented breath sounds.  Saturations within normal limits Abdomen-obese Musculoskeletal: Pulses weak in right lower extremities, only audible with Doppler and +1 in left lower extremity Skin-warm and dry.  Left groin dressing clean dry and intact no hematoma noted  Neuro:  Intubated off sedation. Eyes closed but able to open briefly on voice.  Answers questions appropriately with nodding or shaking head.  Follows all simple commands.  PERRL, fixed middle position, not lateral gaze. Corneal positive b/l. Very weak gag and cough. Not blinking to visual threat. Facial symmetry not able to test due to ET tube. LUE 3/5 proximal and distal and following commands. LLE 3-/5 proximal and distal 3+/5. RUE flaccid. RLE 2/5 proximal and 3-/5 distally. DTR 1+ and bilateral babinski. Sensation, coordination and gait not tested.  Lipid Panel    Component Value Date/Time   CHOL 135 01/01/2018 0323   TRIG 206 (H) 01/01/2018 0323   HDL 21 (L) 01/01/2018 0323   CHOLHDL 6.4 01/01/2018 0323   VLDL 41 (H) 01/01/2018 0323   LDLCALC 73 01/01/2018 0323   LDLCALC 118 (H) 08/09/2017 0903   HgbA1C  Lab Results  Component Value Date   HGBA1C 8.4 (H) 12/30/2017    Urine Drug Screen:      Component Value Date/Time   LABOPIA NONE DETECTED 01/02/2018 1747   COCAINSCRNUR NONE DETECTED 12/16/2017 1747   LABBENZ NONE DETECTED 12/30/2017 1747   AMPHETMU NONE DETECTED 12/14/2017 1747   THCU NONE DETECTED 12/19/2017 1747   LABBARB NONE DETECTED 12/29/2017 1747    Alcohol Level:  Recent Labs  Lab 12/21/2017 0450  ETH <10    Ct Angio Head/ Neck W Or Wo Contrast/ CT Perfusion  12/29/2017 IMPRESSION:  1. 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 Superior cerebellar arteries not visualized. PCAs are perfused at this time via collateralization from small posterior communicating arteries. 2. No acute core infarct identified by CT perfusion. 3. Approximate 50% stenosis at the origin of the right vertebral artery. Right vertebral artery dominant and otherwise widely patent to the vertebrobasilar junction. 4. Poor opacification of the distal left V2 segment, which may be occluded. Reconstitution at the V3 segment, which remains patent to the cranial vault. Multifocal moderate V4  stenoses as above. 5. Atheromatous stenoses of up to approximately 70-80% about the carotid bifurcations bilaterally. Evaluation limited by prominent calcifications and motion artifact on this exam. 6. 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 7. 60% stenosis at the origin of the left subclavian artery. 8. Patchy multifocal consolidative opacities within the visualized lungs, concerning for pneumonia. Evidence for underlying pulmonary interstitial edema.   Dg Chest 1 View 12/16/2017 IMPRESSION: Tube positions as described without pneumothorax. Pulmonary vascular congestion. Mild perihilar pulmonary edema. No frank consolidation. Left base atelectasis noted. Postoperative changes noted. Electronically Signed    By: Bretta Bang III M.D.   On: 12/21/2017 12:16   Ct Head Wo Contrast 01/09/2018 IMPRESSION: 1. Involving nonhemorrhagic infarct involving the left occipital lobe is described. This is consistent with acute left PCA occlusion. 2. No other acute or focal cortical or basal ganglia infarct evident. 3. Moderate diffuse white matter disease. 4. Anterior ethmoid and frontal sinus disease may be related to intubation.  EKG :A. fib /a flutter  Ct Head Wo Contrast  Result Date: 01/01/2018 CLINICAL DATA:  72 y/o  F; stroke for follow-up. EXAM: CT HEAD WITHOUT CONTRAST TECHNIQUE: Contiguous axial images were obtained from the base of the skull through the vertex without intravenous contrast. COMPARISON:  12/13/2017 CT head, CTA head, CT perfusion head. FINDINGS: Brain: Subacute infarct involving the left occipital lobe, left SCA distribution of cerebellum, and right thalamus with mild edema and local mass effect. No associated hemorrhage. No additional focus of stroke, hemorrhage, or focal mass effect identified. No extra-axial collection, hydrocephalus, or effacement of basilar cisterns. Stable background of chronic microvascular ischemic changes and volume loss of the brain. Vascular: Tip of basilar to right PCA stent in situ. Skull: Normal. Negative for fracture or focal lesion. Sinuses/Orbits: No acute finding. Other: Bilateral intra-ocular lens replacement. IMPRESSION: Subacute infarct involving left occipital lobe, left SCA distribution of cerebellum, and right thalamus. Mild increased edema and local mass effect. No herniation or associated hemorrhage. Electronically Signed   By: Mitzi Hansen M.D.   On: 01/01/2018 02:36   Dg Chest Port 1 View  Result Date: 01/01/2018 CLINICAL DATA:  Respiratory failure EXAM: PORTABLE CHEST 1 VIEW COMPARISON:  12/21/2017 FINDINGS: Endotracheal tube terminates 3.5 cm above the carina. Mild perihilar edema, right lower lobe predominant. Less likely, multifocal  right lung infection is possible. No definite pleural effusions. No pneumothorax. Heart is top-normal in size. Postsurgical changes related to prior CABG. Left subclavian ICD. Enteric tube courses below the diaphragm. IMPRESSION: Endotracheal tube terminates 3.5 cm above the carina. Suspected mild perihilar edema, right lower lobe predominant. Less likely, multifocal right lung infection is possible. Postoperative changes with additional support apparatus as above. Electronically Signed   By: Charline Bills M.D.   On: 01/01/2018 07:07   Ct Head Wo Contrast  Result Date: 01/04/2018 CLINICAL DATA:  Sluggish asymmetric pupils.  History of stroke. EXAM: CT HEAD WITHOUT CONTRAST TECHNIQUE: Contiguous axial images were obtained from the base of the skull through the vertex without intravenous contrast. COMPARISON:  CT HEAD January 01, 2018 FINDINGS: BRAIN: Evolving acute LEFT superior cerebellar infarct and LEFT subacute mesial occipital lobe infarct. Evolving subacute RIGHT small cerebellar infarcts. Evolving RIGHT mesial thalamus infarct with new petechial hemorrhage. Associated regional mass effect without midline shift. No intraparenchymal hemorrhage. Moderate parenchymal brain volume loss. No hydrocephalus. Patchy supratentorial white matter hypodensities compatible with mild chronic small vessel ischemic changes. VASCULAR: Severe calcific atherosclerosis of the carotid siphons. Status post basilar artery  to RIGHT PCA pipeline stent. SKULL: No skull fracture. No significant scalp soft tissue swelling. SINUSES/ORBITS: Trace paranasal sinus mucosal thickening. Mastoid air cells are well aerated.The included ocular globes and orbital contents are non-suspicious. Status post bilateral ocular lens implants. OTHER: Life support lines in place. IMPRESSION: 1. Evolving acute to subacute LEFT greater than RIGHT cerebellar, RIGHT thalamus and LEFT occipital posterior circulation infarcts. No hemorrhagic conversion. No  new infarct. 2. Mild mass effect on fourth ventricle, no obstructive hydrocephalus. Electronically Signed   By: Awilda Metro M.D.   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   CTA head & neck : Positive CTA for evolving LVO with occlusive thrombus within the distal basilar artery. Superior cerebellar arteries not visualized. Approximate 50% stenosis at the origin of the right vertebral artery,  Atheromatous stenoses of up to approximately 70-80% about the carotid bifurcations bilaterally. 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   MRI of the brain : not performed due to pacemaker  Repeat Head CT 9/20: Evolving Left PCA nonhemorrhagic infarct.   Repeat CT head 9/21 - left PCA, left SCA and right thalamic infarcts  Repeat CT 9/24 - bilateral cerebellum, right thalamus, left occipital infarcts, no hydrocephalus.  2D Echocardiogram EF 35 to 40%  LDL 118  HgbA1c 8.4  VTE: heparin subq  Antiplatelets : Aspirin 81 mg daily/Brilinta 90 mg twice daily  Continue Rehab with PT consult, OT consult, Speech consult  Therapy recommendations:   Pending  Disposition:   Pending   Atrial Fibrillation  Home anticoagulation:   Eliquis  Home medications, Betapace   CHA2DS2-VASc Score = 6             Age in Years:  ?39                         Sex:  Female   Female                      Hypertension History:  yes                          Diabetes Mellitus:  0             Congestive Heart Failure History:  0             Vascular Disease History:  yes                             Stroke/TIA/Thromboembolism History:  yes     Meds: Coreg  Due to multifocal infarcts with moderate size on CT, will plan to resume eliquis in 7 days post  stroke and if no procedure planned.    Respiratory failure  Intubated off sedation  CCM on board  Tolerated weaning trial so far  However, brainstem reflexes not enough for extubation  May need trach    Hypertension urgency  Stable off cleviprex  BP goal < 160 post procedure  Home medications: Enalapril, Coreg  Hyperlipidemia  Home meds:   None  LDL 118, goal < 70  on lipitor 80mg  daily  Continue statin at discharge   Uncontrolled diabetes mellitus  A1C 8.4, goal <  7.0  SSI  CBG monitoring  Hyperglycemia after tube feeding  Continue lantus 20U bid, increase to 5units NovoLog every 4 hours for tube feeding coverage  DM coordinator recs appreciated  CHF/CAD s/p CABG  Currently on aspirin and Brilinta post cardiac stent  Continue coreg and home lasix  Avoid fluid overload  Dysphagia  On tube feeding  CBG every 4 hours  NovoLog coverage  Other Stroke Risk Factors  Advanced age  Former smoker  PVD   Other Active Problems  GERD-continue PPI  Hypothyroidism-continue Synthroid   Hospital day # 4  This patient is critically ill due to posterior infarcts, dysphagia, respiratory failure, BA occlusion s/p IR and at significant risk of neurological worsening, death form recurrent strokes, hemorrhagic conversion, cerebral edema, dysphagia, aspiration, seizure. This patient's care requires constant monitoring of vital signs, hemodynamics, respiratory and cardiac monitoring, review of multiple databases, neurological assessment, discussion with family, other specialists and medical decision making of high complexity. I spent 40 minutes of neurocritical care time in the care of this patient. I had long discussion with daughter and son at bedside, updated pt current condition, treatment plan and potential prognosis. They expressed understanding and appreciation.    Marvel PlanJindong Zymiere Trostle, MD PhD Stroke Neurology 01/04/2018 6:39 PM     To contact Stroke  Continuity provider, please refer to [email or web address]. After hours, contact General Neurology

## 2018-01-04 NOTE — Progress Notes (Signed)
PT Cancellation Note  Patient Details Name: [name] [name] MRN: [id number] DOB: 04-20-1945   Cancelled Treatment:    Reason Eval/Treat Not Completed: Patient at procedure or test/unavailable;Patient not medically ready.  Pt showing more lethargy, greater R sided weakness.  Going to CT due to these changes.  Will see at later time as able. 01/04/2018  Freeland BingKen Kannon Baum, PT Acute Rehabilitation Services [phone]  (pager) [phone]  (office)   [name] [name] 01/04/2018, 11:43 AM

## 2018-01-04 NOTE — Progress Notes (Signed)
eLink Physician-Brief Progress Note Patient Name: [name] [name] DOB: 06-02-1945 MRN: [id number]   Date of Service  01/04/2018  HPI/Events of Note  K+ = 3.1 and Creatinine = 0.73.   eICU Interventions  Will replace K+.     Intervention Category Major Interventions: Electrolyte abnormality - evaluation and management  Sommer,Steven Eugene 01/04/2018, 6:20 AM

## 2018-01-04 NOTE — Progress Notes (Signed)
eLink Physician-Brief Progress Note Patient Name: [name] [name] DOB: 03-20-1946 MRN: [id number]   Date of Service  01/04/2018  HPI/Events of Note  ABG on 30%/PC 10/Rate = 15/P 5 = 7.45/36.6/98. Patient is actually overbreathing set ventilator rate.   eICU Interventions  Continue present ventilator management.      Intervention Category Major Interventions: Respiratory failure - evaluation and management  Sommer,Steven Eugene 01/04/2018, 5:00 AM

## 2018-01-04 NOTE — Progress Notes (Signed)
SLP Cancellation Note  Patient Details Name: Gena Frayellie A Minetti MRN: [id number] DOB: 13-Jul-1945   Cancelled treatment:       Reason Eval/Treat Not Completed: Medical issues which prohibited therapy. Patient remains intubated. Signing off. Please re-consult when extubated.   Ferdinand LangoLeah Cabrini Ruggieri MA, CCC-SLP     Kadeshia Kasparian Meryl 01/04/2018, 8:34 AM

## 2018-01-04 NOTE — Progress Notes (Signed)
Inpatient Diabetes Program Recommendations  AACE/ADA: New Consensus Statement on Inpatient Glycemic Control (2015)  Target Ranges:  Prepandial:   less than 140 mg/dL      Peak postprandial:   less than 180 mg/dL (1-2 hours)      Critically ill patients:  140 - 180 mg/dL   Lab Results  Component Value Date   GLUCAP 180 (H) 01/04/2018   HGBA1C 8.4 (H) 12/30/2017    Review of Glycemic Control Results for [name] [name], [name] [name] (MRN [id number]) as of 01/04/2018 10:40  Ref. Range 01/03/2018 15:54 01/03/2018 19:30 01/03/2018 23:09 01/04/2018 03:09 01/04/2018 07:40  Glucose-Capillary Latest Ref Range: 70 - 99 mg/dL 086259 (H) 578212 (H) 469192 (H) 205 (H) 180 (H)  Diabetes history:DM2 Outpatient Diabetes medications:Levemir 80 units QHS, Humalog 24-30 units TID with meals, Victoza 1.8 mg daily Current orders for Inpatient glycemic control:Lantus 20 units bid, Novolog 0-20 units Q4H, Novolog 3 units q 4 hours (tube feed coverage) Inpatient Diabetes Program Recommendations:  Consider increasing Novolog tube feed coverage to 5 units q 4 hours.   Thanks,  Beryl MeagerJenny Cray Monnin, RN, BC-ADM Inpatient Diabetes Coordinator Pager [phone] (8a-5p)

## 2018-01-04 NOTE — Progress Notes (Signed)
NAME:  [name] [name], MRN:  [id number], DOB:  07-15-1945, LOS: 4 ADMISSION DATE:  12/30/2017, CONSULTATION DATE:  9/20 REFERRING MD: Pearlean Brownie , CHIEF COMPLAINT:   MCA Ischemic stroke/ VDRF  Brief History   72 y.o. female with a history of PVD and CHF as well as afib on eliquis who was last seen by family around 7:30 PM.  She arrived to the ED 9/20 am and she was severely dysarthric with right sided weakness, but  able to understand and nod/shake her head.  She was asked  if she was normal when she awoke at 1 AM. She indicates that she was normal at 1 am.  She indicates that she  developed symptoms after that. Exact time of onset after 1 am  is uncertain. CT Head was + for Cerebral infarction due to embolism of right middle cerebral artery. She was out of window for tpa, but within time frame for acute IR intervention. Of interest coags were normal, so Eliquis compliance is questionable. Pt. was intubated for revascularization. PCCM have been consulted for vent management and assistance with ICU care.  Significant Hospital Events   9/20>> Admission 9/20>> CT Brain: Positive CTA for EVLO with occlusive thrombus within the distal basilar artery. 9/21>> 3% saline added at 25cc/hr for Mild increased edema and local mass effect and NA of 136 9/23 >> 3% off, weaned 10 hours on PSV 15/5  Consults: date of consult/date signed off & final recs:  Pulmonary 9/20 IR 9/20 PT/OT/ Speech 9/20 Stroke Team 9/20  Procedures (surgical and bedside):  9/20  SP RT Vertbral artery angiogram followed complete revascularization og occluded mid to distal basilar artery  Significant Diagnostic Tests:  12/20/2017>>  CT head 9/20>> CT Brain: Positive CTA for EVLO with occlusive thrombus within the distal basilar artery. 12/17/2017 Echo>>  Extremely limited; EF 35-40%, definity used; apical akinesis; hypokinesis of   the basal inferior wall; overall moderate LV dysfunction;   moderate LVE; mild LVH; trace AI; mild  MR; moderate LAE.  01/02/2018 TTE >> - Left ventricle: The cavity size was moderately dilated. Wall   thickness was increased in a pattern of mild LVH. Systolic   function was moderately reduced. The estimated ejection fraction   was in the range of 35% to 40%. There is akinesis of the apical   myocardium. There is hypokinesis of the basalinferior myocardium. - Aortic valve: There was trivial regurgitation. - Mitral valve: Calcified annulus. Mildly thickened leaflets.  There was mild regurgitation. - Left atrium: The atrium was moderately dilated. Impressions: - Extremely limited; definity used; apical akinesis; hypokinesis of  the basal inferior wall; overall moderate LV dysfunction;   moderate LVE; mild LVH; trace AI; mild MR; moderate LAE.  CT head 9/21 >> Subacute infarct involving left occipital lobe, left SCA distribution of cerebellum, and right thalamus. Mild increased edema and local mass effect. No herniation or associated hemorrhage.  Micro Data: 12/07/2017>> Recent toe infection>>STAPHYLOCOCCUS AUREUS   9/20 MRSA PCR >> neg  Antimicrobials:  9/20 cefazolin (pre-op)  Subjective:  Per RN, more lethargic than yesterday and having frequent ectopy Weaned x 10 hr on PSV yesterday Tmax overnight 101 Diuresed -3.5L    Objective   Blood pressure (!) 157/65, pulse 71, temperature 99.6 F (37.6 C), temperature source Axillary, resp. rate (!) 23, height 5\' 1"  (1.549 m), weight 108.5 kg, SpO2 97 %.    Vent Mode: PCV FiO2 (%):  [30 %] 30 % Set Rate:  [15 bmp-30 bmp] 15 bmp  PEEP:  [5 cmH20] 5 cmH20 Pressure Support:  [15 cmH20] 15 cmH20 Plateau Pressure:  [15 cmH20-17 cmH20] 15 cmH20   Intake/Output Summary (Last 24 hours) at 01/04/2018 0736 Last data filed at 01/04/2018 0600 Gross per 24 hour  Intake 935.07 ml  Output 3500 ml  Net -2564.93 ml   Filed Weights   01/02/18 1100 01/03/18 0500 01/04/18 0352  Weight: 113 kg 112.3 kg 108.5 kg    Examination: General:  Older  adult on MV in NAD HEENT: MM pink/moist, 7.5 ETT, 24 at lip, OGT, pupils 3/reactive, absent R corneal reflex, present on Left Neuro: eyes do not open but will f/c and moves weakly all extremities except RUE, no gag CV: paced with ecopty, left upper chest pacer, +2 pulses PULM: even/non-labored on MV/ PCV 10/5 with TV ~47600ml, lungs bilaterally clear, minimal secretions GI: soft, obese, BS active  Extremities: warm/dry, no BLE edema, R great toe wound- healing Skin: no rashes   Resolved Hospital Problem list    Assessment & Plan:   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  Suspect some brain stem infarct per neuro 9/21>> edema on follow up scan w/ left PCA, left SCA and right thalamic infarcts; Na 139>> Started 3% saline at 25 cc/hr for mild cerebral edema - off cleviprex 9/20 - off sedation 9/20 Plan: Neurology primary SBP goal < 160- at goal  Ongoing neuro exams, monitor for seizures Further imaging per Neuro Continue asa, brilinta and lipitor   SLP/ PT/ OT when able   Acute Respiratory Insufficiency in setting of Cerebral infarction  - Difficult intubation 9/20 - Weaning since 9/22 - has not required sedation since 9/20 - ABG reviewed on current MV settings, continues to breath over vent, currently at 22 Plan: Full MV support, PCV  CXR in am  Daily SBT however mental status and absent gag are a barrier to extubation and not at weaning goal, started on 10/5 PSV and required 15/5 remainder of day; I am concerned for ongoing airway protection given the location and severity of her stroke.  Will need to start discussion with family concerning longterm -GOC if no improvement.  Day 5/x of ETT Albuterol nebs prn  Hx. Atrial Fibrillation on Eliquis    Hx. CHF, Pulmonary HTN, Bi Ventricular pacemeaker, PVD - TTE 2017/12/29 w/ EF 35-40%;  Limited study; apical akinesis; hypokinesis of  the basal inferior wall; overall moderate LV dysfunction;   moderate LVE; mild LVH; trace AI; mild MR; moderate LAE. Plan: Tele monitoring Goal SBP < 160 Defer to Neuro for timing of restarting eliquis Continue home vasotec, coreg Good diuresis with home dose yesterday, 3.5L Decrease lasix 40mg  to daily per tube with KCL 40 meq daily  Continue foley given her ongoing decreased mental status Prn labetalol  Strict I/O's/ daily weights/ trending renal panel/ mag daily  Monitor for volume overload/ daily wts/ I/Os  Hypocalcemia Plan: trend  Hypokalemia - 2/2 diuresis P:  S/p KCL replete Check mag Daily KCL 40meq   On Eliquis at home - normal coags on admit, some question of compliance Leukocytosis- resolved Plan SCDs/ heparin SQ for VTE Defer eliquis to neurology as above  Trending CBC Monitor for bleeding, no signs of bleeding, Hgb stable Transfuse for HGB < 7  Leukocytosis-  -  12/07/2017>> Recent toe infection + for STAPHYLOCOCCUS AUREUS - PCT  WNL  9/21 - new Tmax 101 overnight, slight increase in WBC, no change in secretions and left great toe healing well Plan: Trend fever curve and WBC Add on PCT, if elevated will pan culture and assess abx  DM - better but ongoing hyperglycemia P:  CBG q4 SSI resistant Continue TF coverage 3units q 4 Lantus increased from 10u BID to 20 u BID to reach glucose control   Hypothyroid P:  Continue synthroid po Recheck TSH in 5 weeks  Elevated Triglycerides - Most likely 2/2 Cleviprex/ propofol>> Both now off Plan: lipitor as above   Disposition / Summary of Today's Plan 01/04/18   Continue SBT Ongoing neuro assessments BP control < 160 Stopping hypertonic saline/ increasing diuresis     Diet: TF at goal per dietitian  Pain/Anxiety/Delirium protocol : as needed VAP protocol   DVT prophylaxis: SQ heparin, SCDs GI prophylaxis: Protonix  Hyperglycemia protocol: CBG/ SSI/ lantus Mobility:BR Code Status: FC Family Communication:  Per RN, family visiting in afternoons.  No family currently at bedside.   Labs   CBC: Recent Labs  Lab 12/13/2017 0450 12/25/2017 0453 01/01/18 0323 01/02/18 0528 01/03/18 0500 01/04/18 0349  WBC 11.6*  --  10.8* 10.0 10.1 11.5*  NEUTROABS 7.8*  --  8.0*  --   --   --   HGB 14.2 14.6 10.4* 10.6* 10.3* 10.2*  HCT 44.0 43.0 34.0* 35.7* 33.8* 33.7*  MCV 95.0  --  96.3 100.3* 98.3 98.0  PLT 378  --  310 265 272 264   Basic Metabolic Panel: Recent Labs  Lab 12/22/2017 0450 01/09/2018 0453  01/01/18 0323  01/02/18 0816 01/02/18 1430 01/02/18 1700 01/02/18 2006 01/03/18 0209 01/03/18 0500 01/03/18 1629 01/04/18 0349  NA 137 138  --  139   < > 141 142  --  150* 144 144  --  144  K 4.7 4.7  --  3.6  --  4.3  --   --   --   --  3.7  --  3.1*  CL 103 105  --  111  --  114*  --   --   --   --  112*  --  109  CO2 26  --   --  18*  --  18*  --   --   --   --  24  --  26  GLUCOSE 286* 281*  --  210*  --  252*  --   --   --   --  267*  --  197*  BUN 20 22  --  13  --  12  --   --   --   --  19  --  22  CREATININE 0.87 0.80  --  0.78  --  0.77  --   --   --   --  0.73  --  0.73  CALCIUM 9.5  --   --  7.8*  --  8.0*  --   --   --   --  8.4*  --  8.7*  MG  --   --    < > 1.7  --  2.2 1.9 2.0  --   --  1.9 1.9  --   PHOS  --   --   --  2.7  --   --  1.8* 1.9*  --   --  2.5 2.6  --    < > =  values in this interval not displayed.   GFR: Estimated Creatinine Clearance: 74.5 mL/min (by C-G formula based on SCr of 0.73 mg/dL). Recent Labs  Lab 01/01/18 0323 01/02/18 0528 01/02/18 0816 01/03/18 0500 01/04/18 0349  PROCALCITON <0.10  --  <0.10  --   --   WBC 10.8* 10.0  --  10.1 11.5*  LATICACIDVEN  --  1.3  --   --   --    Liver Function Tests: Recent Labs  Lab 12/30/17 1004 01/02/18 0450  AST 18 20  ALT 14 17  ALKPHOS  --  50   BILITOT 0.8 0.8  PROT 7.7 8.7*  ALBUMIN  --  4.3   No results for input(s): LIPASE, AMYLASE in the last 168 hours. No results for input(s): AMMONIA in the last 168 hours. ABG    Component Value Date/Time   PHART 7.451 (H) 01/04/2018 0426   PCO2ART 36.6 01/04/2018 0426   PO2ART 98.0 01/04/2018 0426   HCO3 25.3 01/04/2018 0426   TCO2 26 01/04/2018 0426   ACIDBASEDEF 5.0 (H) 01/01/2018 0333   O2SAT 98.0 01/04/2018 0426    Coagulation Profile: Recent Labs  Lab 2018/01/02 0450 01/01/18 0323  INR 1.12 1.25   Cardiac Enzymes: Recent Labs  Lab 01/01/18 0323 01/03/18 0500  TROPONINI 0.03* 0.03*   HbA1C: Hgb A1c MFr Bld  Date/Time Value Ref Range Status  12/30/2017 10:04 AM 8.4 (H) <5.7 % of total Hgb Final    Comment:    For someone without known diabetes, a hemoglobin A1c value of 6.5% or greater indicates that they may have  diabetes and this should be confirmed with a follow-up  test. . For someone with known diabetes, a value <7% indicates  that their diabetes is well controlled and a value  greater than or equal to 7% indicates suboptimal  control. A1c targets should be individualized based on  duration of diabetes, age, comorbid conditions, and  other considerations. . Currently, no consensus exists regarding use of hemoglobin A1c for diagnosis of diabetes for children. Marland Kitchen   08/09/2017 09:03 AM 9.2 (H) <5.7 % of total Hgb Final    Comment:    For someone without known diabetes, a hemoglobin A1c value of 6.5% or greater indicates that they may have  diabetes and this should be confirmed with a follow-up  test. . For someone with known diabetes, a value <7% indicates  that their diabetes is well controlled and a value  greater than or equal to 7% indicates suboptimal  control. A1c targets should be individualized based on  duration of diabetes, age, comorbid conditions, and  other considerations. . Currently, no consensus exists regarding use of hemoglobin  A1c for diagnosis of diabetes for children. .    CBG: Recent Labs  Lab 01/03/18 1154 01/03/18 1554 01/03/18 1930 01/03/18 2309 01/04/18 0309  GLUCAP 333* 259* 212* 192* 205*    CCT 35 mins  Posey Boyer, AGACNP-BC [facility] Pgr: [phone] or if no answer [phone] 01/04/2018, 7:36 AM

## 2018-01-05 ENCOUNTER — Ambulatory Visit: Payer: Medicare Other | Admitting: "Endocrinology

## 2018-01-05 ENCOUNTER — Inpatient Hospital Stay (HOSPITAL_COMMUNITY): Payer: Medicare Other

## 2018-01-05 LAB — URINALYSIS, COMPLETE (UACMP) WITH MICROSCOPIC
Bacteria, UA: NONE SEEN
Bilirubin Urine: NEGATIVE
GLUCOSE, UA: NEGATIVE mg/dL
HGB URINE DIPSTICK: NEGATIVE
Ketones, ur: NEGATIVE mg/dL
Leukocytes, UA: NEGATIVE
Nitrite: NEGATIVE
Protein, ur: NEGATIVE mg/dL
SPECIFIC GRAVITY, URINE: 1.012 (ref 1.005–1.030)
pH: 5 (ref 5.0–8.0)

## 2018-01-05 LAB — GLUCOSE, CAPILLARY
GLUCOSE-CAPILLARY: 141 mg/dL — AB (ref 70–99)
GLUCOSE-CAPILLARY: 174 mg/dL — AB (ref 70–99)
GLUCOSE-CAPILLARY: 177 mg/dL — AB (ref 70–99)
GLUCOSE-CAPILLARY: 213 mg/dL — AB (ref 70–99)
Glucose-Capillary: 185 mg/dL — ABNORMAL HIGH (ref 70–99)
Glucose-Capillary: 160 mg/dL — ABNORMAL HIGH (ref 70–99)
Glucose-Capillary: 207 mg/dL — ABNORMAL HIGH (ref 70–99)

## 2018-01-05 LAB — CBC
HCT: 33.8 % — ABNORMAL LOW (ref 36.0–46.0)
Hemoglobin: 10.2 g/dL — ABNORMAL LOW (ref 12.0–15.0)
MCH: 29.7 pg (ref 26.0–34.0)
MCHC: 30.2 g/dL (ref 30.0–36.0)
MCV: 98.3 fL (ref 78.0–100.0)
Platelets: 286 10*3/uL (ref 150–400)
RBC: 3.44 MIL/uL — AB (ref 3.87–5.11)
RDW: 13.5 % (ref 11.5–15.5)
WBC: 11.9 10*3/uL — ABNORMAL HIGH (ref 4.0–10.5)

## 2018-01-05 LAB — RENAL FUNCTION PANEL
ANION GAP: 10 (ref 5–15)
Albumin: 2.7 g/dL — ABNORMAL LOW (ref 3.5–5.0)
BUN: 25 mg/dL — ABNORMAL HIGH (ref 8–23)
CALCIUM: 8.9 mg/dL (ref 8.9–10.3)
CO2: 27 mmol/L (ref 22–32)
Chloride: 108 mmol/L (ref 98–111)
Creatinine, Ser: 0.72 mg/dL (ref 0.44–1.00)
GFR calc non Af Amer: >60 mL/min (ref >60)
GLUCOSE: 182 mg/dL — AB (ref 70–99)
Phosphorus: 3.8 mg/dL (ref 2.5–4.6)
Potassium: 3.4 mmol/L — ABNORMAL LOW (ref 3.5–5.1)
SODIUM: 145 mmol/L (ref 135–145)

## 2018-01-05 LAB — MAGNESIUM: Magnesium: 1.8 mg/dL (ref 1.7–2.4)

## 2018-01-05 MED ORDER — MAGNESIUM SULFATE 2 GM/50ML IV SOLN
2.0000 g | Freq: Once | INTRAVENOUS | Status: AC
  Administered 2018-01-05: 2 g via INTRAVENOUS
  Filled 2018-01-05: qty 50

## 2018-01-05 MED ORDER — POTASSIUM CHLORIDE 10 MEQ/50ML IV SOLN
10.0000 meq | INTRAVENOUS | Status: AC
  Administered 2018-01-05 (×3): 10 meq via INTRAVENOUS
  Filled 2018-01-05 (×3): qty 50

## 2018-01-05 NOTE — Progress Notes (Signed)
OT Cancellation Note  Patient Details Name: [name] [name] MRN: [id number] DOB: [birth year]   Cancelled Treatment:    Reason Eval/Treat Not Completed: Patient not medically ready. Will follow up tomorrow and sign off if not appropriate.   Thornell Mule, OT/L   Acute OT Clinical Specialist Acute Rehabilitation Services Pager [phone] Office [phone]  01/05/2018, 4:33 PM

## 2018-01-05 NOTE — Progress Notes (Signed)
PT Cancellation Note  Patient Details Name: [name] [name] MRN: [id number] DOB: 1945-10-15   Cancelled Treatment:    Reason Eval/Treat Not Completed: Patient not medically ready.  Even more lethargic today.  Not able to participate well.  Will see tomorrow as able. 01/05/2018  Cardiff Bing, PT Acute Rehabilitation Services [phone]  (pager) [phone]  (office)   Eliseo Gum [place] 01/05/2018, 4:33 PM

## 2018-01-05 NOTE — Progress Notes (Signed)
NEUROHOSPITALISTS STROKE TEAM - DAILY PROGRESS NOTE   SUBJECTIVE Patient RN and daughter in law are at bedside.  Patient still intubated and off sedation. More lethargic today and barely following commands. Not able to extubate today. Had low grade fever 100.5 this am.   OBJECTIVE Most recent Vital Signs: Vitals:   01/05/18 1000 01/05/18 1059 01/05/18 1100 01/05/18 1200  BP: (!) 136/57  (!) 131/56 (!) 127/54  Pulse: 70  70 70  Resp: (!) 27  (!) 27 (!) 24  Temp:    99.2 F (37.3 C)  TempSrc:    Axillary  SpO2: 98% 98% 98% 97%  Weight:      Height:       CBG (last 3)  Recent Labs    01/05/18 0326 01/05/18 0729 01/05/18 1146  GLUCAP 185* 160* 213*    Physical Exam  HEENT-intubated.   Cardiovascular- irregularly irregular heart rhythm Lungs-vented breath sounds.  Saturations within normal limits Abdomen-obese Musculoskeletal: Pulses weak in right lower extremities, only audible with Doppler and +1 in left lower extremity Skin-warm and dry.  Left groin dressing clean dry and intact no hematoma noted  Neuro:  Intubated off sedation. Eyes closed but barely open on voice or pain.  Lethargic, barely follows simple commands.  PERRL, fixed middle position, not lateral gaze. Corneal weakly positive b/l. Weak gag and cough. Not blinking to visual threat. Facial symmetry not able to test due to ET tube. LUE 3-/5 proximal and distal and following commands. BLE mild withdraw to pain but able to wiggle toes bilaterally. RUE flaccid. DTR 1+ and bilateral babinski. Sensation, coordination and gait not tested.  Lipid Panel    Component Value Date/Time   CHOL 135 01/01/2018 0323   TRIG 206 (H) 01/01/2018 0323   HDL 21 (L) 01/01/2018 0323   CHOLHDL 6.4 01/01/2018 0323   VLDL 41 (H) 01/01/2018 0323   LDLCALC 73 01/01/2018 0323   LDLCALC 118 (H) 08/09/2017 0903   HgbA1C  Lab Results  Component Value Date   HGBA1C 8.4 (H) 12/30/2017      Urine Drug Screen:      Component Value Date/Time   LABOPIA NONE DETECTED 2018-01-26 1747   COCAINSCRNUR NONE DETECTED 01-26-2018 1747   LABBENZ NONE DETECTED Jan 26, 2018 1747   AMPHETMU NONE DETECTED 01/26/18 1747   THCU NONE DETECTED 01/26/18 1747   LABBARB NONE DETECTED Jan 26, 2018 1747    Alcohol Level:  Recent Labs  Lab 26-Jan-2018 0450  ETH <10    Ct Angio Head/ Neck W Or Wo Contrast/ CT Perfusion  01-26-18 IMPRESSION:  1. 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 Superior cerebellar arteries not visualized. PCAs are perfused at this time via collateralization from small posterior communicating arteries. 2. No acute core infarct identified by CT perfusion. 3. Approximate 50% stenosis at the origin of the right vertebral artery. Right vertebral artery dominant and otherwise widely patent to the vertebrobasilar junction. 4. Poor opacification of the distal left V2 segment, which may be occluded. Reconstitution at the V3 segment, which remains patent to the cranial vault. Multifocal moderate V4 stenoses as above. 5. Atheromatous stenoses of up to approximately 70-80% about the carotid bifurcations  bilaterally. Evaluation limited by prominent calcifications and motion artifact on this exam. 6. 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 7. 60% stenosis at the origin of the left subclavian artery. 8. Patchy multifocal consolidative opacities within the visualized lungs, concerning for pneumonia. Evidence for underlying pulmonary interstitial edema.   Dg Chest 1 View 01/03/2018 IMPRESSION: Tube positions as described without pneumothorax. Pulmonary vascular congestion. Mild perihilar pulmonary edema. No frank consolidation. Left base atelectasis noted. Postoperative changes noted. Electronically Signed   By: Bretta Bang III M.D.   On: 12/16/2017 12:16   Ct Head Wo Contrast 12/27/2017 IMPRESSION: 1.  Involving nonhemorrhagic infarct involving the left occipital lobe is described. This is consistent with acute left PCA occlusion. 2. No other acute or focal cortical or basal ganglia infarct evident. 3. Moderate diffuse white matter disease. 4. Anterior ethmoid and frontal sinus disease may be related to intubation.  EKG :A. fib /a flutter  Ct Head Wo Contrast  Result Date: 01/01/2018 CLINICAL DATA:  72 y/o  F; stroke for follow-up. EXAM: CT HEAD WITHOUT CONTRAST TECHNIQUE: Contiguous axial images were obtained from the base of the skull through the vertex without intravenous contrast. COMPARISON:  01/09/2018 CT head, CTA head, CT perfusion head. FINDINGS: Brain: Subacute infarct involving the left occipital lobe, left SCA distribution of cerebellum, and right thalamus with mild edema and local mass effect. No associated hemorrhage. No additional focus of stroke, hemorrhage, or focal mass effect identified. No extra-axial collection, hydrocephalus, or effacement of basilar cisterns. Stable background of chronic microvascular ischemic changes and volume loss of the brain. Vascular: Tip of basilar to right PCA stent in situ. Skull: Normal. Negative for fracture or focal lesion. Sinuses/Orbits: No acute finding. Other: Bilateral intra-ocular lens replacement. IMPRESSION: Subacute infarct involving left occipital lobe, left SCA distribution of cerebellum, and right thalamus. Mild increased edema and local mass effect. No herniation or associated hemorrhage. Electronically Signed   By: Mitzi Hansen M.D.   On: 01/01/2018 02:36   Dg Chest Port 1 View  Result Date: 01/01/2018 CLINICAL DATA:  Respiratory failure EXAM: PORTABLE CHEST 1 VIEW COMPARISON:  01/08/2018 FINDINGS: Endotracheal tube terminates 3.5 cm above the carina. Mild perihilar edema, right lower lobe predominant. Less likely, multifocal right lung infection is possible. No definite pleural effusions. No pneumothorax. Heart is top-normal  in size. Postsurgical changes related to prior CABG. Left subclavian ICD. Enteric tube courses below the diaphragm. IMPRESSION: Endotracheal tube terminates 3.5 cm above the carina. Suspected mild perihilar edema, right lower lobe predominant. Less likely, multifocal right lung infection is possible. Postoperative changes with additional support apparatus as above. Electronically Signed   By: Charline Bills M.D.   On: 01/01/2018 07:07   Ct Head Wo Contrast  Result Date: 01/04/2018 CLINICAL DATA:  Sluggish asymmetric pupils.  History of stroke. EXAM: CT HEAD WITHOUT CONTRAST TECHNIQUE: Contiguous axial images were obtained from the base of the skull through the vertex without intravenous contrast. COMPARISON:  CT HEAD January 01, 2018 FINDINGS: BRAIN: Evolving acute LEFT superior cerebellar infarct and LEFT subacute mesial occipital lobe infarct. Evolving subacute RIGHT small cerebellar infarcts. Evolving RIGHT mesial thalamus infarct with new petechial hemorrhage. Associated regional mass effect without midline shift. No intraparenchymal hemorrhage. Moderate parenchymal brain volume loss. No hydrocephalus. Patchy supratentorial white matter hypodensities compatible with mild chronic small vessel ischemic changes. VASCULAR: Severe calcific atherosclerosis of the carotid siphons. Status post basilar artery to RIGHT PCA pipeline stent. SKULL: No skull fracture. No significant scalp soft tissue swelling.  SINUSES/ORBITS: Trace paranasal sinus mucosal thickening. Mastoid air cells are well aerated.The included ocular globes and orbital contents are non-suspicious. Status post bilateral ocular lens implants. OTHER: Life support lines in place. IMPRESSION: 1. Evolving acute to subacute LEFT greater than RIGHT cerebellar, RIGHT thalamus and LEFT occipital posterior circulation infarcts. No hemorrhagic conversion. No new infarct. 2. Mild mass effect on fourth ventricle, no obstructive hydrocephalus. Electronically  Signed   By: Awilda Metro M.D.   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   CTA head & neck : Positive CTA for evolving LVO with occlusive thrombus within the distal basilar artery. Superior cerebellar arteries not visualized. Approximate 50% stenosis at the origin of the right vertebral artery,  Atheromatous stenoses of up to approximately 70-80% about the carotid bifurcations bilaterally. 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   MRI of the brain : not performed due to pacemaker  Repeat Head CT 9/20: Evolving Left PCA nonhemorrhagic infarct.   Repeat CT head 9/21 - left PCA, left SCA and right thalamic infarcts  Repeat CT 9/24 - bilateral cerebellum, right thalamus, left occipital infarcts, no hydrocephalus.  2D Echocardiogram EF 35 to 40%  LDL 118  HgbA1c 8.4  VTE: heparin subq  Antiplatelets : Aspirin 81 mg daily/Brilinta 90 mg twice daily due to stenting  Continue Rehab with PT consult, OT consult, Speech consult  Therapy recommendations:   Pending  Disposition:   Pending  Discussed with daughter in law and she believes that pt would not want to prolong life without quality, she is going to discuss with pt son.    Atrial Fibrillation  Home anticoagulation:   Eliquis  Home medications, Betapace   CHA2DS2-VASc Score = 6             Age in Years:  ?13                         Sex:  Female   Female                      Hypertension History:  yes                          Diabetes Mellitus:  0             Congestive Heart Failure History:  0             Vascular Disease History:  yes                             Stroke/TIA/Thromboembolism History:  yes     Meds: Coreg  Due to multifocal infarcts with  moderate size on CT, will plan to resume eliquis in 7 days post stroke and if no procedure planned.    Respiratory failure  Intubated off sedation  CCM on board  Tolerated weaning trial so far  However, mental status and brainstem reflexes not enough for extubation  May need trach vs. Comfort care  Hypertension urgency  Stable off cleviprex  BP goal < 160 post procedure  Home medications: Enalapril, Coreg  Hyperlipidemia  Home meds:   None  LDL 118, goal < 70  on lipitor 80mg  daily  Continue statin at discharge   Uncontrolled diabetes mellitus  A1C 8.4, goal < 7.0  SSI  CBG monitoring  Hyperglycemia improving  Continue lantus 20U bid, and 5units NovoLog every 4 hours for tube feeding coverage  DM coordinator recs appreciated  CHF/CAD s/p CABG  Currently on aspirin and Brilinta post cardiac stent  Continue coreg and home lasix  Avoid fluid overload  Low grade fever  Could be reason for her lethargy  Tmax 100.5  Will repeat UA  CXR no frank penumonia  Will do blood culture given her left toe infection last month  Dysphagia  On tube feeding  CBG every 4 hours  NovoLog coverage  Other Stroke Risk Factors  Advanced age  Former smoker  PVD   Other Active Problems  GERD-continue PPI  Hypothyroidism-continue Synthroid   Hospital day # 5  This patient is critically ill due to posterior infarcts, dysphagia, respiratory failure, BA occlusion s/p IR and at significant risk of neurological worsening, death form recurrent strokes, hemorrhagic conversion, cerebral edema, dysphagia, aspiration, seizure. This patient's care requires constant monitoring of vital signs, hemodynamics, respiratory and cardiac monitoring, review of multiple databases, neurological assessment, discussion with family, other specialists and medical decision making of high complexity. I spent 35 minutes of neurocritical care time in the care of this patient. I had long  discussion with daughter and son at bedside, updated pt current condition, treatment plan and potential prognosis. They expressed understanding and appreciation.    Marvel PlanJindong Francois Elk, MD PhD Stroke Neurology 01/05/2018 1:26 PM     To contact Stroke Continuity provider, please refer to [email or web address]. After hours, contact General Neurology

## 2018-01-05 NOTE — Progress Notes (Addendum)
NAME:  [name] [name], MRN:  [id number], DOB:  01/19/1946, LOS: 5 ADMISSION DATE:  01/09/2018, CONSULTATION DATE:  9/20 REFERRING MD: Pearlean Brownie , CHIEF COMPLAINT:   MCA Ischemic stroke/ VDRF  Brief History   72 y.o. female with a history of PVD and CHF as well as afib on eliquis who was last seen by family around 7:30 PM.  She arrived to the ED 9/20 am and she was severely dysarthric with right sided weakness, but  able to understand and nod/shake her head.  She was found to have CVA or R MCA felt to be secondary to Afib. Out of window for tPa but had successful IR revascularization. Course has been complicated by evolving infarct and cerebral edema requiring 3%. Decreased mental status limiting extubation.   Significant Hospital Events   9/20>> Admission 9/20>> CT Brain: Positive CTA for EVLO with occlusive thrombus within the distal basilar artery. 9/21>> 3% saline added at 25cc/hr for Mild increased edema and local mass effect and NA of 136 9/23 >> 3% off, weaned 10 hours on PSV 15/5  Consults: date of consult/date signed off & final recs:  Pulmonary 9/20 IR 9/20 PT/OT/ Speech 9/20 Stroke Team 9/20  Procedures (surgical and bedside):  9/20  SP RT Vertbral artery angiogram followed complete revascularization og occluded mid to distal basilar artery 9/20 ETT  Significant Diagnostic Tests:  9/20>> CT Brain: Positive CTA for EVLO with occlusive thrombus within the distal basilar artery. 12/16/2017 Echo:  Extremely limited; EF 35-40%, definity used; apical akinesis; hypokinesis of the basal inferior wall; overall moderate LV dysfunction; moderate LVE; mild LVH; trace AI; mild MR; moderate LAE. 01/08/2018 TTE : Extremely limited; definity used; apical akinesis; hypokinesis of  the basal inferior wall; overall moderate LV dysfunction;  moderate LVE; mild LVH; trace AI; mild MR; moderate LAE. CT head 9/21 >> Subacute infarct involving left occipital lobe, left SCA distribution of cerebellum, and  right thalamus. Mild increased edema and local mass effect. No herniation or associated hemorrhage. 9/24 CT head > Evolving acute to subacute LEFT greater than RIGHT cerebellar, RIGHT thalamus and LEFT occipital posterior circulation infarcts. No hemorrhagic conversion. No new infarct. Mild mass effect on fourth ventricle, no obstructive hydrocephalus.  Micro Data: 12/07/2017>> Recent toe infection>>STAPHYLOCOCCUS AUREUS   9/20 MRSA PCR >> neg  Antimicrobials:  9/20 cefazolin (pre-op)  Subjective:  RN reports: More lethargic than yesterday, still following some commands, but less. Had CT yesterday showing evolving infarcts.    Objective   Blood pressure (!) 144/69, pulse 70, temperature 99 F (37.2 C), temperature source Axillary, resp. rate (!) 24, height 5\' 1"  (1.549 m), weight 109.2 kg, SpO2 99 %.    Vent Mode: CPAP;PSV FiO2 (%):  [30 %] 30 % Set Rate:  [15 bmp] 15 bmp PEEP:  [5 cmH20] 5 cmH20 Pressure Support:  [12 cmH20] 12 cmH20 Plateau Pressure:  [15 cmH20-16 cmH20] 16 cmH20   Intake/Output Summary (Last 24 hours) at 01/05/2018 0811 Last data filed at 01/05/2018 0800 Gross per 24 hour  Intake 1524.25 ml  Output 2250 ml  Net -725.75 ml   Filed Weights   01/03/18 0500 01/04/18 0352 01/05/18 0404  Weight: 112.3 kg 108.5 kg 109.2 kg    Examination: General:  Elderly female in NAD on vent HEENT: [place]/AT. R pupil 3mm irregular, fixed. Left pupil 2mm and minimally reactive to light. MMM Neuro: Will weakly follow commands in all 4 extremities. RUE weakest. No eye opening this AM.  CV: Paced. No MRG PULM:  Clear. No distress on pressure support.  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 S/p IR revascularization on 9/20.  Complicated by cerebral edema now s/p hypertonic saline. Follow up CT shows evolving infarcts. Off sedation since 9/20. Plan: Neurology primary SBP goal < Ongoing neuro exams, monitor for seizures Continue asa, brilinta and lipitor   SLP/ PT/ OT when able   Acute Respiratory Insufficiency in setting of Cerebral infarction  Plan: Daily SBT, tolerating well today. Mental status limits extubation. Likely a good candidate for early trach if family feels this is the appropriate course. Need to address prognosis with neurology.  Day 6/x of ETT Albuterol nebs prn  Atrial Fibrillation on Eliquis  Hx. CHF, Pulmonary HTN, Bi Ventricular pacemeaker, PVD - TTE 01/02/2018 w/ EF 35-40%;  Limited study; apical akinesis; hypokinesis of  the basal inferior wall; overall moderate LV dysfunction;   moderate LVE; mild LVH; trace AI; mild MR; moderate LAE. Plan: Continue home vasotec, coreg Resume 1/2 home dose lasix PO with KCL 40 meq daily  DC foley, start Purewick Prn labetalol  Strict I/O's/ daily weights/ trending renal panel/ mag daily   Hypokalemia P:  Daily KCL   On Eliquis at home - normal coags on admit, some question of compliance Plan SCDs/ heparin SQ for VTE Resume Eliquis 9/27 Trending CBC Monitor for bleeding, no signs of bleeding, Hgb stable Transfuse for HGB < 7  Leukocytosis-  - 12/07/2017>> Recent toe infection + for STAPHYLOCOCCUS AUREUS - PCT  WNL  9/21 - new Tmax 101 overnight, slight increase in WBC, no change in secretions and left great toe healing well - Procalcitonin negative  Plan: Trend fever curve and WBC  DM - better but ongoing hyperglycemia P:  CBG q4 SSI resistant Increase TF coverage 5 units q 4 per Diabetes RN recs Lantus 20 BID  Hypothyroid P:  Continue synthroid po Recheck TSH in late october  Elevated Triglycerides - Most likely 2/2 Cleviprex/ propofol>> Both now off Plan: lipitor as above   Disposition / Summary of  Today's Plan 01/05/18   Tolerating SBT, just not waking up enough to be extubated. RN describes exam as marginally worse today. From our standpoint, we need to determine if she can have meaningful recovery. If so, we should consider early tracheostomy.     Diet: TF at goal per dietitian  Pain/Anxiety/Delirium protocol : as needed VAP protocol  DVT prophylaxis: SQ heparin, SCDs GI prophylaxis: Protonix  Hyperglycemia protocol: CBG/ SSI/ lantus Mobility:BR Code Status: FC Family Communication: Will track them down when they get here later today.   Labs   CBC: Recent Labs  Lab 12/28/2017 0450  01/01/18 0323 01/02/18 0528 01/03/18 0500 01/04/18 0349 01/05/18 0404  WBC 11.6*  --  10.8* 10.0 10.1 11.5* 11.9*  NEUTROABS 7.8*  --  8.0*  --   --   --   --   HGB 14.2   < > 10.4* 10.6* 10.3* 10.2* 10.2*  HCT 44.0   < > 34.0* 35.7* 33.8* 33.7* 33.8*  MCV 95.0  --  96.3 100.3* 98.3 98.0 98.3  PLT 378  --  310 265 272 264 286   < > = values in this interval not displayed.  Basic Metabolic Panel: Recent Labs  Lab 01/01/18 0323  01/02/18 0816 01/02/18 1430 01/02/18 1700 01/02/18 2006 01/03/18 0209 01/03/18 0500 01/03/18 1629 01/04/18 0349 01/05/18 0404  NA 139   < > 141 142  --  150* 144 144  --  144 145  K 3.6  --  4.3  --   --   --   --  3.7  --  3.1* 3.4*  CL 111  --  114*  --   --   --   --  112*  --  109 108  CO2 18*  --  18*  --   --   --   --  24  --  26 27  GLUCOSE 210*  --  252*  --   --   --   --  267*  --  197* 182*  BUN 13  --  12  --   --   --   --  19  --  22 25*  CREATININE 0.78  --  0.77  --   --   --   --  0.73  --  0.73 0.72  CALCIUM 7.8*  --  8.0*  --   --   --   --  8.4*  --  8.7* 8.9  MG 1.7  --  2.2 1.9 2.0  --   --  1.9 1.9 1.8 1.8  PHOS 2.7  --   --  1.8* 1.9*  --   --  2.5 2.6  --  3.8   < > = values in this interval not displayed.   GFR: Estimated Creatinine Clearance: 74.8 mL/min (by C-G formula based on SCr of 0.72 mg/dL). Recent Labs  Lab  01/01/18 0323 01/02/18 0528 01/02/18 0816 01/03/18 0500 01/04/18 0349 01/05/18 0404  PROCALCITON <0.10  --  <0.10  --  <0.10  --   WBC 10.8* 10.0  --  10.1 11.5* 11.9*  LATICACIDVEN  --  1.3  --   --   --   --    Liver Function Tests: Recent Labs  Lab 12/30/17 1004 01/01/2018 0450 01/05/18 0404  AST 18 20  --   ALT 14 17  --   ALKPHOS  --  50  --   BILITOT 0.8 0.8  --   PROT 7.7 8.7*  --   ALBUMIN  --  4.3 2.7*   No results for input(s): LIPASE, AMYLASE in the last 168 hours. No results for input(s): AMMONIA in the last 168 hours. ABG    Component Value Date/Time   PHART 7.451 (H) 01/04/2018 0426   PCO2ART 36.6 01/04/2018 0426   PO2ART 98.0 01/04/2018 0426   HCO3 25.3 01/04/2018 0426   TCO2 26 01/04/2018 0426   ACIDBASEDEF 5.0 (H) 01/01/2018 0333   O2SAT 98.0 01/04/2018 0426    Coagulation Profile: Recent Labs  Lab 01/07/2018 0450 01/01/18 0323  INR 1.12 1.25   Cardiac Enzymes: Recent Labs  Lab 01/01/18 0323 01/03/18 0500  TROPONINI 0.03* 0.03*   HbA1C: Hgb A1c MFr Bld  Date/Time Value Ref Range Status  12/30/2017 10:04 AM 8.4 (H) <5.7 % of total Hgb Final    Comment:    For someone without known diabetes, a hemoglobin A1c value of 6.5% or greater indicates that they may have  diabetes and this should be confirmed with a follow-up  test. . For someone with known diabetes, a value <7% indicates  that their diabetes is well controlled and a value  greater than or equal to 7% indicates suboptimal  control. A1c targets should be individualized based on  duration of diabetes, age, comorbid conditions, and  other considerations. . Currently, no consensus exists regarding use of hemoglobin A1c for diagnosis of diabetes for children. Marland Kitchen   08/09/2017 09:03 AM 9.2 (H) <5.7 % of total Hgb Final    Comment:    For someone without known diabetes, a hemoglobin A1c value of 6.5% or greater indicates that they may have  diabetes and this should be confirmed with  a follow-up  test. . For someone with known diabetes, a value <7% indicates  that their diabetes is well controlled and a value  greater than or equal to 7% indicates suboptimal  control. A1c targets should be individualized based on  duration of diabetes, age, comorbid conditions, and  other considerations. . Currently, no consensus exists regarding use of hemoglobin A1c for diagnosis of diabetes for children. .    CBG: Recent Labs  Lab 01/04/18 1530 01/04/18 1929 01/04/18 2337 01/05/18 0326 01/05/18 0729  GLUCAP 204* 146* 141* 185* 160*    Joneen Roach, AGACNP-BC [place] [facility] Pager [phone] or [phone]  01/05/2018 8:28 AM

## 2018-01-06 LAB — CBC
HCT: 33.9 % — ABNORMAL LOW (ref 36.0–46.0)
HEMOGLOBIN: 10.2 g/dL — AB (ref 12.0–15.0)
MCH: 29.8 pg (ref 26.0–34.0)
MCHC: 30.1 g/dL (ref 30.0–36.0)
MCV: 99.1 fL (ref 78.0–100.0)
PLATELETS: 288 10*3/uL (ref 150–400)
RBC: 3.42 MIL/uL — AB (ref 3.87–5.11)
RDW: 13.3 % (ref 11.5–15.5)
WBC: 11.8 10*3/uL — AB (ref 4.0–10.5)

## 2018-01-06 LAB — BASIC METABOLIC PANEL
ANION GAP: 6 (ref 5–15)
BUN: 30 mg/dL — ABNORMAL HIGH (ref 8–23)
CALCIUM: 8.9 mg/dL (ref 8.9–10.3)
CHLORIDE: 108 mmol/L (ref 98–111)
CO2: 30 mmol/L (ref 22–32)
Creatinine, Ser: 0.72 mg/dL (ref 0.44–1.00)
GFR calc non Af Amer: >60 mL/min (ref >60)
Glucose, Bld: 188 mg/dL — ABNORMAL HIGH (ref 70–99)
Potassium: 3.5 mmol/L (ref 3.5–5.1)
SODIUM: 144 mmol/L (ref 135–145)

## 2018-01-06 LAB — GLUCOSE, CAPILLARY
GLUCOSE-CAPILLARY: 169 mg/dL — AB (ref 70–99)
GLUCOSE-CAPILLARY: 192 mg/dL — AB (ref 70–99)
GLUCOSE-CAPILLARY: 176 mg/dL — AB (ref 70–99)
Glucose-Capillary: 178 mg/dL — ABNORMAL HIGH (ref 70–99)

## 2018-01-06 LAB — MAGNESIUM: MAGNESIUM: 2.1 mg/dL (ref 1.7–2.4)

## 2018-01-06 MED ORDER — POTASSIUM CHLORIDE 20 MEQ/15ML (10%) PO SOLN
40.0000 meq | Freq: Two times a day (BID) | ORAL | Status: DC
  Administered 2018-01-06 – 2018-01-09 (×8): 40 meq
  Filled 2018-01-06 (×8): qty 30

## 2018-01-06 NOTE — Progress Notes (Signed)
NAME:  [name] [name], MRN:  [id number], DOB:  [birth year], LOS: 6 ADMISSION DATE:  January 15, 2018, CONSULTATION DATE:  9/20 REFERRING MD: Pearlean Brownie , CHIEF COMPLAINT:   MCA Ischemic stroke/ VDRF  Brief History   72 y.o. female with a history of PVD and CHF as well as afib on eliquis who was last seen by family around 7:30 PM.  She arrived to the ED 9/20 am and she was severely dysarthric with right sided weakness, but  able to understand and nod/shake her head.  She was found to have CVA or R MCA felt to be secondary to Afib. Out of window for tPa but had successful IR revascularization. Course has been complicated by evolving infarct and cerebral edema requiring 3%. Decreased mental status limiting extubation.   Significant Hospital Events   9/20>> Admission 9/20>> CT Brain: Positive CTA for EVLO with occlusive thrombus within the distal basilar artery. 9/21>> 3% saline added at 25cc/hr for Mild increased edema and local mass effect and NA of 136 9/23 >> 3% off, weaned 10 hours on PSV 15/5  Consults: date of consult/date signed off & final recs:  Pulmonary 9/20 IR 9/20 PT/OT/ Speech 9/20 Stroke Team 9/20  Procedures (surgical and bedside):  9/20  SP RT Vertbral artery angiogram followed complete revascularization og occluded mid to distal basilar artery 9/20 ETT  Significant Diagnostic Tests:  9/20>> CT Brain: Positive CTA for EVLO with occlusive thrombus within the distal basilar artery. 01-15-2018 Echo:  Extremely limited; EF 35-40%, definity used; apical akinesis; hypokinesis of the basal inferior wall; overall moderate LV dysfunction; moderate LVE; mild LVH; trace AI; mild MR; moderate LAE. 01/15/2018 TTE : Extremely limited; definity used; apical akinesis; hypokinesis of  the basal inferior wall; overall moderate LV dysfunction;  moderate LVE; mild LVH; trace AI; mild MR; moderate LAE. CT head 9/21 >> Subacute infarct involving left occipital lobe, left SCA distribution of cerebellum, and  right thalamus. Mild increased edema and local mass effect. No herniation or associated hemorrhage. 9/24 CT head > Evolving acute to subacute LEFT greater than RIGHT cerebellar, RIGHT thalamus and LEFT occipital posterior circulation infarcts. No hemorrhagic conversion. No new infarct. Mild mass effect on fourth ventricle, no obstructive hydrocephalus.  Micro Data: 12/07/2017>> Recent toe infection>>STAPHYLOCOCCUS AUREUS   9/20 MRSA PCR >> neg Blood 9/25 >>>  Antimicrobials:  9/20 cefazolin (pre-op)  Subjective:  Weaned well yesterday. Still following some basic commands but no definite improvement.   Objective   Blood pressure (!) 133/54, pulse 68, temperature 99.4 F (37.4 C), temperature source Axillary, resp. rate (!) 26, height 5\' 1"  (1.549 m), weight 109.3 kg, SpO2 99 %.    Vent Mode: PCV FiO2 (%):  [30 %] 30 % Set Rate:  [15 bmp] 15 bmp PEEP:  [5 cmH20] 5 cmH20 Pressure Support:  [8 cmH20] 8 cmH20 Plateau Pressure:  [15 cmH20-16 cmH20] 16 cmH20   Intake/Output Summary (Last 24 hours) at 01/06/2018 0738 Last data filed at 01/06/2018 0600 Gross per 24 hour  Intake 1189.99 ml  Output 2000 ml  Net -810.01 ml   Filed Weights   01/04/18 0352 01/05/18 0404 01/06/18 0500  Weight: 108.5 kg 109.2 kg 109.3 kg    Examination: General:  Elderly female in NAD on vent HEENT: [place]/AT. R pupil 3mm irregular and fixed. Left pupil 2mm and minimally reactive to light. MMM Neuro: Wiggles toes and grasps hands weakly. Weakest RUE.  CV: Paced. RRR, no MRG  PULM: Clear bilateral breath sounds GI: soft, obese, BS  active  Extremities: warm/dry, no BLE edema, L great toe wound with dressing CDI.  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 S/p IR revascularization on 9/20. Complicated by cerebral edema now s/p hypertonic saline.  Follow up CT 9/24 shows evolving infarcts. Off sedation since 9/20 and remains slow to demonstrate any meaningful improvement. Plan: Neurology primary SBP goal < Neurochecks  Continue asa, brilinta and lipitor   PT/OT/SLP when able Need to pursue discussion with family/stroke service as to trach vs one way extubation.  Acute Respiratory Insufficiency in setting of Cerebral infarction  Plan: Daily SBT, tolerating well today. Mental status limits extubation. Likely a good candidate for early trach if family feels this is the appropriate course.  Day 7/x of ETT Albuterol nebs prn  Atrial Fibrillation on Eliquis  Hx. CHF, Pulmonary HTN, Bi Ventricular pacemeaker, PVD - TTE 12/23/2017 w/ EF 35-40%;  Limited study; apical akinesis; hypokinesis of  the basal inferior wall; overall moderate LV dysfunction;   moderate LVE; mild LVH; trace AI; mild MR; moderate LAE. Plan: Continue home vasotec, coreg Increase to Home dose lasix PO with KCL 40 meq BID Prn labetalol  Strict I/O's/ daily weights/ trending renal panel/ mag daily   Hypokalemia P:  Daily KCL   On Eliquis at home - normal coags on admit, some question of compliance Plan SCDs/ heparin SQ for VTE Resume Eliquis 9/27 Trending CBC  Leukocytosis-  Recent toe infection 11/2017 + for STAPHYLOCOCCUS AUREUS. Now having low grade fevers and continued mild leukocytosis Plan: -  blood cx 9/25  DM - Changes made 9/25 result in consistent readings under 180 P:  CBG q4 SSI resistant Continue TF coverage 5 units q 4 per Diabetes RN recs Lantus 20 BID  Hypothyroid P:  Continue synthroid po Recheck TSH in late october  Elevated Triglycerides - Most likely 2/2 Cleviprex/ propofol, which have been stopped.  Plan: Lipitor as above   Disposition / Summary of Today's Plan 01/06/18   No real change today. Continue to support on vent. Tolerating wean well. Ready to decide trach vs one way extubation. Family has  alluded they would not want her to require long term support, but no clear direction as of yet. Have indicated for RN to call when family arrives.     Diet: TF at goal per dietitian  Pain/Anxiety/Delirium protocol : as needed VAP protocol: yes DVT prophylaxis: SQ heparin, SCDs GI prophylaxis: Protonix  Hyperglycemia protocol: CBG/ SSI/ lantus Mobility:BR Code Status: FC Family Communication: RN to call when family arrives.   Labs   CBC: Recent Labs  Lab 01/09/2018 0450  01/01/18 0323 01/02/18 0528 01/03/18 0500 01/04/18 0349 01/05/18 0404 01/06/18 0347  WBC 11.6*  --  10.8* 10.0 10.1 11.5* 11.9* 11.8*  NEUTROABS 7.8*  --  8.0*  --   --   --   --   --   HGB 14.2   < > 10.4* 10.6* 10.3* 10.2* 10.2* 10.2*  HCT 44.0   < > 34.0* 35.7* 33.8* 33.7* 33.8* 33.9*  MCV 95.0  --  96.3 100.3* 98.3 98.0 98.3 99.1  PLT 378  --  310 265 272 264 286 288   < > = values in this interval not displayed.   Basic Metabolic Panel: Recent Labs  Lab 01/02/18 0816 01/02/18 1430 01/02/18  1700  01/03/18 0209 01/03/18 0500 01/03/18 1629 01/04/18 0349 01/05/18 0404 01/06/18 0347  NA 141 142  --    < > 144 144  --  144 145 144  K 4.3  --   --   --   --  3.7  --  3.1* 3.4* 3.5  CL 114*  --   --   --   --  112*  --  109 108 108  CO2 18*  --   --   --   --  24  --  26 27 30   GLUCOSE 252*  --   --   --   --  267*  --  197* 182* 188*  BUN 12  --   --   --   --  19  --  22 25* 30*  CREATININE 0.77  --   --   --   --  0.73  --  0.73 0.72 0.72  CALCIUM 8.0*  --   --   --   --  8.4*  --  8.7* 8.9 8.9  MG 2.2 1.9 2.0  --   --  1.9 1.9 1.8 1.8 2.1  PHOS  --  1.8* 1.9*  --   --  2.5 2.6  --  3.8  --    < > = values in this interval not displayed.   GFR: Estimated Creatinine Clearance: 74.8 mL/min (by C-G formula based on SCr of 0.72 mg/dL). Recent Labs  Lab 01/01/18 0323 01/02/18 0528 01/02/18 0816 01/03/18 0500 01/04/18 0349 01/05/18 0404 01/06/18 0347  PROCALCITON <0.10  --  <0.10  --  <0.10   --   --   WBC 10.8* 10.0  --  10.1 11.5* 11.9* 11.8*  LATICACIDVEN  --  1.3  --   --   --   --   --    Liver Function Tests: Recent Labs  Lab 12/30/17 1004 11-Jan-2018 0450 01/05/18 0404  AST 18 20  --   ALT 14 17  --   ALKPHOS  --  50  --   BILITOT 0.8 0.8  --   PROT 7.7 8.7*  --   ALBUMIN  --  4.3 2.7*   No results for input(s): LIPASE, AMYLASE in the last 168 hours. No results for input(s): AMMONIA in the last 168 hours. ABG    Component Value Date/Time   PHART 7.451 (H) 01/04/2018 0426   PCO2ART 36.6 01/04/2018 0426   PO2ART 98.0 01/04/2018 0426   HCO3 25.3 01/04/2018 0426   TCO2 26 01/04/2018 0426   ACIDBASEDEF 5.0 (H) 01/01/2018 0333   O2SAT 98.0 01/04/2018 0426    Coagulation Profile: Recent Labs  Lab 2018/01/11 0450 01/01/18 0323  INR 1.12 1.25   Cardiac Enzymes: Recent Labs  Lab 01/01/18 0323 01/03/18 0500  TROPONINI 0.03* 0.03*   HbA1C: Hgb A1c MFr Bld  Date/Time Value Ref Range Status  12/30/2017 10:04 AM 8.4 (H) <5.7 % of total Hgb Final    Comment:    For someone without known diabetes, a hemoglobin A1c value of 6.5% or greater indicates that they may have  diabetes and this should be confirmed with a follow-up  test. . For someone with known diabetes, a value <7% indicates  that their diabetes is well controlled and a value  greater than or equal to 7% indicates suboptimal  control. A1c targets should be individualized based on  duration of diabetes, age, comorbid conditions, and  other considerations. . Currently,  no consensus exists regarding use of hemoglobin A1c for diagnosis of diabetes for children. Marland Kitchen   08/09/2017 09:03 AM 9.2 (H) <5.7 % of total Hgb Final    Comment:    For someone without known diabetes, a hemoglobin A1c value of 6.5% or greater indicates that they may have  diabetes and this should be confirmed with a follow-up  test. . For someone with known diabetes, a value <7% indicates  that their diabetes is well  controlled and a value  greater than or equal to 7% indicates suboptimal  control. A1c targets should be individualized based on  duration of diabetes, age, comorbid conditions, and  other considerations. . Currently, no consensus exists regarding use of hemoglobin A1c for diagnosis of diabetes for children. .    CBG: Recent Labs  Lab 01/05/18 1146 01/05/18 1540 01/05/18 1920 01/05/18 2336 01/06/18 0322  GLUCAP 213* 207* 174* 177* 178*    Joneen Roach, AGACNP-BC [facility] Pager [phone] or [phone]  01/06/2018 7:38 AM

## 2018-01-06 NOTE — Progress Notes (Signed)
OT Cancellation Note  Patient Details Name: [name] [name] MRN: [id number] DOB: 05/22/1945   Cancelled Treatment:    Reason Eval/Treat Not Completed: Other (comment). Discussed with nursing who states family is considering comfort care vs trach. OT signing off at this time. Please reorder when appropriate. thanks  [facility] Ceferino Lang, OT/L   Acute OT Clinical Specialist Acute Rehabilitation Services Pager [phone] Office [phone]  01/06/2018, 3:29 PM

## 2018-01-06 NOTE — Progress Notes (Signed)
NEUROHOSPITALISTS STROKE TEAM - DAILY PROGRESS NOTE   SUBJECTIVE Patient RN is at bedside.  No family seen today. Patient still intubated and off sedation.  Still lethargic today and barely following commands, no significant change from yesterday. Not able to extubate. Had low grade fever 100.4 this am.   OBJECTIVE Most recent Vital Signs: Vitals:   01/06/18 1200 01/06/18 1300 01/06/18 1400 01/06/18 1500  BP: (!) 136/50 (!) 116/47 (!) 126/55 (!) 113/49  Pulse: 70 71 70 70  Resp: (!) 27 (!) 26 (!) 27 (!) 28  Temp:      TempSrc:      SpO2: 100% 98% 99% 99%  Weight:      Height:       CBG (last 3)  Recent Labs    01/06/18 0829 01/06/18 1105 01/06/18 1543  GLUCAP 169* 192* 176*    Physical Exam  HEENT-intubated.   Cardiovascular- irregularly irregular heart rhythm Lungs-vented breath sounds.  Saturations within normal limits Abdomen-obese Musculoskeletal: Pulses weak in right lower extremities, only audible with Doppler and +1 in left lower extremity Skin-warm and dry.  Left groin dressing clean dry and intact no hematoma noted  Neuro:  Intubated off sedation. Eyes closed but barely open on voice or pain.  Lethargic, barely follows simple commands.  PERRL, fixed middle position, not lateral gaze. Corneal weakly positive b/l. Weak gag and cough. Not blinking to visual threat. Facial symmetry not able to test due to ET tube. LUE 3-/5 proximal and distal and following commands. BLE mild withdraw to pain but able to wiggle toes bilaterally. RUE flaccid. DTR 1+ and bilateral babinski. Sensation, coordination and gait not tested.  Lipid Panel    Component Value Date/Time   CHOL 135 01/01/2018 0323   TRIG 206 (H) 01/01/2018 0323   HDL 21 (L) 01/01/2018 0323   CHOLHDL 6.4 01/01/2018 0323   VLDL 41 (H) 01/01/2018 0323   LDLCALC 73 01/01/2018 0323   LDLCALC 118 (H) 08/09/2017 0903   HgbA1C  Lab Results  Component Value Date     HGBA1C 8.4 (H) 12/30/2017    Urine Drug Screen:      Component Value Date/Time   LABOPIA NONE DETECTED 12/16/2017 1747   COCAINSCRNUR NONE DETECTED 12/29/2017 1747   LABBENZ NONE DETECTED 12/30/2017 1747   AMPHETMU NONE DETECTED 12/14/2017 1747   THCU NONE DETECTED 12/17/2017 1747   LABBARB NONE DETECTED 12/27/2017 1747    Alcohol Level:  Recent Labs  Lab 01/08/2018 0450  ETH <10    Ct Angio Head/ Neck W Or Wo Contrast/ CT Perfusion  12/30/2017 IMPRESSION:  1. 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 Superior cerebellar arteries not visualized. PCAs are perfused at this time via collateralization from small posterior communicating arteries. 2. No acute core infarct identified by CT perfusion. 3. Approximate 50% stenosis at the origin of the right vertebral artery. Right vertebral artery dominant and otherwise widely patent to the vertebrobasilar junction. 4. Poor opacification of the distal left V2 segment, which may be occluded. Reconstitution at the V3 segment, which remains patent to the cranial vault. Multifocal moderate V4 stenoses as above. 5. Atheromatous stenoses of up to approximately 70-80%  about the carotid bifurcations bilaterally. Evaluation limited by prominent calcifications and motion artifact on this exam. 6. 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 7. 60% stenosis at the origin of the left subclavian artery. 8. Patchy multifocal consolidative opacities within the visualized lungs, concerning for pneumonia. Evidence for underlying pulmonary interstitial edema.   Dg Chest 1 View 2018/01/14 IMPRESSION: Tube positions as described without pneumothorax. Pulmonary vascular congestion. Mild perihilar pulmonary edema. No frank consolidation. Left base atelectasis noted. Postoperative changes noted. Electronically Signed   By: Bretta Bang III M.D.   On: 01/14/2018 12:16   Ct Head Wo Contrast  14-Jan-2018 IMPRESSION: 1. Involving nonhemorrhagic infarct involving the left occipital lobe is described. This is consistent with acute left PCA occlusion. 2. No other acute or focal cortical or basal ganglia infarct evident. 3. Moderate diffuse white matter disease. 4. Anterior ethmoid and frontal sinus disease may be related to intubation.  EKG :A. fib /a flutter  Ct Head Wo Contrast  Result Date: 01/01/2018 CLINICAL DATA:  72 y/o  F; stroke for follow-up. EXAM: CT HEAD WITHOUT CONTRAST TECHNIQUE: Contiguous axial images were obtained from the base of the skull through the vertex without intravenous contrast. COMPARISON:  01-14-18 CT head, CTA head, CT perfusion head. FINDINGS: Brain: Subacute infarct involving the left occipital lobe, left SCA distribution of cerebellum, and right thalamus with mild edema and local mass effect. No associated hemorrhage. No additional focus of stroke, hemorrhage, or focal mass effect identified. No extra-axial collection, hydrocephalus, or effacement of basilar cisterns. Stable background of chronic microvascular ischemic changes and volume loss of the brain. Vascular: Tip of basilar to right PCA stent in situ. Skull: Normal. Negative for fracture or focal lesion. Sinuses/Orbits: No acute finding. Other: Bilateral intra-ocular lens replacement. IMPRESSION: Subacute infarct involving left occipital lobe, left SCA distribution of cerebellum, and right thalamus. Mild increased edema and local mass effect. No herniation or associated hemorrhage. Electronically Signed   By: Mitzi Hansen M.D.   On: 01/01/2018 02:36   Dg Chest Port 1 View  Result Date: 01/01/2018 CLINICAL DATA:  Respiratory failure EXAM: PORTABLE CHEST 1 VIEW COMPARISON:  01-14-18 FINDINGS: Endotracheal tube terminates 3.5 cm above the carina. Mild perihilar edema, right lower lobe predominant. Less likely, multifocal right lung infection is possible. No definite pleural effusions. No  pneumothorax. Heart is top-normal in size. Postsurgical changes related to prior CABG. Left subclavian ICD. Enteric tube courses below the diaphragm. IMPRESSION: Endotracheal tube terminates 3.5 cm above the carina. Suspected mild perihilar edema, right lower lobe predominant. Less likely, multifocal right lung infection is possible. Postoperative changes with additional support apparatus as above. Electronically Signed   By: Charline Bills M.D.   On: 01/01/2018 07:07   Ct Head Wo Contrast  Result Date: 01/04/2018 CLINICAL DATA:  Sluggish asymmetric pupils.  History of stroke. EXAM: CT HEAD WITHOUT CONTRAST TECHNIQUE: Contiguous axial images were obtained from the base of the skull through the vertex without intravenous contrast. COMPARISON:  CT HEAD January 01, 2018 FINDINGS: BRAIN: Evolving acute LEFT superior cerebellar infarct and LEFT subacute mesial occipital lobe infarct. Evolving subacute RIGHT small cerebellar infarcts. Evolving RIGHT mesial thalamus infarct with new petechial hemorrhage. Associated regional mass effect without midline shift. No intraparenchymal hemorrhage. Moderate parenchymal brain volume loss. No hydrocephalus. Patchy supratentorial white matter hypodensities compatible with mild chronic small vessel ischemic changes. VASCULAR: Severe calcific atherosclerosis of the carotid siphons. Status post basilar artery to RIGHT PCA pipeline stent. SKULL: No skull fracture. No significant  scalp soft tissue swelling. SINUSES/ORBITS: Trace paranasal sinus mucosal thickening. Mastoid air cells are well aerated.The included ocular globes and orbital contents are non-suspicious. Status post bilateral ocular lens implants. OTHER: Life support lines in place. IMPRESSION: 1. Evolving acute to subacute LEFT greater than RIGHT cerebellar, RIGHT thalamus and LEFT occipital posterior circulation infarcts. No hemorrhagic conversion. No new infarct. 2. Mild mass effect on fourth ventricle, no  obstructive hydrocephalus. Electronically Signed   By: Awilda Metro M.D.   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   CTA head & neck : Positive CTA for evolving LVO with occlusive thrombus within the distal basilar artery. Superior cerebellar arteries not visualized. Approximate 50% stenosis at the origin of the right vertebral artery,  Atheromatous stenoses of up to approximately 70-80% about the carotid bifurcations bilaterally. 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   MRI of the brain : not performed due to pacemaker  Repeat Head CT 9/20: Evolving Left PCA nonhemorrhagic infarct.   Repeat CT head 9/21 - left PCA, left SCA and right thalamic infarcts  Repeat CT 9/24 - bilateral cerebellum, right thalamus, left occipital infarcts, no hydrocephalus.  2D Echocardiogram EF 35 to 40%  LDL 118  HgbA1c 8.4  VTE: heparin subq  Antiplatelets : Aspirin 81 mg daily/Brilinta 90 mg twice daily due to stenting  Continue Rehab with PT consult, OT consult, Speech consult  Therapy recommendations:   Pending  Disposition:   Pending  Will need family discussion regarding trach vs. One way extubation vs. Comfort care    Atrial Fibrillation  Home anticoagulation:   Eliquis  Home medications, Betapace   CHA2DS2-VASc Score = 6             Age in Years:  ?35                         Sex:  Female   Female                      Hypertension History:  yes                          Diabetes Mellitus:  0             Congestive Heart Failure History:  0             Vascular Disease History:  yes                             Stroke/TIA/Thromboembolism History:  yes     Meds: Coreg  Due to multifocal infarcts with moderate size on  CT, will plan to resume eliquis in 7 days post stroke and if no procedure planned. Pending family discussion.    Respiratory failure  Intubated off sedation  CCM on board  Tolerated weaning trial so far  However, mental status and brainstem reflexes not enough for extubation  May need trach vs. One way extubation vs. Comfort care  Hypertension urgency  Stable off cleviprex  BP goal < 160 post procedure  Home medications: Enalapril, Coreg  Hyperlipidemia  Home meds:   None  LDL 118, goal < 70  on  lipitor 80mg  daily  Continue statin at discharge   Uncontrolled diabetes mellitus  A1C 8.4, goal < 7.0  SSI  CBG monitoring  Hyperglycemia much improved  Continue lantus 20U bid, and 5units NovoLog every 4 hours for tube feeding coverage  DM coordinator recs appreciated  CHF/CAD s/p CABG  Currently on aspirin and Brilinta post cardiac stent  Continue coreg and home lasix  Avoid fluid overload  Low grade fever  Could be reason for her lethargy  Tmax 100.5->100.4  UA neg  CXR no frank penumonia  Stable leukocytosis 11.5->11.9->11.8  blood culture pending  Dysphagia  On tube feeding  CBG every 4 hours  NovoLog coverage  Other Stroke Risk Factors  Advanced age  Former smoker  PVD   Other Active Problems  GERD-continue PPI  Hypothyroidism-continue Synthroid   Hospital day # 6  This patient is critically ill due to posterior infarcts, dysphagia, respiratory failure, BA occlusion s/p IR and at significant risk of neurological worsening, death form recurrent strokes, hemorrhagic conversion, cerebral edema, dysphagia, aspiration, seizure. This patient's care requires constant monitoring of vital signs, hemodynamics, respiratory and cardiac monitoring, review of multiple databases, neurological assessment, discussion with family, other specialists and medical decision making of high complexity. I spent 35 minutes of neurocritical care time in  the care of this patient. I had long discussion with daughter and son at bedside, updated pt current condition, treatment plan and potential prognosis. They expressed understanding and appreciation.    Marvel Plan, MD PhD Stroke Neurology 01/06/2018 3:52 PM     To contact Stroke Continuity provider, please refer to [email or web address]. After hours, contact General Neurology

## 2018-01-06 NOTE — Progress Notes (Signed)
PT Cancellation Note  Patient Details Name: [name] [name] MRN: [id number] DOB: [birth year]   Cancelled Treatment:    Reason Eval/Treat Not Completed: Patient not medically ready.  Family is deciding on trach vs comfort care.  Will sign off at this time and await new order if family and MD wish to take a more aggressive stance. 01/06/2018  Sullivan Bing, PT Acute Rehabilitation Services [phone]  (pager) [phone]  (office)   Eliseo Gum Alvina Strother 01/06/2018, 3:28 PM

## 2018-01-07 ENCOUNTER — Inpatient Hospital Stay (HOSPITAL_COMMUNITY): Payer: Medicare Other

## 2018-01-07 DIAGNOSIS — R509 Fever, unspecified: Secondary | ICD-10-CM

## 2018-01-07 DIAGNOSIS — D72829 Elevated white blood cell count, unspecified: Secondary | ICD-10-CM

## 2018-01-07 LAB — BASIC METABOLIC PANEL
ANION GAP: 9 (ref 5–15)
BUN: 38 mg/dL — ABNORMAL HIGH (ref 8–23)
CO2: 29 mmol/L (ref 22–32)
Calcium: 9.3 mg/dL (ref 8.9–10.3)
Chloride: 109 mmol/L (ref 98–111)
Creatinine, Ser: 0.86 mg/dL (ref 0.44–1.00)
GFR calc Af Amer: >60 mL/min (ref >60)
GFR calc non Af Amer: >60 mL/min (ref >60)
Glucose, Bld: 231 mg/dL — ABNORMAL HIGH (ref 70–99)
POTASSIUM: 4 mmol/L (ref 3.5–5.1)
Sodium: 147 mmol/L — ABNORMAL HIGH (ref 135–145)

## 2018-01-07 LAB — GLUCOSE, CAPILLARY
GLUCOSE-CAPILLARY: 180 mg/dL — AB (ref 70–99)
GLUCOSE-CAPILLARY: 216 mg/dL — AB (ref 70–99)
GLUCOSE-CAPILLARY: 261 mg/dL — AB (ref 70–99)
GLUCOSE-CAPILLARY: 223 mg/dL — AB (ref 70–99)
GLUCOSE-CAPILLARY: 210 mg/dL — AB (ref 70–99)
Glucose-Capillary: 186 mg/dL — ABNORMAL HIGH (ref 70–99)
Glucose-Capillary: 218 mg/dL — ABNORMAL HIGH (ref 70–99)
Glucose-Capillary: 180 mg/dL — ABNORMAL HIGH (ref 70–99)

## 2018-01-07 LAB — CBC
HEMATOCRIT: 36.4 % (ref 36.0–46.0)
Hemoglobin: 10.7 g/dL — ABNORMAL LOW (ref 12.0–15.0)
MCH: 29.5 pg (ref 26.0–34.0)
MCHC: 29.4 g/dL — ABNORMAL LOW (ref 30.0–36.0)
MCV: 100.3 fL — AB (ref 78.0–100.0)
PLATELETS: 319 10*3/uL (ref 150–400)
RBC: 3.63 MIL/uL — AB (ref 3.87–5.11)
RDW: 13.3 % (ref 11.5–15.5)
WBC: 13.4 10*3/uL — AB (ref 4.0–10.5)

## 2018-01-07 MED ORDER — FREE WATER
200.0000 mL | Freq: Three times a day (TID) | Status: DC
  Administered 2018-01-07 – 2018-01-09 (×9): 200 mL

## 2018-01-07 NOTE — Plan of Care (Signed)
  Patient is tolerating tube feeds at goal rate.   

## 2018-01-07 NOTE — Progress Notes (Signed)
NEUROHOSPITALISTS STROKE TEAM - DAILY PROGRESS NOTE   SUBJECTIVE Patient adopted daughter and Dr. Craige Cotta is at bedside. Dr. Craige Cotta talked with son yesterday and he would like to observe 2-3 more days before making decision. Adopted daughter stated that pt son had rough night last night and struggling with decision. Pt this morning, more lethargic than yesterday, followed only limited commands. Exhausted being on CPAP, now switched to PS.   OBJECTIVE Most recent Vital Signs: Vitals:   01/07/18 0600 01/07/18 0700 01/07/18 0800 01/07/18 0900  BP: (!) 131/51 (!) 134/52 (!) 129/52 (!) 133/51  Pulse: 70 70 70 70  Resp: (!) 26 (!) 28 (!) 28 (!) 30  Temp:   (!) 100.8 F (38.2 C)   TempSrc:   Axillary   SpO2: 96% 96% 98% 98%  Weight:      Height:       CBG (last 3)  Recent Labs    01/06/18 1105 01/06/18 1543 01/07/18 0455  GLUCAP 192* 176* 186*    Physical Exam  HEENT-intubated.   Cardiovascular- irregularly irregular heart rhythm Lungs-vented breath sounds.  Saturations within normal limits Abdomen-obese Musculoskeletal: Pulses weak in right lower extremities, only audible with Doppler and +1 in left lower extremity Skin-warm and dry.  Left groin dressing clean dry and intact no hematoma noted  Neuro:  Intubated off sedation. Eyes closed but barely open on voice or pain.  Lethargic, barely follows limited simple commands.  PERRL, fixed middle position, not lateral gaze. Corneal weakly positive b/l. Weak gag and cough. Not blinking to visual threat. Facial symmetry not able to test due to ET tube. LUE mild withdraw to pain and flaccid on the RUE. BLE mild withdraw to pain but able to wiggle toes on the right. DTR diminished and bilateral babinski. Sensation, coordination and gait not tested.  Lipid Panel    Component Value Date/Time   CHOL 135 01/01/2018 0323   TRIG 206 (H) 01/01/2018 0323   HDL 21 (L) 01/01/2018 0323   CHOLHDL  6.4 01/01/2018 0323   VLDL 41 (H) 01/01/2018 0323   LDLCALC 73 01/01/2018 0323   LDLCALC 118 (H) 08/09/2017 0903   HgbA1C  Lab Results  Component Value Date   HGBA1C 8.4 (H) 12/30/2017    Urine Drug Screen:      Component Value Date/Time   LABOPIA NONE DETECTED 01-11-2018 1747   COCAINSCRNUR NONE DETECTED 2018/01/11 1747   LABBENZ NONE DETECTED 11-Jan-2018 1747   AMPHETMU NONE DETECTED Jan 11, 2018 1747   THCU NONE DETECTED 11-Jan-2018 1747   LABBARB NONE DETECTED January 11, 2018 1747    Alcohol Level:  No results for input(s): ETH in the last 168 hours.  Ct Angio Head/ Neck W Or Wo Contrast/ CT Perfusion  2018-01-11 IMPRESSION:  1. 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 Superior cerebellar arteries not visualized. PCAs are perfused at this time via collateralization from small posterior communicating arteries.  2. No acute core infarct identified by CT perfusion.  3. Approximate 50% stenosis at the origin of the right vertebral artery. Right vertebral artery dominant and otherwise widely patent to the vertebrobasilar junction.  4. Poor opacification of the distal left V2 segment, which  may be occluded. Reconstitution at the V3 segment, which remains patent to the cranial vault. Multifocal moderate V4 stenoses as above.  5. Atheromatous stenoses of up to approximately 70-80% about the carotid bifurcations bilaterally. Evaluation limited by prominent calcifications and motion artifact on this exam.  6. 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  7. 60% stenosis at the origin of the left subclavian artery.  8. Patchy multifocal consolidative opacities within the visualized lungs, concerning for pneumonia. Evidence for underlying pulmonary interstitial edema.   Dg Chest 1 View 12/22/2017 IMPRESSION:  Tube positions as described without pneumothorax. Pulmonary vascular congestion. Mild perihilar pulmonary edema.  No frank consolidation. Left base atelectasis noted. Postoperative changes noted.    Ct Head Wo Contrast 01/02/2018 IMPRESSION:  1. Involving nonhemorrhagic infarct involving the left occipital lobe is described. This is consistent with acute left PCA occlusion.  2. No other acute or focal cortical or basal ganglia infarct evident.  3. Moderate diffuse white matter disease.  4. Anterior ethmoid and frontal sinus disease may be related to intubation.  EKG :A. fib /a flutter  Ct Head Wo Contrast 01/01/2018 IMPRESSION:  Subacute infarct involving left occipital lobe, left SCA distribution of cerebellum, and right thalamus. Mild increased edema and local mass effect. No herniation or associated hemorrhage.    Dg Chest Port 1 View 01/01/2018 IMPRESSION:  Endotracheal tube terminates 3.5 cm above the carina. Suspected mild perihilar edema, right lower lobe predominant. Less likely, multifocal right lung infection is possible. Postoperative changes with additional support apparatus as above.   Ct Head Wo Contrast 01/04/2018 IMPRESSION:  1. Evolving acute to subacute LEFT greater than RIGHT cerebellar, RIGHT thalamus and LEFT occipital posterior circulation infarcts. No hemorrhagic conversion. No new infarct.  2. Mild mass effect on fourth ventricle, no obstructive hydrocephalus.       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   CTA head & neck : Positive CTA for evolving LVO with occlusive thrombus within the distal basilar artery. Superior cerebellar arteries not visualized. Approximate 50% stenosis at the origin of the right vertebral artery,  Atheromatous stenoses of up to approximately 70-80% about the carotid bifurcations bilaterally. Extensive carotid siphon atherosclerosis with moderate  diffuse narrowing bilaterally.   MRI of the brain : not performed due to pacemaker  Repeat Head CT 9/20: Evolving Left PCA nonhemorrhagic infarct.   Repeat CT head 9/21 - left PCA, left SCA and right thalamic infarcts  Repeat CT 9/24 - bilateral cerebellum, right thalamus, left occipital infarcts, no hydrocephalus.  2D Echocardiogram EF 35 to 40%  LDL 118  HgbA1c 8.4  VTE: heparin subq  Antiplatelets : Aspirin 81 mg daily/Brilinta 90 mg twice daily due to stenting  Continue Rehab with PT consult, OT consult, Speech consult  Therapy recommendations:   Pending  Disposition:   Pending  Dr. Craige Cotta discussed with son yesterday and he is leaning to one way extubation if no improvement for the next 2-3 days. However, at meantime, will continue current management.   Atrial Fibrillation  Home anticoagulation:   Eliquis  Home medications, Betapace   CHA2DS2-VASc Score = 6             Age in Years:  ?29  Sex:  Female   Female                      Hypertension History:  yes                          Diabetes Mellitus:  0             Congestive Heart Failure History:  0             Vascular Disease History:  yes                             Stroke/TIA/Thromboembolism History:  yes     Meds: Coreg  Decision on resuming AC will based on treatment plan and family discussion.    Respiratory failure  Intubated off sedation  CCM on board  Tolerated weaning trial so far  However, mental status and brainstem reflexes not enough for extubation  May need trach vs. One way extubation vs. Comfort care  Hypertension urgency  Stable off cleviprex  BP goal < 160 post procedure  Home medications: Enalapril, Coreg  Hyperlipidemia  Home meds:   None  LDL 118, goal < 70  on lipitor 80mg  daily  Continue statin at discharge   Uncontrolled diabetes mellitus  A1C 8.4, goal < 7.0  SSI  CBG monitoring  Hyperglycemia much improved, glucose  170-190  Continue lantus 20U bid, and 5units NovoLog every 4 hours for tube feeding coverage  DM coordinator recs appreciated  Dietitian is to consider change TF formula to glucerna  CHF/CAD s/p CABG  Currently on aspirin and Brilinta post cardiac stent  Continue coreg and home lasix  CCM on board  Low grade fever  Could be reason for her lethargy  Tmax 100.5->100.4-> 100.8  UA neg  CXR no frank penumonia  Stable leukocytosis 11.5->11.9->11.8->13.4  blood culture pending - No growth 2 days   CCM on board  Dysphagia  On tube feeding  CBG every 4 hours  NovoLog coverage  Other Stroke Risk Factors  Advanced age  Former smoker  PVD   Other Active Problems  GERD-continue PPI  Hypothyroidism-continue Synthroid   Hospital day # 7  This patient is critically ill due to posterior infarcts, dysphagia, respiratory failure, BA occlusion s/p IR and at significant risk of neurological worsening, death form recurrent strokes, hemorrhagic conversion, cerebral edema, dysphagia, aspiration, seizure. This patient's care requires constant monitoring of vital signs, hemodynamics, respiratory and cardiac monitoring, review of multiple databases, neurological assessment, discussion with family, other specialists and medical decision making of high complexity. I spent 35 minutes of neurocritical care time in the care of this patient. I had long discussion with daughter and son at bedside, updated pt current condition, treatment plan and potential prognosis. They expressed understanding and appreciation.    Marvel Plan, MD PhD Stroke Neurology 01/07/2018 10:53 AM    To contact Stroke Continuity provider, please refer to [email or web address]. After hours, contact General Neurology

## 2018-01-07 NOTE — Progress Notes (Signed)
NAME:  [name] [name], MRN:  [id number], DOB:  1945-10-12, LOS: 7 ADMISSION DATE:  01-20-18, CONSULTATION DATE:  9/20 REFERRING MD: Pearlean Brownie , CHIEF COMPLAINT:   MCA Ischemic stroke/ VDRF  Brief History   72 y.o. female with a history of PVD and CHF as well as afib on eliquis who was last seen by family around 7:30 PM.  She arrived to the ED 9/20 am and she was severely dysarthric with right sided weakness, but  able to understand and nod/shake her head.  She was found to have CVA or R MCA felt to be secondary to Afib. Out of window for tPa but had successful IR revascularization. Course has been complicated by evolving infarct and cerebral edema requiring 3%. Decreased mental status limiting extubation. Family considering trach vs one way extubation.  Significant Hospital Events   9/20>> Admission 9/20>> CT Brain: Positive CTA for EVLO with occlusive thrombus within the distal basilar artery. 9/21>> 3% saline added at 25cc/hr for Mild increased edema and local mass effect and NA of 136 9/23 >> 3% off, weaned 10 hours on PSV 15/5 9/26 > continues to wean well. Mental status limits extubation. Discuss with son GOC. He is considering.   Consults: date of consult/date signed off & final recs:  Pulmonary 9/20 IR 9/20 PT/OT/ Speech 9/20 Stroke Team 9/20  Procedures (surgical and bedside):  9/20  SP RT Vertbral artery angiogram followed complete revascularization og occluded mid to distal basilar artery 9/20 ETT  Significant Diagnostic Tests:  9/20>> CT Brain: Positive CTA for EVLO with occlusive thrombus within the distal basilar artery. January 20, 2018 Echo:  Extremely limited; EF 35-40%, definity used; apical akinesis; hypokinesis of the basal inferior wall; overall moderate LV dysfunction; moderate LVE; mild LVH; trace AI; mild MR; moderate LAE. Jan 20, 2018 TTE : Extremely limited; definity used; apical akinesis; hypokinesis of  the basal inferior wall; overall moderate LV dysfunction;  moderate  LVE; mild LVH; trace AI; mild MR; moderate LAE. CT head 9/21 >> Subacute infarct involving left occipital lobe, left SCA distribution of cerebellum, and right thalamus. Mild increased edema and local mass effect. No herniation or associated hemorrhage. 9/24 CT head > Evolving acute to subacute LEFT greater than RIGHT cerebellar, RIGHT thalamus and LEFT occipital posterior circulation infarcts. No hemorrhagic conversion. No new infarct. Mild mass effect on fourth ventricle, no obstructive hydrocephalus.  Micro Data: 12/07/2017>> Recent toe infection>>STAPHYLOCOCCUS AUREUS   9/20 MRSA PCR >> neg Blood 9/25 >>>  Antimicrobials:  9/20 cefazolin (pre-op)  Subjective:  Continues to wean well. No meaningful change in exam.   Objective   Blood pressure (!) 134/52, pulse 70, temperature 99.9 F (37.7 C), temperature source Axillary, resp. rate (!) 28, height 5\' 1"  (1.549 m), weight 105.9 kg, SpO2 96 %.    Vent Mode: PSV;CPAP FiO2 (%):  [30 %] 30 % Set Rate:  [15 bmp] 15 bmp Vt Set:  [450 mL] 450 mL PEEP:  [5 cmH20] 5 cmH20 Pressure Support:  [8 cmH20] 8 cmH20 Plateau Pressure:  [12 cmH20-17 cmH20] 12 cmH20   Intake/Output Summary (Last 24 hours) at 01/07/2018 0824 Last data filed at 01/07/2018 0700 Gross per 24 hour  Intake 1148.76 ml  Output 2300 ml  Net -1151.24 ml   Filed Weights   01/05/18 0404 01/06/18 0500 01/07/18 0500  Weight: 109.2 kg 109.3 kg 105.9 kg    Examination:  General:  Elderly female in NAD on vent HEENT: [place]/AT. No appreciable JVD Neuro: Continues to follow commands intermittently.  CV:  Paced. RRR, no MRG  PULM: Clear bilateral breath sounds. Tachypneic on SBT this morning.  GI: soft, obese, BS active  Extremities: warm/dry, no BLE edema, L great toe wound with dressing CDI.  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 S/p IR revascularization on 9/20. Complicated by cerebral edema now s/p hypertonic saline. Follow up CT 9/24 shows evolving infarcts. Off sedation since 9/20 and remains slow to demonstrate any meaningful improvement. Plan: Neurology primary SBP goal < Neurochecks  Continue asa, brilinta and lipitor   PT/OT/SLP when able Son considering as to trach vs one way extubation.  Acute Respiratory Insufficiency in setting of Cerebral infarction  Plan: Daily SBT, tolerating well today. Mental status limits extubation. Likely a good candidate for early trach if family feels this is the appropriate course.  Day 7/x of ETT Albuterol nebs prn  Atrial Fibrillation on Eliquis  Hx. CHF, Pulmonary HTN, Bi Ventricular pacemeaker, PVD - TTE 01/04/2018 w/ EF 35-40%;  Limited study; apical akinesis; hypokinesis of  the basal inferior wall; overall moderate LV dysfunction;   moderate LVE; mild LVH; trace AI; mild MR; moderate LAE. Plan: Continue home vasotec, coreg Increase to Home dose lasix PO with KCL 40 meq BID Resume eliquis today per neuro. Will continue to hold as it seems like a trach decision is forthcoming.  Prn labetalol Strict I/O's/ daily weights/ trending renal panel/ mag daily  Hypokalemia P:  Daily KCL   Leukocytosis-  Recent toe infection 11/2017 + for STAPHYLOCOCCUS AUREUS. Now having low grade fevers and continued mild leukocytosis Plan: -  blood cx 9/25  DM - Glucose creeping up despite lantus, scheduled short acting, and SSI.  P:  CBG q4 SSI resistant Continue TF coverage 5 units q 4 per Diabetes RN recs Lantus 20 BID RN to discuss TF change options with dietician.   Hypothyroid P:  Continue synthroid po Recheck TSH in late October  Hypernatremia - start free water  Elevated Triglycerides - Most likely 2/2 Cleviprex/ propofol, which have been stopped.  Plan: Lipitor as above   Disposition / Summary of Today's Plan 01/07/18    Continue SBT as tolerated. 7 days on vent at this point. Awaiting family decision on plan of care.     Diet: TF  Pain/Anxiety/Delirium protocol : as needed VAP protocol: yes DVT prophylaxis: SQ heparin, SCDs GI prophylaxis: Protonix  Hyperglycemia protocol: CBG/ SSI/ lantus Mobility:BR Code Status: FC Family Communication: Son updated 9/26. No family bedside today.   Labs   CBC: Recent Labs  Lab 01/01/18 0323  01/03/18 0500 01/04/18 0349 01/05/18 0404 01/06/18 0347 01/07/18 0558  WBC 10.8*   < > 10.1 11.5* 11.9* 11.8* 13.4*  NEUTROABS 8.0*  --   --   --   --   --   --   HGB 10.4*   < > 10.3* 10.2* 10.2* 10.2* 10.7*  HCT 34.0*   < > 33.8* 33.7* 33.8* 33.9* 36.4  MCV 96.3   < > 98.3 98.0 98.3 99.1 100.3*  PLT 310   < > 272 264 286 288 319   < > = values in this interval not displayed.   Basic Metabolic Panel: Recent Labs  Lab 01/02/18 1430 01/02/18 1700  01/03/18 0500 01/03/18 1629 01/04/18 0349 01/05/18 0404 01/06/18 0347 01/07/18 0558  NA 142  --    < >  144  --  144 145 144 147*  K  --   --   --  3.7  --  3.1* 3.4* 3.5 4.0  CL  --   --   --  112*  --  109 108 108 109  CO2  --   --   --  24  --  26 27 30 29   GLUCOSE  --   --   --  267*  --  197* 182* 188* 231*  BUN  --   --   --  19  --  22 25* 30* 38*  CREATININE  --   --   --  0.73  --  0.73 0.72 0.72 0.86  CALCIUM  --   --   --  8.4*  --  8.7* 8.9 8.9 9.3  MG 1.9 2.0  --  1.9 1.9 1.8 1.8 2.1  --   PHOS 1.8* 1.9*  --  2.5 2.6  --  3.8  --   --    < > = values in this interval not displayed.   GFR: Estimated Creatinine Clearance: 68.2 mL/min (by C-G formula based on SCr of 0.86 mg/dL). Recent Labs  Lab 01/01/18 0323 01/02/18 0528 01/02/18 0816  01/04/18 0349 01/05/18 0404 01/06/18 0347 01/07/18 0558  PROCALCITON <0.10  --  <0.10  --  <0.10  --   --   --   WBC 10.8* 10.0  --    < > 11.5* 11.9* 11.8* 13.4*  LATICACIDVEN  --  1.3  --   --   --   --   --   --    < > = values in this interval not  displayed.   Liver Function Tests: Recent Labs  Lab 01/05/18 0404  ALBUMIN 2.7*   No results for input(s): LIPASE, AMYLASE in the last 168 hours. No results for input(s): AMMONIA in the last 168 hours. ABG    Component Value Date/Time   PHART 7.451 (H) 01/04/2018 0426   PCO2ART 36.6 01/04/2018 0426   PO2ART 98.0 01/04/2018 0426   HCO3 25.3 01/04/2018 0426   TCO2 26 01/04/2018 0426   ACIDBASEDEF 5.0 (H) 01/01/2018 0333   O2SAT 98.0 01/04/2018 0426    Coagulation Profile: Recent Labs  Lab 01/01/18 0323  INR 1.25   Cardiac Enzymes: Recent Labs  Lab 01/01/18 0323 01/03/18 0500  TROPONINI 0.03* 0.03*   HbA1C: Hgb A1c MFr Bld  Date/Time Value Ref Range Status  12/30/2017 10:04 AM 8.4 (H) <5.7 % of total Hgb Final    Comment:    For someone without known diabetes, a hemoglobin A1c value of 6.5% or greater indicates that they may have  diabetes and this should be confirmed with a follow-up  test. . For someone with known diabetes, a value <7% indicates  that their diabetes is well controlled and a value  greater than or equal to 7% indicates suboptimal  control. A1c targets should be individualized based on  duration of diabetes, age, comorbid conditions, and  other considerations. . Currently, no consensus exists regarding use of hemoglobin A1c for diagnosis of diabetes for children. Marland Kitchen   08/09/2017 09:03 AM 9.2 (H) <5.7 % of total Hgb Final    Comment:    For someone without known diabetes, a hemoglobin A1c value of 6.5% or greater indicates that they may have  diabetes and this should be confirmed with a follow-up  test. . For someone with known diabetes, a value <7% indicates  that their diabetes is well controlled and a value  greater than or equal to 7% indicates suboptimal  control. A1c targets should be individualized based on  duration of diabetes, age, comorbid conditions, and  other considerations. . Currently, no consensus exists regarding use  of hemoglobin A1c for diagnosis of diabetes for children. .    CBG: Recent Labs  Lab 01/06/18 0322 01/06/18 0829 01/06/18 1105 01/06/18 1543 01/07/18 0455  GLUCAP 178* 169* 192* 176* 186*    Joneen Roach, AGACNP-BC [facility] Pager [phone] or [phone]  01/07/2018 8:24 AM

## 2018-01-07 NOTE — Progress Notes (Signed)
I asked Dietician about modifying pts tube feed to glucerna given the high blood sugars. Pt currently on Vital HP, which has the lowest carb amount. We will leave current order in place and continue to treat CBGs accordingly.

## 2018-01-08 DIAGNOSIS — I6302 Cerebral infarction due to thrombosis of basilar artery: Secondary | ICD-10-CM

## 2018-01-08 DIAGNOSIS — Z978 Presence of other specified devices: Secondary | ICD-10-CM

## 2018-01-08 DIAGNOSIS — I651 Occlusion and stenosis of basilar artery: Secondary | ICD-10-CM

## 2018-01-08 LAB — CBC
HCT: 34.7 % — ABNORMAL LOW (ref 36.0–46.0)
Hemoglobin: 10.3 g/dL — ABNORMAL LOW (ref 12.0–15.0)
MCH: 29.9 pg (ref 26.0–34.0)
MCHC: 29.7 g/dL — AB (ref 30.0–36.0)
MCV: 100.6 fL — AB (ref 78.0–100.0)
PLATELETS: 323 10*3/uL (ref 150–400)
RBC: 3.45 MIL/uL — ABNORMAL LOW (ref 3.87–5.11)
RDW: 13.4 % (ref 11.5–15.5)
WBC: 12.7 10*3/uL — ABNORMAL HIGH (ref 4.0–10.5)

## 2018-01-08 LAB — GLUCOSE, CAPILLARY
Glucose-Capillary: 192 mg/dL — ABNORMAL HIGH (ref 70–99)
Glucose-Capillary: 152 mg/dL — ABNORMAL HIGH (ref 70–99)
Glucose-Capillary: 216 mg/dL — ABNORMAL HIGH (ref 70–99)
Glucose-Capillary: 203 mg/dL — ABNORMAL HIGH (ref 70–99)
Glucose-Capillary: 193 mg/dL — ABNORMAL HIGH (ref 70–99)

## 2018-01-08 LAB — COMPREHENSIVE METABOLIC PANEL
ALT: 26 U/L (ref 0–44)
AST: 45 U/L — ABNORMAL HIGH (ref 15–41)
Albumin: 2.6 g/dL — ABNORMAL LOW (ref 3.5–5.0)
Alkaline Phosphatase: 37 U/L — ABNORMAL LOW (ref 38–126)
Anion gap: 6 (ref 5–15)
BUN: 38 mg/dL — ABNORMAL HIGH (ref 8–23)
CO2: 30 mmol/L (ref 22–32)
Calcium: 9.2 mg/dL (ref 8.9–10.3)
Chloride: 113 mmol/L — ABNORMAL HIGH (ref 98–111)
Creatinine, Ser: 0.78 mg/dL (ref 0.44–1.00)
GFR calc Af Amer: >60 mL/min (ref >60)
GFR calc non Af Amer: >60 mL/min (ref >60)
Glucose, Bld: 180 mg/dL — ABNORMAL HIGH (ref 70–99)
Potassium: 4 mmol/L (ref 3.5–5.1)
Sodium: 149 mmol/L — ABNORMAL HIGH (ref 135–145)
Total Bilirubin: 0.9 mg/dL (ref 0.3–1.2)
Total Protein: 6.5 g/dL (ref 6.5–8.1)

## 2018-01-08 LAB — MAGNESIUM: Magnesium: 2.4 mg/dL (ref 1.7–2.4)

## 2018-01-08 LAB — PHOSPHORUS: Phosphorus: 3.3 mg/dL (ref 2.5–4.6)

## 2018-01-08 MED ORDER — FENTANYL BOLUS VIA INFUSION
25.0000 ug | INTRAVENOUS | Status: DC | PRN
  Administered 2018-01-10: 25 ug via INTRAVENOUS
  Filled 2018-01-08: qty 100

## 2018-01-08 MED ORDER — FENTANYL CITRATE (PF) 100 MCG/2ML IJ SOLN
25.0000 ug | INTRAMUSCULAR | Status: DC | PRN
  Administered 2018-01-08: 50 ug via INTRAVENOUS
  Filled 2018-01-08: qty 2

## 2018-01-08 MED ORDER — CIPROFLOXACIN HCL 0.3 % OP SOLN
2.0000 [drp] | Freq: Four times a day (QID) | OPHTHALMIC | Status: DC
  Administered 2018-01-08 – 2018-01-10 (×8): 2 [drp] via OPHTHALMIC
  Filled 2018-01-08: qty 2.5

## 2018-01-08 MED ORDER — FENTANYL NICU BOLUS VIA INFUSION: 25.0000 ug | INTRAVENOUS | Status: DC | PRN

## 2018-01-08 MED ORDER — FENTANYL 2500MCG IN NS 250ML (10MCG/ML) PREMIX INFUSION
0.0000 ug/h | INTRAVENOUS | Status: DC
  Administered 2018-01-08: 25 ug/h via INTRAVENOUS
  Administered 2018-01-10: 100 ug/h via INTRAVENOUS
  Filled 2018-01-08 (×2): qty 250

## 2018-01-08 NOTE — Progress Notes (Signed)
NAME:  [name] [name], MRN:  [id number], DOB:  [birth year], LOS: 8 ADMISSION DATE:  01/12/18, CONSULTATION DATE:  9/20 REFERRING MD: Pearlean Brownie , CHIEF COMPLAINT:   MCA Ischemic stroke/ VDRF  Brief History   72 y.o. female with a history of PVD and CHF as well as afib on eliquis who was last seen by family around 7:30 PM.  She arrived to the ED 9/20 am and she was severely dysarthric with right sided weakness, but  able to understand and nod/shake her head.  She was found to have CVA or R MCA felt to be secondary to Afib. Out of window for tPa but had successful IR revascularization. Course has been complicated by evolving infarct and cerebral edema requiring 3%. Decreased mental status limiting extubation.   Significant Hospital Events   9/20>> Admission 9/20>> CT Brain: Positive CTA for EVLO with occlusive thrombus within the distal basilar artery. 9/21>> 3% saline added at 25cc/hr for Mild increased edema and local mass effect and NA of 136 9/23 >> 3% off, weaned 10 hours on PSV 15/5  Consults: date of consult/date signed off & final recs:  Pulmonary 9/20 IR 9/20 PT/OT/ Speech 9/20 Stroke Team 9/20  Procedures (surgical and bedside):  9/20  SP RT Vertbral artery angiogram followed complete revascularization og occluded mid to distal basilar artery 9/20 ETT  Significant Diagnostic Tests:  9/20>> CT Brain: Positive CTA for EVLO with occlusive thrombus within the distal basilar artery. Jan 12, 2018 Echo:  Extremely limited; EF 35-40%, definity used; apical akinesis; hypokinesis of the basal inferior wall; overall moderate LV dysfunction; moderate LVE; mild LVH; trace AI; mild MR; moderate LAE. January 12, 2018 TTE : Extremely limited; definity used; apical akinesis; hypokinesis of  the basal inferior wall; overall moderate LV dysfunction;  moderate LVE; mild LVH; trace AI; mild MR; moderate LAE. CT head 9/21 >> Subacute infarct involving left occipital lobe, left SCA distribution of cerebellum, and  right thalamus. Mild increased edema and local mass effect. No herniation or associated hemorrhage. 9/24 CT head > Evolving acute to subacute LEFT greater than RIGHT cerebellar, RIGHT thalamus and LEFT occipital posterior circulation infarcts. No hemorrhagic conversion. No new infarct. Mild mass effect on fourth ventricle, no obstructive hydrocephalus.  Micro Data: 12/07/2017>> Recent toe infection>>STAPHYLOCOCCUS AUREUS   9/20 MRSA PCR >> neg Blood 9/25 >>>  Antimicrobials:  9/20 cefazolin (pre-op)  Subjective:  Weaned well yesterday. Still following some basic commands but no definite improvement.   Objective   Blood pressure (!) 116/51, pulse 70, temperature 98.4 F (36.9 C), temperature source Axillary, resp. rate (!) 27, height 5\' 1"  (1.549 m), weight 106.2 kg, SpO2 99 %.    Vent Mode: PSV;CPAP FiO2 (%):  [30 %] 30 % Set Rate:  [15 bmp] 15 bmp PEEP:  [5 cmH20] 5 cmH20 Pressure Support:  [5 cmH20] 5 cmH20 Plateau Pressure:  [15 cmH20-18 cmH20] 18 cmH20   Intake/Output Summary (Last 24 hours) at 01/08/2018 0811 Last data filed at 01/08/2018 0600 Gross per 24 hour  Intake 1492.79 ml  Output 1500 ml  Net -7.21 ml   Filed Weights   01/06/18 0500 01/07/18 0500 01/08/18 0500  Weight: 109.3 kg 105.9 kg 106.2 kg    Examination: General:  Elderly female in NAD on vent HEENT: Gerty/AT. R pupil 3mm irregular and fixed. Left pupil 2mm and minimally reactive to light. MMM Neuro: Wiggles toes and grasps hands weakly. Weakest RUE.  CV: Paced. RRR, no MRG  PULM: Clear bilateral breath sounds GI: soft, obese, BS  active  Extremities: warm/dry, no BLE edema, L great toe wound with dressing CDI.  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 S/p IR revascularization on 9/20. Complicated by cerebral edema now s/p hypertonic saline.  Follow up CT 9/24 shows evolving infarcts. Off sedation since 9/20 and remains slow to demonstrate any meaningful improvement. Plan: Neurology primary SBP goal < Neurochecks  Continue asa, brilinta and lipitor   PT/OT/SLP when able Need to pursue discussion with family/stroke service as to trach vs one way extubation.  Acute Respiratory Insufficiency in setting of Cerebral infarction  Plan: Daily SBT, tolerating well today. Mental status limits extubation. Likely a good candidate for early trach if family feels this is the appropriate course.  Day 7/x of ETT Albuterol nebs prn  Atrial Fibrillation on Eliquis  Hx. CHF, Pulmonary HTN, Bi Ventricular pacemeaker, PVD - TTE 01/06/2018 w/ EF 35-40%;  Limited study; apical akinesis; hypokinesis of  the basal inferior wall; overall moderate LV dysfunction;   moderate LVE; mild LVH; trace AI; mild MR; moderate LAE. Plan: Continue home vasotec, coreg Increase to Home dose lasix PO with KCL 40 meq BID Prn labetalol  Strict I/O's/ daily weights/ trending renal panel/ mag daily   Hypokalemia P:  Daily KCL   On Eliquis at home - normal coags on admit, some question of compliance Plan SCDs/ heparin SQ for VTE Resume Eliquis 9/27 Trending CBC  Leukocytosis-  Recent toe infection 11/2017 + for STAPHYLOCOCCUS AUREUS. Now having low grade fevers and continued mild leukocytosis Plan: -  blood cx 9/25  DM - Changes made 9/25 result in consistent readings under 180 P:  CBG q4 SSI resistant Continue TF coverage 5 units q 4 per Diabetes RN recs Lantus 20 BID  Hypothyroid P:  Continue synthroid po Recheck TSH in late october  Elevated Triglycerides - Most likely 2/2 Cleviprex/ propofol, which have been stopped.  Plan: Lipitor as above   Disposition / Summary of Today's Plan 01/08/18   Patient is more responsive today she is still extremely weak and deconditioned and deep sedation without any sedatives but she is  able to open her eyes to painful stimuli and move her left hand with thumbs up after multiple request she is not ready to be extubated. Continue to support on vent. Tolerating wean well. Ready to decide trach vs one way extubation. Family has alluded they would not want her to require long term support, but no clear direction as of yet.  If the patient does not make significant progress by Monday or Tuesday I suggest to get palliative care and discuss end-of-life issues clarifications for goals of care.    Diet: TF at goal per dietitian  Pain/Anxiety/Delirium protocol : as needed VAP protocol: yes DVT prophylaxis: SQ heparin, SCDs GI prophylaxis: Protonix  Hyperglycemia protocol: CBG/ SSI/ lantus Mobility:BR Code Status: FC Family Communication: RN to call when family arrives.   Labs   CBC: Recent Labs  Lab 01/04/18 0349 01/05/18 0404 01/06/18 0347 01/07/18 0558 01/08/18 0505  WBC 11.5* 11.9* 11.8* 13.4* 12.7*  HGB 10.2* 10.2* 10.2* 10.7* 10.3*  HCT 33.7* 33.8* 33.9* 36.4 34.7*  MCV 98.0 98.3 99.1 100.3* 100.6*  PLT 264 286 288 319 323   Basic Metabolic Panel: Recent Labs  Lab 01/02/18 1700  01/03/18 0500 01/03/18 1629 01/04/18 0349  01/05/18 0404 01/06/18 0347 01/07/18 0558 01/08/18 0505  NA  --    < > 144  --  144 145 144 147* 149*  K  --   --  3.7  --  3.1* 3.4* 3.5 4.0 4.0  CL  --   --  112*  --  109 108 108 109 113*  CO2  --   --  24  --  26 27 30 29 30   GLUCOSE  --   --  267*  --  197* 182* 188* 231* 180*  BUN  --   --  19  --  22 25* 30* 38* 38*  CREATININE  --   --  0.73  --  0.73 0.72 0.72 0.86 0.78  CALCIUM  --   --  8.4*  --  8.7* 8.9 8.9 9.3 9.2  MG 2.0  --  1.9 1.9 1.8 1.8 2.1  --  2.4  PHOS 1.9*  --  2.5 2.6  --  3.8  --   --  3.3   < > = values in this interval not displayed.   GFR: Estimated Creatinine Clearance: 73.5 mL/min (by C-G formula based on SCr of 0.78 mg/dL). Recent Labs  Lab 01/02/18 0528 01/02/18 0816  01/04/18 0349 01/05/18 0404  01/06/18 0347 01/07/18 0558 01/08/18 0505  PROCALCITON  --  <0.10  --  <0.10  --   --   --   --   WBC 10.0  --    < > 11.5* 11.9* 11.8* 13.4* 12.7*  LATICACIDVEN 1.3  --   --   --   --   --   --   --    < > = values in this interval not displayed.   Liver Function Tests: Recent Labs  Lab 01/05/18 0404 01/08/18 0505  AST  --  45*  ALT  --  26  ALKPHOS  --  37*  BILITOT  --  0.9  PROT  --  6.5  ALBUMIN 2.7* 2.6*   No results for input(s): LIPASE, AMYLASE in the last 168 hours. No results for input(s): AMMONIA in the last 168 hours. ABG    Component Value Date/Time   PHART 7.451 (H) 01/04/2018 0426   PCO2ART 36.6 01/04/2018 0426   PO2ART 98.0 01/04/2018 0426   HCO3 25.3 01/04/2018 0426   TCO2 26 01/04/2018 0426   ACIDBASEDEF 5.0 (H) 01/01/2018 0333   O2SAT 98.0 01/04/2018 0426    Coagulation Profile: No results for input(s): INR, PROTIME in the last 168 hours. Cardiac Enzymes: Recent Labs  Lab 01/03/18 0500  TROPONINI 0.03*   HbA1C: Hgb A1c MFr Bld  Date/Time Value Ref Range Status  12/30/2017 10:04 AM 8.4 (H) <5.7 % of total Hgb Final    Comment:    For someone without known diabetes, a hemoglobin A1c value of 6.5% or greater indicates that they may have  diabetes and this should be confirmed with a follow-up  test. . For someone with known diabetes, a value <7% indicates  that their diabetes is well controlled and a value  greater than or equal to 7% indicates suboptimal  control. A1c targets should be individualized based on  duration of diabetes, age, comorbid conditions, and  other considerations. . Currently, no consensus exists regarding use of hemoglobin A1c for diagnosis of diabetes for children. Marland Kitchen   08/09/2017 09:03 AM 9.2 (H) <5.7 % of total Hgb Final    Comment:    For someone without known diabetes,  a hemoglobin A1c value of 6.5% or greater indicates that they may have  diabetes and this should be confirmed with a follow-up  test. . For  someone with known diabetes, a value <7% indicates  that their diabetes is well controlled and a value  greater than or equal to 7% indicates suboptimal  control. A1c targets should be individualized based on  duration of diabetes, age, comorbid conditions, and  other considerations. . Currently, no consensus exists regarding use of hemoglobin A1c for diagnosis of diabetes for children. .    CBG: Recent Labs  Lab 01/07/18 1608 01/07/18 1948 01/07/18 2326 01/08/18 0357 01/08/18 0744  GLUCAP 223* 210* 180* 192* 152*    Melodie Bouillon MD  [facility]  [phone]  01/08/2018 8:11 AM

## 2018-01-08 NOTE — Progress Notes (Signed)
NEUROHOSPITALISTS STROKE TEAM - DAILY PROGRESS NOTE   SUBJECTIVE Patient `s son is at bedside.  Pt this morning, more lethargic than yesterday, followed only limited commands. Still unable to tolerate CPAP for long OBJECTIVE Most recent Vital Signs: Vitals:   01/08/18 1000 01/08/18 1100 01/08/18 1210 01/08/18 1237  BP: (!) 124/44 (!) 115/37 (!) 103/52   Pulse: 69 69 70 70  Resp: (!) 28 (!) 32 (!) 22 (!) 27  Temp:   (!) 100.7 F (38.2 C)   TempSrc:   Oral   SpO2: 99% 100% 98% 100%  Weight:      Height:       CBG (last 3)  Recent Labs    01/08/18 0357 01/08/18 0744 01/08/18 1145  GLUCAP 192* 152* 216*    Physical Exam  HEENT-intubated.   Cardiovascular- irregularly irregular heart rhythm Lungs-vented breath sounds.  Saturations within normal limits Abdomen-obese Musculoskeletal: Pulses weak in right lower extremities, only audible with Doppler and +1 in left lower extremity Skin-warm and dry.  Left groin dressing clean dry and intact no hematoma noted  Neuro:  Intubated off sedation. Eyes closed but barely open on voice or pain.  Lethargic, barely follows limited simple commands.  PERRL, fixed middle position, not lateral gaze. Corneal weakly positive b/l. Weak gag and cough. Not blinking to visual threat. Facial symmetry not able to test due to ET tube. LUE mild withdraw to pain and flaccid on the RUE. BLE mild withdraw to pain but able to wiggle toes on the right. DTR diminished and bilateral babinski. Sensation, coordination and gait not tested.  Lipid Panel    Component Value Date/Time   CHOL 135 01/01/2018 0323   TRIG 206 (H) 01/01/2018 0323   HDL 21 (L) 01/01/2018 0323   CHOLHDL 6.4 01/01/2018 0323   VLDL 41 (H) 01/01/2018 0323   LDLCALC 73 01/01/2018 0323   LDLCALC 118 (H) 08/09/2017 0903   HgbA1C  Lab Results  Component Value Date   HGBA1C 8.4 (H) 12/30/2017    Urine Drug Screen:      Component Value  Date/Time   LABOPIA NONE DETECTED 12/16/2017 1747   COCAINSCRNUR NONE DETECTED 12/19/2017 1747   LABBENZ NONE DETECTED 12/13/2017 1747   AMPHETMU NONE DETECTED 01/06/2018 1747   THCU NONE DETECTED 12/19/2017 1747   LABBARB NONE DETECTED 12/14/2017 1747    Alcohol Level:  No results for input(s): ETH in the last 168 hours.  Ct Angio Head/ Neck W Or Wo Contrast/ CT Perfusion  12/15/2017 IMPRESSION:  1. 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 Superior cerebellar arteries not visualized. PCAs are perfused at this time via collateralization from small posterior communicating arteries.  2. No acute core infarct identified by CT perfusion.  3. Approximate 50% stenosis at the origin of the right vertebral artery. Right vertebral artery dominant and otherwise widely patent to the vertebrobasilar junction.  4. Poor opacification of the distal left V2 segment, which may be occluded. Reconstitution at the V3 segment, which remains patent to the cranial vault. Multifocal moderate V4 stenoses as above.  5. Atheromatous stenoses of up to approximately 70-80% about the carotid bifurcations bilaterally. Evaluation limited by prominent  calcifications and motion artifact on this exam.  6. 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  7. 60% stenosis at the origin of the left subclavian artery.  8. Patchy multifocal consolidative opacities within the visualized lungs, concerning for pneumonia. Evidence for underlying pulmonary interstitial edema.   Dg Chest 1 View January 26, 2018 IMPRESSION:  Tube positions as described without pneumothorax. Pulmonary vascular congestion. Mild perihilar pulmonary edema. No frank consolidation. Left base atelectasis noted. Postoperative changes noted.    Ct Head Wo Contrast 01-26-18 IMPRESSION:  1. Involving nonhemorrhagic infarct involving the left occipital lobe is described. This is consistent  with acute left PCA occlusion.  2. No other acute or focal cortical or basal ganglia infarct evident.  3. Moderate diffuse white matter disease.  4. Anterior ethmoid and frontal sinus disease may be related to intubation.  EKG :A. fib /a flutter  Ct Head Wo Contrast 01/01/2018 IMPRESSION:  Subacute infarct involving left occipital lobe, left SCA distribution of cerebellum, and right thalamus. Mild increased edema and local mass effect. No herniation or associated hemorrhage.    Dg Chest Port 1 View 01/01/2018 IMPRESSION:  Endotracheal tube terminates 3.5 cm above the carina. Suspected mild perihilar edema, right lower lobe predominant. Less likely, multifocal right lung infection is possible. Postoperative changes with additional support apparatus as above.   Ct Head Wo Contrast 01/04/2018 IMPRESSION:  1. Evolving acute to subacute LEFT greater than RIGHT cerebellar, RIGHT thalamus and LEFT occipital posterior circulation infarcts. No hemorrhagic conversion. No new infarct.  2. Mild mass effect on fourth ventricle, no obstructive hydrocephalus.       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   CTA head & neck : Positive CTA for evolving LVO with occlusive thrombus within the distal basilar artery. Superior cerebellar arteries not visualized. Approximate 50% stenosis at the origin of the right vertebral artery,  Atheromatous stenoses of up to approximately 70-80% about the carotid bifurcations bilaterally. 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   MRI of the brain : not performed due to pacemaker  Repeat Head CT 9/20: Evolving Left PCA nonhemorrhagic infarct.   Repeat CT head 9/21 - left PCA, left SCA and right thalamic infarcts  Repeat CT 9/24 -  bilateral cerebellum, right thalamus, left occipital infarcts, no hydrocephalus.  2D Echocardiogram EF 35 to 40%  LDL 118  HgbA1c 8.4  VTE: heparin subq  Antiplatelets : Aspirin 81 mg daily/Brilinta 90 mg twice daily due to stenting  Continue Rehab with PT consult, OT consult, Speech consult  Therapy recommendations:   Pending  Disposition:   Pending  Dr. Craige Cotta discussed with son 01/07/18 and he is leaning to one way extubation if no improvement for the next 2-3 days. However, at meantime, will continue current management.   Atrial Fibrillation  Home anticoagulation:   Eliquis  Home medications, Betapace   CHA2DS2-VASc Score = 6             Age in Years:  ?2                         Sex:  Female   Female                      Hypertension History:  yes  Diabetes Mellitus:  0             Congestive Heart Failure History:  0             Vascular Disease History:  yes                             Stroke/TIA/Thromboembolism History:  yes     Meds: Coreg  Decision on resuming AC will based on treatment plan and family discussion.    Respiratory failure  Intubated off sedation  CCM on board  Tolerated weaning trial so far  However, mental status and brainstem reflexes not enough for extubation  May need trach vs. One way extubation vs. Comfort care  Hypertension urgency  Stable off cleviprex  BP goal < 160 post procedure  Home medications: Enalapril, Coreg  Hyperlipidemia  Home meds:   None  LDL 118, goal < 70  on lipitor 80mg  daily  Continue statin at discharge   Uncontrolled diabetes mellitus  A1C 8.4, goal < 7.0  SSI  CBG monitoring  Hyperglycemia much improved, glucose 170-190  Continue lantus 20U bid, and 5units NovoLog every 4 hours for tube feeding coverage  DM coordinator recs appreciated  Dietitian is to consider change TF formula to glucerna  CHF/CAD s/p CABG  Currently on aspirin and Brilinta post cardiac  stent  Continue coreg and home lasix  CCM on board  Low grade fever  Could be reason for her lethargy  Tmax 100.5->100.4-> 100.8  UA neg  CXR no frank penumonia  Stable leukocytosis 11.5->11.9->11.8->13.4  blood culture pending - No growth 2 days   CCM on board  Dysphagia  On tube feeding  CBG every 4 hours  NovoLog coverage  Other Stroke Risk Factors  Advanced age  Former smoker  PVD   Other Active Problems  GERD-continue PPI  Hypothyroidism-continue Synthroid   Hospital day # 8  I had a long discussion with the patient's son and sister at the bedside and discussed her prognosis and explained that she likely may need a tracheostomy and enteric tube and prolonged stay in rehabilitation and nursing home. The son appears to be unsure whether she would want it. I offered palliative care consult to help him decide hopefully over the next few days. He was in agreement with the plan. Discussed with Dr. Montez Hageman  This patient is critically ill due to posterior infarcts, dysphagia, respiratory failure, BA occlusion s/p IR and at significant risk of neurological worsening, death form recurrent strokes, hemorrhagic conversion, cerebral edema, dysphagia, aspiration, seizure. This patient's care requires constant monitoring of vital signs, hemodynamics, respiratory and cardiac monitoring, review of multiple databases, neurological assessment, discussion with family, other specialists and medical decision making of high complexity. I spent 45 minutes of neurocritical care time in the care of this patient. I had long discussion with daughter and son at bedside, updated pt current condition, treatment plan and potential prognosis. They expressed understanding and appreciation.    Delia Heady, MD Stroke Neurology 01/08/2018 12:53 PM    To contact Stroke Continuity provider, please refer to [email or web address]. After hours, contact General Neurology

## 2018-01-08 NOTE — Progress Notes (Signed)
After speaking with Dr. Pearlean Brownie and patient's family a conclusion was made to proceed with terminal extubation. Patient's son, Vincenza Hews who is the POA is agreeable to this plan for either Sunday or Monday. At this time this RN re-affirmed POA's wishes to make no further life saving measures thus making the patient a DNR. Dr. Pearlean Brownie called and made aware, verbal order given to make patient a DNR.

## 2018-01-09 ENCOUNTER — Inpatient Hospital Stay (HOSPITAL_COMMUNITY): Payer: Medicare Other

## 2018-01-09 DIAGNOSIS — I6312 Cerebral infarction due to embolism of basilar artery: Secondary | ICD-10-CM

## 2018-01-09 DIAGNOSIS — E782 Mixed hyperlipidemia: Secondary | ICD-10-CM

## 2018-01-09 DIAGNOSIS — E1165 Type 2 diabetes mellitus with hyperglycemia: Secondary | ICD-10-CM

## 2018-01-09 DIAGNOSIS — Z515 Encounter for palliative care: Secondary | ICD-10-CM

## 2018-01-09 LAB — COMPREHENSIVE METABOLIC PANEL
ALT: 31 U/L (ref 0–44)
AST: 55 U/L — ABNORMAL HIGH (ref 15–41)
Albumin: 2.5 g/dL — ABNORMAL LOW (ref 3.5–5.0)
Alkaline Phosphatase: 40 U/L (ref 38–126)
Anion gap: 8 (ref 5–15)
BUN: 47 mg/dL — ABNORMAL HIGH (ref 8–23)
CO2: 28 mmol/L (ref 22–32)
Calcium: 9.2 mg/dL (ref 8.9–10.3)
Chloride: 114 mmol/L — ABNORMAL HIGH (ref 98–111)
Creatinine, Ser: 1.02 mg/dL — ABNORMAL HIGH (ref 0.44–1.00)
GFR, EST NON AFRICAN AMERICAN: 54 mL/min — AB (ref >60)
Glucose, Bld: 208 mg/dL — ABNORMAL HIGH (ref 70–99)
POTASSIUM: 4.3 mmol/L (ref 3.5–5.1)
Sodium: 150 mmol/L — ABNORMAL HIGH (ref 135–145)
Total Bilirubin: 1.1 mg/dL (ref 0.3–1.2)
Total Protein: 6.8 g/dL (ref 6.5–8.1)

## 2018-01-09 LAB — CBC WITH DIFFERENTIAL/PLATELET
BASOS PCT: 1 %
Basophils Absolute: 0.2 10*3/uL — ABNORMAL HIGH (ref 0.0–0.1)
EOS PCT: 1 %
Eosinophils Absolute: 0.2 10*3/uL (ref 0.0–0.7)
HCT: 36.8 % (ref 36.0–46.0)
Hemoglobin: 10.7 g/dL — ABNORMAL LOW (ref 12.0–15.0)
LYMPHS ABS: 1.5 10*3/uL (ref 0.7–4.0)
Lymphocytes Relative: 7 %
MCH: 29.4 pg (ref 26.0–34.0)
MCHC: 29.1 g/dL — ABNORMAL LOW (ref 30.0–36.0)
MCV: 101.1 fL — AB (ref 78.0–100.0)
MONO ABS: 2.1 10*3/uL — AB (ref 0.1–1.0)
Monocytes Relative: 10 %
Neutro Abs: 16.8 10*3/uL — ABNORMAL HIGH (ref 1.7–7.7)
Neutrophils Relative %: 81 %
Platelets: 339 10*3/uL (ref 150–400)
RBC: 3.64 MIL/uL — ABNORMAL LOW (ref 3.87–5.11)
RDW: 13.5 % (ref 11.5–15.5)
WBC: 20.8 10*3/uL — AB (ref 4.0–10.5)

## 2018-01-09 LAB — BLOOD GAS, ARTERIAL
Acid-Base Excess: 4.1 mmol/L — ABNORMAL HIGH (ref 0.0–2.0)
BICARBONATE: 27.8 mmol/L (ref 20.0–28.0)
Drawn by: 50222
FIO2: 0.3
O2 Saturation: 96.9 %
PO2 ART: 84.3 mmHg (ref 83.0–108.0)
PRESSURE CONTROL: 10 cmH2O
Patient temperature: 98.6
RATE: 15 resp/min
pCO2 arterial: 39.9 mmHg (ref 32.0–48.0)
pH, Arterial: 7.457 — ABNORMAL HIGH (ref 7.350–7.450)

## 2018-01-09 LAB — APTT: APTT: 35 s (ref 24–36)

## 2018-01-09 LAB — GLUCOSE, CAPILLARY
GLUCOSE-CAPILLARY: 170 mg/dL — AB (ref 70–99)
GLUCOSE-CAPILLARY: 203 mg/dL — AB (ref 70–99)
GLUCOSE-CAPILLARY: 227 mg/dL — AB (ref 70–99)
GLUCOSE-CAPILLARY: 258 mg/dL — AB (ref 70–99)
Glucose-Capillary: 173 mg/dL — ABNORMAL HIGH (ref 70–99)
Glucose-Capillary: 198 mg/dL — ABNORMAL HIGH (ref 70–99)
Glucose-Capillary: 237 mg/dL — ABNORMAL HIGH (ref 70–99)

## 2018-01-09 LAB — PROTIME-INR
INR: 1.32
Prothrombin Time: 16.3 seconds — ABNORMAL HIGH (ref 11.4–15.2)

## 2018-01-09 LAB — MAGNESIUM: Magnesium: 2.3 mg/dL (ref 1.7–2.4)

## 2018-01-09 MED ORDER — SODIUM CHLORIDE 0.9 % IV BOLUS
500.0000 mL | Freq: Once | INTRAVENOUS | Status: AC
  Administered 2018-01-09: 500 mL via INTRAVENOUS

## 2018-01-09 NOTE — Progress Notes (Addendum)
eLink Physician-Brief Progress Note Patient Name: [name] [name] DOB: 04/04/1946 MRN: [id number]   Date of Service  01/09/2018  HPI/Events of Note  Pt dnr and fm considering w/d care 9/30 but in meantime dropped bp and all sedation held   Intake/Output Summary (Last 24 hours) at 01/09/2018 2014 Last data filed at 01/09/2018 1900 Gross per 24 hour  Intake 2363.91 ml  Output 775 ml  Net 1588.91 ml     eICU Interventions  NS 500 cc Adjust  fio2 to sats > 90% Dc coreg/ vasotec No pressors in this setting     Intervention Category Major Interventions: Hypotension - evaluation and management  Sandrea Hughs 01/09/2018, 8:13 PM

## 2018-01-09 NOTE — Progress Notes (Signed)
eLink Physician-Brief Progress Note Patient Name: [name] [name] DOB: 1946-03-10 MRN: [id number]   Date of Service  01/09/2018  HPI/Events of Note  Worsening hypotension Chart reviewed, poor prognosis, family plans one way extubation tomorrow  eICU Interventions  Hold further intervention     Intervention Category Major Interventions: Hypotension - evaluation and management  Max Fickle 01/09/2018, 9:50 PM

## 2018-01-09 NOTE — Progress Notes (Signed)
NAME:  [name] [name], MRN:  [id number], DOB:  Mar 07, 1946, LOS: 9 ADMISSION DATE:  12/14/2017, CONSULTATION DATE:  9/20 REFERRING MD: Pearlean Brownie , CHIEF COMPLAINT:   MCA Ischemic stroke/ VDRF  Brief History   72 y.o. female with a history of PVD and CHF as well as afib on eliquis who was last seen by family around 7:30 PM.  She arrived to the ED 9/20 am and she was severely dysarthric with right sided weakness, but  able to understand and nod/shake her head.  She was found to have CVA or R MCA felt to be secondary to Afib. Out of window for tPa but had successful IR revascularization. Course has been complicated by evolving infarct and cerebral edema requiring 3%. Decreased mental status limiting extubation.  After the family meeting yesterday patient is DNR no escalation of care to remain alone with extubation either today or tomorrow  Significant Hospital Events   9/20>> Admission 9/20>> CT Brain: Positive CTA for EVLO with occlusive thrombus within the distal basilar artery. 9/21>> 3% saline added at 25cc/hr for Mild increased edema and local mass effect and NA of 136 9/23 >> 3% off, weaned 10 hours on PSV 15/5  Consults: date of consult/date signed off & final recs:  Pulmonary 9/20 IR 9/20 PT/OT/ Speech 9/20 Stroke Team 9/20  Procedures (surgical and bedside):  9/20  SP RT Vertbral artery angiogram followed complete revascularization og occluded mid to distal basilar artery 9/20 ETT  Significant Diagnostic Tests:  9/20>> CT Brain: Positive CTA for EVLO with occlusive thrombus within the distal basilar artery. 01/05/2018 Echo:  Extremely limited; EF 35-40%, definity used; apical akinesis; hypokinesis of the basal inferior wall; overall moderate LV dysfunction; moderate LVE; mild LVH; trace AI; mild MR; moderate LAE. 01/08/2018 TTE : Extremely limited; definity used; apical akinesis; hypokinesis of  the basal inferior wall; overall moderate LV dysfunction;  moderate LVE; mild LVH; trace AI;  mild MR; moderate LAE. CT head 9/21 >> Subacute infarct involving left occipital lobe, left SCA distribution of cerebellum, and right thalamus. Mild increased edema and local mass effect. No herniation or associated hemorrhage. 9/24 CT head > Evolving acute to subacute LEFT greater than RIGHT cerebellar, RIGHT thalamus and LEFT occipital posterior circulation infarcts. No hemorrhagic conversion. No new infarct. Mild mass effect on fourth ventricle, no obstructive hydrocephalus.  Micro Data: 12/07/2017>> Recent toe infection>>STAPHYLOCOCCUS AUREUS   9/20 MRSA PCR >> neg Blood 9/25 >>>  Antimicrobials:  9/20 cefazolin (pre-op)  Subjective:  Weaned well yesterday. Still following some basic commands but no definite improvement.   Objective   Blood pressure (!) 107/56, pulse 70, temperature 100.3 F (37.9 C), temperature source Axillary, resp. rate (!) 33, height 5\' 1"  (1.549 m), weight 101.7 kg, SpO2 97 %.    Vent Mode: PCV FiO2 (%):  [30 %] 30 % Set Rate:  [15 bmp] 15 bmp PEEP:  [5 cmH20] 5 cmH20 Plateau Pressure:  [16 cmH20] 16 cmH20   Intake/Output Summary (Last 24 hours) at 01/09/2018 0746 Last data filed at 01/09/2018 0700 Gross per 24 hour  Intake 1804.73 ml  Output 1140 ml  Net 664.73 ml   Filed Weights   01/07/18 0500 01/08/18 0500 01/09/18 0400  Weight: 105.9 kg 106.2 kg 101.7 kg    Examination: General:  Elderly female in NAD on vent HEENT: [place]/AT. R pupil 3mm irregular and fixed. Left pupil 2mm and minimally reactive to light. MMM Neuro: Wiggles toes and grasps hands weakly. Weakest RUE.  CV: Paced.  RRR, no MRG  PULM: Clear bilateral breath sounds GI: soft, obese, BS active  Extremities: warm/dry, no BLE edema, L great toe wound with dressing CDI.  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 S/p IR  revascularization on 9/20. Complicated by cerebral edema now s/p hypertonic saline. Follow up CT 9/24 shows evolving infarcts. Off sedation since 9/20 and remains slow to demonstrate any meaningful improvement. Plan: Neurology primary SBP goal < Neurochecks  Continue asa, brilinta and lipitor   PT/OT/SLP when able Need to pursue discussion with family/stroke service as to trach vs one way extubation.  Acute Respiratory Insufficiency in setting of Cerebral infarction  Plan: Daily SBT, tolerating well today. Mental status limits extubation. Likely a good candidate for early trach if family feels this is the appropriate course.  Day 7/x of ETT Albuterol nebs prn  Atrial Fibrillation on Eliquis  Hx. CHF, Pulmonary HTN, Bi Ventricular pacemeaker, PVD - TTE Jan 08, 2018 w/ EF 35-40%;  Limited study; apical akinesis; hypokinesis of  the basal inferior wall; overall moderate LV dysfunction;   moderate LVE; mild LVH; trace AI; mild MR; moderate LAE. Plan: Continue home vasotec, coreg Increase to Home dose lasix PO with KCL 40 meq BID Prn labetalol  Strict I/O's/ daily weights/ trending renal panel/ mag daily   Hypokalemia P:  Daily KCL   On Eliquis at home - normal coags on admit, some question of compliance Plan SCDs/ heparin SQ for VTE Resume Eliquis 9/27 Trending CBC  Leukocytosis-  Recent toe infection 11/2017 + for STAPHYLOCOCCUS AUREUS. Now having low grade fevers and continued mild leukocytosis Plan: -  blood cx 9/25  DM - Changes made 9/25 result in consistent readings under 180 P:  CBG q4 SSI resistant Continue TF coverage 5 units q 4 per Diabetes RN recs Lantus 20 BID  Hypothyroid P:  Continue synthroid po Recheck TSH in late october  Elevated Triglycerides - Most likely 2/2 Cleviprex/ propofol, which have been stopped.  Plan: Lipitor as above   Disposition / Summary of Today's Plan 01/09/18   Family meeting happened yesterday patient is DNR  1 week extubation either today or tomorrow no escalation of care will continue with symptomatic treatment for the fever antibiotics and fentanyl for tachypnea.    Diet: TF at goal per dietitian  Pain/Anxiety/Delirium protocol : as needed VAP protocol: yes DVT prophylaxis: SQ heparin, SCDs GI prophylaxis: Protonix  Hyperglycemia protocol: CBG/ SSI/ lantus Mobility:BR Code Status: FC Family Communication: RN to call when family arrives.   Labs   CBC: Recent Labs  Lab 01/05/18 0404 01/06/18 0347 01/07/18 0558 01/08/18 0505 01/09/18 0434  WBC 11.9* 11.8* 13.4* 12.7* 20.8*  NEUTROABS  --   --   --   --  16.8*  HGB 10.2* 10.2* 10.7* 10.3* 10.7*  HCT 33.8* 33.9* 36.4 34.7* 36.8  MCV 98.3 99.1 100.3* 100.6* 101.1*  PLT 286 288 319 323 339   Basic Metabolic Panel: Recent Labs  Lab 01/02/18 1700  01/03/18 0500 01/03/18 1629 01/04/18 0349 01/05/18 0404 01/06/18 0347 01/07/18 0558 01/08/18 0505 01/09/18 0434  NA  --    < > 144  --  144 145 144 147* 149* 150*  K  --   --  3.7  --  3.1* 3.4* 3.5 4.0 4.0 4.3  CL  --   --  112*  --  109 108 108 109 113* 114*  CO2  --   --  24  --  26 27 30 29 30 28   GLUCOSE  --   --  267*  --  197* 182* 188* 231* 180* 208*  BUN  --   --  19  --  22 25* 30* 38* 38* 47*  CREATININE  --   --  0.73  --  0.73 0.72 0.72 0.86 0.78 1.02*  CALCIUM  --   --  8.4*  --  8.7* 8.9 8.9 9.3 9.2 9.2  MG 2.0  --  1.9 1.9 1.8 1.8 2.1  --  2.4 2.3  PHOS 1.9*  --  2.5 2.6  --  3.8  --   --  3.3  --    < > = values in this interval not displayed.   GFR: Estimated Creatinine Clearance: 56.2 mL/min (A) (by C-G formula based on SCr of 1.02 mg/dL (H)). Recent Labs  Lab 01/02/18 0816  01/04/18 0349  01/06/18 0347 01/07/18 0558 01/08/18 0505 01/09/18 0434  PROCALCITON <0.10  --  <0.10  --   --   --   --   --   WBC  --    < > 11.5*   < > 11.8* 13.4* 12.7* 20.8*   < > = values in this interval not displayed.   Liver Function Tests: Recent Labs  Lab  01/05/18 0404 01/08/18 0505 01/09/18 0434  AST  --  45* 55*  ALT  --  26 31  ALKPHOS  --  37* 40  BILITOT  --  0.9 1.1  PROT  --  6.5 6.8  ALBUMIN 2.7* 2.6* 2.5*   No results for input(s): LIPASE, AMYLASE in the last 168 hours. No results for input(s): AMMONIA in the last 168 hours. ABG    Component Value Date/Time   PHART 7.457 (H) 01/09/2018 0607   PCO2ART 39.9 01/09/2018 0607   PO2ART 84.3 01/09/2018 0607   HCO3 27.8 01/09/2018 0607   TCO2 26 01/04/2018 0426   ACIDBASEDEF 5.0 (H) 01/01/2018 0333   O2SAT 96.9 01/09/2018 0607    Coagulation Profile: Recent Labs  Lab 01/09/18 0434  INR 1.32   Cardiac Enzymes: Recent Labs  Lab 01/03/18 0500  TROPONINI 0.03*   HbA1C: Hgb A1c MFr Bld  Date/Time Value Ref Range Status  12/30/2017 10:04 AM 8.4 (H) <5.7 % of total Hgb Final    Comment:    For someone without known diabetes, a hemoglobin A1c value of 6.5% or greater indicates that they may have  diabetes and this should be confirmed with a follow-up  test. . For someone with known diabetes, a value <7% indicates  that their diabetes is well controlled and a value  greater than or equal to 7% indicates suboptimal  control. A1c targets should be individualized based on  duration of diabetes, age, comorbid conditions, and  other considerations. . Currently, no consensus exists regarding use of hemoglobin A1c for diagnosis of diabetes for children. Marland Kitchen   08/09/2017 09:03 AM 9.2 (H) <5.7 % of total Hgb Final    Comment:    For someone without known diabetes, a hemoglobin A1c value of 6.5% or greater indicates that they may have  diabetes and this should be confirmed with a follow-up  test. . For someone with known diabetes, a value <7% indicates  that their diabetes is well controlled and a value  greater than or equal to 7% indicates  suboptimal  control. A1c targets should be individualized based on  duration of diabetes, age, comorbid conditions, and  other  considerations. . Currently, no consensus exists regarding use of hemoglobin A1c for diagnosis of diabetes for children. .    CBG: Recent Labs  Lab 01/08/18 1506 01/08/18 1911 01/09/18 0005 01/09/18 0330 01/09/18 0723  GLUCAP 203* 193* 203* 170* 173*    Melodie Bouillon MD  [facility]  [phone]  01/09/2018 7:46 AM

## 2018-01-09 NOTE — Progress Notes (Signed)
NEUROHOSPITALISTS STROKE TEAM - DAILY PROGRESS NOTE   SUBJECTIVE Patient `s family is not at bedside.  Patient wasrestless last night requiring fentanyl for sedation.Son has met with palliative care nurse practitioner and plans on one with extubation tomorrow OBJECTIVE Most recent Vital Signs: Vitals:   01/09/18 0645 01/09/18 0700 01/09/18 0801 01/09/18 0826  BP: (!) 119/46 (!) 107/56 (!) 92/40   Pulse: 70 70 70 70  Resp: (!) 22 (!) 33 (!) 35 (!) 26  Temp:   (!) 103 F (39.4 C)   TempSrc:   Axillary   SpO2: 96% 97% 95% 95%  Weight:      Height:       CBG (last 3)  Recent Labs    01/09/18 0005 01/09/18 0330 01/09/18 0723  GLUCAP 203* 170* 173*    Physical Exam  HEENT-intubated.   Cardiovascular- irregularly irregular heart rhythm Lungs-vented breath sounds.  Saturations within normal limits Abdomen-obese Musculoskeletal: Pulses weak in right lower extremities, only audible with Doppler and +1 in left lower extremity Skin-warm and dry.  Left groin dressing clean dry and intact no hematoma noted  Neuro:  Intubated off sedation. Eyes closed but barely open on voice or pain.  Lethargic, barely follows limited simple commands.  PERRL, fixed middle position, not lateral gaze. Corneal weakly positive b/l. Weak gag and cough. Not blinking to visual threat. Facial symmetry not able to test due to ET tube. LUE mild withdraw to pain and flaccid on the RUE. BLE mild withdraw to pain but able to wiggle toes on the right. DTR diminished and bilateral babinski. Sensation, coordination and gait not tested.  Lipid Panel    Component Value Date/Time   CHOL 135 01/01/2018 0323   TRIG 206 (H) 01/01/2018 0323   HDL 21 (L) 01/01/2018 0323   CHOLHDL 6.4 01/01/2018 0323   VLDL 41 (H) 01/01/2018 0323   LDLCALC 73 01/01/2018 0323   LDLCALC 118 (H) 08/09/2017 0903   HgbA1C  Lab Results  Component Value Date   HGBA1C 8.4 (H) 12/30/2017     Urine Drug Screen:      Component Value Date/Time   LABOPIA NONE DETECTED 01/07/2018 1747   COCAINSCRNUR NONE DETECTED 12/24/2017 1747   LABBENZ NONE DETECTED 12/12/2017 1747   AMPHETMU NONE DETECTED 01/02/2018 1747   THCU NONE DETECTED 12/15/2017 1747   LABBARB NONE DETECTED 01/05/2018 1747    Alcohol Level:  No results for input(s): ETH in the last 168 hours.  Ct Angio Head/ Neck W Or Wo Contrast/ CT Perfusion  12/29/2017 IMPRESSION:  1. 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 Superior cerebellar arteries not visualized. PCAs are perfused at this time via collateralization from small posterior communicating arteries.  2. No acute core infarct identified by CT perfusion.  3. Approximate 50% stenosis at the origin of the right vertebral artery. Right vertebral artery dominant and otherwise widely patent to the vertebrobasilar junction.  4. Poor opacification of the distal left V2 segment, which may be occluded. Reconstitution at the V3 segment, which remains patent to the cranial vault. Multifocal moderate V4 stenoses as above.  5. Atheromatous stenoses of up to approximately 70-80% about the carotid bifurcations  bilaterally. Evaluation limited by prominent calcifications and motion artifact on this exam.  6. 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  7. 60% stenosis at the origin of the left subclavian artery.  8. Patchy multifocal consolidative opacities within the visualized lungs, concerning for pneumonia. Evidence for underlying pulmonary interstitial edema.   Dg Chest 1 View 01/07/2018 IMPRESSION:  Tube positions as described without pneumothorax. Pulmonary vascular congestion. Mild perihilar pulmonary edema. No frank consolidation. Left base atelectasis noted. Postoperative changes noted.    Ct Head Wo Contrast 12/12/2017 IMPRESSION:  1. Involving nonhemorrhagic infarct involving the left  occipital lobe is described. This is consistent with acute left PCA occlusion.  2. No other acute or focal cortical or basal ganglia infarct evident.  3. Moderate diffuse white matter disease.  4. Anterior ethmoid and frontal sinus disease may be related to intubation.  EKG :A. fib /a flutter  Ct Head Wo Contrast 01/01/2018 IMPRESSION:  Subacute infarct involving left occipital lobe, left SCA distribution of cerebellum, and right thalamus. Mild increased edema and local mass effect. No herniation or associated hemorrhage.    Dg Chest Port 1 View 01/01/2018 IMPRESSION:  Endotracheal tube terminates 3.5 cm above the carina. Suspected mild perihilar edema, right lower lobe predominant. Less likely, multifocal right lung infection is possible. Postoperative changes with additional support apparatus as above.   Ct Head Wo Contrast 01/04/2018 IMPRESSION:  1. Evolving acute to subacute LEFT greater than RIGHT cerebellar, RIGHT thalamus and LEFT occipital posterior circulation infarcts. No hemorrhagic conversion. No new infarct.  2. Mild mass effect on fourth ventricle, no obstructive hydrocephalus.       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   CTA head & neck : Positive CTA for evolving LVO with occlusive thrombus within the distal basilar artery. Superior cerebellar arteries not visualized. Approximate 50% stenosis at the origin of the right vertebral artery,  Atheromatous stenoses of up to approximately 70-80% about the carotid bifurcations bilaterally. 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   MRI of the brain : not performed due to pacemaker  Repeat Head CT 9/20: Evolving Left PCA nonhemorrhagic infarct.   Repeat CT head 9/21 - left PCA, left SCA  and right thalamic infarcts  Repeat CT 9/24 - bilateral cerebellum, right thalamus, left occipital infarcts, no hydrocephalus.  2D Echocardiogram EF 35 to 40%  LDL 118  HgbA1c 8.4  VTE: heparin subq  Antiplatelets : Aspirin 81 mg daily/Brilinta 90 mg twice daily due to stenting  Continue Rehab with PT consult, OT consult, Speech consult  Therapy recommendations:   Pending  Disposition:   Pending  Dr. Halford Chessman discussed with son 01/07/18 and he is leaning to one way extubation if no improvement for the next 2-3 days. However, at meantime, will continue current management.   Atrial Fibrillation  Home anticoagulation:   Eliquis  Home medications, Betapace   CHA2DS2-VASc Score = 6             Age in Years:  ?50                         Sex:  Female   Female                      Hypertension History:  yes  Diabetes Mellitus:  0             Congestive Heart Failure History:  0             Vascular Disease History:  yes                             Stroke/TIA/Thromboembolism History:  yes     Meds: Coreg  Decision on resuming AC will based on treatment plan and family discussion.    Respiratory failure  Intubated off sedation  CCM on board  Tolerated weaning trial so far  However, mental status and brainstem reflexes not enough for extubation  May need trach vs. One way extubation vs. Comfort care  Hypertension urgency  Stable off cleviprex  BP goal < 160 post procedure  Home medications: Enalapril, Coreg  Hyperlipidemia  Home meds:   None  LDL 118, goal < 70  on lipitor 86m daily  Continue statin at discharge   Uncontrolled diabetes mellitus  A1C 8.4, goal < 7.0  SSI  CBG monitoring  Hyperglycemia much improved, glucose 170-190  Continue lantus 20U bid, and 5units NovoLog every 4 hours for tube feeding coverage  DM coordinator recs appreciated  Dietitian is to consider change TF formula to glucerna  CHF/CAD s/p  CABG  Currently on aspirin and Brilinta post cardiac stent  Continue coreg and home lasix  CCM on board  Low grade fever  Could be reason for her lethargy  Tmax 100.5->100.4-> 100.8  UA neg  CXR no frank penumonia  Stable leukocytosis 11.5->11.9->11.8->13.4  blood culture pending - No growth 3 days   CCM on board  Dysphagia  On tube feeding  CBG every 4 hours  NovoLog coverage  Other Stroke Risk Factors  Advanced age  Former smoker  PVD   Other Active Problems  GERD-continue PPI  Hypothyroidism-continue Synthroid  Palliative Care consult yesterday included discussion with family - possible extubation tomorrow.  Appreciate Palliative Care's assistance.   Hospital day # 9  I had a long discussion with the patient's son and sister at the bedside and discussed her prognosis and explained that she likely may need a tracheostomy and enteric tube and prolonged stay in rehabilitation and nursing home. The son appears to be unsure whether she would want it. I offered palliative care consult to help him decide hopefully over the next few days. He was in agreement with the plan. Discussed with Dr. IRosana Berger This patient is critically ill due to posterior infarcts, dysphagia, respiratory failure, BA occlusion s/p IR and at significant risk of neurological worsening, death form recurrent strokes, hemorrhagic conversion, cerebral edema, dysphagia, aspiration, seizure. This patient's care requires constant monitoring of vital signs, hemodynamics, respiratory and cardiac monitoring, review of multiple databases, neurological assessment, discussion with family, other specialists and medical decision making of high complexity. I spent 30 minutes of neurocritical care time in the care of this patient. I had long discussion with daughter and son at bedside, updated pt current condition, treatment plan and potential prognosis. They expressed understanding and appreciation.   PAntony Contras MD Medical Director [facility] Pager: [phone]/29/2019 11:52 AM    To contact Stroke Continuity provider, please refer to [email or web address] After hours, contact General Neurology

## 2018-01-09 NOTE — Consult Note (Signed)
Palliative Medicine    Name: [name] [name] Date: 01/09/2018 MRN: [id number]  DOB: 12/23/1945  Patient Care Team: Mikey Kirschner, MD as PCP - General (Family Medicine) Satira Sark, MD as PCP - Cardiology (Cardiology) Danie Binder, MD (Gastroenterology) Satira Sark, MD (Cardiology) Evans Lance, MD as Consulting Physician (Cardiology) Cassandria Anger, MD as Consulting Physician (Endocrinology) Danie Binder, MD as Consulting Physician (Gastroenterology)    REASON FOR CONSULTATION: Palliative Care consult requested for this 72 y.o. female for goals of medical treatment in patient with multiple medical problems including CAD, PVD, ICM with an EF of 25% improved to 45% following biventricular pacing, history of CHF, and A. fib on Eliquis, who was admitted December 31, 2017 with dysarthria and weakness.  Head CT revealed acute cerebral infarction of the right MCA.  She was outside the window for TPA.  However, on 9/20 she underwent revascularization of the occluded mid to distal basilar artery.  She was intubated for this procedure.  Repeat head CTs on 9/20, 9/21, 9/24 revealed evolving infarcts of the left PCA, left SCA, right thalamus, bilateral cerebellum, and left occipital area.  Patient has failed several attempts to wean from the vent.  CCM and neurology have met with patient's family, who have decided to pursue a one-way extubation if no clinical improvement in the next 1 to 2 days.  Palliative care was consulted to assist with establishing medical goals.   SOCIAL HISTORY: Patient lives at home with son. She also has an adopted daughter.  She is divorced.   ADVANCE DIRECTIVES:  Unknown  CODE STATUS: DNR  PAST MEDICAL HISTORY: Past Medical History:  Diagnosis Date  . Atrial fibrillation ([place])   . Chronic systolic heart failure ([place])   . Colon polyps    20 yrs ago  . Coronary atherosclerosis of native coronary artery    Multivessel, patent  grafts 2007  . Diabetes mellitus, type 2 ([place])   . Essential hypertension, benign   . Hyperlipidemia   . Hypothyroidism   . Hypothyroidism   . Ischemic cardiomyopathy    Ejection Fraction of 25% improved to 45-50% following biventicular pacing, Status post biventricular dual-chamber ICD - [employer]  . Myocardial infarction ([place])   . PVD (peripheral vascular disease) ([place])   . Status cardiac pacemaker   . Ulcer     PAST SURGICAL HISTORY:  Past Surgical History:  Procedure Laterality Date  . ABDOMINAL AORTAGRAM N/A 08/04/2013   Procedure: ABDOMINAL Maxcine Ham;  Surgeon: Elam Dutch, MD;  Location[facility] C[facility] LAB;  Service: Cardiovascular;  Laterality: N/A;  . APPENDECTOMY    . BI-VENTRICULAR PACEMAKER INSERTION N/A 11/20/2013   Procedure: BI-VENTRICULAR PACEMAKER INSERTION (CRT-P);  Surgeon: Evans Lance, MD;  Location: [facility] CATH LAB;  Service: Cardiovascular;  Laterality: N/A;  . Bilateral tubal ligation    . CARDIAC DEFIBRILLATOR PLACEMENT     [employer]  . CESAREAN SECTION    . CORONARY ARTERY BYPASS GRAFT  2001   [facility] - LIMA to LAD, SVG to ramus, SVG to OM/PLB, SVG to RCA   . FLEXIBLE SIGMOIDOSCOPY  07/19/1998   Dr. Laural Golden  . IR INTRA CRAN STENT  12/30/2017  . IR PERCUTANEOUS ART THROMBECTOMY/INFUSION INTRACRANIAL INC DIAG ANGIO  01/07/2018  . IR US GUIDE VASC ACCESS RIGHT  12/13/2017  . RADIOLOGY WITH ANESTHESIA N/A 12/20/2017   Procedure: RADIOLOGY WITH ANESTHESIA;  Surgeon: Luanne Bras, MD;  Location: [place];  Service:  Radiology;  Laterality: N/A;  . TONSILLECTOMY      HEMATOLOGY/ONCOLOGY HISTORY:   No history exists.    ALLERGIES:  is allergic to rosiglitazone; tapentadol; tape; and celecoxib.  MEDICATIONS:  Current Facility-Administered Medications  Medication Dose Route Frequency Provider Last Rate Last Dose  . 0.9 %  sodium chloride infusion   Intravenous PRN Rosalin Hawking, MD 10 mL/hr at 01/08/18 1900    . acetaminophen  (TYLENOL) tablet 650 mg  650 mg Oral Q4H PRN Greta Doom, MD       Or  . acetaminophen (TYLENOL) solution 650 mg  650 mg Per Tube Q4H PRN Greta Doom, MD   650 mg at 01/09/18 0744   Or  . acetaminophen (TYLENOL) suppository 650 mg  650 mg Rectal Q4H PRN Greta Doom, MD      . albuterol (PROVENTIL) (2.5 MG/3ML) 0.083% nebulizer solution 2.5 mg  2.5 mg Nebulization Q6H PRN Magdalen Spatz, NP      . aspirin chewable tablet 81 mg  81 mg Per Tube Daily Luanne Bras, MD   81 mg at 01/08/18 0941  . atorvastatin (LIPITOR) tablet 80 mg  80 mg Oral q1800 Nyanor, Pearl O, NP   80 mg at 01/08/18 1817  . bisacodyl (DULCOLAX) suppository 10 mg  10 mg Rectal Daily PRN Jennelle Human B, NP      . carvedilol (COREG) tablet 25 mg  25 mg Oral BID WC Magdalen Spatz, NP   25 mg at 01/09/18 0744  . chlorhexidine gluconate (MEDLINE KIT) (PERIDEX) 0.12 % solution 15 mL  15 mL Mouth Rinse BID Garvin Fila, MD   15 mL at 01/09/18 0748  . Chlorhexidine Gluconate Cloth 2 % PADS 6 each  6 each Topical Daily Rosalin Hawking, MD   6 each at 01/09/18 0100  . ciprofloxacin (CILOXAN) 0.3 % ophthalmic solution 2 drop  2 drop Right Eye Q6H Etheleen Nicks, MD   2 drop at 01/09/18 [phone]  . docusate (COLACE) 50 MG/5ML liquid 50 mg  50 mg Oral BID PRN Jennelle Human B, NP      . enalapril (VASOTEC) tablet 10 mg  10 mg Oral Daily Magdalen Spatz, NP   10 mg at 01/08/18 0941  . feeding supplement (PRO-STAT SUGAR FREE 64) liquid 30 mL  30 mL Per Tube BID Rosalin Hawking, MD   30 mL at 01/08/18 2151  . feeding supplement (VITAL HIGH PROTEIN) liquid 1,000 mL  1,000 mL Per Tube Q24H Rosalin Hawking, MD   1,000 mL at 01/09/18 0019  . fentaNYL (SUBLIMAZE) bolus via infusion 25-100 mcg  25-100 mcg Intravenous Q2H PRN Rosalin Hawking, MD      . fentaNYL 2579mg in NS 2546m(1036mml) infusion-PREMIX  0-400 mcg/hr Intravenous Continuous IsmEtheleen NicksD 7.5 mL/hr at 01/08/18 2001 75 mcg/hr at 01/08/18 2001  . free water  200 mL  200 mL Per Tube Q8H HofCorey HaroldP   200 mL at 01/09/18 0600  . furosemide (LASIX) tablet 40 mg  40 mg Per Tube Daily SimJennelle Human NP   40 mg at 01/08/18 0940  . heparin injection 5,000 Units  5,000 Units Subcutaneous Q8H GroMagdalen SpatzP   5,000 Units at 01/09/18 [id number] insulin aspart (novoLOG) injection 0-20 Units  0-20 Units Subcutaneous Q4H GroMagdalen SpatzP   4 Units at 01/09/18 0802  . insulin aspart (novoLOG) injection 5 Units  5 Units Subcutaneous Q4H Xu,Rosalin HawkingD  5 Units at 01/09/18 0802  . insulin glargine (LANTUS) injection 20 Units  20 Units Subcutaneous BID Rosalin Hawking, MD   20 Units at 01/08/18 2159  . labetalol (NORMODYNE,TRANDATE) injection 5 mg  5 mg Intravenous Q2H PRN Magdalen Spatz, NP   5 mg at 01/04/18 4287  . levothyroxine (SYNTHROID, LEVOTHROID) tablet 125 mcg  125 mcg Oral QAC breakfast Greta Doom, MD   125 mcg at 01/09/18 0744  . MEDLINE mouth rinse  15 mL Mouth Rinse 10 times per day Garvin Fila, MD   15 mL at 01/09/18 0513  . pantoprazole sodium (PROTONIX) 40 mg/20 mL oral suspension 40 mg  40 mg Per Tube Daily Rosalin Hawking, MD   40 mg at 01/08/18 0943  . potassium chloride 20 MEQ/15ML (10%) solution 40 mEq  40 mEq Per Tube BID Corey Harold, NP   40 mEq at 01/08/18 2151  . sodium chloride flush (NS) 0.9 % injection 10-40 mL  10-40 mL Intracatheter Q12H Rosalin Hawking, MD   10 mL at 01/08/18 2202  . sodium chloride flush (NS) 0.9 % injection 10-40 mL  10-40 mL Intracatheter PRN Rosalin Hawking, MD      . ticagrelor [facility]) tablet 90 mg  90 mg Oral BID Luanne Bras, MD       Or  . ticagrelor (BRILINTA) tablet 90 mg  90 mg Per Tube BID Luanne Bras, MD   90 mg at 01/08/18 2150    VITAL SIGNS: BP (!) 92/40 (BP Location: Left Arm)   Pulse 70   Temp (!) 103 F (39.4 C) (Axillary)   Resp (!) 35   Ht '5\' 1"'  (1.549 m)   Wt 101.7 kg   LMP  (LMP Unknown)   SpO2 95%   BMI 42.36 kg/m  Filed Weights   01/07/18 0500  01/08/18 0500 01/09/18 0400  Weight: 105.9 kg 106.2 kg 101.7 kg    Estimated body mass index is 42.36 kg/m as calculated from the following:   Height as of this encounter: '5\' 1"'  (1.549 m).   Weight as of this encounter: 101.7 kg.  LABS: CBC:    Component Value Date/Time   WBC 20.8 (H) 01/09/2018 0434   HGB 10.7 (L) 01/09/2018 0434   HCT 36.8 01/09/2018 0434   PLT 339 01/09/2018 0434   MCV 101.1 (H) 01/09/2018 0434   NEUTROABS 16.8 (H) 01/09/2018 0434   LYMPHSABS 1.5 01/09/2018 0434   MONOABS 2.1 (H) 01/09/2018 0434   EOSABS 0.2 01/09/2018 0434   BASOSABS 0.2 (H) 01/09/2018 0434   Comprehensive Metabolic Panel:    Component Value Date/Time   NA 150 (H) 01/09/2018 0434   K 4.3 01/09/2018 0434   CL 114 (H) 01/09/2018 0434   CO2 28 01/09/2018 0434   BUN 47 (H) 01/09/2018 0434   CREATININE 1.02 (H) 01/09/2018 0434   CREATININE 0.88 12/30/2017 1004   GLUCOSE 208 (H) 01/09/2018 0434   CALCIUM 9.2 01/09/2018 0434   AST 55 (H) 01/09/2018 0434   ALT 31 01/09/2018 0434   ALKPHOS 40 01/09/2018 0434   BILITOT 1.1 01/09/2018 0434   PROT 6.8 01/09/2018 0434   ALBUMIN 2.5 (L) 01/09/2018 0434    RADIOGRAPHIC STUDIES: Ct Angio Head W Or Wo Contrast  Result Date: 01/03/2018 CLINICAL DATA:  Initial evaluation for right-sided neglect, slurred speech. EXAM: CT ANGIOGRAPHY HEAD AND NECK CT PERFUSION BRAIN TECHNIQUE: Multidetector CT imaging of the head and neck was performed using the standard protocol during bolus administration of  intravenous contrast. Multiplanar CT image reconstructions and MIPs were obtained to evaluate the vascular anatomy. Carotid stenosis measurements (when applicable) are obtained utilizing NASCET criteria, using the distal internal carotid diameter as the denominator. Multiphase CT imaging of the brain was performed following IV bolus contrast injection. Subsequent parametric perfusion maps were calculated using RAPID software. CONTRAST:  160m ISOVUE-370 IOPAMIDOL  (ISOVUE-370) INJECTION 76% COMPARISON:  None available at time of this dictation. FINDINGS: CTA NECK FINDINGS Aortic arch: Examination technically limited by motion artifact. Visualized aortic arch of normal caliber with normal branch pattern. Moderate atherosclerotic change about the aortic arch and origin of the great vessels. Approximate 60% stenosis at the origin of the left subclavian artery. No other flow-limiting stenosis about the origin of the great vessels. Visualized subclavian arteries otherwise grossly patent. Right carotid system: Focal plaque at the origin of the right common carotid artery with associated 60% stenosis. Right common carotid artery otherwise patent to the bifurcation without flow-limiting stenosis. Extensive plaque about the right bifurcation/proximal right ICA. Resultant fairly severe stenosis of up to approximately 70-80%, although exact termination difficult given prominent calcifications and motion artifact within this region. Right ICA patent distally to the skull base. Left carotid system: Left common carotid artery patent from its origin to the bifurcation without stenosis. Prominent calcification about the left bifurcation/proximal left ICA with associated stenosis of up to approximately 75% by NASCET criteria, although again, evaluation limited. Left ICA patent distally to the skull base without stenosis, dissection or occlusion. Vertebral arteries: Both of the vertebral arteries arise from the subclavian arteries. Right vertebral artery dominant. Focal plaque at the origin of the right vertebral artery with suspected approximate 50% stenosis. Right vertebral artery otherwise patent to the skull base. Left vertebral artery somewhat diminutive. Left V1 and V2 segments patent proximally. There is poor opacification of the left V2 segment at the level of V3 (series 7, image 193), which may be occluded. Contrast opacification is seen distally at the left V3 segment, with the left  vert appearing patent as it courses into the cranial vault. Skeleton: No acute osseus abnormality. No discrete osseous lesions. Moderate cervical spondylolysis at C5-6 and C6-7. Other neck: No acute soft tissue abnormality within the neck. Upper chest: Left-sided pacemaker/AICD in place. Sequelae of prior CABG. Cardiomegaly partially visualized. Patchy multifocal consolidative opacities within the visualized lungs, concerning for pneumonia. Underlying pulmonary interstitial edema. Enlarged subcarinal nodal conglomerate measures 2.2 cm, which may be reactive. Review of the MIP images confirms the above findings CTA HEAD FINDINGS Anterior circulation: Petrous segments patent bilaterally. Extensive calcified plaque throughout the carotid siphons with moderate diffuse narrowing, up to approximately 50%. ICA termini patent. A1 segments patent bilaterally. Left A1 hypoplastic, likely accounting for the diminutive left ICA is compared to the right. Normal anterior communicating artery. Anterior cerebral arteries patent to their distal aspects without stenosis. Left M1 patent without stenosis. Normal left MCA bifurcation. No proximal left M2 occlusion. Distal left MCA branches well perfused. Right M1 widely patent. Normal right MCA bifurcation. No proximal right M2 occlusion. Distal right MCA branches well perfused. Posterior circulation: Scattered non stenotic plaque within the right V4 segment. Right vertebral patent to the vertebrobasilar junction without stenosis. Focal approximate 60% stenosis within the left V4 segment as it penetrates the dural reflection. Moderate multifocal stenoses seen distally within the left V4 segment. Posterior inferior cerebral arteries patent bilaterally. Basilar patent proximally. There is occlusion distally (series 7, image 112), which remains occluded to the basilar tip. Superior cerebellar arteries  not seen. There is perfusion of the PCAs bilateral, likely via small posterior  communicating arteries. Severe proximal right P2 stenosis (series 10, image 21). Multifocal atheromatous irregularity seen throughout the PCAs distally. Venous sinuses: Patent. Anatomic variants: Small bilateral posterior communicating arteries. Dominant right vertebral artery. Delayed phase: Not performed. Review of the MIP images confirms the above findings CT Brain Perfusion Findings: CBF (<30%) Volume: 59m Perfusion (Tmax>6.0s) volume: 7647mMismatch Volume: Infinite. Infarction Location:Negative infarct by CT perfusion. 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 IMPRESSION: 1. 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 Superior cerebellar arteries not visualized. PCAs are perfused at this time via collateralization from small posterior communicating arteries. 2. No acute core infarct identified by CT perfusion. 3. Approximate 50% stenosis at the origin of the right vertebral artery. Right vertebral artery dominant and otherwise widely patent to the vertebrobasilar junction. 4. Poor opacification of the distal left V2 segment, which may be occluded. Reconstitution at the V3 segment, which remains patent to the cranial vault. Multifocal moderate V4 stenoses as above. 5. Atheromatous stenoses of up to approximately 70-80% about the carotid bifurcations bilaterally. Evaluation limited by prominent calcifications and motion artifact on this exam. 6. 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 7. 60% stenosis at the origin of the left subclavian artery. 8. Patchy multifocal consolidative opacities within the visualized lungs, concerning for pneumonia. Evidence for underlying pulmonary interstitial edema. Critical Value/emergent results were called by telephone at the time of interpretation on 01/01/2018 at 5:26 am to Dr. CHJoseph Berkshire who verbally  acknowledged these results. Electronically Signed   By: BeJeannine Boga.D.   On: 12/25/2017 05:47   Dg Chest 1 View  Result Date: 01/01/2018 CLINICAL DATA:  CVA.  Hypoxia. EXAM: CHEST  1 VIEW COMPARISON:  June 22, 2016 FINDINGS: Endotracheal tube tip is 2.8 cm above the carina. Nasogastric tube tip and side port are below the diaphragm. Pacemaker leads are attached the right atrium, right ventricle, and coronary sinus. No pneumothorax. : There is atelectatic change in the left base. There is hazy opacity in the perihilar regions, likely representing localized edema. Lungs elsewhere clear. There is cardiomegaly with pulmonary venous hypertension. Patient is status post coronary artery bypass grafting. No adenopathy. No bone lesions. IMPRESSION: Tube positions as described without pneumothorax. Pulmonary vascular congestion. Mild perihilar pulmonary edema. No frank consolidation. Left base atelectasis noted. Postoperative changes noted. Electronically Signed   By: WiLowella GripII M.D.   On: 12/16/2017 12:16   Ct Head Wo Contrast  Result Date: 01/04/2018 CLINICAL DATA:  Sluggish asymmetric pupils.  History of stroke. EXAM: CT HEAD WITHOUT CONTRAST TECHNIQUE: Contiguous axial images were obtained from the base of the skull through the vertex without intravenous contrast. COMPARISON:  CT HEAD January 01, 2018 FINDINGS: BRAIN: Evolving acute LEFT superior cerebellar infarct and LEFT subacute mesial occipital lobe infarct. Evolving subacute RIGHT small cerebellar infarcts. Evolving RIGHT mesial thalamus infarct with new petechial hemorrhage. Associated regional mass effect without midline shift. No intraparenchymal hemorrhage. Moderate parenchymal brain volume loss. No hydrocephalus. Patchy supratentorial white matter hypodensities compatible with mild chronic small vessel ischemic changes. VASCULAR: Severe calcific atherosclerosis of the carotid siphons. Status post basilar artery to RIGHT PCA  pipeline stent. SKULL: No skull fracture. No significant scalp soft tissue swelling. SINUSES/ORBITS: Trace paranasal sinus mucosal thickening. Mastoid air cells are well aerated.The included ocular globes and orbital contents are non-suspicious. Status post bilateral ocular  lens implants. OTHER: Life support lines in place. IMPRESSION: 1. Evolving acute to subacute LEFT greater than RIGHT cerebellar, RIGHT thalamus and LEFT occipital posterior circulation infarcts. No hemorrhagic conversion. No new infarct. 2. Mild mass effect on fourth ventricle, no obstructive hydrocephalus. Electronically Signed   By: Elon Alas M.D.   On: 01/04/2018 18:02   Ct Head Wo Contrast  Result Date: 01/01/2018 CLINICAL DATA:  72 y/o  F; stroke for follow-up. EXAM: CT HEAD WITHOUT CONTRAST TECHNIQUE: Contiguous axial images were obtained from the base of the skull through the vertex without intravenous contrast. COMPARISON:  12/16/2017 CT head, CTA head, CT perfusion head. FINDINGS: Brain: Subacute infarct involving the left occipital lobe, left SCA distribution of cerebellum, and right thalamus with mild edema and local mass effect. No associated hemorrhage. No additional focus of stroke, hemorrhage, or focal mass effect identified. No extra-axial collection, hydrocephalus, or effacement of basilar cisterns. Stable background of chronic microvascular ischemic changes and volume loss of the brain. Vascular: Tip of basilar to right PCA stent in situ. Skull: Normal. Negative for fracture or focal lesion. Sinuses/Orbits: No acute finding. Other: Bilateral intra-ocular lens replacement. IMPRESSION: Subacute infarct involving left occipital lobe, left SCA distribution of cerebellum, and right thalamus. Mild increased edema and local mass effect. No herniation or associated hemorrhage. Electronically Signed   By: Kristine Garbe M.D.   On: 01/01/2018 02:36   Ct Head Wo Contrast  Result Date: 12/14/2017 CLINICAL DATA:   Status post revascularization of distal basilar occlusion with rescue stent placed the mid basilar artery into the right posterior cerebral artery. The left posterior cerebral artery remains occluded. EXAM: CT HEAD WITHOUT CONTRAST TECHNIQUE: Contiguous axial images were obtained from the base of the skull through the vertex without intravenous contrast. COMPARISON:  CT perfusion and CT angiogram of the same day. FINDINGS: Brain: There is loss of gray-white differentiation at the left occipital pole and along the medial left occipital lobe. Inferior left occipital lobe infarct is noted. No associated hemorrhage is present. Although there is white matter disease on the right, no definite cortical infarct is present in the right occipital lobe. Basal ganglia are intact. Insular ribbon is normal bilaterally. Basilar artery stent extending into the right PCA is noted. There is no hyperdense vessel in the posterior circulation. Ventricles are proportionate to the degree of atrophy. No significant extra-axial fluid collection is present. A cavum septum pellucidum is incidentally noted. Vascular: Basilar artery stent in place. Atherosclerotic calcifications are present in the cavernous internal carotid arteries and at the dural margin of both vertebral arteries without significant stenosis. Skull: The skull base is within normal limits. No focal lytic or blastic lesions are present. Sinuses/Orbits: Mucosal thickening is present in the anterior ethmoid air cells with minimal fluid in the frontal sinuses bilaterally. The remaining paranasal sinuses are clear. The mastoid air cells are clear. The patient is intubated. Bilateral lens replacements are present. Globes and orbits are otherwise within normal limits. IMPRESSION: 1. Involving nonhemorrhagic infarct involving the left occipital lobe is described. This is consistent with acute left PCA occlusion. 2. No other acute or focal cortical or basal ganglia infarct evident. 3.  Moderate diffuse white matter disease. 4. Anterior ethmoid and frontal sinus disease may be related to intubation. Electronically Signed   By: San Morelle M.D.   On: 01/04/2018 12:22   Ct Angio Neck W And/or Wo Contrast  Result Date: 12/21/2017 CLINICAL DATA:  Initial evaluation for right-sided neglect, slurred speech. EXAM: CT ANGIOGRAPHY HEAD  AND NECK CT PERFUSION BRAIN TECHNIQUE: Multidetector CT imaging of the head and neck was performed using the standard protocol during bolus administration of intravenous contrast. Multiplanar CT image reconstructions and MIPs were obtained to evaluate the vascular anatomy. Carotid stenosis measurements (when applicable) are obtained utilizing NASCET criteria, using the distal internal carotid diameter as the denominator. Multiphase CT imaging of the brain was performed following IV bolus contrast injection. Subsequent parametric perfusion maps were calculated using RAPID software. CONTRAST:  127m ISOVUE-370 IOPAMIDOL (ISOVUE-370) INJECTION 76% COMPARISON:  None available at time of this dictation. FINDINGS: CTA NECK FINDINGS Aortic arch: Examination technically limited by motion artifact. Visualized aortic arch of normal caliber with normal branch pattern. Moderate atherosclerotic change about the aortic arch and origin of the great vessels. Approximate 60% stenosis at the origin of the left subclavian artery. No other flow-limiting stenosis about the origin of the great vessels. Visualized subclavian arteries otherwise grossly patent. Right carotid system: Focal plaque at the origin of the right common carotid artery with associated 60% stenosis. Right common carotid artery otherwise patent to the bifurcation without flow-limiting stenosis. Extensive plaque about the right bifurcation/proximal right ICA. Resultant fairly severe stenosis of up to approximately 70-80%, although exact termination difficult given prominent calcifications and motion artifact within  this region. Right ICA patent distally to the skull base. Left carotid system: Left common carotid artery patent from its origin to the bifurcation without stenosis. Prominent calcification about the left bifurcation/proximal left ICA with associated stenosis of up to approximately 75% by NASCET criteria, although again, evaluation limited. Left ICA patent distally to the skull base without stenosis, dissection or occlusion. Vertebral arteries: Both of the vertebral arteries arise from the subclavian arteries. Right vertebral artery dominant. Focal plaque at the origin of the right vertebral artery with suspected approximate 50% stenosis. Right vertebral artery otherwise patent to the skull base. Left vertebral artery somewhat diminutive. Left V1 and V2 segments patent proximally. There is poor opacification of the left V2 segment at the level of V3 (series 7, image 193), which may be occluded. Contrast opacification is seen distally at the left V3 segment, with the left vert appearing patent as it courses into the cranial vault. Skeleton: No acute osseus abnormality. No discrete osseous lesions. Moderate cervical spondylolysis at C5-6 and C6-7. Other neck: No acute soft tissue abnormality within the neck. Upper chest: Left-sided pacemaker/AICD in place. Sequelae of prior CABG. Cardiomegaly partially visualized. Patchy multifocal consolidative opacities within the visualized lungs, concerning for pneumonia. Underlying pulmonary interstitial edema. Enlarged subcarinal nodal conglomerate measures 2.2 cm, which may be reactive. Review of the MIP images confirms the above findings CTA HEAD FINDINGS Anterior circulation: Petrous segments patent bilaterally. Extensive calcified plaque throughout the carotid siphons with moderate diffuse narrowing, up to approximately 50%. ICA termini patent. A1 segments patent bilaterally. Left A1 hypoplastic, likely accounting for the diminutive left ICA is compared to the right. Normal  anterior communicating artery. Anterior cerebral arteries patent to their distal aspects without stenosis. Left M1 patent without stenosis. Normal left MCA bifurcation. No proximal left M2 occlusion. Distal left MCA branches well perfused. Right M1 widely patent. Normal right MCA bifurcation. No proximal right M2 occlusion. Distal right MCA branches well perfused. Posterior circulation: Scattered non stenotic plaque within the right V4 segment. Right vertebral patent to the vertebrobasilar junction without stenosis. Focal approximate 60% stenosis within the left V4 segment as it penetrates the dural reflection. Moderate multifocal stenoses seen distally within the left V4 segment. Posterior inferior cerebral  arteries patent bilaterally. Basilar patent proximally. There is occlusion distally (series 7, image 112), which remains occluded to the basilar tip. Superior cerebellar arteries not seen. There is perfusion of the PCAs bilateral, likely via small posterior communicating arteries. Severe proximal right P2 stenosis (series 10, image 21). Multifocal atheromatous irregularity seen throughout the PCAs distally. Venous sinuses: Patent. Anatomic variants: Small bilateral posterior communicating arteries. Dominant right vertebral artery. Delayed phase: Not performed. Review of the MIP images confirms the above findings CT Brain Perfusion Findings: CBF (<30%) Volume: 21m Perfusion (Tmax>6.0s) volume: 7622mMismatch Volume: Infinite. Infarction Location:Negative infarct by CT perfusion. 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 IMPRESSION: 1. 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 Superior cerebellar arteries not visualized. PCAs are perfused at this time via collateralization from small posterior communicating arteries. 2. No acute core infarct identified by CT perfusion. 3. Approximate 50% stenosis at  the origin of the right vertebral artery. Right vertebral artery dominant and otherwise widely patent to the vertebrobasilar junction. 4. Poor opacification of the distal left V2 segment, which may be occluded. Reconstitution at the V3 segment, which remains patent to the cranial vault. Multifocal moderate V4 stenoses as above. 5. Atheromatous stenoses of up to approximately 70-80% about the carotid bifurcations bilaterally. Evaluation limited by prominent calcifications and motion artifact on this exam. 6. 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 7. 60% stenosis at the origin of the left subclavian artery. 8. Patchy multifocal consolidative opacities within the visualized lungs, concerning for pneumonia. Evidence for underlying pulmonary interstitial edema. Critical Value/emergent results were called by telephone at the time of interpretation on 12/19/2017 at 5:26 am to Dr. CHJoseph Berkshire who verbally acknowledged these results. Electronically Signed   By: BeJeannine Boga.D.   On: 12/15/2017 05:47   Ir Intra Cran Stent  Result Date: 01/04/2018 INDICATION: Progressive neurologic decline with right-sided weakness. CT angiogram of the head and neck revealing occluded mid to distal basilar artery extending into the proximal posterior cerebral arteries. EXAM: 1. EMERGENT LARGE VESSEL OCCLUSION THROMBOLYSIS (POSTERIOR CIRCULATION) COMPARISON:  CT angiogram of the head and neck of 01/06/2018 MEDICATIONS: Ancef 2 g IV antibiotic was administered within 1 hour of the procedure ANESTHESIA/SEDATION: General anesthesia CONTRAST:  Isovue 300 approximately 150 cc FLUOROSCOPY TIME:  Fluoroscopy Time: 66 minutes 42 seconds 4856 mGy). COMPLICATIONS: None immediate. TECHNIQUE: Following a full explanation of the procedure along with the potential associated complications, an informed witnessed consent was obtained patient's daughters. The risks of intracranial hemorrhage  of 10%, worsening neurological deficit, ventilator dependency, death and inability to revascularize were all reviewed in detail with the patient's daughters. The patient was then [facility] utilized for diagnostic and also endovascular treatment in view of patient's absent distal pulses in both feet, and because of poorly visualized to absent common femoral pulses on ultrasound examination. Patient also has a documented history of severe peripheral vascular disease limiting her ambulation. The right forearm to the radial region was prepped and draped in the usual sterile fashion. A Biuret test revealed only partial depression of the waveforms from the pulse oximeter on the right thumb confirming patency of the dorsopalmar anastomosis of the right hand. Thereafter using ultrasound guidance transradial access into the right radial artery was obtained without difficulty. Over a 0.018 inch inch guidewire a 5 French radial access sheath  was inserted., free aspiration of blood was noted at the hub of the sheath. Patient was given a combination of a 1000 units of heparin, 2.5 mg of verapamil, and 200 mcg of nitroglycerin into the sheath. Thereafter, over a 0.035 inch Roadrunner guidewire, a 5 [place] Simmons 2 diagnostic catheter was advanced to the aortic arch region and positioned in the right subclavian artery, just distal to the origin of the right vertebral artery. FINDINGS: A control arteriogram performed at the site and centered extra cranially and intracranially demonstrated wide patency of the right vertebral artery at its origin with ascent of the vertebral arteries to the cranial skull base. Complete occlusion of the distal half of the basilar artery is noted distal to the origins of the anterior-inferior cerebellar artery. The right posterior-inferior cerebellar artery demonstrates wide patency. The right  common carotid artery injection demonstrates patency of the right common carotid artery to the right common carotid bifurcation. The right common carotid bifurcation demonstrates atherosclerotic disease involving the right carotid bifurcation with associated stenosis of approximately 50%. However, there is patency of the right external carotid artery and its branches. The right internal carotid artery distal to the bulb appears widely patent to the cranial skull base. Wide patency is seen of the petrous, cavernous and supraclinoid segments. A right posterior communicating artery is seen opacifying the right posterior cerebral artery distribution. The opacified portion of the middle cerebral artery and the anterior cerebral artery grossly demonstrates no abnormalities in the visualized portions. 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 Over a 0.035 inch 300 cm Rosen exchange guidewire, the [employer] 2 diagnostic catheter was exchanged for 115 cm 5 [place] Navien guide catheter using biplane roadmap technique and constant fluoroscopic guidance. The guidewire was removed. Good aspiration obtained from the hub of the Navien guide catheter. This was then retrieved gently to just proximal to the origin of the right vertebral artery. Thereafter over a 0.035 inch Roadrunner guidewire, using biplane roadmap technique and constant fluoroscopic guidance, easy access was obtained into the right vertebral artery to the C1-C2 junction followed by the Navien guide catheter. The guidewire was removed. Good aspiration obtained from the hub of the Navien guide catheter. A gentle control arteriogram performed at this site  demonstrated no evidence of spasms, dissections or of intraluminal filling defect defects. No change was noted in the occluded basilar artery. At this time, in a coaxial manner and with constant heparinized saline infusion, over a 0.014 inch Softip Synchro micro guidewire, a Trevo ProVue 021 microcatheter was advanced to the distal end of the Navien guide catheter. With the micro guidewire leading with a J-tip configuration, the combination was navigated without difficulty to just proximal to the occluded basilar artery. Thereafter using a torque device, access with the micro guidewire was obtained into the right superior cerebellar artery followed by the microcatheter. The guidewire was removed. There was good aspiration of blood from the hub of the tip of the microcatheter. A very gentle contrast injection revealed wide patency of the superior cerebellar artery with safe position of tip of the microcatheter. At this time, in a coaxial manner and with constant heparinized saline infusion using biplane roadmap technique and constant fluoroscopic guidance, an Embotrap 5 mm x 33 mm  retrieval device was advanced to the distal end of the microcatheter. The O ring on the delivery microcatheter was then loosened. With slight gentle forward traction with the right hand on the delivery micro guidewire of the retrieval device, the delivery microcatheter was retrieved unsheathing the distal and then the proximal retrieval device. A control arteriogram performed through the Navien guide catheter demonstrated no revascularization noted despite the retrieval device. At this time, with constant aspiration being applied at the hub of the Navien guide catheter with a 25 mL syringe, the combination of the retrieval device and microcatheter were retrieved and removed. A few specks of clot were noted in the interstices of the retrieval device. A gentle control arteriogram performed through the Navien guide catheter in the right  vertebrobasilar junction demonstrated no change in the occluded basilar artery. The 021 Trevo ProVue microcatheter was then again advanced under biplane roadmap technique to the proximal portion of the occluded basilar artery. Again using a torque device, attempts were made to access completely through the clot with the micro guidewire with only partial success. The guidewire was then removed. This was then replaced with a 0.016 inch Headliner curved micro guidewire. This was then advanced to the distal end of the microcatheter. Under biplane roadmap technique, using a torque device, access with this wire was obtained with gentle probing into the left posterior cerebral artery followed by the microcatheter. The guidewire was removed. Good aspiration obtained from the hub of the Trevo ProVue microcatheter which was now positioned in the P2 region of the left posterior cerebral artery. This was then connected to continuous heparinized saline infusion. The 5 mm x 33 mm Embotrap retrieval device was then advanced to the distal end of the microcatheter. Again the O ring on the delivery micro guidewire was loosened. With slight forward gentle traction with the right hand on the the delivery micro guidewire, the microcatheter was unsheathed retrieving the distal and the proximal portion of the retrieval device. A gentle control arteriogram performed through the Navien guide catheter demonstrated partial revascularization of the basilar artery and also no visibility of the right posterior cerebral artery. However, the left posterior cerebral artery remained occluded. At this time, the combination of the retrieval device and the microcatheter were gently retrieved as constant aspiration was applied with a [place] vacuum suctioning at the hub of the Navien guide catheter. Aspiration was continued as the retrieval device was removed. Chunks of clot were noted in the container of the vacuum suction device. Also noted were a  few specks of clot into in the interstices of the retrieval device. A control arteriogram performed through the Navien guide catheter in the right vertebrobasilar junction now demonstrated revascularized posterior cerebral arteries, superior cerebellar arteries and the anterior-inferior cerebellar arteries. There appeared to be severe stenosis noted in the right posterior cerebral artery P2 segment,. There was perfusion noted in the left posterior cerebral artery additionally. A control arteriogram performed approximately 3 minutes demonstrated progressive occlusion of the right posterior cerebral artery. Patency of the left posterior cerebral artery was maintained. It was, therefore, decided to perform a rescue stent to maintain patency of the occluding right posterior cerebral artery. Through the 8 [place] Navien guide catheter, an 021 Trevo ProVue microcatheter was advanced over a 0.014 inch Softip Synchro microguidewire to the proximal basilar artery. Using a torque device, access into the right posterior cerebral artery P2 P3 segment was obtained without difficulty. The guidewire was removed. Good aspiration obtained from the hub of the microcatheter.  This was then connected to continuous heparinized saline infusion. At this time, a 4 mm x 30 mm [employer] vascular reconstruct device was advanced to the distal end of the microcatheter using biplane roadmap technique and constant fluoroscopic guidance. Proximal and distal markers were then defined for position. The O ring on the delivery microcatheter was loosened. With slight forward gentle traction with the right hand on the delivery micro guidewire with the left hand, the delivery microcatheter was unsheathed deploying the stent in the right posterior cerebral artery and in the distal 1/3 of the basilar artery. A control arteriogram performed through the Navien guide catheter demonstrated near complete revascularization of the right posterior cerebral artery,  and the superior cerebellar arteries bilaterally. The anterior-inferior cerebellar arteries remain patent as well. There was now occlusion of the left posterior cerebral artery. Placement of a rescue stent at this site was also considered. However, after evaluating the CT angiogram of the patient prior to the procedure patency of the left posterior communicating artery was noted opacifying the left posterior cerebral artery distribution. Also noted was the angiographic patency of the left posterior cerebral artery earlier in the angiogram. A control arteriogram performed through the Navien guide catheter in right vertebrobasilar junction continued to demonstrate wide patency of the right posterior cerebellar, the superior cerebellar arteries and the anterior-inferior cerebellar arteries. The Navien guide catheter was removed without difficulty. Good aspiration obtained at the hub of the radial sheath in the right radial artery. A radial wrist band was then applied with the green marker just north of the skin entry site on the radial artery. The wrist band was then inflated with 18 cc of the air. This was then removed with no bleeding noted. The pressure from the wrist band was gently released until there was a palpable radial pulse. The wrist band was left at this pressure for hemostasis. A regional pulse appeared palpable. Good color was noted in the fingers of the right hand. Also noted was a mild focal prominence of the upper right forearm. A quick ultrasound at this site revealed a superficial hematoma measuring approximately 4 cm x 5 cm. There was no vascular compromise noted. The brachial and the right radial pulse were palpable without difficulty. A circular pressure band was then applied at this site. The patient was then transferred to the neuro ICU intubated. Throughout the procedure, the patient's blood pressure and neurological status remained stable. IMPRESSION: Status post endovascular complete  revascularization of occluded basilar artery and the right posterior cerebral artery with 2 passes with the Embotrap 5 mm x 33 mm retrieval device, and placement of rescue stent in the right posterior cerebral artery for reocclusion due to severe underlying atherosclerotic disease. Patency of the superior cerebellar arteries and the anterior-inferior cerebellar arteries restored. Left posterior cerebral artery supplied via the left posterior communicating artery from the anterior circulation. PLAN: Patient transferred to the neuro ICU for continued post stroke management. Follow-up in the clinic in approximately 4 weeks post discharge. Electronically Signed   By: Luanne Bras M.D.   On: 01/02/2018 11:12   Ir US Guide Vasc Access Right  Result Date: 01/04/2018 INDICATION: Progressive neurologic decline with right-sided weakness. CT angiogram of the head and neck revealing occluded mid to distal basilar artery extending into the proximal posterior cerebral arteries. EXAM: 1. EMERGENT LARGE VESSEL OCCLUSION THROMBOLYSIS (POSTERIOR CIRCULATION) COMPARISON:  CT angiogram of the head and neck of 01/02/2018 MEDICATIONS: Ancef 2 g IV antibiotic was administered within 1 hour of the  procedure ANESTHESIA/SEDATION: General anesthesia CONTRAST:  Isovue 300 approximately 150 cc FLUOROSCOPY TIME:  Fluoroscopy Time: 66 minutes 42 seconds 4856 mGy). COMPLICATIONS: None immediate. TECHNIQUE: Following a full explanation of the procedure along with the potential associated complications, an informed witnessed consent was obtained patient's daughters. The risks of intracranial hemorrhage of 10%, worsening neurological deficit, ventilator dependency, death and inability to revascularize were all reviewed in detail with the patient's daughters. The patient was then put under general anesthesia by the Department of Anesthesiology at [facility]. A right radial access was utilized for diagnostic and also endovascular  treatment in view of patient's absent distal pulses in both feet, and because of poorly visualized to absent common femoral pulses on ultrasound examination. Patient also has a documented history of severe peripheral vascular disease limiting her ambulation. The right forearm to the radial region was prepped and draped in the usual sterile fashion. A Biuret test revealed only partial depression of the waveforms from the pulse oximeter on the right thumb confirming patency of the dorsopalmar anastomosis of the right hand. Thereafter using ultrasound guidance transradial access into the right radial artery was obtained without difficulty. Over a 0.018 inch inch guidewire a 5 French radial access sheath was inserted., free aspiration of blood was noted at the hub of the sheath. Patient was given a combination of a 1000 units of heparin, 2.5 mg of verapamil, and 200 mcg of nitroglycerin into the sheath. Thereafter, over a 0.035 inch Roadrunner guidewire, a 5 [place] Simmons 2 diagnostic catheter was advanced to the aortic arch region and positioned in the right subclavian artery, just distal to the origin of the right vertebral artery. FINDINGS: A control arteriogram performed at the site and centered extra cranially and intracranially demonstrated wide patency of the right vertebral artery at its origin with ascent of the vertebral arteries to the cranial skull base. Complete occlusion of the distal half of the basilar artery is noted distal to the origins of the anterior-inferior cerebellar artery. The right posterior-inferior cerebellar artery demonstrates wide patency. The right common carotid artery injection demonstrates patency of the right common carotid artery to the right common carotid bifurcation. The right common carotid bifurcation demonstrates atherosclerotic disease involving the right carotid bifurcation with associated stenosis of approximately 50%. However, there is patency of the right external  carotid artery and its branches. The right internal carotid artery distal to the bulb appears widely patent to the cranial skull base. Wide patency is seen of the petrous, cavernous and supraclinoid segments. A right posterior communicating artery is seen opacifying the right posterior cerebral artery distribution. The opacified portion of the middle cerebral artery and the anterior cerebral artery grossly demonstrates no abnormalities in the visualized portions. 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 Over a 0.035 inch 300 cm Rosen exchange guidewire, the [employer] 2 diagnostic catheter was exchanged for 115 cm 5 [place] Navien guide catheter using biplane roadmap technique and constant fluoroscopic guidance. The guidewire was removed. Good aspiration obtained from the hub of the Navien guide catheter. This was then retrieved gently to just proximal to the origin of the right vertebral artery. Thereafter over a 0.035 inch Roadrunner guidewire, using biplane roadmap technique and constant fluoroscopic guidance, easy access was  obtained into the right vertebral artery to the C1-C2 junction followed by the Navien guide catheter. The guidewire was removed. Good aspiration obtained from the hub of the Navien guide catheter. A gentle control arteriogram performed at this site demonstrated no evidence of spasms, dissections or of intraluminal filling defect defects. No change was noted in the occluded basilar artery. At this time, in a coaxial manner and with constant heparinized saline infusion, over a 0.014 inch Softip Synchro micro guidewire, a Trevo ProVue 021 microcatheter was advanced to the distal end  of the Navien guide catheter. With the micro guidewire leading with a J-tip configuration, the combination was navigated without difficulty to just proximal to the occluded basilar artery. Thereafter using a torque device, access with the micro guidewire was obtained into the right superior cerebellar artery followed by the microcatheter. The guidewire was removed. There was good aspiration of blood from the hub of the tip of the microcatheter. A very gentle contrast injection revealed wide patency of the superior cerebellar artery with safe position of tip of the microcatheter. At this time, in a coaxial manner and with constant heparinized saline infusion using biplane roadmap technique and constant fluoroscopic guidance, an Embotrap 5 mm x 33 mm retrieval device was advanced to the distal end of the microcatheter. The O ring on the delivery microcatheter was then loosened. With slight gentle forward traction with the right hand on the delivery micro guidewire of the retrieval device, the delivery microcatheter was retrieved unsheathing the distal and then the proximal retrieval device. A control arteriogram performed through the Navien guide catheter demonstrated no revascularization noted despite the retrieval device. At this time, with constant aspiration being applied at the hub of the Navien guide catheter with a 25 mL syringe, the combination of the retrieval device and microcatheter were retrieved and removed. A few specks of clot were noted in the interstices of the retrieval device. A gentle control arteriogram performed through the Navien guide catheter in the right vertebrobasilar junction demonstrated no change in the occluded basilar artery. The 021 Trevo ProVue microcatheter was then again advanced under biplane roadmap technique to the proximal portion of the occluded basilar artery. Again using a torque device, attempts were made to access completely through the clot with the micro guidewire with  only partial success. The guidewire was then removed. This was then replaced with a 0.016 inch Headliner curved micro guidewire. This was then advanced to the distal end of the microcatheter. Under biplane roadmap technique, using a torque device, access with this wire was obtained with gentle probing into the left posterior cerebral artery followed by the microcatheter. The guidewire was removed. Good aspiration obtained from the hub of the Trevo ProVue microcatheter which was now positioned in the P2 region of the left posterior cerebral artery. This was then connected to continuous heparinized saline infusion. The 5 mm x 33 mm Embotrap retrieval device was then advanced to the distal end of the microcatheter. Again the O ring on the delivery micro guidewire was loosened. With slight forward gentle traction with the right hand on the the delivery micro guidewire, the microcatheter was unsheathed retrieving the distal and the proximal portion of the retrieval device. A gentle control arteriogram performed through the Navien guide catheter demonstrated partial revascularization of the basilar artery and also no visibility of the right posterior cerebral artery. However, the left posterior cerebral artery remained occluded. At this time, the combination of the retrieval device and the microcatheter were gently retrieved as  constant aspiration was applied with a Penumbra vacuum suctioning at the hub of the Navien guide catheter. Aspiration was continued as the retrieval device was removed. Chunks of clot were noted in the container of the vacuum suction device. Also noted were a few specks of clot into in the interstices of the retrieval device. A control arteriogram performed through the Navien guide catheter in the right vertebrobasilar junction now demonstrated revascularized posterior cerebral arteries, superior cerebellar arteries and the anterior-inferior cerebellar arteries. There appeared to be severe stenosis  noted in the right posterior cerebral artery P2 segment,. There was perfusion noted in the left posterior cerebral artery additionally. A control arteriogram performed approximately 3 minutes demonstrated progressive occlusion of the right posterior cerebral artery. Patency of the left posterior cerebral artery was maintained. It was, therefore, decided to perform a rescue stent to maintain patency of the occluding right posterior cerebral artery. Through the 8 [place] Navien guide catheter, an 021 Trevo ProVue microcatheter was advanced over a 0.014 inch Softip Synchro microguidewire to the proximal basilar artery. Using a torque device, access into the right posterior cerebral artery P2 P3 segment was obtained without difficulty. The guidewire was removed. Good aspiration obtained from the hub of the microcatheter. This was then connected to continuous heparinized saline infusion. At this time, a 4 mm x 30 mm Enterprise vascular reconstruct device was advanced to the distal end of the microcatheter using biplane roadmap technique and constant fluoroscopic guidance. Proximal and distal markers were then defined for position. The O ring on the delivery microcatheter was loosened. With slight forward gentle traction with the right hand on the delivery micro guidewire with the left hand, the delivery microcatheter was unsheathed deploying the stent in the right posterior cerebral artery and in the distal 1/3 of the basilar artery. A control arteriogram performed through the Navien guide catheter demonstrated near complete revascularization of the right posterior cerebral artery, and the superior cerebellar arteries bilaterally. The anterior-inferior cerebellar arteries remain patent as well. There was now occlusion of the left posterior cerebral artery. Placement of a rescue stent at this site was also considered. However, after evaluating the CT angiogram of the patient prior to the procedure patency of the left  posterior communicating artery was noted opacifying the left posterior cerebral artery distribution. Also noted was the angiographic patency of the left posterior cerebral artery earlier in the angiogram. A control arteriogram performed through the Navien guide catheter in right vertebrobasilar junction continued to demonstrate wide patency of the right posterior cerebellar, the superior cerebellar arteries and the anterior-inferior cerebellar arteries. The Navien guide catheter was removed without difficulty. Good aspiration obtained at the hub of the radial sheath in the right radial artery. A radial wrist band was then applied with the green marker just north of the skin entry site on the radial artery. The wrist band was then inflated with 18 cc of the air. This was then removed with no bleeding noted. The pressure from the wrist band was gently released until there was a palpable radial pulse. The wrist band was left at this pressure for hemostasis. A regional pulse appeared palpable. Good color was noted in the fingers of the right hand. Also noted was a mild focal prominence of the upper right forearm. A quick ultrasound at this site revealed a superficial hematoma measuring approximately 4 cm x 5 cm. There was no vascular compromise noted. The brachial and the right radial pulse were palpable without difficulty. A circular pressure band was then  applied at this site. The patient was then transferred to the neuro ICU intubated. Throughout the procedure, the patient's blood pressure and neurological status remained stable. IMPRESSION: Status post endovascular complete revascularization of occluded basilar artery and the right posterior cerebral artery with 2 passes with the Embotrap 5 mm x 33 mm retrieval device, and placement of rescue stent in the right posterior cerebral artery for reocclusion due to severe underlying atherosclerotic disease. Patency of the superior cerebellar arteries and the  anterior-inferior cerebellar arteries restored. Left posterior cerebral artery supplied via the left posterior communicating artery from the anterior circulation. PLAN: Patient transferred to the neuro ICU for continued post stroke management. Follow-up in the clinic in approximately 4 weeks post discharge. Electronically Signed   By: Luanne Bras M.D.   On: 01/02/2018 11:12   Ct Cerebral Perfusion W Contrast  Result Date: 12/15/2017 CLINICAL DATA:  Initial evaluation for right-sided neglect, slurred speech. EXAM: CT ANGIOGRAPHY HEAD AND NECK CT PERFUSION BRAIN TECHNIQUE: Multidetector CT imaging of the head and neck was performed using the standard protocol during bolus administration of intravenous contrast. Multiplanar CT image reconstructions and MIPs were obtained to evaluate the vascular anatomy. Carotid stenosis measurements (when applicable) are obtained utilizing NASCET criteria, using the distal internal carotid diameter as the denominator. Multiphase CT imaging of the brain was performed following IV bolus contrast injection. Subsequent parametric perfusion maps were calculated using RAPID software. CONTRAST:  138m ISOVUE-370 IOPAMIDOL (ISOVUE-370) INJECTION 76% COMPARISON:  None available at time of this dictation. FINDINGS: CTA NECK FINDINGS Aortic arch: Examination technically limited by motion artifact. Visualized aortic arch of normal caliber with normal branch pattern. Moderate atherosclerotic change about the aortic arch and origin of the great vessels. Approximate 60% stenosis at the origin of the left subclavian artery. No other flow-limiting stenosis about the origin of the great vessels. Visualized subclavian arteries otherwise grossly patent. Right carotid system: Focal plaque at the origin of the right common carotid artery with associated 60% stenosis. Right common carotid artery otherwise patent to the bifurcation without flow-limiting stenosis. Extensive plaque about the right  bifurcation/proximal right ICA. Resultant fairly severe stenosis of up to approximately 70-80%, although exact termination difficult given prominent calcifications and motion artifact within this region. Right ICA patent distally to the skull base. Left carotid system: Left common carotid artery patent from its origin to the bifurcation without stenosis. Prominent calcification about the left bifurcation/proximal left ICA with associated stenosis of up to approximately 75% by NASCET criteria, although again, evaluation limited. Left ICA patent distally to the skull base without stenosis, dissection or occlusion. Vertebral arteries: Both of the vertebral arteries arise from the subclavian arteries. Right vertebral artery dominant. Focal plaque at the origin of the right vertebral artery with suspected approximate 50% stenosis. Right vertebral artery otherwise patent to the skull base. Left vertebral artery somewhat diminutive. Left V1 and V2 segments patent proximally. There is poor opacification of the left V2 segment at the level of V3 (series 7, image 193), which may be occluded. Contrast opacification is seen distally at the left V3 segment, with the left vert appearing patent as it courses into the cranial vault. Skeleton: No acute osseus abnormality. No discrete osseous lesions. Moderate cervical spondylolysis at C5-6 and C6-7. Other neck: No acute soft tissue abnormality within the neck. Upper chest: Left-sided pacemaker/AICD in place. Sequelae of prior CABG. Cardiomegaly partially visualized. Patchy multifocal consolidative opacities within the visualized lungs, concerning for pneumonia. Underlying pulmonary interstitial edema. Enlarged subcarinal nodal conglomerate measures  2.2 cm, which may be reactive. Review of the MIP images confirms the above findings CTA HEAD FINDINGS Anterior circulation: Petrous segments patent bilaterally. Extensive calcified plaque throughout the carotid siphons with moderate  diffuse narrowing, up to approximately 50%. ICA termini patent. A1 segments patent bilaterally. Left A1 hypoplastic, likely accounting for the diminutive left ICA is compared to the right. Normal anterior communicating artery. Anterior cerebral arteries patent to their distal aspects without stenosis. Left M1 patent without stenosis. Normal left MCA bifurcation. No proximal left M2 occlusion. Distal left MCA branches well perfused. Right M1 widely patent. Normal right MCA bifurcation. No proximal right M2 occlusion. Distal right MCA branches well perfused. Posterior circulation: Scattered non stenotic plaque within the right V4 segment. Right vertebral patent to the vertebrobasilar junction without stenosis. Focal approximate 60% stenosis within the left V4 segment as it penetrates the dural reflection. Moderate multifocal stenoses seen distally within the left V4 segment. Posterior inferior cerebral arteries patent bilaterally. Basilar patent proximally. There is occlusion distally (series 7, image 112), which remains occluded to the basilar tip. Superior cerebellar arteries not seen. There is perfusion of the PCAs bilateral, likely via small posterior communicating arteries. Severe proximal right P2 stenosis (series 10, image 21). Multifocal atheromatous irregularity seen throughout the PCAs distally. Venous sinuses: Patent. Anatomic variants: Small bilateral posterior communicating arteries. Dominant right vertebral artery. Delayed phase: Not performed. Review of the MIP images confirms the above findings CT Brain Perfusion Findings: CBF (<30%) Volume: 73m Perfusion (Tmax>6.0s) volume: 7656mMismatch Volume: Infinite. Infarction Location:Negative infarct by CT perfusion. 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 IMPRESSION: 1. 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 Superior cerebellar arteries  not visualized. PCAs are perfused at this time via collateralization from small posterior communicating arteries. 2. No acute core infarct identified by CT perfusion. 3. Approximate 50% stenosis at the origin of the right vertebral artery. Right vertebral artery dominant and otherwise widely patent to the vertebrobasilar junction. 4. Poor opacification of the distal left V2 segment, which may be occluded. Reconstitution at the V3 segment, which remains patent to the cranial vault. Multifocal moderate V4 stenoses as above. 5. Atheromatous stenoses of up to approximately 70-80% about the carotid bifurcations bilaterally. Evaluation limited by prominent calcifications and motion artifact on this exam. 6. 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 7. 60% stenosis at the origin of the left subclavian artery. 8. Patchy multifocal consolidative opacities within the visualized lungs, concerning for pneumonia. Evidence for underlying pulmonary interstitial edema. Critical Value/emergent results were called by telephone at the time of interpretation on 12/14/2017 at 5:26 am to Dr. CHJoseph Berkshire who verbally acknowledged these results. Electronically Signed   By: BeJeannine Boga.D.   On: 12/30/2017 05:47   Dg Chest Port 1 View  Result Date: 01/09/2018 CLINICAL DATA:  Hypoxia EXAM: PORTABLE CHEST 1 VIEW COMPARISON:  January 07, 2018 FINDINGS: Endotracheal tube tip is 3.6 cm above the carina. Nasogastric tube tip and side port are below the diaphragm. Central catheter tip is in the superior vena cava. No pneumothorax. There is a small left pleural effusion. There is mild bibasilar atelectasis. No consolidation. Heart is upper normal in size with pulmonary vascularity normal. Pacemaker leads are attached the right atrium, right ventricle, and coronary sinus. Patient is status post coronary artery bypass grafting. No adenopathy evident. There is evidence of an old  fracture of the lateral left second rib. There is  aortic atherosclerosis. IMPRESSION: Tube and catheter positions as described without pneumothorax. Small left pleural effusion. Bibasilar atelectasis. No consolidation. Stable cardiac silhouette. There is aortic atherosclerosis. Aortic Atherosclerosis (ICD10-I70.0). Electronically Signed   By: Lowella Grip III M.D.   On: 01/09/2018 07:03   Dg Chest Port 1 View  Result Date: 01/07/2018 CLINICAL DATA:  Check endotracheal tube placement EXAM: PORTABLE CHEST 1 VIEW COMPARISON:  01/05/2018 FINDINGS: Endotracheal tube and nasogastric catheter are noted in satisfactory position. Right-sided PICC line is noted in the superior vena cava. Cardiac shadow remains enlarged. Defibrillator is again seen. Postsurgical changes are again noted. Elevation of the left hemidiaphragm with left basilar atelectasis is again seen and stable. Right lung remains clear. IMPRESSION: Stable tubes and lines as described. Stable left basilar atelectasis. Electronically Signed   By: Inez Catalina M.D.   On: 01/07/2018 06:50   Dg Chest Port 1 View  Result Date: 01/05/2018 CLINICAL DATA:  Encounter for respiratory failure. EXAM: PORTABLE CHEST 1 VIEW COMPARISON:  01/03/2018 FINDINGS: Endotracheal tube is roughly 2.2 cm above the carina. Patient is rotated towards the left. Nasogastric tube extends into the abdomen. Again noted is a left biventricular cardiac ICD. Densities at the left lung base are probably accentuated by the patient rotation. Right arm PICC line extends in the SVC but the tip is not well visualized on this examination. Negative for a pneumothorax. Prior CABG procedure with median sternotomy. Again noted is interstitial prominence suggestive for mild edema or congestion. Heart size remains enlarged. IMPRESSION: Limited evaluation of the left lung base due to patient positioning. Cannot exclude left basilar chest disease. Stable cardiomegaly with evidence of vascular  congestion or mild interstitial edema. Support apparatuses as described. Electronically Signed   By: Markus Daft M.D.   On: 01/05/2018 10:57   Dg Chest Port 1 View  Result Date: 01/03/2018 CLINICAL DATA:  Endotracheal tube.  Cerebral infarction. EXAM: PORTABLE CHEST 1 VIEW COMPARISON:  01/02/2018 FINDINGS: Endotracheal tube terminates 2.7 cm above carina.Nasogastric tube extends beyond the inferior aspect of the film. Pacer/AICD device. Prior median sternotomy for CABG. Cardiomegaly accentuated by AP portable technique. Probable layering left pleural effusion. No pneumothorax. Pulmonary venous congestion, superimposed upon low lung volumes. Developing left base atelectasis. IMPRESSION: Similar cardiomegaly and mild pulmonary venous congestion. Developing left base atelectasis and possible small left pleural effusion. Electronically Signed   By: Abigail Miyamoto M.D.   On: 01/03/2018 08:08   Dg Chest Port 1 View  Result Date: 01/02/2018 CLINICAL DATA:  Respiratory failure.  Follow-up exam. EXAM: PORTABLE CHEST 1 VIEW COMPARISON:  01/01/2018 and older studies. FINDINGS: Endotracheal tube, nasal/orogastric tube and right PICC stable and well positioned. No change in the left anterior chest wall biventricular cardioverter-defibrillator. Stable changes from prior CABG surgery. Lungs demonstrate vascular congestion and chronic interstitial thickening without overt pulmonary edema. Additional basilar opacity is atelectasis. No pneumothorax. IMPRESSION: 1. No significant change from the previous day's study. 2. Chronic interstitial thickening with vascular congestion but no overt pulmonary edema. Mild basilar atelectasis. 3. Support apparatus is stable and well positioned Electronically Signed   By: Lajean Manes M.D.   On: 01/02/2018 07:46   Dg Chest Port 1 View  Result Date: 01/01/2018 CLINICAL DATA:  PICC placement. EXAM: PORTABLE CHEST 1 VIEW COMPARISON:  01/01/2018. FINDINGS: Interval right PICC with its tip  obscured by left subclavian pacer and AICD leads, approximately 5 cm above the superior cavoatrial junction. Endotracheal tube tip 3 cm above the carina. Nasogastric tube extending into the  upper abdomen. Stable enlarged cardiac silhouette and post CABG changes. The interstitial markings remain diffusely prominent with improved aeration at the lung bases. IMPRESSION: 1. Right PICC tip obscured by left subclavian pacer and AICD leads, approximately 5 cm above the superior cavoatrial junction. 2. Stable cardiomegaly and chronic interstitial lung disease. 3. Improved bibasilar atelectasis. Electronically Signed   By: Claudie Revering M.D.   On: 01/01/2018 18:57   Dg Chest Port 1 View  Result Date: 01/01/2018 CLINICAL DATA:  Respiratory failure EXAM: PORTABLE CHEST 1 VIEW COMPARISON:  12/24/2017 FINDINGS: Endotracheal tube terminates 3.5 cm above the carina. Mild perihilar edema, right lower lobe predominant. Less likely, multifocal right lung infection is possible. No definite pleural effusions. No pneumothorax. Heart is top-normal in size. Postsurgical changes related to prior CABG. Left subclavian ICD. Enteric tube courses below the diaphragm. IMPRESSION: Endotracheal tube terminates 3.5 cm above the carina. Suspected mild perihilar edema, right lower lobe predominant. Less likely, multifocal right lung infection is possible. Postoperative changes with additional support apparatus as above. Electronically Signed   By: Julian Hy M.D.   On: 01/01/2018 07:07   Dg Foot Complete Left  Result Date: 12/17/2017 CLINICAL DATA:  Diabetic ulcer. EXAM: LEFT FOOT - COMPLETE 3+ VIEW COMPARISON:  06/09/2017.  01/06/2016. FINDINGS: Amputation of the left second digit. Deformity noted the left fifth metatarsal consistent with old healed fracture. Diffuse osteopenia and degenerative change. No acute bony abnormality. No evidence of fracture or dislocation. Linear metallic foreign body noted over the soft tissues adjacent to  the calcaneus. This could represent a needle fragment. Diffuse soft tissue swelling. IMPRESSION: 1. Metallic foreign body noted over the soft tissues of the calcaneus. This could represent a needle fragment. Diffuse soft tissue swelling. 2. Amputation of the left second digit. Deformity of the left fifth metatarsal consistent with old healed fracture. Diffuse osteopenia and degenerative change. No acute bony abnormality identified. No erosive changes noted. Electronically Signed   By: Marcello Moores  Register   On: 12/17/2017 05:52   Ir Percutaneous Art Thrombectomy/infusion Intracranial Inc Diag Angio  Result Date: 01/04/2018 INDICATION: Progressive neurologic decline with right-sided weakness. CT angiogram of the head and neck revealing occluded mid to distal basilar artery extending into the proximal posterior cerebral arteries. EXAM: 1. EMERGENT LARGE VESSEL OCCLUSION THROMBOLYSIS (POSTERIOR CIRCULATION) COMPARISON:  CT angiogram of the head and neck of 12/29/2017 MEDICATIONS: Ancef 2 g IV antibiotic was administered within 1 hour of the procedure ANESTHESIA/SEDATION: General anesthesia CONTRAST:  Isovue 300 approximately 150 cc FLUOROSCOPY TIME:  Fluoroscopy Time: 66 minutes 42 seconds 4856 mGy). COMPLICATIONS: None immediate. TECHNIQUE: Following a full explanation of the procedure along with the potential associated complications, an informed witnessed consent was obtained patient's daughters. The risks of intracranial hemorrhage of 10%, worsening neurological deficit, ventilator dependency, death and inability to revascularize were all reviewed in detail with the patient's daughters. The patient was then put under general anesthesia by the Department of Anesthesiology at [facility]. A right radial access was utilized for diagnostic and also endovascular treatment in view of patient's absent distal pulses in both feet, and because of poorly visualized to absent common femoral pulses on ultrasound  examination. Patient also has a documented history of severe peripheral vascular disease limiting her ambulation. The right forearm to the radial region was prepped and draped in the usual sterile fashion. A Biuret test revealed only partial depression of the waveforms from the pulse oximeter on the right thumb confirming patency of the dorsopalmar anastomosis of the  right hand. Thereafter using ultrasound guidance transradial access into the right radial artery was obtained without difficulty. Over a 0.018 inch inch guidewire a 5 French radial access sheath was inserted., free aspiration of blood was noted at the hub of the sheath. Patient was given a combination of a 1000 units of heparin, 2.5 mg of verapamil, and 200 mcg of nitroglycerin into the sheath. Thereafter, over a 0.035 inch Roadrunner guidewire, a 5 [place] Simmons 2 diagnostic catheter was advanced to the aortic arch region and positioned in the right subclavian artery, just distal to the origin of the right vertebral artery. FINDINGS: A control arteriogram performed at the site and centered extra cranially and intracranially demonstrated wide patency of the right vertebral artery at its origin with ascent of the vertebral arteries to the cranial skull base. Complete occlusion of the distal half of the basilar artery is noted distal to the origins of the anterior-inferior cerebellar artery. The right posterior-inferior cerebellar artery demonstrates wide patency. The right common carotid artery injection demonstrates patency of the right common carotid artery to the right common carotid bifurcation. The right common carotid bifurcation demonstrates atherosclerotic disease involving the right carotid bifurcation with associated stenosis of approximately 50%. However, there is patency of the right external carotid artery and its branches. The right internal carotid artery distal to the bulb appears widely patent to the cranial skull base. Wide patency is  seen of the petrous, cavernous and supraclinoid segments. A right posterior communicating artery is seen opacifying the right posterior cerebral artery distribution. The opacified portion of the middle cerebral artery and the anterior cerebral artery grossly demonstrates no abnormalities in the visualized portions. 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 Over a 0.035 inch 300 cm Rosen exchange guidewire, the [employer] 2 diagnostic catheter was exchanged for 115 cm 5 [place] Navien guide catheter using biplane roadmap technique and constant fluoroscopic guidance. The guidewire was removed. Good aspiration obtained from the hub of the Navien guide catheter. This was then retrieved gently to just proximal to the origin of the right vertebral artery. Thereafter over a 0.035 inch Roadrunner guidewire, using biplane roadmap technique and constant fluoroscopic guidance, easy access was obtained into the right vertebral artery to the C1-C2 junction followed by the Navien guide catheter. The guidewire was removed. Good aspiration obtained from the hub of the Navien guide catheter. A gentle control arteriogram performed at this site demonstrated no evidence of spasms, dissections or of intraluminal filling defect defects. No change was noted in the occluded basilar artery. At this time, in a coaxial manner and with constant heparinized saline infusion, over a 0.014 inch Softip Synchro micro guidewire, a Trevo ProVue 021 microcatheter was advanced to the distal end of the Navien guide catheter. With the micro guidewire leading with a J-tip configuration, the combination was navigated without difficulty to just  proximal to the occluded basilar artery. Thereafter using a torque device, access with the micro guidewire was obtained into the right superior cerebellar artery followed by the microcatheter. The guidewire was removed. There was good aspiration of blood from the hub of the tip of the microcatheter. A very gentle contrast injection revealed wide patency of the superior cerebellar artery with safe position of tip of the  microcatheter. At this time, in a coaxial manner and with constant heparinized saline infusion using biplane roadmap technique and constant fluoroscopic guidance, an Embotrap 5 mm x 33 mm retrieval device was advanced to the distal end of the microcatheter. The O ring on the delivery microcatheter was then loosened. With slight gentle forward traction with the right hand on the delivery micro guidewire of the retrieval device, the delivery microcatheter was retrieved unsheathing the distal and then the proximal retrieval device. A control arteriogram performed through the Navien guide catheter demonstrated no revascularization noted despite the retrieval device. At this time, with constant aspiration being applied at the hub of the Navien guide catheter with a 25 mL syringe, the combination of the retrieval device and microcatheter were retrieved and removed. A few specks of clot were noted in the interstices of the retrieval device. A gentle control arteriogram performed through the Navien guide catheter in the right vertebrobasilar junction demonstrated no change in the occluded basilar artery. The 021 Trevo ProVue microcatheter was then again advanced under biplane roadmap technique to the proximal portion of the occluded basilar artery. Again using a torque device, attempts were made to access completely through the clot with the micro guidewire with only partial success. The guidewire was then removed. This was then replaced with a 0.016 inch Headliner curved micro guidewire. This was then  advanced to the distal end of the microcatheter. Under biplane roadmap technique, using a torque device, access with this wire was obtained with gentle probing into the left posterior cerebral artery followed by the microcatheter. The guidewire was removed. Good aspiration obtained from the hub of the Trevo ProVue microcatheter which was now positioned in the P2 region of the left posterior cerebral artery. This was then connected to continuous heparinized saline infusion. The 5 mm x 33 mm Embotrap retrieval device was then advanced to the distal end of the microcatheter. Again the O ring on the delivery micro guidewire was loosened. With slight forward gentle traction with the right hand on the the delivery micro guidewire, the microcatheter was unsheathed retrieving the distal and the proximal portion of the retrieval device. A gentle control arteriogram performed through the Navien guide catheter demonstrated partial revascularization of the basilar artery and also no visibility of the right posterior cerebral artery. However, the left posterior cerebral artery remained occluded. At this time, the combination of the retrieval device and the microcatheter were gently retrieved as constant aspiration was applied with a [place]umbra vacuum suctioning at the hub of the Navien guide catheter. Aspiration was continued as the retrieval device was removed. Chunks of clot were noted in the container of the vacuum suction device. Also noted were a few specks of clot into in the interstices of the retrieval device. A control arteriogram performed through the Navien guide catheter in the right vertebrobasilar junction now demonstrated revascularized posterior cerebral arteries, superior cerebellar arteries and the anterior-inferior cerebellar arteries. There appeared to be severe stenosis noted in the right posterior cerebral artery P2 segment,. There was perfusion noted in the left posterior cerebral artery additionally. A  control arteriogram performed approximately 3 minutes demonstrated progressive occlusion of the right posterior cerebral artery. Patency of the left posterior cerebral artery was maintained. It was, therefore, decided to perform a rescue stent to maintain patency of the occluding right posterior cerebral artery. Through the 8 [place] Navien guide catheter, an 021 Trevo ProVue microcatheter was advanced over a 0.014 inch Softip Synchro microguidewire to the proximal basilar artery. Using a  torque device, access into the right posterior cerebral artery P2 P3 segment was obtained without difficulty. The guidewire was removed. Good aspiration obtained from the hub of the microcatheter. This was then connected to continuous heparinized saline infusion. At this time, a 4 mm x 30 mm Enterprise vascular reconstruct device was advanced to the distal end of the microcatheter using biplane roadmap technique and constant fluoroscopic guidance. Proximal and distal markers were then defined for position. The O ring on the delivery microcatheter was loosened. With slight forward gentle traction with the right hand on the delivery micro guidewire with the left hand, the delivery microcatheter was unsheathed deploying the stent in the right posterior cerebral artery and in the distal 1/3 of the basilar artery. A control arteriogram performed through the Navien guide catheter demonstrated near complete revascularization of the right posterior cerebral artery, and the superior cerebellar arteries bilaterally. The anterior-inferior cerebellar arteries remain patent as well. There was now occlusion of the left posterior cerebral artery. Placement of a rescue stent at this site was also considered. However, after evaluating the CT angiogram of the patient prior to the procedure patency of the left posterior communicating artery was noted opacifying the left posterior cerebral artery distribution. Also noted was the angiographic patency of  the left posterior cerebral artery earlier in the angiogram. A control arteriogram performed through the Navien guide catheter in right vertebrobasilar junction continued to demonstrate wide patency of the right posterior cerebellar, the superior cerebellar arteries and the anterior-inferior cerebellar arteries. The Navien guide catheter was removed without difficulty. Good aspiration obtained at the hub of the radial sheath in the right radial artery. A radial wrist band was then applied with the green marker just north of the skin entry site on the radial artery. The wrist band was then inflated with 18 cc of the air. This was then removed with no bleeding noted. The pressure from the wrist band was gently released until there was a palpable radial pulse. The wrist band was left at this pressure for hemostasis. A regional pulse appeared palpable. Good color was noted in the fingers of the right hand. Also noted was a mild focal prominence of the upper right forearm. A quick ultrasound at this site revealed a superficial hematoma measuring approximately 4 cm x 5 cm. There was no vascular compromise noted. The brachial and the right radial pulse were palpable without difficulty. A circular pressure band was then applied at this site. The patient was then transferred to the neuro ICU intubated. Throughout the procedure, the patient's blood pressure and neurological status remained stable. IMPRESSION: Status post endovascular complete revascularization of occluded basilar artery and the right posterior cerebral artery with 2 passes with the Embotrap 5 mm x 33 mm retrieval device, and placement of rescue stent in the right posterior cerebral artery for reocclusion due to severe underlying atherosclerotic disease. Patency of the superior cerebellar arteries and the anterior-inferior cerebellar arteries restored. Left posterior cerebral artery supplied via the left posterior communicating artery from the anterior  circulation. PLAN: Patient transferred to the neuro ICU for continued post stroke management. Follow-up in the clinic in approximately 4 weeks post discharge. Electronically Signed   By: Luanne Bras M.D.   On: 01/02/2018 11:12   [place] Ekg Site Rite  Result Date: 01/01/2018 If Site Rite image not attached, placement could not be confirmed due to current cardiac rhythm.  [place] Ekg Site Rite  Result Date: 01/01/2018 If [facility] image not attached, placement could not  be confirmed due to current cardiac rhythm.   PERFORMANCE STATUS (ECOG) : 4 - Bedbound  Review of Systems Unable to provide  Physical Exam General: ill appearing in ICU HEENT: ETT Lungs: Clear to auscultation bilaterally.  Heart: paced rhythm, regular Abdomen: Soft, nondistended. Musculoskeletal: No edema Neuro: unresponsive  IMPRESSION: Patient remains critically ill in ICU on ventilator.  She is currently unresponsive.  However, she is sedated on fentanyl drip.  She is comfortable appearing.  No family at bedside.   I called and spoke with patient's son, Audelia Acton, who says he is patient's Marine scientist.  He confirms plan for extubation tomorrow afternoon.  He recognizes that patient may not do well with extubation and does not intend to reintubate.  In the event of distress, son says he would opt for comfort measures. All questions answered and emotional support provided to son. Will follow.   PLAN: Continue supportive care for now.  Son leaning toward one way extubation tomorrow (9/30) afternoon.    Family expressed understanding and was in agreement with this plan.    Time In: 0800 Time Out: 0830 Time Total: 30 minutes  Visit consisted of counseling and education dealing with the complex and emotionally intense issues of symptom management and palliative care in the setting of serious and potentially life-threatening illness.Greater than 50%  of this time was spent counseling and coordinating care related to  the above assessment and plan.  Signed by: Altha Harm, [place], NP-C, [place] (Work Cell)

## 2018-01-10 LAB — CULTURE, BLOOD (ROUTINE X 2)
Culture: NO GROWTH
Culture: NO GROWTH
SPECIAL REQUESTS: ADEQUATE
Special Requests: ADEQUATE

## 2018-01-10 LAB — GLUCOSE, CAPILLARY
GLUCOSE-CAPILLARY: 213 mg/dL — AB (ref 70–99)
GLUCOSE-CAPILLARY: 197 mg/dL — AB (ref 70–99)

## 2018-01-10 MED ORDER — GLYCOPYRROLATE 1 MG PO TABS
1.0000 mg | ORAL_TABLET | ORAL | Status: DC | PRN
  Filled 2018-01-10: qty 1

## 2018-01-10 MED ORDER — MORPHINE 100MG IN NS 100ML (1MG/ML) PREMIX INFUSION
0.0000 mg/h | INTRAVENOUS | Status: DC
  Administered 2018-01-10: 1 mg/h via INTRAVENOUS
  Filled 2018-01-10 (×2): qty 100

## 2018-01-10 MED ORDER — GLYCOPYRROLATE 0.2 MG/ML IJ SOLN: 0.2000 mg | INTRAMUSCULAR | Status: DC | PRN

## 2018-01-10 MED ORDER — MORPHINE BOLUS VIA INFUSION
0.0000 mg | INTRAVENOUS | Status: DC | PRN
  Administered 2018-01-10 (×2): 4 mg via INTRAVENOUS
  Administered 2018-01-10: 3 mg via INTRAVENOUS
  Administered 2018-01-10: 2 mg via INTRAVENOUS
  Administered 2018-01-10: 1 mg via INTRAVENOUS
  Administered 2018-01-10: 4 mg via INTRAVENOUS
  Administered 2018-01-10: 2 mg via INTRAVENOUS
  Filled 2018-01-10: qty 4

## 2018-01-10 MED ORDER — MIDAZOLAM HCL 2 MG/2ML IJ SOLN
2.0000 mg | INTRAMUSCULAR | Status: DC | PRN
  Administered 2018-01-10: 2 mg via INTRAVENOUS
  Filled 2018-01-10: qty 2

## 2018-01-11 NOTE — Progress Notes (Signed)
NAME:  [name] [name], MRN:  [id number], DOB:  Nov 07, 1945, LOS: 10 ADMISSION DATE:  01/09/2018, CONSULTATION DATE:  9/20 REFERRING MD: Pearlean Brownie , CHIEF COMPLAINT:   MCA Ischemic stroke/ VDRF  Brief History   72 y.o. female with a history of PVD and CHF as well as Afib on eliquis who was last seen by family around 7:30 PM.  She arrived to the ED 9/20 am and she was severely dysarthric with right sided weakness, but able to understand and nod/shake her head.  She was found to have CVA or R MCA felt to be secondary to Afib. Out of window for tPA but had successful IR revascularization. Course has been complicated by evolving infarct and cerebral edema requiring 3%. Decreased mental status limiting extubation.  After the family meeting patient is DNR. Plan included no escalation of care with one way extubation.  Significant Hospital Events   9/20  Admission 9/20  CT Brain: Positive CTA for EVLO with occlusive thrombus within the distal basilar artery. 9/21  3% saline added at 25cc/hr for Mild increased edema and local mass effect and NA of 136 9/23  3% off, weaned 10 hours on PSV 15/5  Consults: date of consult/date signed off & final recs:  Pulmonary 9/20 IR 9/20 PT/OT/ Speech 9/20 Stroke Team 9/20  Procedures (surgical and bedside):  9/20  SP RT Vertbral artery angiogram followed complete revascularization og occluded mid to distal basilar artery 9/20 ETT  Significant Diagnostic Tests:  9/20>> CT Brain: Positive CTA for EVLO with occlusive thrombus within the distal basilar artery. 12/16/2017 Echo:  Extremely limited; EF 35-40%, definity used; apical akinesis; hypokinesis of the basal inferior wall; overall moderate LV dysfunction; moderate LVE; mild LVH; trace AI; mild MR; moderate LAE. 12/24/2017 TTE : Extremely limited; definity used; apical akinesis; hypokinesis of  the basal inferior wall; overall moderate LV dysfunction;  moderate LVE; mild LVH; trace AI; mild MR; moderate LAE. CT head  9/21 >> Subacute infarct involving left occipital lobe, left SCA distribution of cerebellum, and right thalamus. Mild increased edema and local mass effect. No herniation or associated hemorrhage. 9/24 CT head > Evolving acute to subacute LEFT greater than RIGHT cerebellar, RIGHT thalamus and LEFT occipital posterior circulation infarcts. No hemorrhagic conversion. No new infarct. Mild mass effect on fourth ventricle, no obstructive hydrocephalus.  Micro Data: 12/07/2017>> Recent toe infection>>STAPHYLOCOCCUS AUREUS   9/20 MRSA PCR >> neg Blood 9/25 >>>  Antimicrobials:  9/20 cefazolin (pre-op)  Subjective:  RN reports patients family left for the morning, expecting withdrawal of aggressive interventions at 4pm / transition to comfort measures.   Objective   Blood pressure (!) 103/47, pulse 70, temperature 100.2 F (37.9 C), temperature source Axillary, resp. rate (!) 32, height 5\' 1"  (1.549 m), weight 105.3 kg, SpO2 99 %.    Vent Mode: PCV FiO2 (%):  [30 %-40 %] 40 % Set Rate:  [15 bmp] 15 bmp PEEP:  [5 cmH20] 5 cmH20 Plateau Pressure:  [14 cmH20-22 cmH20] 17 cmH20   Intake/Output Summary (Last 24 hours) at 2018/01/27 0846 Last data filed at 2018/01/27 0800 Gross per 24 hour  Intake 2681.18 ml  Output 1480 ml  Net 1201.18 ml   Filed Weights   01/08/18 0500 01/09/18 0400 01/27/18 0300  Weight: 106.2 kg 101.7 kg 105.3 kg    Examination: General: elderly female lying in bed on vent   HEENT: MM pink/moist, ETT Neuro: no response to verbal stimuli, sternal rub or painful stimuli  CV: s1s2 rrr,  no m/r/g PULM: even/non-labored, lungs bilaterally coarse  GI: soft, non-tender, bsx4 active  Extremities: warm/dry, no edema  Skin: no rashes or lesions  Resolved Hospital Problem list     Assessment & Plan:   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 S/p IR revascularization on 9/20.  Complicated by cerebral edema now s/p hypertonic saline. Follow up CT 9/24 shows evolving infarcts. Off sedation since 9/20 and remains slow to demonstrate any meaningful improvement. P: Plans per primary No further BP parameters  Continue ASA, brilinta, lipitor for now  Plan is for one way extubation 9/30  Acute Respiratory Insufficiency in setting of Cerebral Infarction  P: Plan for one way extubation 9/30 on family arrival (around 4pm) Continue PCV > may need to transition to [facility]  PRN albuterol  Would consider transition to morphine gtt with goal titration of normal respiratory rate  Atrial Fibrillation on Eliquis  Hx. CHF, Pulmonary HTN, Bi Ventricular pacemeaker, PVD - TTE Jan 20, 2018 w/ EF 35-40%;  Limited study; apical akinesis; hypokinesis of  the basal inferior wall; overall moderate LV dysfunction;   moderate LVE; mild LVH; trace AI; mild MR; moderate LAE. P: Hold home vasotec, coreg Hold further lasix   Hypokalemia P:  Hold further labs, KCL replacement   On Eliquis at home - normal coags on admit, some question of compliance P: SCD's  No further labs  Leukocytosis - Recent toe infection 11/2017 + for STAPHYLOCOCCUS AUREUS. Now having low grade fevers and continued mild leukocytosis P: No further labs / abx  Control fever for comfort   DM - Changes made 9/25 result in consistent readings under 180 P:  Hold further labs, SSI  Hold TF   Hypothyroid P:  Discontinue synthroid  No further labs  Elevated Triglycerides - Most likely 2/2 Cleviprex/ propofol, which have been stopped.  P: Hold further lipitor    Disposition / Summary of Today's Plan 01/06/2018       Diet: Hold further TF  Pain/Anxiety/Delirium protocol : fentanyl gtt VAP protocol: yes DVT prophylaxis: SQ heparin, SCDs GI prophylaxis: Protonix  Hyperglycemia protocol: Hold further SSI Mobility: BR Code Status: DNR Family Communication:  No family available at bedside am 9/30.  Will update on  arrival.   Labs   CBC: Recent Labs  Lab 01/05/18 0404 01/06/18 0347 01/07/18 0558 01/08/18 0505 01/09/18 0434  WBC 11.9* 11.8* 13.4* 12.7* 20.8*  NEUTROABS  --   --   --   --  16.8*  HGB 10.2* 10.2* 10.7* 10.3* 10.7*  HCT 33.8* 33.9* 36.4 34.7* 36.8  MCV 98.3 99.1 100.3* 100.6* 101.1*  PLT 286 288 319 323 339   Basic Metabolic Panel: Recent Labs  Lab 01/03/18 1629  01/04/18 0349 01/05/18 0404 01/06/18 0347 01/07/18 0558 01/08/18 0505 01/09/18 0434  NA  --    < > 144 145 144 147* 149* 150*  K  --    < > 3.1* 3.4* 3.5 4.0 4.0 4.3  CL  --    < > 109 108 108 109 113* 114*  CO2  --    < > 26 27 30 29 30 28   GLUCOSE  --    < > 197* 182* 188* 231* 180* 208*  BUN  --    < > 22 25* 30* 38* 38* 47*  CREATININE  --    < > 0.73 0.72 0.72 0.86 0.78 1.02*  CALCIUM  --    < >  8.7* 8.9 8.9 9.3 9.2 9.2  MG 1.9  --  1.8 1.8 2.1  --  2.4 2.3  PHOS 2.6  --   --  3.8  --   --  3.3  --    < > = values in this interval not displayed.   GFR: Estimated Creatinine Clearance: 57.4 mL/min (A) (by C-G formula based on SCr of 1.02 mg/dL (H)). Recent Labs  Lab 01/04/18 0349  01/06/18 0347 01/07/18 0558 01/08/18 0505 01/09/18 0434  PROCALCITON <0.10  --   --   --   --   --   WBC 11.5*   < > 11.8* 13.4* 12.7* 20.8*   < > = values in this interval not displayed.   Liver Function Tests: Recent Labs  Lab 01/05/18 0404 01/08/18 0505 01/09/18 0434  AST  --  45* 55*  ALT  --  26 31  ALKPHOS  --  37* 40  BILITOT  --  0.9 1.1  PROT  --  6.5 6.8  ALBUMIN 2.7* 2.6* 2.5*   No results for input(s): LIPASE, AMYLASE in the last 168 hours. No results for input(s): AMMONIA in the last 168 hours. ABG    Component Value Date/Time   PHART 7.457 (H) 01/09/2018 0607   PCO2ART 39.9 01/09/2018 0607   PO2ART 84.3 01/09/2018 0607   HCO3 27.8 01/09/2018 0607   TCO2 26 01/04/2018 0426   ACIDBASEDEF 5.0 (H) 01/01/2018 0333   O2SAT 96.9 01/09/2018 0607    Coagulation Profile: Recent Labs  Lab  01/09/18 0434  INR 1.32   Cardiac Enzymes: No results for input(s): CKTOTAL, CKMB, CKMBINDEX, TROPONINI in the last 168 hours.   HbA1C: Hgb A1c MFr Bld  Date/Time Value Ref Range Status  12/30/2017 10:04 AM 8.4 (H) <5.7 % of total Hgb Final    Comment:    For someone without known diabetes, a hemoglobin A1c value of 6.5% or greater indicates that they may have  diabetes and this should be confirmed with a follow-up  test. . For someone with known diabetes, a value <7% indicates  that their diabetes is well controlled and a value  greater than or equal to 7% indicates suboptimal  control. A1c targets should be individualized based on  duration of diabetes, age, comorbid conditions, and  other considerations. . Currently, no consensus exists regarding use of hemoglobin A1c for diagnosis of diabetes for children. Marland Kitchen   08/09/2017 09:03 AM 9.2 (H) <5.7 % of total Hgb Final    Comment:    For someone without known diabetes, a hemoglobin A1c value of 6.5% or greater indicates that they may have  diabetes and this should be confirmed with a follow-up  test. . For someone with known diabetes, a value <7% indicates  that their diabetes is well controlled and a value  greater than or equal to 7% indicates suboptimal  control. A1c targets should be individualized based on  duration of diabetes, age, comorbid conditions, and  other considerations. . Currently, no consensus exists regarding use of hemoglobin A1c for diagnosis of diabetes for children. .    CBG: Recent Labs  Lab 01/09/18 1627 01/09/18 1907 01/09/18 2356 01/29/18 0319 01-29-2018 0727  GLUCAP 227* 258* 237* 213* 197*    Canary Brim, NP-C [facility] Pgr: [phone] or if no answer [phone] 01/29/2018, 8:46 AM

## 2018-01-11 NOTE — Progress Notes (Signed)
Time of death pronounced 1716, verified with 2nd RN. Dr. Pearlean Brownie notified. Family at bedside, emotional support given.

## 2018-01-11 NOTE — Progress Notes (Signed)
25 mLs of morphine wasted in sharps with Roselyn Bering, RN

## 2018-01-11 NOTE — Progress Notes (Signed)
Pt extubated per withdrawal order. Family and RN at bedside. 

## 2018-01-11 NOTE — Progress Notes (Signed)
Nutrition Brief Note  Chart reviewed. Pt now transitioning to comfort care.  No further nutrition interventions warranted at this time.  Please re-consult as needed.   Tj Kitchings RD, LDN, CNSC [phone] Pager [phone] After Hours Pager    

## 2018-01-11 NOTE — Progress Notes (Signed)
Chaplain learned through nurse huddle that patient was being extubated at 4PM.  Chaplain came at 3:40 to provide emotional and spiritual support to the family.  She provided a prayers and scripture.  Patient at 4:45 still was not been extubated; family awaiting arrival of another.  Chaplain will stop by room as she departs today, and refer family to incoming on-call chaplain.  Lynnell Chad Pager [phone]

## 2018-01-11 NOTE — Progress Notes (Signed)
NEUROHOSPITALISTS STROKE TEAM - DAILY PROGRESS NOTE   SUBJECTIVE Patient `s family is not at bedside.  Patient wasrestless last night requiring fentanyl for sedation.Son has met with palliative care nurse practitioner and plans on one with extubation this afternoon.she remains on fentanyl for sedation OBJECTIVE Most recent Vital Signs: Vitals:   2018/01/24 0800 01/24/18 0900 01/24/18 1000 Jan 24, 2018 1100  BP: (!) 103/47 (!) 93/45 (!) 103/39 (!) 95/46  Pulse: 70 70 70 71  Resp: (!) 32 (!) 23 (!) 25 19  Temp: 100.2 F (37.9 C)     TempSrc: Axillary     SpO2: 99% 95% 98% 98%  Weight:      Height:       CBG (last 3)  Recent Labs    01/09/18 2356 01-24-2018 0319 01/24/18 0727  GLUCAP 237* 213* 197*    Physical Exam  HEENT-intubated.   Cardiovascular- irregularly irregular heart rhythm Lungs-vented breath sounds.  Saturations within normal limits Abdomen-obese Musculoskeletal: Pulses weak in right lower extremities, only audible with Doppler and +1 in left lower extremity Skin-warm and dry.  Left groin dressing clean dry and intact no hematoma noted  Neuro:  Intubated off sedation. Eyes closed but barely open on voice or pain.  Lethargic, barely follows limited simple commands.  PERRL, fixed middle position, not lateral gaze. Corneal weakly positive b/l. Weak gag and cough. Not blinking to visual threat. Facial symmetry not able to test due to ET tube. LUE mild withdraw to pain and flaccid on the RUE. BLE mild withdraw to pain but able to wiggle toes on the right. DTR diminished and bilateral babinski. Sensation, coordination and gait not tested.  Lipid Panel    Component Value Date/Time   CHOL 135 01/01/2018 0323   TRIG 206 (H) 01/01/2018 0323   HDL 21 (L) 01/01/2018 0323   CHOLHDL 6.4 01/01/2018 0323   VLDL 41 (H) 01/01/2018 0323   LDLCALC 73 01/01/2018 0323   LDLCALC 118 (H) 08/09/2017 0903   HgbA1C  Lab Results    Component Value Date   HGBA1C 8.4 (H) 12/30/2017    Urine Drug Screen:      Component Value Date/Time   LABOPIA NONE DETECTED 12/12/2017 1747   COCAINSCRNUR NONE DETECTED 01/06/2018 1747   LABBENZ NONE DETECTED 12/28/2017 1747   AMPHETMU NONE DETECTED 12/27/2017 1747   THCU NONE DETECTED 12/28/2017 1747   LABBARB NONE DETECTED 01/04/2018 1747    Alcohol Level:  No results for input(s): ETH in the last 168 hours.  Ct Angio Head/ Neck W Or Wo Contrast/ CT Perfusion  12/29/2017 IMPRESSION:  1. 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 Superior cerebellar arteries not visualized. PCAs are perfused at this time via collateralization from small posterior communicating arteries.  2. No acute core infarct identified by CT perfusion.  3. Approximate 50% stenosis at the origin of the right vertebral artery. Right vertebral artery dominant and otherwise widely patent to the vertebrobasilar junction.  4. Poor opacification of the distal left V2 segment, which may be occluded. Reconstitution at the V3 segment, which remains patent to the cranial vault. Multifocal moderate V4 stenoses as above.  5. Atheromatous stenoses of up to  approximately 70-80% about the carotid bifurcations bilaterally. Evaluation limited by prominent calcifications and motion artifact on this exam.  6. 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  7. 60% stenosis at the origin of the left subclavian artery.  8. Patchy multifocal consolidative opacities within the visualized lungs, concerning for pneumonia. Evidence for underlying pulmonary interstitial edema.   Dg Chest 1 View 01/03/2018 IMPRESSION:  Tube positions as described without pneumothorax. Pulmonary vascular congestion. Mild perihilar pulmonary edema. No frank consolidation. Left base atelectasis noted. Postoperative changes noted.    Ct Head Wo Contrast 12/29/2017 IMPRESSION:  1.  Involving nonhemorrhagic infarct involving the left occipital lobe is described. This is consistent with acute left PCA occlusion.  2. No other acute or focal cortical or basal ganglia infarct evident.  3. Moderate diffuse white matter disease.  4. Anterior ethmoid and frontal sinus disease may be related to intubation.  EKG :A. fib /a flutter  Ct Head Wo Contrast 01/01/2018 IMPRESSION:  Subacute infarct involving left occipital lobe, left SCA distribution of cerebellum, and right thalamus. Mild increased edema and local mass effect. No herniation or associated hemorrhage.    Dg Chest Port 1 View 01/01/2018 IMPRESSION:  Endotracheal tube terminates 3.5 cm above the carina. Suspected mild perihilar edema, right lower lobe predominant. Less likely, multifocal right lung infection is possible. Postoperative changes with additional support apparatus as above.   Ct Head Wo Contrast 01/04/2018 IMPRESSION:  1. Evolving acute to subacute LEFT greater than RIGHT cerebellar, RIGHT thalamus and LEFT occipital posterior circulation infarcts. No hemorrhagic conversion. No new infarct.  2. Mild mass effect on fourth ventricle, no obstructive hydrocephalus.       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   CTA head & neck : Positive CTA for evolving LVO with occlusive thrombus within the distal basilar artery. Superior cerebellar arteries not visualized. Approximate 50% stenosis at the origin of the right vertebral artery,  Atheromatous stenoses of up to approximately 70-80% about the carotid bifurcations bilaterally. 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   MRI of the brain : not performed due to pacemaker  Repeat Head CT 9/20: Evolving Left PCA nonhemorrhagic  infarct.   Repeat CT head 9/21 - left PCA, left SCA and right thalamic infarcts  Repeat CT 9/24 - bilateral cerebellum, right thalamus, left occipital infarcts, no hydrocephalus.  2D Echocardiogram EF 35 to 40%  LDL 118  HgbA1c 8.4  VTE: heparin subq  Antiplatelets : Aspirin 81 mg daily/Brilinta 90 mg twice daily due to stenting  Continue Rehab with PT consult, OT consult, Speech consult  Therapy recommendations:   Pending  Disposition:   Pending  Dr. Halford Chessman discussed with son 01/07/18 and he is leaning to one way extubation if no improvement for the next 2-3 days. However, at meantime, will continue current management.   Atrial Fibrillation  Home anticoagulation:   Eliquis  Home medications, Betapace   CHA2DS2-VASc Score = 6             Age in Years:  ?82                         Sex:  Female   Female  Hypertension History:  yes                          Diabetes Mellitus:  0             Congestive Heart Failure History:  0             Vascular Disease History:  yes                             Stroke/TIA/Thromboembolism History:  yes     Meds: Coreg  Decision on resuming AC will based on treatment plan and family discussion.    Respiratory failure  Intubated off sedation  CCM on board  Tolerated weaning trial so far  However, mental status and brainstem reflexes not enough for extubation  May need trach vs. One way extubation vs. Comfort care  Hypertension urgency  Stable off cleviprex  BP goal < 160 post procedure  Home medications: Enalapril, Coreg  Hyperlipidemia  Home meds:   None  LDL 118, goal < 70  On lipitor 48m daily  Continue statin at discharge   Uncontrolled diabetes mellitus  A1C 8.4, goal < 7.0  SSI  CBG monitoring  Hyperglycemia much improved, glucose 170-190  Continue lantus 20U bid, and 5units NovoLog every 4 hours for tube feeding coverage  DM coordinator recs appreciated  NPO - currently no  TF   CHF/CAD s/p CABG  Currently on aspirin and Brilinta post cardiac stent  Continue coreg and home lasix  CCM on board  Low grade fever  Could be reason for her lethargy  Tmax 100.5->100.4-> 100.8  UA neg  CXR no frank penumonia  Stable leukocytosis 11.5->11.9->11.8->13.4  blood culture pending - No growth 4 days   CCM on board  Dysphagia  On tube feeding  CBG every 4 hours  NovoLog coverage  Other Stroke Risk Factors  Advanced age  Former smoker  PVD   Other Active Problems  GERD-continue PPI  Hypothyroidism-continue Synthroid  Palliative Care consult 01/09/18 included discussion with family - possible extubation as above. Appreciate Palliative Care's assistance.   Hospital day # 10  I had a long discussion with the patient's son and sister at the bedside and discussed her prognosis and explained that she likely may need a tracheostomy and enteric tube and prolonged stay in rehabilitation and nursing home. The son hasn't decided on comfort care and extubation and hopefully this will be done this afternoon. Discussed with Dr.Byrum critical care medicine This patient is critically ill due to posterior infarcts, dysphagia, respiratory failure, BA occlusion s/p IR and at significant risk of neurological worsening, death form recurrent strokes, hemorrhagic conversion, cerebral edema, dysphagia, aspiration, seizure. This patient's care requires constant monitoring of vital signs, hemodynamics, respiratory and cardiac monitoring, review of multiple databases, neurological assessment, discussion with family, other specialists and medical decision making of high complexity. I spent 30 minutes of neurocritical care time in the care of this patient. PAntony Contras MD Medical Director [facility] Pager: [phone]     To contact Stroke Continuity provider, please refer to [email or web address] After hours, contact General Neurology

## 2018-01-11 DEATH — deceased

## 2018-01-24 ENCOUNTER — Telehealth: Payer: Self-pay | Admitting: Family Medicine

## 2018-01-24 NOTE — Telephone Encounter (Signed)
[facility] dropped off form to be filled out on pt. Placed in form box at nurses desk

## 2018-01-24 NOTE — Telephone Encounter (Signed)
Form in dr steve's folder 

## 2018-01-26 ENCOUNTER — Telehealth: Payer: Self-pay | Admitting: Family Medicine

## 2018-01-26 NOTE — Telephone Encounter (Signed)
This form was dropped off by [employer] funeral because patient passed away January 12, 2018 and they are needing records for insurance claim/ burial claim In your box

## 2018-02-09 ENCOUNTER — Ambulatory Visit: Payer: Medicare Other | Admitting: Cardiology

## 2018-02-11 NOTE — Discharge Summary (Signed)
Patient ID: [name] [name] MRN: [id number] DOB/AGE: 1945-05-03 72 y.o.  Admit date: 2018-01-01 Death date: Jan 11, 2018 at 1716 hrs  Admission Diagnoses:Left Sided weakness  Cause of Death:  Respiratory failure due to multiple posterior circulation infarcts due to basilar artery occlusion status post mechanical thrombectomy secondary to atrial fibrillation. Patient made DO NOT RESUSCITATE and comfort care by family and ventilatory support withdrawn  Pertinent Medical Diagnosis: Active Problems:   Hyperlipidemia   Atrial fibrillation, chronic   Stroke (cerebrum) (HCC)   Basilar artery embolism   Hypertensive emergency   Uncontrolled type 2 diabetes mellitus with hyperglycemia (HCC)   Oropharyngeal dysphagia   Endotracheally intubated   Palliative care encounter   Hospital Course: [name] is a 72 y.o. female with a history of PVD and CHF as well as afib on eliquis who was last seen by family around 7:30 PM.  She is severely dysarthric, but is able to understand and nod/shake her head and when I asked her if she was normal when she awoke at 1 AM she indicates that she was developed symptoms after that.  I am uncertain of the exact time of onset, however.  She has a history of CHF and peripheral vascular disease and due to this, she is not able to walk long distance, but she does not need any help with any of her activities of daily living. LKW: 1 AM on 01-01-2018 tpa given?: no, out of window Modified Rankin Scale: 2-Slight disability-UNABLE to perform all activities but does not need assistance NIHSS:13    After taking informed consent from the family patient underwent emergent mechanical thrombectomy of the basilar artery occlusion with acute to be reperfusion and left posterior cerebral artery stent. The patient was intubated and admitted to the intensive care unit. Blood pressure tightly controlled. Subsequent follow-up CT scan showed nonhemorrhagic left posterior cerebral artery, left  superior cerebellar artery and right thalamic infarcts. There are also bilateral cerebellar and left occipital infarcts. Stroke workup showed echocardiogram with ejection fraction of 35-40% but no clot. Serial cholesterol 180 mg percent and lipids and A1c was 8.4. Patient was started on aspirin and Brilinta was 10. She was also an atrial fibrillation and was on eliquis prior to admission. The patient's unfortunately did not show good neurological improvement. Neurological exam is quite poor and she would barely open eyes but was not tracking of falling commands. She would withdraw the left-sided semi-purposefully to pain and not able to move the right upper extremity even to pain with only trace withdrawal in the right leg. Since patient condition did not show any significant improvement after several meetings with the patient's son and sister the family understood the poor prognosis and did not want patient to have prolonged ventilatory support, tracheostomy, PEG tube and prolonged stay in a nursing home. The patient was made DO NOT RESUSCITATE and comfort care and respiratory support was withdrawn. Patient was kept comfortable and peacefully passed away.     Signed: Delia Heady 01/11/2018, 5:03 PM

## 2018-03-03 DIAGNOSIS — Z029 Encounter for administrative examinations, unspecified: Secondary | ICD-10-CM

## 2018-03-09 ENCOUNTER — Other Ambulatory Visit: Payer: Self-pay

## 2018-03-09 NOTE — Patient Outreach (Signed)
Patient is deceased, unable to obtain mRs. mRs= 6.

## 2018-03-14 ENCOUNTER — Encounter

## 2018-03-14 ENCOUNTER — Ambulatory Visit: Payer: Medicare Other | Admitting: Gastroenterology

## 2018-04-28 ENCOUNTER — Encounter (HOSPITAL_COMMUNITY): Payer: Medicare Other

## 2018-04-29 ENCOUNTER — Ambulatory Visit: Payer: Medicare Other | Admitting: Family

## 2019-02-11 IMAGING — CT CT HEAD W/O CM
3 series · 14 of 47 positions shown, 16 images · non-contrast
Comparison: 12/31/2017 CT head, CTA head, CT perfusion head.

CLINICAL DATA: 72 y/o  F; stroke for follow-up.

EXAM:
CT HEAD WITHOUT CONTRAST
TECHNIQUE: Contiguous axial images were obtained from the base of the skull
through the vertex without intravenous contrast.

[Series 3: head 5.0 h30s · axial · 0.44mm/px · z∈[-146,+9]mm · 8 of 37 slices shown, 10 images]
[im 3/37  brain]
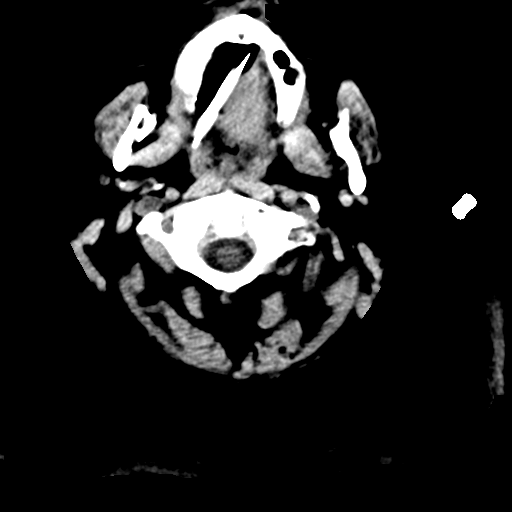
[im 3/37  bone]
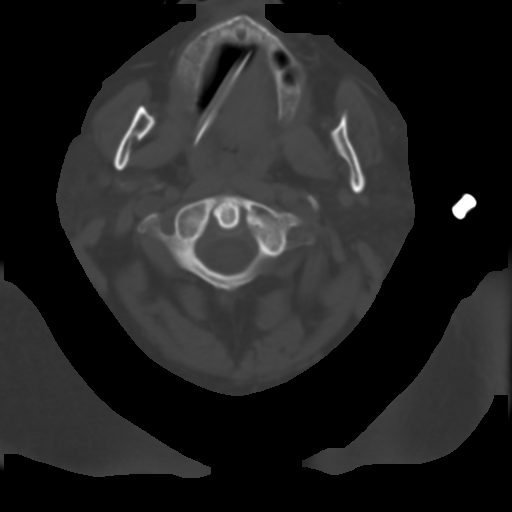
[im 8/37  brain]
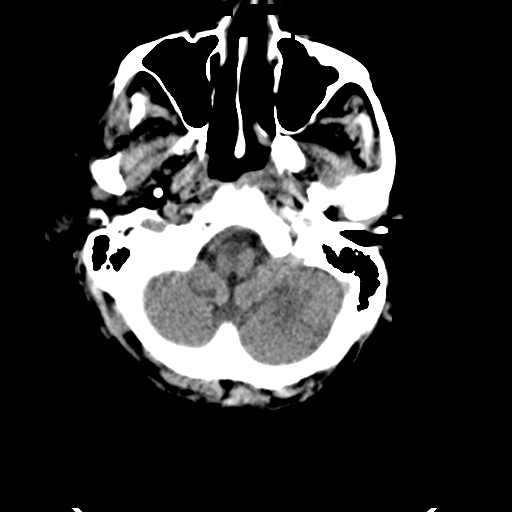
[im 12/37  brain]
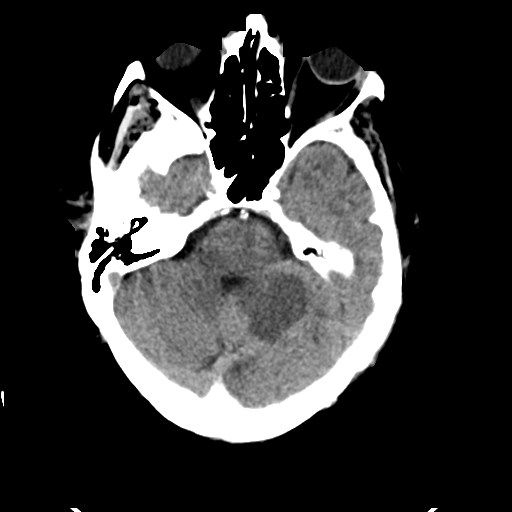
[im 17/37  brain]
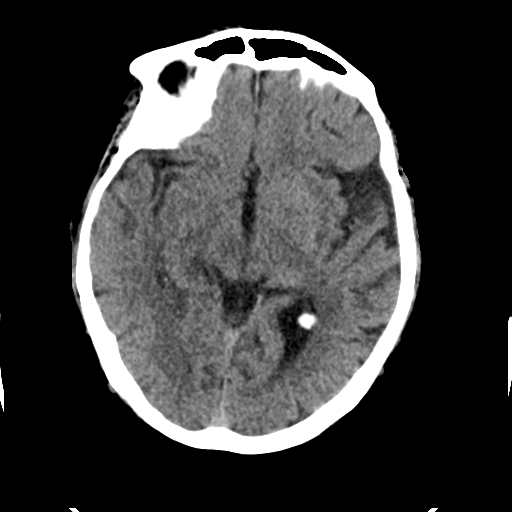
[im 20/37  brain]
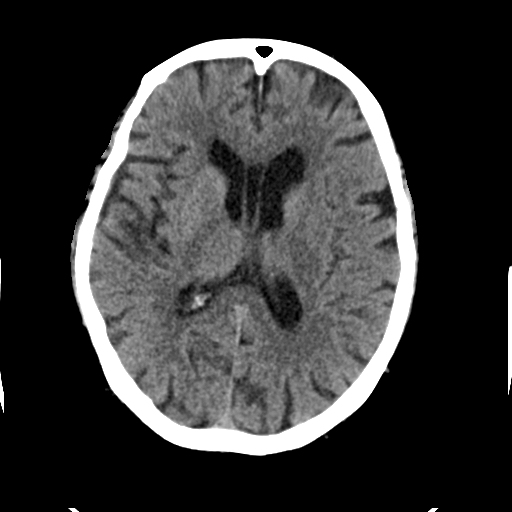
[im 20/37  bone]
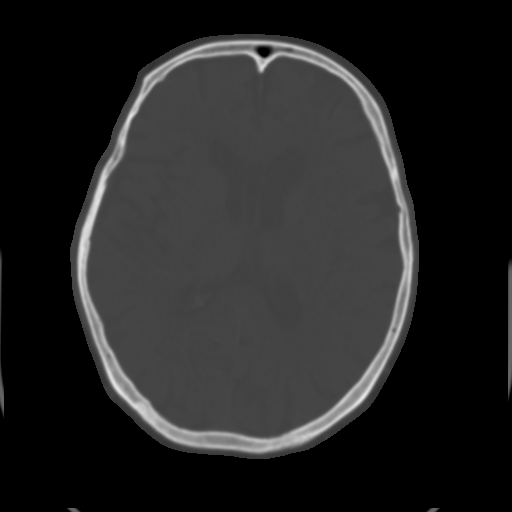
[im 25/37  brain]
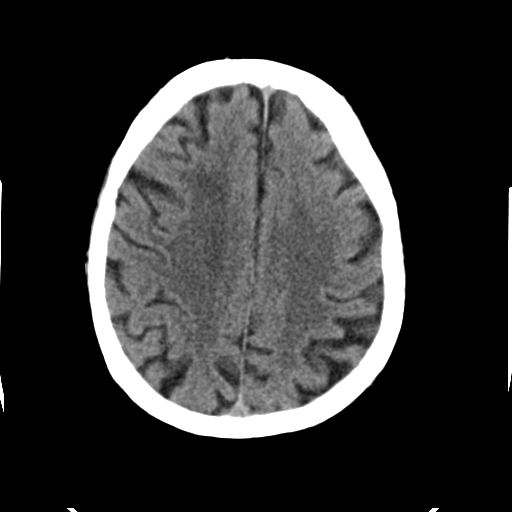
[im 29/37  brain]
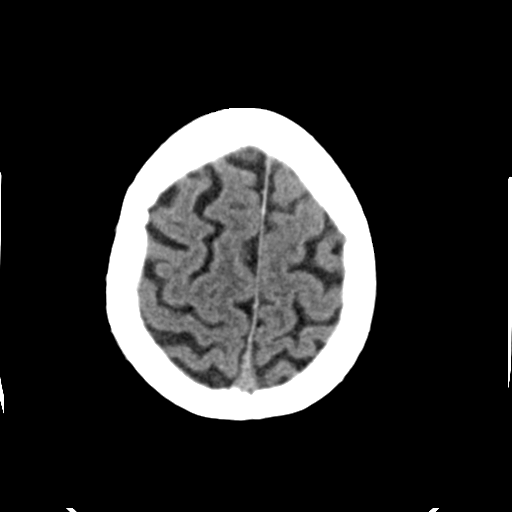
[im 34/37  brain]
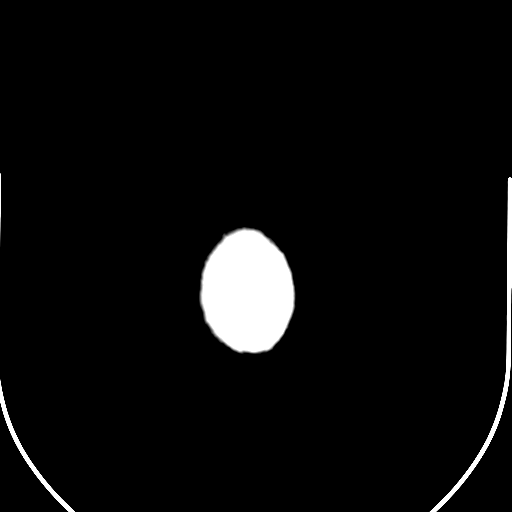

[Series 5: head 3.0 mpr cor · coronal · 0.41mm/px · 3 of 79 slices shown]
[im 27/79  brain]
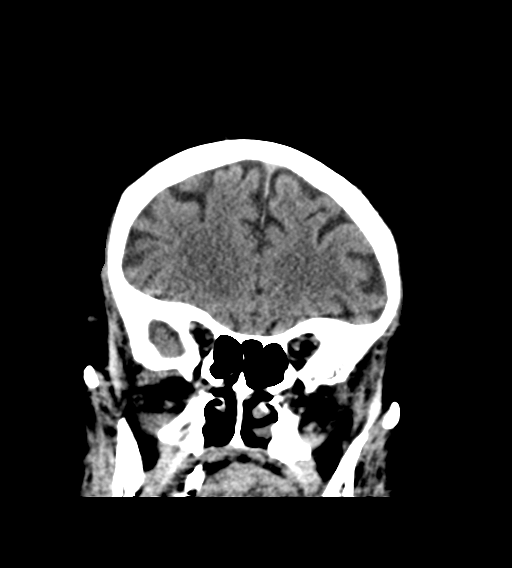
[im 35/79  brain]
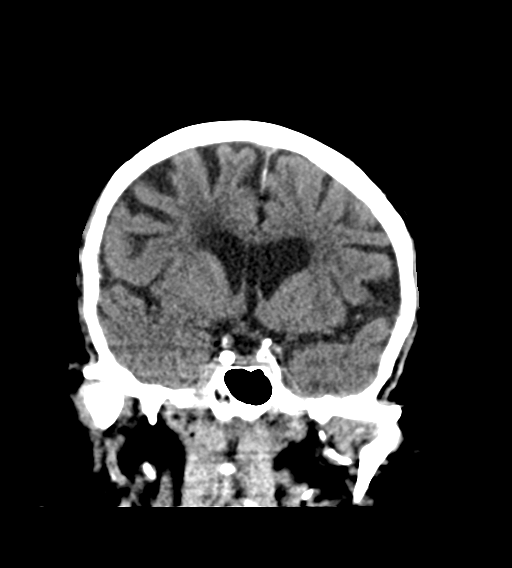
[im 44/79  brain]
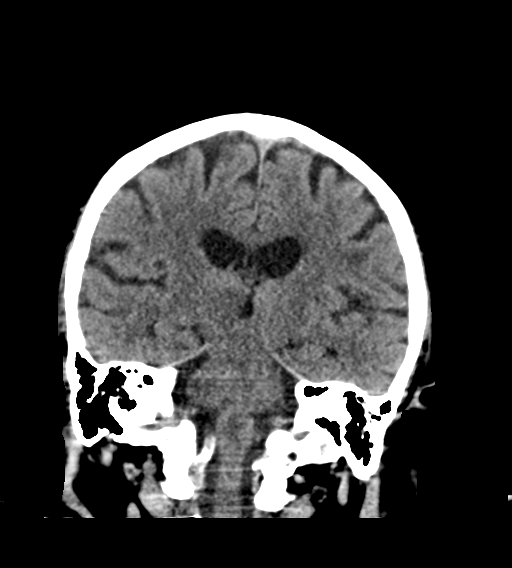

[Series 6: head 3.0 mpr sag · sagittal · 0.35mm/px · 3 of 67 slices shown]
[im 23/67  brain]
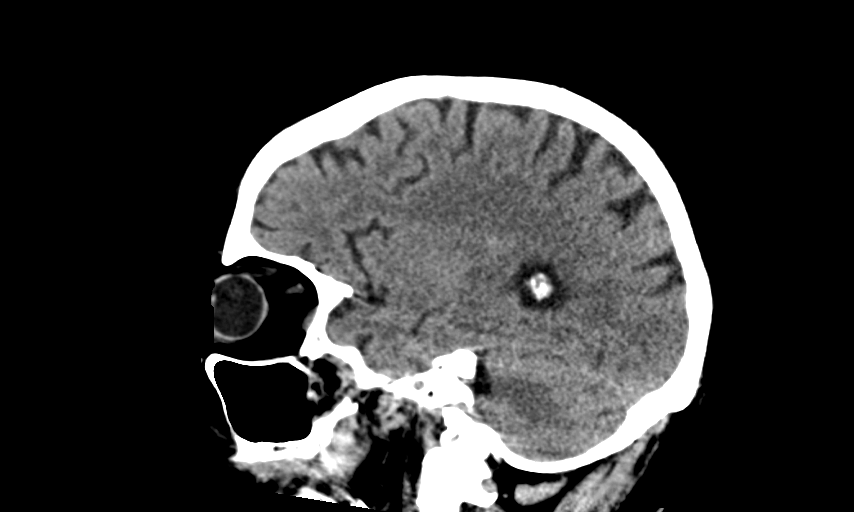
[im 34/67  brain]
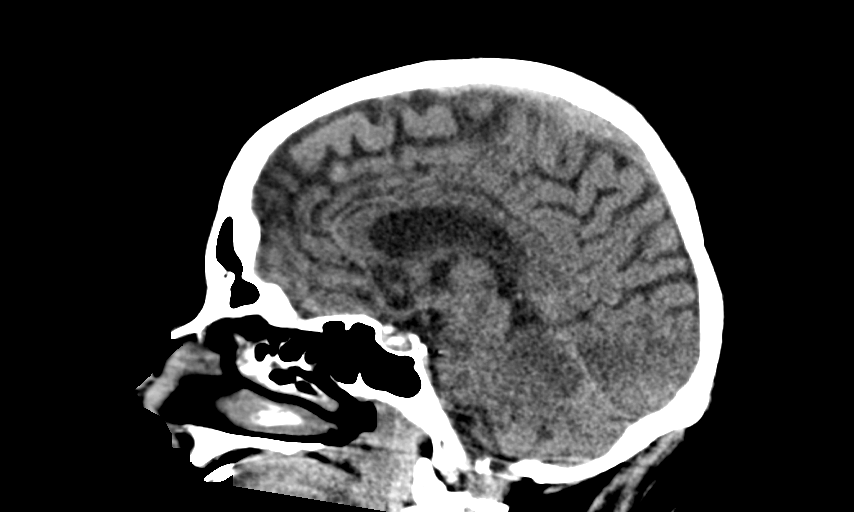
[im 45/67  brain]
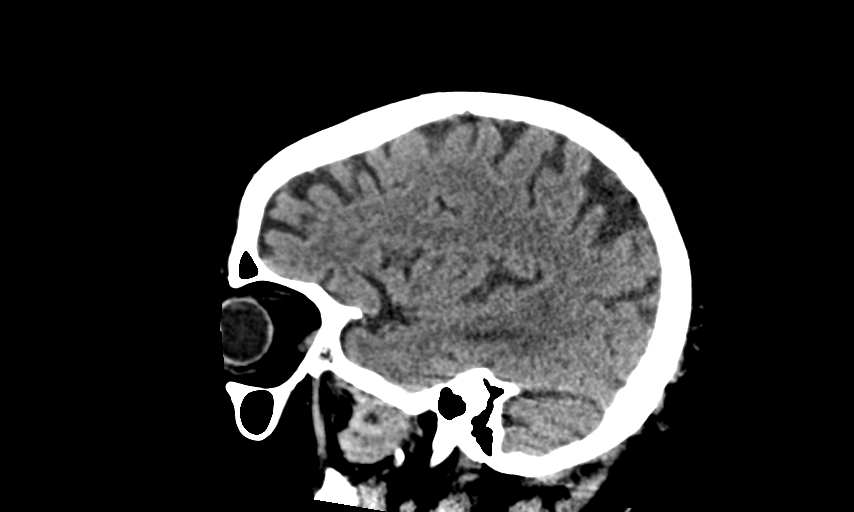

[14 of 47 positions shown; findings below may reference images not displayed]

FINDINGS: Brain: Subacute infarct involving the left occipital lobe, left SCA
distribution of cerebellum, and right thalamus with mild edema and
local mass effect. No associated hemorrhage.

No additional focus of stroke, hemorrhage, or focal mass effect
identified. No extra-axial collection, hydrocephalus, or effacement
of basilar cisterns. Stable background of chronic microvascular
ischemic changes and volume loss of the brain.

Vascular: Tip of basilar to right PCA stent in situ.

Skull: Normal. Negative for fracture or focal lesion.

Sinuses/Orbits: No acute finding.

Other: Bilateral intra-ocular lens replacement.
IMPRESSION: Subacute infarct involving left occipital lobe, left SCA
distribution of cerebellum, and right thalamus. Mild increased edema
and local mass effect. No herniation or associated hemorrhage.

By: [employer] M.D.

## 2019-02-12 IMAGING — DX DG CHEST 1V PORT
1 series · 1 of 1 positions shown · non-contrast
Comparison: 01/01/2018 and older studies.

CLINICAL DATA: Respiratory failure.  Follow-up exam.

EXAM:
PORTABLE CHEST 1 VIEW

[chest]
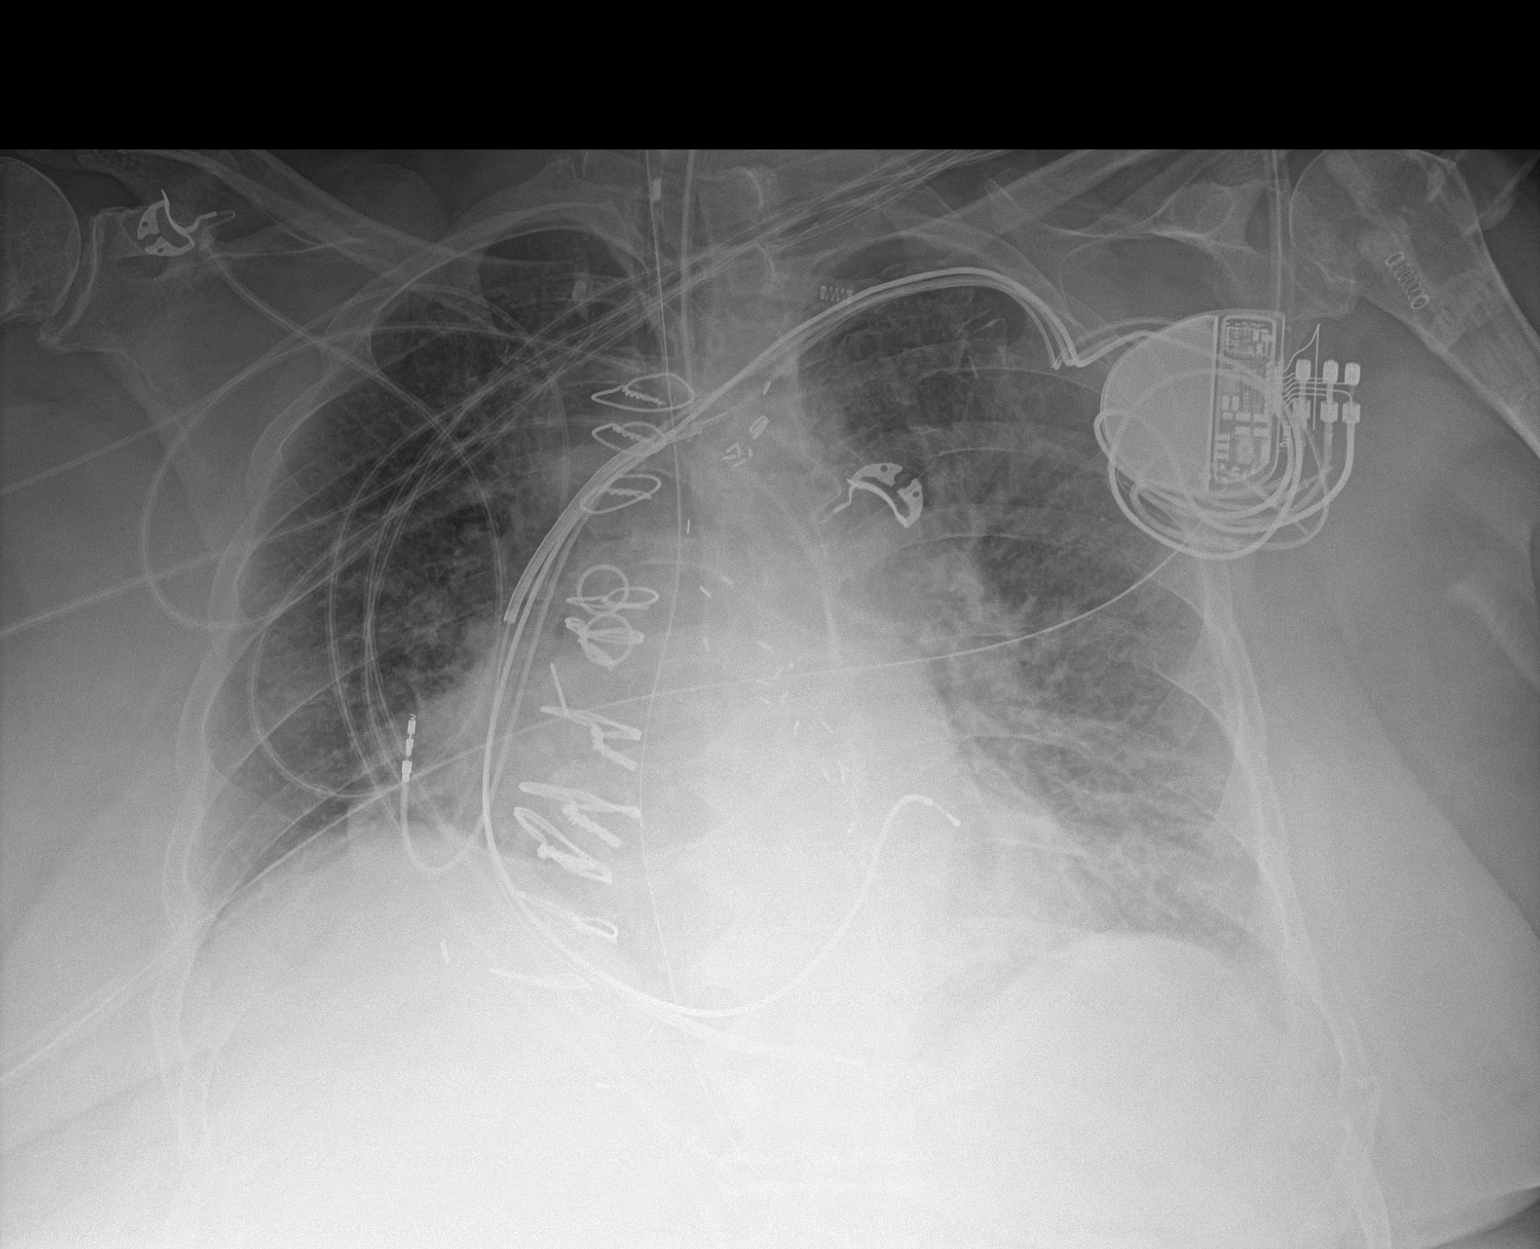

[1 of 1 positions shown; findings below may reference images not displayed]

FINDINGS: Endotracheal tube, nasal/orogastric tube and right PICC stable and
well positioned. No change in the left anterior chest wall
biventricular cardioverter-defibrillator. Stable changes from prior
CABG surgery.

Lungs demonstrate vascular congestion and chronic interstitial
thickening without overt pulmonary edema. Additional basilar opacity
is atelectasis.

No pneumothorax.
IMPRESSION: 1. No significant change from the previous day's study.
2. Chronic interstitial thickening with vascular congestion but no
overt pulmonary edema. Mild basilar atelectasis.
3. Support apparatus is stable and well positioned

## 2019-02-15 IMAGING — DX DG CHEST 1V PORT
1 series · 1 of 1 positions shown · non-contrast
Comparison: 01/03/2018

CLINICAL DATA: Encounter for respiratory failure.

EXAM:
PORTABLE CHEST 1 VIEW

[chest]
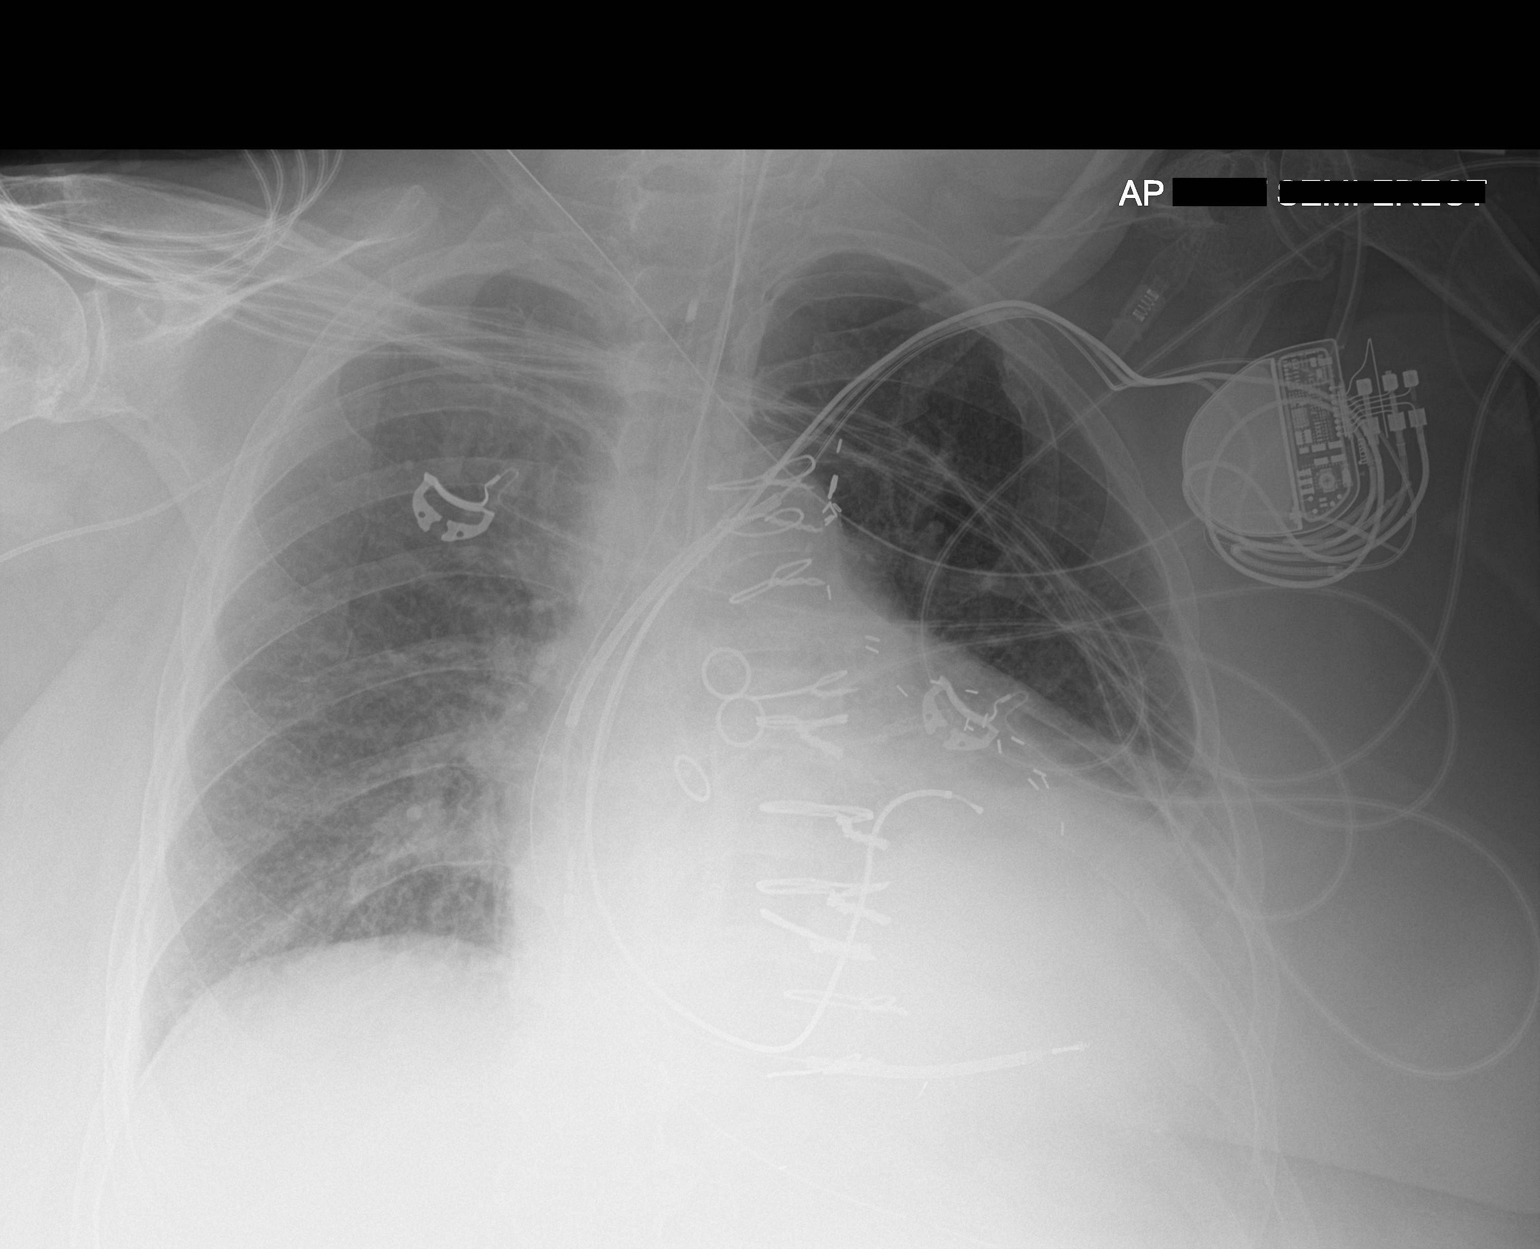

[1 of 1 positions shown; findings below may reference images not displayed]

FINDINGS: Endotracheal tube is roughly 2.2 cm above the carina. Patient is
rotated towards the left. Nasogastric tube extends into the abdomen.
Again noted is a left biventricular cardiac ICD. Densities at the
left lung base are probably accentuated by the patient rotation.
Right arm PICC line extends in the SVC but the tip is not well
visualized on this examination. Negative for a pneumothorax. Prior
CABG procedure with median sternotomy. Again noted is interstitial
prominence suggestive for mild edema or congestion. Heart size
remains enlarged.
IMPRESSION: Limited evaluation of the left lung base due to patient positioning.
Cannot exclude left basilar chest disease.

Stable cardiomegaly with evidence of vascular congestion or mild
interstitial edema.

Support apparatuses as described.

## 2019-02-17 IMAGING — DX DG CHEST 1V PORT
1 series · 1 of 1 positions shown · non-contrast
Comparison: 01/05/2018

CLINICAL DATA: Check endotracheal tube placement

EXAM:
PORTABLE CHEST 1 VIEW

[chest ap]
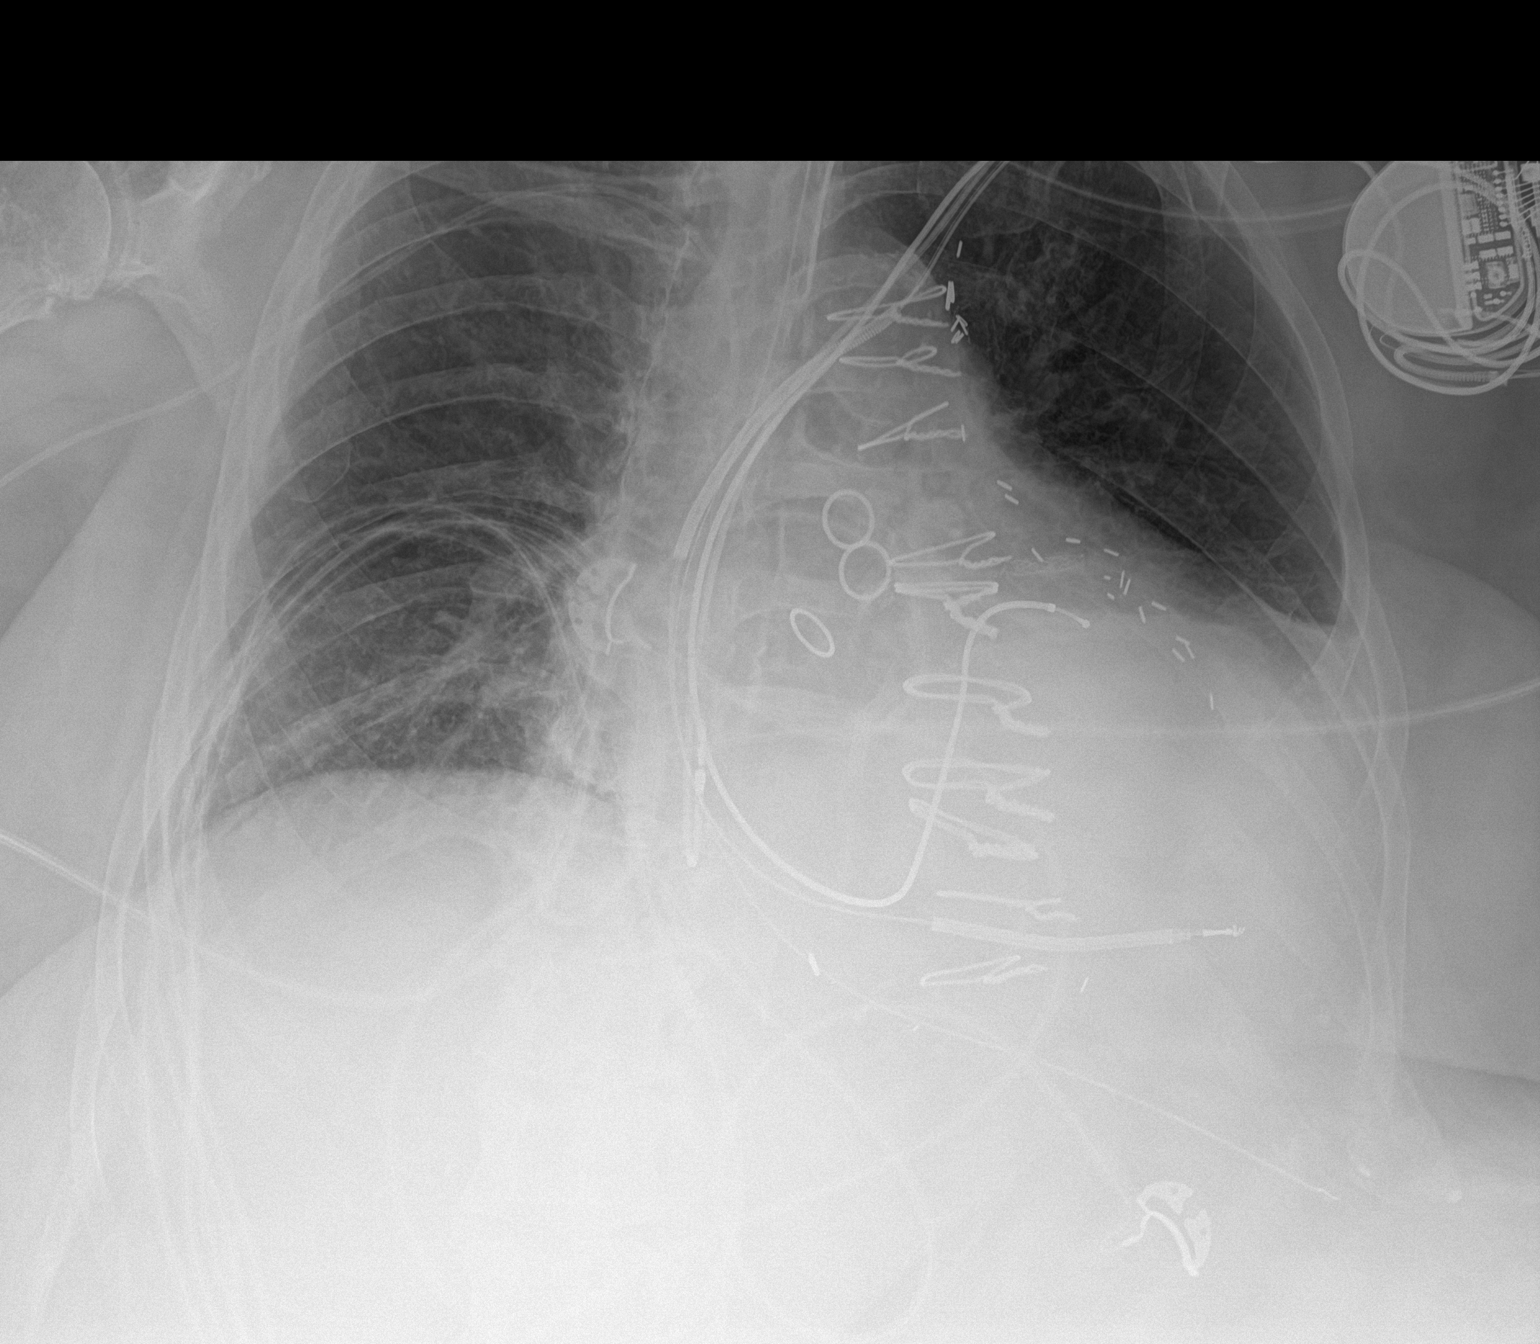

[1 of 1 positions shown; findings below may reference images not displayed]

FINDINGS: Endotracheal tube and nasogastric catheter are noted in satisfactory
position. Right-sided PICC line is noted in the superior vena cava.
Cardiac shadow remains enlarged. Defibrillator is again seen.
Postsurgical changes are again noted. Elevation of the left
hemidiaphragm with left basilar atelectasis is again seen and
stable. Right lung remains clear.
IMPRESSION: Stable tubes and lines as described.

Stable left basilar atelectasis.

## 2019-02-19 IMAGING — DX DG CHEST 1V PORT
1 series · 1 of 1 positions shown · non-contrast
Comparison: January 07, 2018

CLINICAL DATA: Hypoxia

EXAM:
PORTABLE CHEST 1 VIEW

[chest ap]
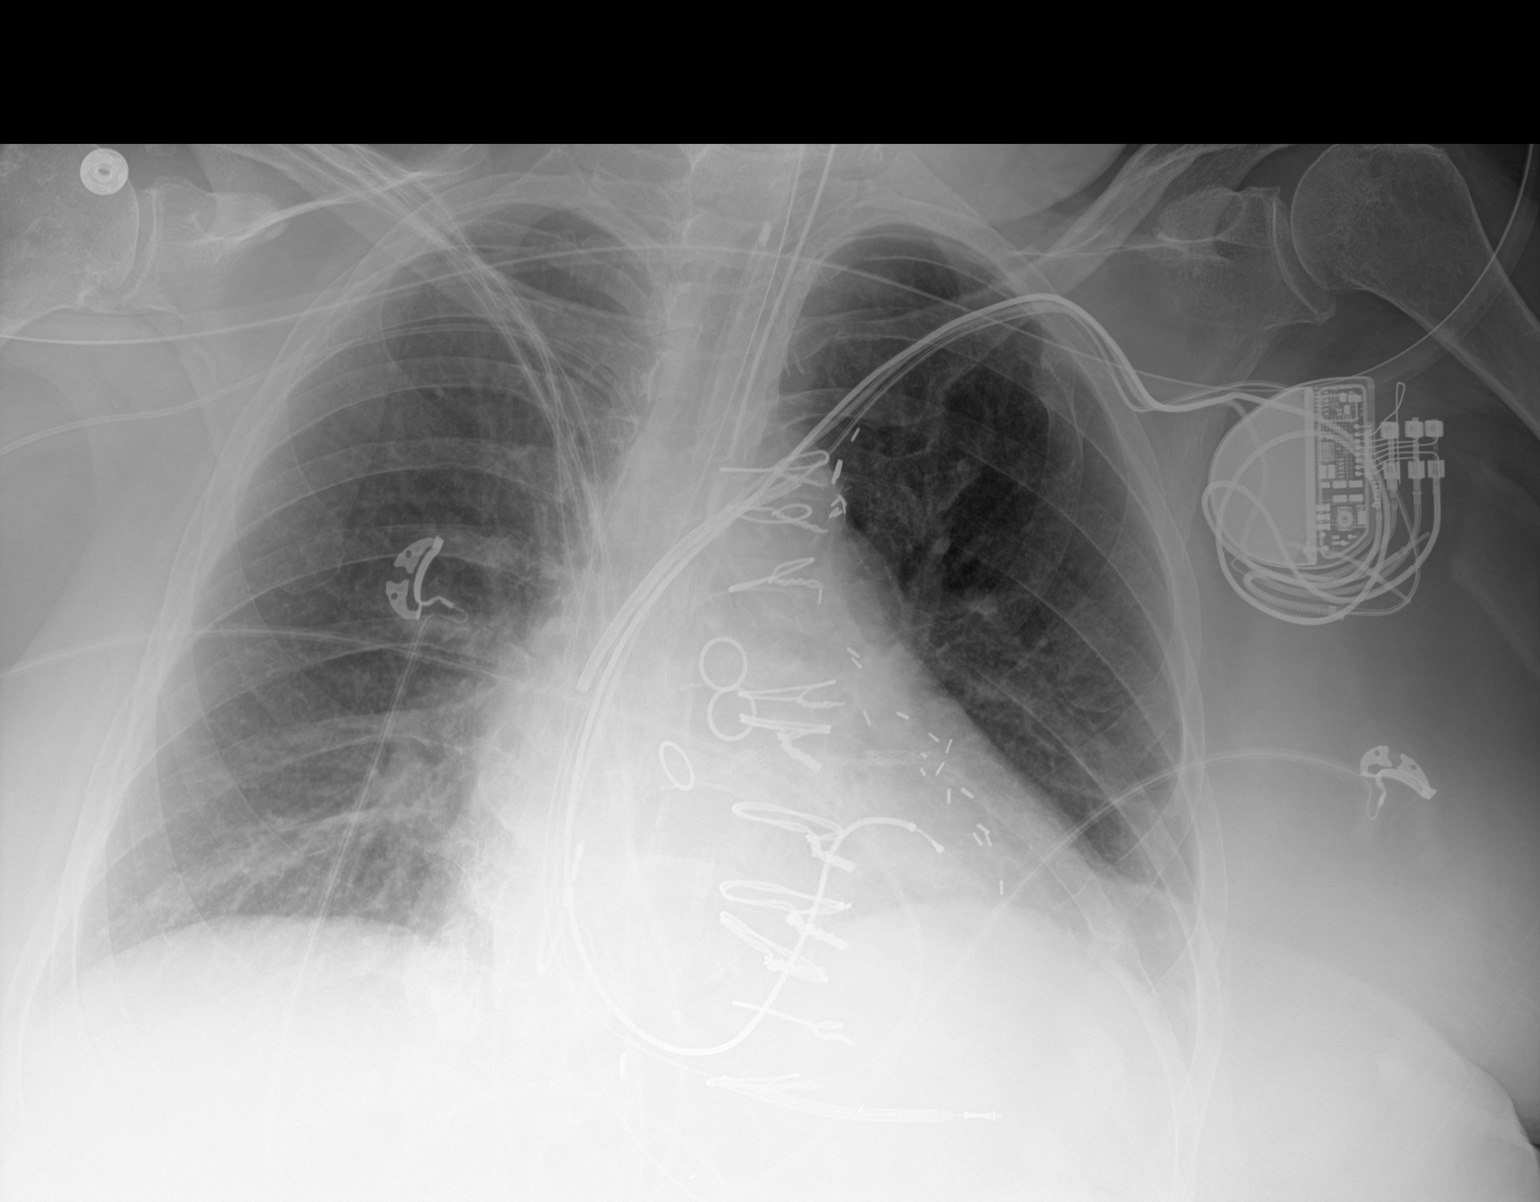

[1 of 1 positions shown; findings below may reference images not displayed]

FINDINGS: Endotracheal tube tip is 3.6 cm above the carina. Nasogastric tube
tip and side port are below the diaphragm. Central catheter tip is
in the superior vena cava. No pneumothorax.

There is a small left pleural effusion. There is mild bibasilar
atelectasis. No consolidation. Heart is upper normal in size with
pulmonary vascularity normal. Pacemaker leads are attached the right
atrium, right ventricle, and coronary sinus. Patient is status post
coronary artery bypass grafting. No adenopathy evident. There is
evidence of an old fracture of the lateral left second rib. There is
aortic atherosclerosis.
IMPRESSION: Tube and catheter positions as described without pneumothorax. Small
left pleural effusion. Bibasilar atelectasis. No consolidation.
Stable cardiac silhouette. There is aortic atherosclerosis.

Aortic Atherosclerosis ([id number]).
